# Patient Record
Sex: Male | Born: 1945 | Race: White | Hispanic: No | Smoking: Former smoker
Health system: Southern US, Community
[De-identification: ages and names within clinical notes are randomized; demographics above are authoritative.]

## PROBLEM LIST (undated history)

## (undated) DIAGNOSIS — J449 Chronic obstructive pulmonary disease, unspecified: Secondary | ICD-10-CM

## (undated) DIAGNOSIS — I2510 Atherosclerotic heart disease of native coronary artery without angina pectoris: Secondary | ICD-10-CM

## (undated) DIAGNOSIS — Z952 Presence of prosthetic heart valve: Secondary | ICD-10-CM

## (undated) DIAGNOSIS — I6529 Occlusion and stenosis of unspecified carotid artery: Secondary | ICD-10-CM

## (undated) DIAGNOSIS — I10 Essential (primary) hypertension: Secondary | ICD-10-CM

## (undated) DIAGNOSIS — D751 Secondary polycythemia: Secondary | ICD-10-CM

## (undated) DIAGNOSIS — R001 Bradycardia, unspecified: Secondary | ICD-10-CM

## (undated) DIAGNOSIS — I255 Ischemic cardiomyopathy: Secondary | ICD-10-CM

## (undated) DIAGNOSIS — Z95810 Presence of automatic (implantable) cardiac defibrillator: Secondary | ICD-10-CM

## (undated) DIAGNOSIS — Z5189 Encounter for other specified aftercare: Secondary | ICD-10-CM

## (undated) DIAGNOSIS — N183 Chronic kidney disease, stage 3 unspecified: Secondary | ICD-10-CM

## (undated) DIAGNOSIS — R55 Syncope and collapse: Secondary | ICD-10-CM

## (undated) DIAGNOSIS — I452 Bifascicular block: Secondary | ICD-10-CM

## (undated) DIAGNOSIS — I38 Endocarditis, valve unspecified: Secondary | ICD-10-CM

## (undated) DIAGNOSIS — Z8679 Personal history of other diseases of the circulatory system: Secondary | ICD-10-CM

## (undated) DIAGNOSIS — N401 Enlarged prostate with lower urinary tract symptoms: Secondary | ICD-10-CM

## (undated) DIAGNOSIS — Z940 Kidney transplant status: Secondary | ICD-10-CM

## (undated) DIAGNOSIS — E785 Hyperlipidemia, unspecified: Secondary | ICD-10-CM

## (undated) DIAGNOSIS — M1A9XX0 Chronic gout, unspecified, without tophus (tophi): Secondary | ICD-10-CM

## (undated) DIAGNOSIS — H409 Unspecified glaucoma: Secondary | ICD-10-CM

## (undated) DIAGNOSIS — N2581 Secondary hyperparathyroidism of renal origin: Secondary | ICD-10-CM

## (undated) DIAGNOSIS — M1990 Unspecified osteoarthritis, unspecified site: Secondary | ICD-10-CM

## (undated) DIAGNOSIS — E119 Type 2 diabetes mellitus without complications: Secondary | ICD-10-CM

## (undated) DIAGNOSIS — N138 Other obstructive and reflux uropathy: Secondary | ICD-10-CM

## (undated) HISTORY — PX: TRANSTHORACIC ECHOCARDIOGRAM: SHX275

## (undated) HISTORY — DX: Chronic obstructive pulmonary disease, unspecified: J44.9

## (undated) HISTORY — DX: Personal history of other diseases of the circulatory system: Z86.79

## (undated) HISTORY — DX: Other obstructive and reflux uropathy: N40.1

## (undated) HISTORY — DX: Secondary polycythemia: D75.1

## (undated) HISTORY — DX: Chronic kidney disease, stage 3 unspecified: N18.30

## (undated) HISTORY — DX: Unspecified osteoarthritis, unspecified site: M19.90

## (undated) HISTORY — DX: Essential (primary) hypertension: I10

## (undated) HISTORY — DX: Syncope and collapse: R55

## (undated) HISTORY — DX: Chronic kidney disease, stage 3 (moderate): N18.3

## (undated) HISTORY — DX: Kidney transplant status: Z94.0

## (undated) HISTORY — DX: Ischemic cardiomyopathy: I25.5

## (undated) HISTORY — DX: Type 2 diabetes mellitus without complications: E11.9

## (undated) HISTORY — DX: Benign prostatic hyperplasia with lower urinary tract symptoms: N13.8

## (undated) HISTORY — DX: Presence of automatic (implantable) cardiac defibrillator: Z95.810

## (undated) HISTORY — DX: Presence of prosthetic heart valve: Z95.2

## (undated) HISTORY — DX: Bradycardia, unspecified: R00.1

## (undated) HISTORY — PX: OTHER SURGICAL HISTORY: SHX169

## (undated) HISTORY — DX: Atherosclerotic heart disease of native coronary artery without angina pectoris: I25.10

## (undated) HISTORY — DX: Secondary hyperparathyroidism of renal origin: N25.81

## (undated) HISTORY — DX: Bifascicular block: I45.2

## (undated) HISTORY — DX: Unspecified glaucoma: H40.9

## (undated) HISTORY — DX: Chronic gout, unspecified, without tophus (tophi): M1A.9XX0

## (undated) HISTORY — DX: Hyperlipidemia, unspecified: E78.5

## (undated) HISTORY — DX: Encounter for other specified aftercare: Z51.89

## (undated) HISTORY — DX: Occlusion and stenosis of unspecified carotid artery: I65.29

---
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
## 1986-06-18 DIAGNOSIS — Z940 Kidney transplant status: Secondary | ICD-10-CM

## 1986-06-18 HISTORY — DX: Kidney transplant status: Z94.0

## 1993-06-18 DIAGNOSIS — Z952 Presence of prosthetic heart valve: Secondary | ICD-10-CM

## 1993-06-18 HISTORY — DX: Presence of prosthetic heart valve: Z95.2

## 1993-06-18 HISTORY — PX: CORONARY ARTERY BYPASS GRAFT: SHX141

## 1993-06-18 HISTORY — PX: AORTIC VALVE REPLACEMENT: SHX41

## 1993-06-18 HISTORY — PX: CARDIAC CATHETERIZATION: SHX172

## 1996-09-10 HISTORY — PX: KIDNEY TRANSPLANT: SHX239

## 1997-09-22 ENCOUNTER — Other Ambulatory Visit: Admission: RE | Admit: 1997-09-22 | Discharge: 1997-09-22 | Payer: Self-pay | Admitting: Nephrology

## 1997-09-27 ENCOUNTER — Other Ambulatory Visit: Admission: RE | Admit: 1997-09-27 | Discharge: 1997-09-27 | Payer: Self-pay | Admitting: Nephrology

## 1997-10-12 ENCOUNTER — Other Ambulatory Visit: Admission: RE | Admit: 1997-10-12 | Discharge: 1997-10-12 | Payer: Self-pay | Admitting: Nephrology

## 1997-10-21 ENCOUNTER — Other Ambulatory Visit: Admission: RE | Admit: 1997-10-21 | Discharge: 1997-10-21 | Payer: Self-pay | Admitting: Nephrology

## 1997-10-28 ENCOUNTER — Other Ambulatory Visit: Admission: RE | Admit: 1997-10-28 | Discharge: 1997-10-28 | Payer: Self-pay | Admitting: Nephrology

## 1997-11-22 ENCOUNTER — Other Ambulatory Visit: Admission: RE | Admit: 1997-11-22 | Discharge: 1997-11-22 | Payer: Self-pay | Admitting: *Deleted

## 1997-12-10 ENCOUNTER — Other Ambulatory Visit: Admission: RE | Admit: 1997-12-10 | Discharge: 1997-12-10 | Payer: Self-pay | Admitting: Nephrology

## 1998-01-07 ENCOUNTER — Other Ambulatory Visit: Admission: RE | Admit: 1998-01-07 | Discharge: 1998-01-07 | Payer: Self-pay | Admitting: *Deleted

## 1998-01-14 ENCOUNTER — Other Ambulatory Visit: Admission: RE | Admit: 1998-01-14 | Discharge: 1998-01-14 | Payer: Self-pay | Admitting: Nephrology

## 2000-03-19 ENCOUNTER — Inpatient Hospital Stay (HOSPITAL_COMMUNITY): Admission: RE | Admit: 2000-03-19 | Discharge: 2000-03-26 | Payer: Self-pay

## 2001-02-11 ENCOUNTER — Encounter: Admission: RE | Admit: 2001-02-11 | Discharge: 2001-02-11 | Payer: Self-pay | Admitting: Nephrology

## 2001-02-11 ENCOUNTER — Encounter: Payer: Self-pay | Admitting: Nephrology

## 2002-11-11 ENCOUNTER — Encounter: Payer: Self-pay | Admitting: Nephrology

## 2002-11-11 ENCOUNTER — Encounter: Admission: RE | Admit: 2002-11-11 | Discharge: 2002-11-11 | Payer: Self-pay | Admitting: Nephrology

## 2008-12-04 ENCOUNTER — Inpatient Hospital Stay (HOSPITAL_COMMUNITY): Admission: EM | Admit: 2008-12-04 | Discharge: 2008-12-12 | Payer: Self-pay | Admitting: Emergency Medicine

## 2008-12-04 ENCOUNTER — Ambulatory Visit: Payer: Self-pay | Admitting: Diagnostic Radiology

## 2008-12-04 ENCOUNTER — Encounter: Payer: Self-pay | Admitting: Emergency Medicine

## 2008-12-04 ENCOUNTER — Ambulatory Visit: Payer: Self-pay | Admitting: Internal Medicine

## 2008-12-07 ENCOUNTER — Encounter: Payer: Self-pay | Admitting: Cardiology

## 2010-02-13 ENCOUNTER — Ambulatory Visit: Payer: Self-pay | Admitting: Cardiology

## 2010-09-01 ENCOUNTER — Ambulatory Visit: Payer: Self-pay | Admitting: Cardiology

## 2010-09-18 ENCOUNTER — Encounter: Payer: Self-pay | Admitting: Cardiology

## 2010-09-18 ENCOUNTER — Ambulatory Visit (INDEPENDENT_AMBULATORY_CARE_PROVIDER_SITE_OTHER): Payer: Self-pay | Admitting: Cardiology

## 2010-09-18 VITALS — BP 120/60 | HR 60 | Wt 199.0 lb

## 2010-09-18 DIAGNOSIS — Z952 Presence of prosthetic heart valve: Secondary | ICD-10-CM

## 2010-09-18 DIAGNOSIS — I359 Nonrheumatic aortic valve disorder, unspecified: Secondary | ICD-10-CM

## 2010-09-19 NOTE — Progress Notes (Signed)
Office Note   Name: John Barton Date of birth: [birth year] Date of service: 09/18/10  History of present illness: This pleasant middle-aged gentleman is seen for a six-month followup office visit.  He has a complex past medical history.  He does have known coronary artery disease.  He underwent CABG to his right coronary artery and to the LAD in 1995 he also underwent replacement of his aortic valve at that time was a St. Jude mechanical valve prosthesis.  In June 2010 he was hospitalized with paroxysmal recurrent supraventricular tachycardia with a wide complex.  He has improved on Quinaglute which is tolerated well.  He's had no recurrent tachycardia.  He's having no dizziness or syncope.  He's not having any chest pain or shortness of breath.  Medications: Reviewed in detail  Physical exam: Weight is 199, down 4 pounds.  The blood pressure is stable.  Pulse is 61.  EKG today confirms normal sinus rhythm with first-degree heart block and bifascicular block and slightly prolonged QTc interval at 459 ms. Pupils equal and reactive.   Extraocular Movements are full.  There is no scleral icterus.  The mouth and pharynx are normal.  The neck is supple.  The carotids reveal no bruits.  The jugular venous pressure is normal.  The thyroid is not enlarged.  There is no lymphadenopathy. The chest is clear to percussion and auscultation. There are no rales or rhonchi. Expansion of the chest is symmetrical. The precordium is quiet.  The first heart sound is normal.  The second heart sound is physiologically split.  There is no gallop.  Patient has good opening closing aortic valve clicks.  No aortic insufficiency murmur.  There is no abnormal lift or heave. The abdomen is soft and nontender. Bowel sounds are normal. The liver and spleen are not enlarged. There Are no abdominal masses. There are no bruits. The pedal pulses are good.  There is no phlebitis or edema.  There is no cyanosis or clubbing.  Impression  : Ischemic heart disease status post CABG St. Jude aortic valve prosthesis. Past history of supraventricular tachycardia Bifascicular block Status post kidney transplant for end-stage renal disease, doing well History of dyslipidemia  Plan: Continue same medications recheck in 6 months for followup office visit.   Signed by Darlin Coco, M.D.  cc:[default value]

## 2010-09-25 LAB — URINALYSIS, ROUTINE W REFLEX MICROSCOPIC
Glucose, UA: NEGATIVE mg/dL
Specific Gravity, Urine: 1.015 (ref 1.005–1.030)
pH: 6 (ref 5.0–8.0)

## 2010-09-25 LAB — CBC
HCT: 42.9 % (ref 39.0–52.0)
Hemoglobin: 14.6 g/dL (ref 13.0–17.0)
MCHC: 34.1 g/dL (ref 30.0–36.0)
RDW: 13.6 % (ref 11.5–15.5)

## 2010-09-25 LAB — PROTIME-INR
INR: 2.3 — ABNORMAL HIGH (ref 0.00–1.49)
INR: 2.3 — ABNORMAL HIGH (ref 0.00–1.49)
INR: 3.3 — ABNORMAL HIGH (ref 0.00–1.49)
INR: 3.8 — ABNORMAL HIGH (ref 0.00–1.49)
INR: 3.3 — ABNORMAL HIGH (ref 0.00–1.49)
Prothrombin Time: 36.5 seconds — ABNORMAL HIGH (ref 11.6–15.2)
Prothrombin Time: 39.1 seconds — ABNORMAL HIGH (ref 11.6–15.2)
Prothrombin Time: 35.8 seconds — ABNORMAL HIGH (ref 11.6–15.2)

## 2010-09-25 LAB — DIFFERENTIAL
Basophils Absolute: 0.1 10*3/uL (ref 0.0–0.1)
Basophils Relative: 2 % — ABNORMAL HIGH (ref 0–1)
Eosinophils Relative: 0 % (ref 0–5)
Lymphocytes Relative: 5 % — ABNORMAL LOW (ref 12–46)
Monocytes Absolute: 0.6 10*3/uL (ref 0.1–1.0)
Monocytes Relative: 7 % (ref 3–12)

## 2010-09-25 LAB — BASIC METABOLIC PANEL
CO2: 29 mEq/L (ref 19–32)
CO2: 28 mEq/L (ref 19–32)
Calcium: 9.5 mg/dL (ref 8.4–10.5)
Chloride: 104 mEq/L (ref 96–112)
Chloride: 103 mEq/L (ref 96–112)
Creatinine, Ser: 1.41 mg/dL (ref 0.4–1.5)
GFR calc Af Amer: >60 mL/min (ref >60)
Glucose, Bld: 107 mg/dL — ABNORMAL HIGH (ref 70–99)
Glucose, Bld: 139 mg/dL — ABNORMAL HIGH (ref 70–99)
Potassium: 3.9 mEq/L (ref 3.5–5.1)
Potassium: 4.2 mEq/L (ref 3.5–5.1)
Sodium: 137 mEq/L (ref 135–145)
Sodium: 140 mEq/L (ref 135–145)

## 2010-09-25 LAB — T4: T4, Total: 6.7 ug/dL (ref 5.0–12.5)

## 2010-09-25 LAB — POCT CARDIAC MARKERS
Myoglobin, poc: 101 ng/mL (ref 12–200)
Troponin i, poc: <0.05 ng/mL (ref 0.00–0.09)

## 2010-09-25 LAB — LIPID PANEL
Cholesterol: 150 mg/dL (ref 0–200)
Total CHOL/HDL Ratio: 5.4 RATIO

## 2010-09-25 LAB — COMPREHENSIVE METABOLIC PANEL
ALT: 24 U/L (ref 0–53)
AST: 26 U/L (ref 0–37)
Albumin: 3.8 g/dL (ref 3.5–5.2)
Alkaline Phosphatase: 83 U/L (ref 39–117)
BUN: 31 mg/dL — ABNORMAL HIGH (ref 6–23)
Chloride: 103 mEq/L (ref 96–112)
GFR calc Af Amer: 47 mL/min — ABNORMAL LOW (ref >60)
Potassium: 4.9 mEq/L (ref 3.5–5.1)
Sodium: 138 mEq/L (ref 135–145)
Total Bilirubin: 0.7 mg/dL (ref 0.3–1.2)

## 2010-09-25 LAB — MAGNESIUM: Magnesium: 2 mg/dL (ref 1.5–2.5)

## 2010-09-25 LAB — BRAIN NATRIURETIC PEPTIDE: Pro B Natriuretic peptide (BNP): 72 pg/mL (ref 0.0–100.0)

## 2010-09-25 LAB — TROPONIN I: Troponin I: 0.04 ng/mL (ref 0.00–0.06)

## 2010-09-25 LAB — CK TOTAL AND CKMB (NOT AT ARMC): Relative Index: 1.9 (ref 0.0–2.5)

## 2010-09-25 LAB — URINE MICROSCOPIC-ADD ON

## 2010-09-25 LAB — URIC ACID: Uric Acid, Serum: 6.4 mg/dL (ref 4.0–7.8)

## 2010-10-12 ENCOUNTER — Encounter: Payer: Self-pay | Admitting: Nephrology

## 2010-10-31 NOTE — Consult Note (Signed)
NAME:  John Barton, John Barton NO.:  [id number]   MEDICAL RECORD NO.:  [id number]          PATIENT TYPE:  INP   LOCATION:  2004                         FACILITY:  [facility]   PHYSICIAN:  Deboraha Sprang, MD, FACCDATE OF BIRTH:  09-May-1946   DATE OF CONSULTATION:  12/06/2008  DATE OF DISCHARGE:                                 CONSULTATION   Thank you very much for asking [place] to see in consultation, John Barton because of episodes of wide-complex tachycardia.   John Barton is a 65 year old gentleman with a complex past medical  history including a history of renal failure status post living related  donor transplantation, currently on CellCept; aortic valve replacement  for aortic stenosis with which, the patient says he also had a single-  vessel bypass; history of normal left ventricular function, who over the  last number of weeks started having spells of abrupt onset  lightheadedness that were quite transient lasting less than 5 seconds.  He thought they were initially related to hypoglycemia.  They persisted  infrequently until Friday.  He had mowed the lawn for about an hour  prior to this.  He then had a series of  a half dozen spells occurring  while upright and while seated having eaten and not having eaten.  Because of persistent symptoms on Saturday, he went to the [facility]  Emergency Room at [facility] where clinical correlation of these  symptoms was associated with runs of tachycardia.  The strips of which  are not available.  15-20 beats are described and these episodes  recurred in the hospital here at [facility], as recently as this morning.   He has no significant palpitations associated with it.  The episodes in  hospital here lasted 15-20 beats.   Initially, he was treated with amiodarone and beta-blockers.  Unfortunately, this was associated with heart rates in the 40s and the  amiodarone was then discontinued and Lopressor was continued.   Other  medications include:  1. Lisinopril 10.  2. CellCept 1000 b.i.d.  3. Warfarin.  4. Lasix 40.  5. Allopurinol 100/200.  6. Lipitor 80.   He has no known drug allergies.   SOCIAL HISTORY:  He is divorced.  He lives by himself.  He does not use  recreational drugs or cigarettes.  He does drink alcohol occasionally.   His family history is noncontributory.   His review of systems is noted on the intake sheet and apart from the  HPI and PMH is broadly negative.   PHYSICAL EXAMINATION:  GENERAL:  He is an older Caucasian male appearing  somewhat older than his stated age of 66.  VITAL SIGNS:  His blood pressure is 120/65 with a pulse of 48.  His  respirations were 16 and unlabored.  HEENT:  Demonstrated no icterus or xanthomata.  NECK:  His neck veins were flat with bilaterally transmitted bruits  presumed).  BACK:  Without kyphosis or scoliosis.  LUNGS:  Clear.  HEART:  Sounds were regular from mechanical S2 and a 2/6 murmur heard  along the right upper sternal  border.  ABDOMEN:  Soft with active bowel sounds without midline pulsation or  hepatomegaly.  EXTREMITIES:  Femoral pulses were 2+.  Distal pulses were intact.  There  was no clubbing, cyanosis, or edema.  SKIN:  Warm and dry.  NEUROLOGIC:  Grossly normal.   Telemetry strips are as recorded above.  Interestingly, there is a  change in the appearance of his inferior leads during tachycardia with a  further widening suggesting an aberration perhaps in the left anterior  fascicle to match the right bundle branch block.   Electrocardiogram at rest dated December 04, 2008, demonstrated sinus rhythm  at 72 with intervals of 0.20/0.15/0.46.   IMPRESSION:  1. Atrial tachycardia - nonsustained with some degree of rate-related      aberration probably in the left anterior fascicle.  2. Presyncope associated with atrial tachycardia likely secondary to      vasodepression, mediated autonomically.  3. Modest bradycardia associated  with amiodarone.  4. Right bundle branch block.  5. Borderline QT prolongation.  6. Status post aortic valve replacement and question one-vessel      coronary artery bypass grafting.  7. Normal left ventricular function.  8. Status post living related donor renal transplant with a glomerular      filtration rate of 56.   DISCUSSION:  John Barton has vasomotor instability triggered by atrial  tachycardia.  The therapeutic target needs to be the arrhythmia.  Given  his relative infrequent, I am not sure that catheter ablation makes  sense as a first approach, although medical options are not great.  The  first point of clarification has to be whether he does in fact have  coronary artery disease, as IC agent might be particularly useful here.  Myoview scanning would be helpful in the event that he does not have  declared coronary artery disease.   In the event that he does, then the options are much more limited.  Both  amiodarone and Dronedarone are associated with potential for  bradycardia.  Sotalol and Tikosyn both are of some concern given his  borderline QT interval.  It does not appear that beta-blockers in  themselves will be sufficient.  Alternatively, a calcium blocker might  be.  There will be a question, I would like to have Dr. Jimmy Footman to  make sure there is renal contraindications to such use.   A lot hinges, and certainly the ease of the decision hinges on whether  he does have coronary artery disease or not, so I would for now  recommend:  1. Clarifying the history of his surgery.  2. Undertake Myoview scanning.  3. Consider IC therapy.  4. In the event that one tolerate this would probably favor Tikosyn.  5. We will review with colleagues the value of potentially ablating      this.  Endocardial mapping with the ESI array might facilitate      mapping of the very short bursts.   Thank you for the consultation.      Deboraha Sprang, MD, [facility]  Electronically  Signed     SCK/MEDQ  D:  12/06/2008  T:  12/07/2008  Job:  [id number]   cc:   Darlin Coco, M.D.  James L. Deterding, M.D.

## 2010-10-31 NOTE — H&P (Signed)
NAME:  John Barton, CRASK NO.:  [id number]   MEDICAL RECORD NO.:  [id number]          PATIENT TYPE:  INP   LOCATION:  2922                         FACILITY:  [place]   PHYSICIAN:  Darlin Coco, M.D. DATE OF BIRTH:  1946/02/19   DATE OF ADMISSION:  12/04/2008  DATE OF DISCHARGE:                              HISTORY & PHYSICAL   CHIEF COMPLAINT:  Dizzy spells.   HISTORY:  This is a 65 year old Caucasian male who is admitted with  recurrent long 20-beat runs of wide complex tachycardia associated with  dizziness and near syncope.  He has been experiencing similar spells  over the past several months, but felt that they were just attributed to  low blood sugar attacks, and did not pursue them.  Yesterday, he did  some yard work.  He drank plenty of Gatorade while he was working to  prevent dehydration.  Approximately 2 hours after he had finished  working, he was preparing supper and began having episodes of waves of  dizziness.  He estimates that between supper time and bedtime, he had  about 7 such episodes.  He went to bed.  There was no chest pain with  any of these.  He was not short of breath.  He was not diaphoretic or  nauseated.  This morning, he had another episode and decided to go to  the emergency room at the [facility].  He was worked up there  and telemetry showed that he was having recurrent episodes of wide  complex tachycardia at a rate of 150 per minute.  The QRS morphology was  the same morphology as his native beats however.  He was felt to be  having V-TACH and was sent to [facility] where he was evaluated and admitted.  All of his labs at the outside emergency room were normal including  therapeutic INR, cardiac enzymes, electrolytes, magnesium.  Chest x-ray  was also unremarkable except for mild cardiomegaly.  His  electrocardiogram did not show any acute ST-T wave abnormalities, but he  does have a bifascicular block.   PAST MEDICAL  HISTORY:  He had a St. Jude aortic valve replacement for  aortic stenosis on Oct 25, 1993.  He had kidney transplant for chronic  end-stage renal disease on September 10, 1996.  He is followed by Dr.  Jimmy Footman.  He had parathyroid surgery in 2001.   HOME MEDICATIONS:  1. Allopurinol 100 mg 3 daily.  2. Calcium 500 mg daily.  3. Lasix 40 mg daily.  4. Lipitor 80 mg daily.  5. Lisinopril 10 mg daily.  6. CellCept 500 mg 2 twice a day.  7. Calcitriol 0.5 mg daily.   FAMILY HISTORY:  His mother is living and well.  His father died of  heart failure at 47.   SOCIAL HISTORY:  He does not use any cigarettes.  He has an occasional  cigar and drinks an occasional beer.   REVIEW OF SYSTEMS:  Otherwise negative in detail.  He is not having any  new gastrointestinal or genitourinary symptoms.  No cough or sputum  production or  hemoptysis or pleurisy.   He has no known drug allergies.   PHYSICAL EXAMINATION:  VITAL SIGNS:  Blood pressure is 196/80, pulse is  80 and normal sinus rhythm.  He is now on an IV amiodarone drip and he  has not had any further SVT or wide complex tachycardia.  GENERAL APPEARANCE:  Apprehensive, somewhat frightened middle-aged  gentleman in no acute distress.  He is deeply tanned.  HEENT:  Unremarkable.  Pupils equal and reactive.  Sclerae nonicteric.  Mouth and pharynx negative.  Carotids negative.  Jugular venous pressure  normal.  CHEST:  Clear to percussion and auscultation.  HEART:  Grade 2/6 systolic ejection murmur at the base.  ABDOMEN:  Soft and nontender.  No hepatomegaly.  EXTREMITIES:  No edema.  He has 1+ pedal pulses.  NEUROLOGIC:  Physiologic.  SKIN:  Negative for rash.   As noted already, the outside labs were normal including cardiac  enzymes.   Chest x-ray shows cardiomegaly, but clear lungs.   His EKG shows normal sinus rhythm with a pattern of a right bundle-  branch block and a left anterior hemiblock.   IMPRESSION:  1. Paroxysmal wide  complex tachycardia, possibly supraventricular      tachycardia versus ventricular tachycardia.  These episodes are      associated with feelings of dizziness, weakness, and near syncope.  2. Hypertensive cardiovascular disease.  3. Bifascicular block.  4. Status post renal transplant.  5. Status post St. Jude aortic valve replacement.  6. Chronic Coumadin anticoagulation.   DISPOSITION:  Admit to stepdown unit.  IV amiodarone was started in the  emergency room and will continue it overnight.  We are going to add beta-  blocker to his regimen to help with systolic hypertension as well as to  help with his arrhythmia.  We will get serial enzymes.  We will strongly  consider EP consult when available.           ______________________________  Darlin Coco, M.D.     TB/MEDQ  D:  12/04/2008  T:  12/05/2008  Job:  [id number]   cc:   Dr. Jimmy Footman

## 2010-11-03 NOTE — Discharge Summary (Signed)
NAME:  John Barton, John Barton NO.:  [id number]   MEDICAL RECORD NO.:  [id number]          PATIENT TYPE:  INP   LOCATION:  2004                         FACILITY:  [place]   PHYSICIAN:  Darlin Coco, M.D. DATE OF BIRTH:  [birth year]   DATE OF ADMISSION:  12/04/2008  DATE OF DISCHARGE:  12/12/2008                               DISCHARGE SUMMARY   FINAL DIAGNOSES:  1. Paroxysmal supraventricular tachycardia with aberration.  2. Dizziness and near syncope secondary to arrhythmia.  3. Status post kidney transplant with normal kidney function.  4. Status post St. Jude aortic valve replacement and coronary artery      bypass graft surgery on Oct 25, 1993.  5. Hypertensive cardiovascular disease.  6. Bifascicular block.  7. 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   OPERATIONS PERFORMED:  Echocardiography and stress Myoview.   HISTORY:  This is a 65 year old Caucasian male who was admitted through  the emergency room on December 04, 2008.  He came in after experiencing a  long 20-beat runs of wide complex tachycardia associated with dizziness  and near syncope.  He has been experiencing similar spells over the past  several months, which he had been attributing to possible low blood  sugar attacks.  On the day prior to admission, he did some yard work and  although he was drinking plenty of Gatorade to prevent dehydration;  after working 2 hours, he began having waves of severe dizziness as he  was preparing supper.  He had about seven such episodes between supper  and bedtime.  He did not have any chest pain and he was not short of  breath.  There was no nausea or diaphoresis.  On the morning of  admission, he had another episode, and he went to emergency room on [address] where he was found to be having recurrent episodes of wide  complex tachycardia at a rate of 150 per minute.  He was thought at that  time to be having ventricular  tachycardia and was sent to [facility] Emergency  Room where he was evaluated and admitted.  Subsequent evaluation  revealed that the QRS morphology is the same morphology as his native  beats suggesting that this is not ventricular tachycardia but merely  supraventricular tachycardia with a preexisting interventricular  conduction disturbance.  Chest x-ray in the emergency room showed mild  cardiomegaly.  Of note is the fact that he had St. Jude aortic valve  replacement for aortic stenosis as well as coronary artery bypass graft  surgery on Oct 25, 1993.  He had kidney transplant successfully for end-  stage renal disease on September 10, 1996.  In 2001, he had parathyroid  surgery.   HOME MEDICATIONS AT THE TIME OF ADMISSION:  1. Allopurinol 100 mg three times a day.  2. Calcium 500 mg daily.  3. Furosemide 40 mg daily.  4. Lipitor 80 mg daily.  5. Lisinopril 10 mg daily.  6. CellCept 500 mg two twice a day.  7. Calcitriol 0.5 mg daily.   PHYSICAL EXAMINATION:  VITAL SIGNS:  Initially, his blood pressure is  196/80, pulse is 80 in normal sinus rhythm.  GENERAL:  This is an apprehensive gentleman in no distress.  HEENT:  Head and neck unremarkable.  CHEST:  Clear.  HEART:  Good opening and closing aortic valve clicks with a grade 2/6  systolic ejection murmur at the base.  ABDOMEN:  Soft and nontender.  EXTREMITIES:  No phlebitis.  He has 1+ pedal pulses.  NEUROLOGIC:  Physiologic.   Chest x-ray shows cardiomegaly, but clear lung fields.   EKG shows a pattern of normal sinus rhythm with a right bundle-branch  block with a left anterior hemiblock.   HOSPITAL COURSE:  The patient was admitted to step-down.  He had been  started on IV amio in the emergency room, which was continued overnight.  However, the IV amio had to be stopped because of subsequent marked  sinus bradycardia.  Because of his sinus bradycardia, we also cut back  on his Lopressor dose.  Cardiac enzymes were negative.   B-natriuretic  peptide was normal.  His INR was therapeutic at 3.3.  Thyroid function  was normal and his hemoglobin A1c was normal.  He was transferred to  telemetry on December 05, 2008.  On the morning of December 06, 2008, he had  another run of wide complex tachycardia.  Dr. Jolyn Nap was asked to  see the patient for EP consult.  Dr. Caryl Comes thought that it was important  to clarify whether the patient was having any ischemic heart disease.  Old records revealed that he had had saphenous vein graft to the right  coronary artery and to the LAD at the time of his aortic valve  replacement.  We did a stress Myoview on December 07, 2008.  The patient  walked 5 minutes 45 seconds, did not have any chest pain and had only  moderate dyspnea, and he did not have any arrhythmias during the test.  Subsequent images showed no reversible ischemia and his ejection  fraction was 47%.  A two-dimensional echocardiogram showed good left  ventricular systolic function with an EF of 50-55% and his prosthetic  aortic valve was functioning normally.  We initially added Rythmol to  his regimen.  However, it was felt that the Rythmol would probably not  be the drug of choice for long term because of history of prior coronary  artery disease and his history of bradycardia.  Therefore, the patient  was felt to be a candidate for Tikosyn or Quinaglute.  Because of major  financial concerns, we elected to go with Quinaglute 324 twice a day.  The patient tolerated that dose well and was observed over the next 72  hours, and he had no further arrhythmias and was having no side effects  and was able to be discharged home improved.   DISCHARGE MEDICATIONS:  Low-sodium heart-healthy diet.  He will be on  lisinopril 10 mg one daily; CellCept 500 mg 2 tablets twice a day;  calcium daily; furosemide 40 mg daily; calcitriol 0.5 mg daily;  allopurinol 100 mg daily; Lipitor 80 mg daily; Coumadin 7.5 mg Monday,  Wednesday, Friday,  and Sunday and 10 mg Tuesday, Thursday, and Saturday.  He is no longer on metoprolol.  He is starting on Quinaglute 324 one  twice a day.   Condition on discharge is improved.   The patient will be followed up in 1 week in Dr. Sherryl Barters office.  ______________________________  Darlin Coco, M.D.     TB/MEDQ  D:  01/17/2009  T:  01/18/2009  Job:  [id number]   cc:   Deboraha Sprang, MD, [facility]  Dr. Jimmy Footman

## 2010-11-03 NOTE — Op Note (Signed)
[place]  Patient:    John Barton, John Barton                        MRN: [id number] Proc. Date: 03/21/00 Adm. Date:  [id number] Attending:  Allyn Kenner CC:         Joyice Faster. Deterding, M.D.   Operative Report  CENTRAL [place] NUMBER:  [id number]  PREOPERATIVE DIAGNOSIS:  Tertiary hyperparathyroidism.  POSTOPERATIVE DIAGNOSIS:  Tertiary hyperparathyroidism.  OPERATION:  Total parathyroidectomy and autotransplantation of parathyroid tissue to the left brachioradialis muscle.  SURGEON:  Jaci Carrel, M.D.  ASSISTANT:  Edsel Petrin. Dalbert Batman, M.D.  ANESTHESIA:  General endotracheal.  DESCRIPTION OF PROCEDURE:  Under adequate general endotracheal anesthesia the patients neck was prepared and draped in the usual fashion.  A low collar incision was made and carried down through the platysma muscle. Superior and inferior platysma flaps were developed using Bovie electrocoagulation.  The middle cervical fascia was exposed.  A Mahorner self-retaining retractor was inserted.  The middle cervical fascia was incised longitudinally in the midline and the strap muscles were divided bilaterally in the upper portions using Bovie electrocoagulation.  The surgical plane of the thyroid was entered.  The left side was approached first.  The left lobe of the thyroid was dissected away from the carotid sheath and rotated anteriorly and medially.  Exploration of the left side revealed 2 enlarged parathyroid glands in their usual positions. They were each removed over small hemoclips.  The clipped ______ parathyroid gland was 1.6 cm in length, 1.0 cm in width, and 0.2 cm in thickness.  A portion was sent for frozen section study which confirmed parathyroid tissue and the remainder was saved in iced saline.  The left superior gland was 1.5 cm in length, 0.6 cm in width, and 0.4 cm in thickness.  A portion was sent for frozen section study and the remainder was  saved in iced saline.  There did not appear to be any more parathyroid tissue on the left side and the left mediastinum was unable to be explored without considerable dissection because of the patients previous coronary artery bypass surgery.  We then went over to the right side, and divided a large middle thyroid vein in order to give mobility to the right lobe of the thyroid.  The right inferior parathyroid gland was quite a bit enlarged but in its usual position. It was dissected out over small hemoclips and removed.  The right inferior was 2 cm in length, 1.2 in width, and 0.8 cm in thickness.  A portion was sent for frozen section study which confirmed parathyroid tissue and the remainder was saved in iced saline.  The right superior was enlarged and the major component of it was posterior but it was attached in its usual position just above the inferior thyroid artery.  The right recurrent laryngeal nerve was identified and it was actually crossing the inferior artery anterior to it.  The right superior was dissected out and its blood supply was divided over small hemoclips.  It was surgically removed and it was 1.2 cm in length, 0.7 cm in width, and 0.2 cm in thickness.  A portion was sent for frozen section study and remainder was saved in iced saline.  At this point we had found 4 enlarged parathyroid glands.  There continued to be a little bulge in the right superior anterior mediastinum and a small nodule was dissected out and most likely was  a lymph node.  It was sent for routine pathologic study.  Hemostasis was ascertained.  Both sides of dissection were irrigated with sterile saline solution until clear.  The strap muscles were repaired with interrupted sutures of 4-0 Vicryl.  The platysma muscle was also reapproximated with interrupted sutures of 4-0 Vicryl and the skin was approximated with a generic skin staple 35-W.  Sterile dressing was applied.  The left forearm  was prepared and draped in the usual fashion for autotransplantation.  A 5 cm longitudinal incision was made overlying the left brachioradialis muscle.  Incision was carried down to the brachioradialis muscle and an area overlying the muscle measuring 5 cm x 5 cm was dissected out.  There was a peripheral nerve coming through the central portion of the wound and it was protected during the procedure.  Next, the left superior parathyroid gland was dissected into 12 small pieces approximately 1-2 mm in diameter each.  Each portion of the parathyroid tissue was then inserted into a separate intramuscular pocket in the brachioradialis muscle.  Each individual pocket was closed with 4-0 Prolene.  Hemostasis was ascertained.  The skin was reapproximated with generic skin staple 35-W. Sterile dressing was applied.  A pressure dressing with an Ace bandage was applied to the left forearm.  The patient tolerated the procedure well and left the operating room in satisfactory condition. DD:  03/21/00 TD:  03/21/00 Job: [id number]

## 2010-11-03 NOTE — Discharge Summary (Signed)
. [facility]  Patient:    John Barton, John Barton                        MRN: [id number] Adm. Date:  [id number] Disc. Date: [id number] Attending:  Allyn Kenner CC:         Joyice Faster. Deterding, M.D.   Discharge Summary  CENTRAL [place] NUMBER:  [id number]  FINAL DIAGNOSES: 1. Tertiary hyperparathyroidism. 2. Status post renal transplant. 3. Hypertension. 4. Prosthetic aortic valve. 5. Status active anticoagulation with Coumadin - interrupted on March 15, 2000. 6. Coronary artery disease, status post coronary artery bypass surgery.  HISTORY:  This 65 year old male patient developed renal failure following an myocardial infarction in 1994.  This persisted, and he required hemodialysis from 1994 to 1998, at which time he received a kidney transplant from his sister who was the donor.  The patient had secondary hyperparathyroidism which appeared to improve following the kidney transplant.  However, it continued to be present and the serum parathyroid hormone level had been rising recently. The patient denied symptoms of bone pain, itching, or fatigue.  He was also known to have a prosthetic aortic valve inserted in 1995 at the time of his coronary artery bypass surgery, and he had been on Coumadin since that time.  The patient discontinued the Coumadin three days prior to admission in order to get himself ready for total parathyroidectomy surgery.  PHYSICAL EXAMINATION:  Unremarkable.  The patient had a nonfunctioning Gore-Tex graft of the left forearm that had previously been used for dialysis access.  HOSPITAL COURSE:  On the day of admission the patient was begun immediately on heparin protocol because his prothrombin time had gotten down to 15, and his INR had gotten down to 1.4.  He tolerated the heparin protocol very well.  On March 21, 2000, he underwent a total parathyroidectomy in order to transplant the parathyroid tissue to his left  forearm.  Four glands were found and removed.  He tolerated the operation very well, and had a very uneventful postoperative course.  His heparin protocol was resumed on March 22, 2000, and he had no problems with bleeding or hematoma.  His voice was normal postoperatively, and his swallowing function was good.  He was begun back on Coumadin on the afternoon of March 22, 2000, and it took until March 26, 2000, for his prothrombin time to get up to 19.6 seconds and his INR to be 2.2.  His skin staples were removed from his neck on March 25, 2000, and the incision was healing nicely, and the arm staples continued to be present in the left forearm, and he was healing nicely.  He was discharged on March 26, 2000, for further care as an outpatient.  CONDITION ON DISCHARGE:  Much improved.  DIET:  Regular.  DISCHARGE MEDICATIONS: 1. Coumadin 7.5 mg daily. 2. Rocaltrol 0.5 mcg daily. 3. Calcium carbonate 2 tablets t.i.d. between meals.  FOLLOWUP:  He was to return to [facility] for calcium, phosphorus, and parathyroid hormone level on March 28, 2000, at 9:30 in the morning, and then have an appointment with Dr. Jimmy Footman on April 01, 2000. He was to return to see Dr. Leafy Kindle on April 03, 2000, for removal of the staples of his left forearm.DD:  03/26/00 TD:  03/27/00 Job: [id number]

## 2011-04-20 ENCOUNTER — Ambulatory Visit (INDEPENDENT_AMBULATORY_CARE_PROVIDER_SITE_OTHER): Payer: Medicare Other | Admitting: Cardiology

## 2011-04-20 ENCOUNTER — Encounter: Payer: Self-pay | Admitting: Cardiology

## 2011-04-20 VITALS — BP 120/70 | HR 64 | Ht 71.0 in | Wt 202.0 lb

## 2011-04-20 DIAGNOSIS — I471 Supraventricular tachycardia, unspecified: Secondary | ICD-10-CM | POA: Insufficient documentation

## 2011-04-20 DIAGNOSIS — I452 Bifascicular block: Secondary | ICD-10-CM | POA: Insufficient documentation

## 2011-04-20 DIAGNOSIS — I359 Nonrheumatic aortic valve disorder, unspecified: Secondary | ICD-10-CM

## 2011-04-20 DIAGNOSIS — Z951 Presence of aortocoronary bypass graft: Secondary | ICD-10-CM | POA: Insufficient documentation

## 2011-04-20 DIAGNOSIS — Z9889 Other specified postprocedural states: Secondary | ICD-10-CM

## 2011-04-20 DIAGNOSIS — I498 Other specified cardiac arrhythmias: Secondary | ICD-10-CM

## 2011-04-20 DIAGNOSIS — E785 Hyperlipidemia, unspecified: Secondary | ICD-10-CM

## 2011-04-20 DIAGNOSIS — Z952 Presence of prosthetic heart valve: Secondary | ICD-10-CM | POA: Insufficient documentation

## 2011-04-20 DIAGNOSIS — R002 Palpitations: Secondary | ICD-10-CM

## 2011-04-20 DIAGNOSIS — Z940 Kidney transplant status: Secondary | ICD-10-CM

## 2011-04-20 NOTE — Assessment & Plan Note (Signed)
He continues to do very well with his renal transplant.  His sister was his donor.  It was an excellent match.  The patient is not having any renal problems.  His Coumadin is monitored by nephrology

## 2011-04-20 NOTE — Progress Notes (Signed)
John Barton Date of Birth:  16-Oct-1945 [facility] [id number] [address]   [place], [place]  [address] [phone]           Fax   [phone]  History of Present Illness: This pleasant 65 year old gentleman is seen for a scheduled followup office visit.  He has a complex past medical history.  He does have a history of known coronary artery disease, as well as valvular heart disease.  He underwent CABG to his right coronary artery and to his LAD in 1995.  At that time.  He also underwent replacement of his aortic valve with a St. Jude mechanical valve prosthesis.  In June 2010.  He was hospitalized with paroxysmal recurrent supraventricular tachycardia with wide complex and was treated with Quinaglute, which he has tolerated amazingly well.  He's had no recurrent tachycardias.  He's not having a dizziness or syncope.  Denies any chest pains.  Current Outpatient Prescriptions  Medication Sig Dispense Refill  . allopurinol (ZYLOPRIM) 100 MG tablet Take 100 mg by mouth 3 (three) times daily.        Marland Kitchen atorvastatin (LIPITOR) 80 MG tablet Take 80 mg by mouth daily.        . calcitRIOL (ROCALTROL) 0.5 MCG capsule Take 0.5 mcg by mouth daily.        . calcium carbonate (OS-CAL) 600 MG TABS Take 500 mg by mouth daily.       . furosemide (LASIX) 40 MG tablet Take 40 mg by mouth daily.        . Multiple Vitamins-Minerals (OCUVITE PO) Take by mouth daily.        . mycophenolate (CELLCEPT) 500 MG tablet Take 500 mg by mouth. Taking two twice a day       . Naproxen Sodium (ALEVE PO) Take by mouth as needed.        . quiniDINE gluconate 324 MG CR tablet Take 324 mg by mouth 2 (two) times daily.        . Warfarin Sodium (COUMADIN PO) Take by mouth. Take as direted         No Known Allergies  There is no problem list on file for this patient.   History  Smoking status  . Current Some Day Smoker  . Types: Cigars  Smokeless tobacco  . Not on file    History    Alcohol Use: Not on file    No family history on file.  Review of Systems: Constitutional: no fever chills diaphoresis or fatigue or change in weight.  Head and neck: no hearing loss, no epistaxis, no photophobia or visual disturbance. Respiratory: No cough, shortness of breath or wheezing. Cardiovascular: No chest pain peripheral edema, palpitations. Gastrointestinal: No abdominal distention, no abdominal pain, no change in bowel habits hematochezia or melena. Genitourinary: No dysuria, no frequency, no urgency, no nocturia. Musculoskeletal:No arthralgias, no back pain, no gait disturbance or myalgias. Neurological: No dizziness, no headaches, no numbness, no seizures, no syncope, no weakness, no tremors. Hematologic: No lymphadenopathy, no easy bruising. Psychiatric: No confusion, no hallucinations, no sleep disturbance.    Physical Exam: Filed Vitals:   04/20/11 1530  BP: 120/70  Pulse: 64   Gen. appearance reveals a well-developed, large gentleman, in no acute distress.Pupils equal and reactive.   Extraocular Movements are full.  There is no scleral icterus.  The mouth and pharynx are normal.  The neck is supple.  The carotids reveal no bruits.  The jugular venous  pressure is normal.  The thyroid is not enlarged.  There is no lymphadenopathy.  The chest is clear to percussion and auscultation. There are no rales or rhonchi. Expansion of the chest is symmetrical.  The precordium is quiet.  The first heart sound is normal.  The second heart sound is physiologically split.  There is no murmur gallop rub or click.  There is no abnormal lift or heave.  The opening and closing aortic valve clicks are sharp.The abdomen is soft and nontender. Bowel sounds are normal. The liver and spleen are not enlarged. There Are no abdominal masses. There are no bruits.  The pedal pulses are good.  There is no phlebitis or edema.  There is no cyanosis or clubbing. Strength is normal and symmetrical  in all extremities.  There is no lateralizing weakness.  There are no sensory deficits.  The skin is warm and dry.  There is no rash.     Assessment / Plan: Continue present medication.  Recheck in 6 months for followup office visit and EKG

## 2011-04-20 NOTE — Patient Instructions (Signed)
Your physician wants you to follow-up in: 6 months  You will receive a reminder letter in the mail two months in advance. If you don't receive a letter, please call our office to schedule the follow-up appointment.  Your physician recommends that you continue on your current medications as directed. Please refer to the Current Medication list given to you today.  

## 2011-04-20 NOTE — Assessment & Plan Note (Signed)
The patient remains on clonidine twice a day.  He has not been experiencing any tachycardia or palpitations

## 2011-04-20 NOTE — Assessment & Plan Note (Signed)
The patient has not been having any recurrent angina pectoris.  He is relatively sedentary.  He still smokes an occasional cigar, but does not smoke cigarettes.

## 2011-07-12 ENCOUNTER — Encounter: Payer: Self-pay | Admitting: Cardiology

## 2011-07-26 ENCOUNTER — Other Ambulatory Visit: Payer: Self-pay | Admitting: *Deleted

## 2011-07-26 MED ORDER — QUINIDINE GLUCONATE ER 324 MG PO TBCR: 324.0000 mg | EXTENDED_RELEASE_TABLET | Freq: Two times a day (BID) | ORAL | Status: DC

## 2011-10-05 LAB — HM COLONOSCOPY: HM COLON: NORMAL

## 2011-10-25 ENCOUNTER — Encounter: Payer: Self-pay | Admitting: *Deleted

## 2011-10-25 DIAGNOSIS — M109 Gout, unspecified: Secondary | ICD-10-CM | POA: Insufficient documentation

## 2011-10-25 DIAGNOSIS — J449 Chronic obstructive pulmonary disease, unspecified: Secondary | ICD-10-CM | POA: Insufficient documentation

## 2011-10-25 DIAGNOSIS — M1990 Unspecified osteoarthritis, unspecified site: Secondary | ICD-10-CM | POA: Insufficient documentation

## 2011-10-29 ENCOUNTER — Encounter: Payer: Self-pay | Admitting: Cardiology

## 2011-10-29 ENCOUNTER — Ambulatory Visit (INDEPENDENT_AMBULATORY_CARE_PROVIDER_SITE_OTHER): Payer: Medicare Other | Admitting: Cardiology

## 2011-10-29 VITALS — BP 132/60 | HR 60 | Ht 71.0 in | Wt 195.0 lb

## 2011-10-29 DIAGNOSIS — Z951 Presence of aortocoronary bypass graft: Secondary | ICD-10-CM

## 2011-10-29 DIAGNOSIS — Z952 Presence of prosthetic heart valve: Secondary | ICD-10-CM

## 2011-10-29 DIAGNOSIS — E785 Hyperlipidemia, unspecified: Secondary | ICD-10-CM

## 2011-10-29 DIAGNOSIS — Z954 Presence of other heart-valve replacement: Secondary | ICD-10-CM

## 2011-10-29 DIAGNOSIS — I359 Nonrheumatic aortic valve disorder, unspecified: Secondary | ICD-10-CM

## 2011-10-29 NOTE — Assessment & Plan Note (Signed)
Patient has a history of dyslipidemia.  This is followed by his nephrologist.  He is status post successful kidney transplant

## 2011-10-29 NOTE — Progress Notes (Signed)
John Barton Date of Birth:  [birth year] [facility] [address] [place], [place]  [address] [phone]  Fax   [phone]  HPI: This pleasant 66 year old gentleman is seen for a six-month followup office visit.  As a complex past medical history.  In 1995 he underwent CABG to his right coronary artery and to his LAD and also underwent replacement of his aortic valve with a St. Jude mechanical valve.  In June 2010 he was hospitalized with paroxysmal recurrent supraventricular tachycardia with wide complex.  He responded to [place] which he has remained on.  He has had no recurrent episodes of tachycardia.  He is not having any chest pain or shortness of breath.  Current Outpatient Prescriptions  Medication Sig Dispense Refill  . allopurinol (ZYLOPRIM) 100 MG tablet Take 100 mg by mouth 3 (three) times daily.        Marland Kitchen atorvastatin (LIPITOR) 80 MG tablet Take 80 mg by mouth daily.        . calcitRIOL (ROCALTROL) 0.5 MCG capsule Take 0.25 mcg by mouth daily.       . calcium carbonate (OS-CAL) 600 MG TABS Take 500 mg by mouth daily.       . furosemide (LASIX) 40 MG tablet Take 40 mg by mouth daily.        . Multiple Vitamins-Minerals (OCUVITE PO) Take by mouth daily.        . mycophenolate (CELLCEPT) 500 MG tablet Take 500 mg by mouth. Taking two twice a day       . Naproxen Sodium (ALEVE PO) Take by mouth as needed.        . quiniDINE gluconate 324 MG CR tablet Take 1 tablet (324 mg total) by mouth 2 (two) times daily.  60 tablet  6  . Warfarin Sodium (COUMADIN PO) Take by mouth. Take as direted         No Known Allergies  Patient Active Problem List  Diagnoses  . S/P aortic valve replacement  . Hx of CABG  . SVT (supraventricular tachycardia)  . Kidney replaced by transplant  . Bifascicular block  . Dyslipidemia  . H/O paroxysmal supraventricular tachycardia  . Chronic kidney disease  . Gout  . COPD (chronic obstructive pulmonary disease)  .  Arthritis    History  Smoking status  . Current Some Day Smoker  . Types: Cigars  Smokeless tobacco  . Not on file    History  Alcohol Use: Not on file    Family History  Problem Relation Age of Onset  . Heart failure Father     Review of Systems: The patient denies any heat or cold intolerance.  No weight gain or weight loss.  The patient denies headaches or blurry vision.  There is no cough or sputum production.  The patient denies dizziness.  There is no hematuria or hematochezia.  The patient denies any muscle aches or arthritis.  The patient denies any rash.  The patient denies frequent falling or instability.  There is no history of depression or anxiety.  All other systems were reviewed and are negative.   Physical Exam: Filed Vitals:   10/29/11 1625  BP: 132/60  Pulse: 60   the general appearance reveals a well-developed well-nourished gentleman in no distress.The head and neck exam reveals pupils equal and reactive.  Extraocular movements are full.  There is no scleral icterus.  The mouth and pharynx are normal.  The neck is supple.  The carotids reveal no  bruits.  The jugular venous pressure is normal.  The  thyroid is not enlarged.  There is no lymphadenopathy.  The chest is clear to percussion and auscultation.  There are no rales or rhonchi.  Expansion of the chest is symmetrical.  The precordium is quiet.  There are sharp opening and closing metallic clicks from his prosthetic aortic valve.  There is no murmur of aortic insufficiency heard There is no abnormal lift or heave.  The abdomen is soft and nontender.  The bowel sounds are normal.  The liver and spleen are not enlarged.  There are no abdominal masses.  There are no abdominal bruits.  Extremities reveal good pedal pulses.  There is no phlebitis or edema.  There is no cyanosis or clubbing.  Strength is normal and symmetrical in all extremities.  There is no lateralizing weakness.  There are no sensory deficits.  The  skin is warm and dry.  There is no rash.  EKG shows normal sinus rhythm with borderline first degree block.  He has a chronic bifascicular block.  He has occasional PVCs.    Assessment / Plan: The patient is doing well and should stay on his current therapy and we will plan to recheck him again in 6 months

## 2011-10-29 NOTE — Assessment & Plan Note (Signed)
The patient has been working out at a gym.  He is not having any exertional chest pain or angina.

## 2011-10-29 NOTE — Assessment & Plan Note (Signed)
Has had no symptoms referable to his aortic valve replacement.  He remains on long-term Coumadin.  He said no thromboembolic episodes.

## 2011-10-29 NOTE — Patient Instructions (Signed)
Your physician recommends that you continue on your current medications as directed. Please refer to the Current Medication list given to you today.  Your physician wants you to follow-up in: 6 months. You will receive a reminder letter in the mail two months in advance. If you don't receive a letter, please call our office to schedule the follow-up appointment.  

## 2011-11-20 NOTE — Progress Notes (Signed)
Addended by: Janne Napoleon on: 11/20/2011 08:23 PM   Modules accepted: Orders

## 2012-07-18 ENCOUNTER — Other Ambulatory Visit: Payer: Self-pay | Admitting: *Deleted

## 2012-07-18 MED ORDER — QUINIDINE GLUCONATE ER 324 MG PO TBCR: 324.0000 mg | EXTENDED_RELEASE_TABLET | Freq: Two times a day (BID) | ORAL | Status: DC

## 2012-07-21 ENCOUNTER — Other Ambulatory Visit: Payer: Self-pay

## 2012-07-21 MED ORDER — QUINIDINE GLUCONATE ER 324 MG PO TBCR: 324.0000 mg | EXTENDED_RELEASE_TABLET | Freq: Two times a day (BID) | ORAL | Status: DC

## 2012-07-22 ENCOUNTER — Other Ambulatory Visit: Payer: Self-pay | Admitting: *Deleted

## 2012-07-22 MED ORDER — QUINIDINE GLUCONATE ER 324 MG PO TBCR: 324.0000 mg | EXTENDED_RELEASE_TABLET | Freq: Two times a day (BID) | ORAL | Status: DC

## 2012-07-22 NOTE — Telephone Encounter (Signed)
Fax Received. Refill Completed. John Barton (R.M.A)  PHARMACY CALLED WANTING REFILLS FOR PT.

## 2012-08-06 ENCOUNTER — Ambulatory Visit (INDEPENDENT_AMBULATORY_CARE_PROVIDER_SITE_OTHER): Payer: Medicare Other | Admitting: Vascular Surgery

## 2012-08-06 DIAGNOSIS — R55 Syncope and collapse: Secondary | ICD-10-CM

## 2012-08-06 NOTE — Progress Notes (Signed)
Carotid duplex performed @ [facility] 08/06/2012

## 2012-08-11 ENCOUNTER — Ambulatory Visit (INDEPENDENT_AMBULATORY_CARE_PROVIDER_SITE_OTHER): Payer: Medicare Other | Admitting: Nurse Practitioner

## 2012-08-11 ENCOUNTER — Encounter: Payer: Self-pay | Admitting: Nurse Practitioner

## 2012-08-11 ENCOUNTER — Ambulatory Visit (INDEPENDENT_AMBULATORY_CARE_PROVIDER_SITE_OTHER): Payer: Medicare Other

## 2012-08-11 VITALS — BP 126/62 | HR 48 | Resp 16 | Ht 70.0 in | Wt 189.8 lb

## 2012-08-11 DIAGNOSIS — R55 Syncope and collapse: Secondary | ICD-10-CM

## 2012-08-11 NOTE — Progress Notes (Addendum)
John Barton Date of Birth: 1945/06/22 Medical Record [id number]  History of Present Illness: John Barton is seen back today for a work in visit. He is seen for John Barton. He has a complex past medical history. This includes remote remote CABG to the RCA and to the LAD as well AVR with a St. Jude. He has also had SVT back in 2010. He has been on chronic Quinaglute. Other issues include ESRD, gout, HLD and chronic anticoagulation. He was last seen here in May of 2013.   He is referred here from John Barton due to an episode of syncope. He has had carotid dopplers at [facility] showing 60 to 79% on the right (may have been underestimated) and less than 40% on the left.   He comes in today. He is here alone. Has basically been doing ok since his last visit here. No chest pain. Not short of breath. Continues to work. He describes an episode of syncope back in the middle of January. He had gotten up, washed his face, was in the kitchen fixing his coffee and then got dizzy and fell to the floor. He got back up, only to go back down. This was not witnessed. No incontinence reported or tongue biting. Saw John Barton yesterday and was referred back here. Had dopplers and labs drawn per John Barton. He will have some occasional dizzy spells but no recurrence. Has had some left arm pain since the episode.   Current Outpatient Prescriptions on File Prior to Visit  Medication Sig Dispense Refill  . allopurinol (ZYLOPRIM) 100 MG tablet Take 100 mg by mouth 3 (three) times daily.        Marland Kitchen atorvastatin (LIPITOR) 80 MG tablet Take 40 mg by mouth daily.       . calcitRIOL (ROCALTROL) 0.5 MCG capsule Take 0.25 mcg by mouth daily.       . calcium carbonate (OS-CAL) 600 MG TABS Take 500 mg by mouth daily.       . furosemide (LASIX) 40 MG tablet Take 20 mg by mouth daily.       . Multiple Vitamins-Minerals (OCUVITE PO) Take by mouth daily.        . mycophenolate (CELLCEPT) 500 MG tablet Take 500 mg by mouth. Taking two twice a  day       . Naproxen Sodium (ALEVE PO) Take by mouth as needed.        . quiniDINE gluconate 324 MG CR tablet Take 1 tablet (324 mg total) by mouth 2 (two) times daily.  60 tablet  6  . Warfarin Sodium (COUMADIN PO) Take by mouth. Take as direted        No current facility-administered medications on file prior to visit.    No Known Allergies  Past Medical History  Diagnosis Date  . Coronary artery disease   . H/O paroxysmal supraventricular tachycardia     with wide complex  . Chronic kidney disease 09/10/1996    transplant done  . Hyperlipidemia   . Arrhythmia   . Gout     H/O  . COPD (chronic obstructive pulmonary disease)   . Hypertension   . Arthritis     Past Surgical History  Procedure Laterality Date  . Coronary artery bypass graft  1995    RCA andLAD  . Aortic valve replacement  1995    ST.Jude  . Cardiac catheterization  1995  . Parathyroidectomy      History  Smoking status  . Current Some Day  Smoker  . Types: Cigars  Smokeless tobacco  . Not on file    History  Alcohol Use No    Family History  Problem Relation Age of Onset  . Heart failure Father     Review of Systems: The review of systems is per the HPI.  All other systems were reviewed and are negative.  Physical Exam: BP 126/62  Pulse 48  Resp 16  Ht 5\' 10"  (1.778 m)  Wt 189 lb 12 oz (86.07 kg)  BMI 27.23 kg/m2 Patient is very pleasant and in no acute distress. Skin is warm and dry. Color is normal.  HEENT is unremarkable. Normocephalic/atraumatic. PERRL. Sclera are nonicteric. Neck is supple. No masses. No JVD. Lungs are clear. Cardiac exam shows a regular rate and rhythm. Valve is crisp. Abdomen is soft. Extremities are without edema. Gait and ROM are intact. No gross neurologic deficits noted.  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 Tracing is reviewed with John Barton today.    Lab Results  Component Value Date   WBC 8.3  12/04/2008   HGB 14.6 12/04/2008   HCT 42.9 12/04/2008   MCV 88.2 12/04/2008   PLT 176 12/04/2008     Chemistry      Component Value Date/Time   NA 138 12/11/2008 0600   K 4.9 12/11/2008 0600   CL 103 12/11/2008 0600   CO2 26 12/11/2008 0600   BUN 31* 12/11/2008 0600   CREATININE 1.77* 12/11/2008 0600      Component Value Date/Time   CALCIUM 9.3 12/11/2008 0600   ALKPHOS 83 12/11/2008 0600   AST 26 12/11/2008 0600   ALT 24 12/11/2008 0600   BILITOT 0.7 12/11/2008 0600     Lab Results  Component Value Date   CHOL  Value: 150        ATP III CLASSIFICATION:  <200     mg/dL   Desirable  200-239  mg/dL   Borderline High  >=240    mg/dL   High        12/05/2008   HDL 28* 12/05/2008   LDLCALC  Value: 87        Total Cholesterol/HDL:CHD Risk Coronary Heart Disease Risk Table                     Men   Women  1/2 Average Risk   3.4   3.3  Average Risk       5.0   4.4  2 X Average Risk   9.6   7.1  3 X Average Risk  23.4   11.0        Use the calculated Patient Ratio above and the CHD Risk Table to determine the patient's CHD Risk.        ATP III CLASSIFICATION (LDL):  <100     mg/dL   Optimal  100-129  mg/dL   Near or Above                    Optimal  130-159  mg/dL   Borderline  160-189  mg/dL   High  >190     mg/dL   Very High 12/05/2008   TRIG 175* 12/05/2008   CHOLHDL 5.4 12/05/2008     Assessment / Plan: 1. Syncope - most likely from his rhythm given his known conduction disorder. Have discussed with John Barton. Will update his echo, place event monitor and get in to  see John Barton. Probable pacemaker.   2. SVT - will place an event monitor. He remains on his Quinidine. We have left him on this for now.   3. CAD/AVR - remote surgery from 1995  4. Carotid disease - will need follow up.   Patient is agreeable to this plan and will call if any problems develop in the interim.

## 2012-08-11 NOTE — Progress Notes (Signed)
Placed a event monitor and went over instructions on how to use it and when to return it

## 2012-08-11 NOTE — Patient Instructions (Addendum)
We need to put an event monitor on for the next 30 days  We need to set you up to see one of the EP doctors  We are updating your echo   For now, stay on your current medicines  You should not be driving at this time  Call the [place] office at [phone] if you have any questions, problems or concerns.

## 2012-08-12 ENCOUNTER — Encounter: Payer: Self-pay | Admitting: Cardiology

## 2012-08-12 ENCOUNTER — Telehealth: Payer: Self-pay | Admitting: Cardiology

## 2012-08-12 NOTE — Telephone Encounter (Signed)
Yes, he can do "as he feels" and his normal activities.

## 2012-08-12 NOTE — Telephone Encounter (Signed)
Pt was notified.  

## 2012-08-12 NOTE — Telephone Encounter (Signed)
Pt wants to know if he can exercise while he has monitor on

## 2012-08-14 ENCOUNTER — Ambulatory Visit: Payer: Medicare Other | Admitting: Cardiology

## 2012-08-15 ENCOUNTER — Ambulatory Visit (HOSPITAL_COMMUNITY): Payer: Medicare Other | Attending: Internal Medicine | Admitting: Radiology

## 2012-08-15 ENCOUNTER — Telehealth: Payer: Self-pay | Admitting: Nurse Practitioner

## 2012-08-15 DIAGNOSIS — R55 Syncope and collapse: Secondary | ICD-10-CM

## 2012-08-15 NOTE — Telephone Encounter (Signed)
Spoke with pt. He is requesting a note to return to work. I reviewed last office note from Truitt Merle, NP which indicates pt should not be driving. Pt reports he lives alone and has no one to drive him.  He states he has been driving.  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  I told pt I would need to discuss with Truitt Merle, NP.

## 2012-08-15 NOTE — Telephone Encounter (Signed)
I would get Dr. Sherryl Barters input

## 2012-08-15 NOTE — Telephone Encounter (Signed)
Reviewed with Dr.Brackbill and pt should not drive until after evaluation for syncopal episode is complete. We cannot provide return to work note at this time.  I spoke with pt and gave him this information. He is currently wearing event monitor and is aware of appt with Dr.Klein on August 29, 2012

## 2012-08-15 NOTE — Telephone Encounter (Signed)
I agree that the patient should not drive until his workup is complete and the cause of his sudden syncope is known.

## 2012-08-15 NOTE — Progress Notes (Signed)
Echocardiogram performed.  

## 2012-08-15 NOTE — Telephone Encounter (Signed)
New problem    Pt need a letter to be release by to work ASAP please fax to his work at [phone]. Pt would like to speak so someone if they can't fax note today.

## 2012-08-20 ENCOUNTER — Telehealth: Payer: Self-pay | Admitting: Cardiology

## 2012-08-20 NOTE — Telephone Encounter (Signed)
New Problem:     Patient returned your call regarding his recent ECHO.  Please call back.

## 2012-08-20 NOTE — Telephone Encounter (Signed)
Spoke with pt, aware of echo results. 

## 2012-08-29 ENCOUNTER — Encounter (HOSPITAL_COMMUNITY): Payer: Self-pay | Admitting: Pharmacy Technician

## 2012-08-29 ENCOUNTER — Encounter: Payer: Self-pay | Admitting: Cardiology

## 2012-08-29 ENCOUNTER — Ambulatory Visit (INDEPENDENT_AMBULATORY_CARE_PROVIDER_SITE_OTHER): Payer: Medicare Other | Admitting: Internal Medicine

## 2012-08-29 ENCOUNTER — Encounter: Payer: Self-pay | Admitting: *Deleted

## 2012-08-29 ENCOUNTER — Encounter: Payer: Self-pay | Admitting: Internal Medicine

## 2012-08-29 VITALS — BP 153/74 | HR 56 | Ht 70.5 in | Wt 194.8 lb

## 2012-08-29 DIAGNOSIS — I452 Bifascicular block: Secondary | ICD-10-CM

## 2012-08-29 DIAGNOSIS — R55 Syncope and collapse: Secondary | ICD-10-CM

## 2012-08-29 DIAGNOSIS — I2510 Atherosclerotic heart disease of native coronary artery without angina pectoris: Secondary | ICD-10-CM

## 2012-08-29 LAB — CBC WITH DIFFERENTIAL/PLATELET
Basophils Relative: 0.5 % (ref 0.0–3.0)
Eosinophils Relative: 1.6 % (ref 0.0–5.0)
HCT: 40.7 % (ref 39.0–52.0)
Lymphs Abs: 1 10*3/uL (ref 0.7–4.0)
MCV: 88.8 fl (ref 78.0–100.0)
Monocytes Absolute: 0.5 10*3/uL (ref 0.1–1.0)
Neutro Abs: 5.2 10*3/uL (ref 1.4–7.7)
RBC: 4.58 Mil/uL (ref 4.22–5.81)
WBC: 6.8 10*3/uL (ref 4.5–10.5)

## 2012-08-29 LAB — BASIC METABOLIC PANEL
BUN: 28 mg/dL — ABNORMAL HIGH (ref 6–23)
Chloride: 103 mEq/L (ref 96–112)
Potassium: 4.8 mEq/L (ref 3.5–5.1)

## 2012-08-29 NOTE — Assessment & Plan Note (Signed)
As above.

## 2012-08-29 NOTE — Patient Instructions (Addendum)
Your physician has requested that you have en exercise stress myoview. For further information please visit [email or web address]. Please follow instruction sheet, as given.   Your physician has recommended that you have an EP Study. This test is used to assess serious arrhythmias (irregular heartbeats). During an Electro-physiology Study (EPS), a thin, flexible wire is passed through a vein in your groin (upper thigh) or neck up to the heart. The wire records the heart's electrical signals. Your doctor uses the wire to electrically stimulate your heart and trigger an arrhythmic. This allows the doctor to see whether an antiarrhythmia medicine can help manage the problem or if further procedures are necessary (i.e., ablation/ICD). Radiofrequency ablation, a procedure used to fix some types of arrthythmia, may be done during an EPS. This is done in the hospital and often requires an overnight stay. Please see the instruction sheet given to your today for more information.

## 2012-08-29 NOTE — Progress Notes (Signed)
ELECTROPHYSIOLOGY CONSULT NOTE  Patient ID: John Barton, MRN: [id number], DOB/AGE: Oct 14, 1945 67 y.o. Admit date: (Not on file) Date of Consult: 08/29/2012  Primary Physician: No primary provider on file. Primary Cardiologist:  TB   Chief Complaint:  syncope   HPI John Barton is a 67 y.o. male   with a history of coronary artery disease with remote CABG as well as a St. Jude aVR undertaken for what sounds like aortic regurgitation presenting as congestive heart failure Echocardiogram 2/14 demonstrated near normal left ventricular function at 50-55% with a modest amount of left ventricular dilatation. Aortic valve prosthesis seemed to have reasonable gradients His history of end-stage renal disease hyperlipidemia. He is anticoagulated.  He has a history of syncope. This occurred one morning after he got up and walk about the house. He was standing in his kitchen getting a cup of coffee. The next thing he knew he was on the floor. He was able to get up off the floor without any residual symptoms. He was again standing by his coffee and began he found himself on the floor. He had no awareness of the fall on either occasion and on neither occasion he had residual orthostatic intolerance. He has no prior history of syncope. He has no history of presyncope.  His exercise tolerance is moderate. He has been working out for the last month.  He has some degree of carotid stenosis 60-79% on the right.  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 Coronary artery disease   . H/O paroxysmal supraventricular tachycardia     with wide complex  . Chronic kidney disease 09/10/1996    transplant done  . Hyperlipidemia   . Arrhythmia   . Gout     H/O  . COPD (chronic  obstructive pulmonary disease)   . Hypertension   . Arthritis       Surgical History:  Past Surgical History  Procedure Laterality Date  . Coronary artery bypass graft  1995    RCA andLAD  . Aortic valve replacement  1995    ST.Jude  . Cardiac catheterization  1995  . Parathyroidectomy       Home Meds: Prior to Admission medications   Medication Sig Start Date End Date Taking? Authorizing Provider  allopurinol (ZYLOPRIM) 100 MG tablet Take 100 mg by mouth 3 (three) times daily.     Yes Historical Provider, MD  atorvastatin (LIPITOR) 80 MG tablet Take 40 mg by mouth daily.    Yes Historical Provider, MD  calcitRIOL (ROCALTROL) 0.5 MCG capsule Take 0.25 mcg by mouth daily.    Yes Historical Provider, MD  calcium carbonate (OS-CAL) 600 MG TABS Take 500 mg by mouth daily.    Yes Historical Provider, MD  furosemide (LASIX) 40 MG tablet Take 20 mg by mouth daily.    Yes Historical Provider, MD  Multiple Vitamins-Minerals (OCUVITE PO) Take by mouth daily.     Yes Historical Provider, MD  mycophenolate (CELLCEPT) 500 MG tablet Take 500 mg by mouth. Taking two twice a day    Yes Historical Provider, MD  Naproxen Sodium (ALEVE PO) Take by mouth as needed.     Yes Historical Provider, MD  quiniDINE gluconate 324 MG CR tablet Take 1 tablet (324 mg total) by mouth 2 (two) times daily.  07/22/12  Yes Darlin Coco, MD  varenicline (CHANTIX) 1 MG tablet Take 1 mg by mouth daily.   Yes Historical Provider, MD  Warfarin Sodium (COUMADIN PO) Take by mouth. Take as direted    Yes Historical Provider, MD      Allergies: No Known Allergies  History   Social History  . Marital Status: Divorced    Spouse Name: N/A    Number of Children: N/A  . Years of Education: N/A   Occupational History  . Not on file.   Social History Main Topics  . Smoking status: Current Some Day Smoker    Types: Cigars  . Smokeless tobacco: Not on file  . Alcohol Use: No  . Drug Use: No  . Sexually Active: Not  Currently   Other Topics Concern  . Not on file   Social History Narrative  . No narrative on file     Family History  Problem Relation Age of Onset  . Heart failure Father      ROS:  Please see the history of present illness.    All other systems reviewed and negative.    Physical Exam:   Blood pressure 153/74, pulse 56, height 5' 10.5" (1.791 m), weight 194 lb 12.8 oz (88.361 kg). General: Well developed, well nourished male in no acute distress. Head: Normocephalic, atraumatic, sclera non-icteric, no xanthomas, nares are without discharge. EENT- normal Lymph Nodes:  none Back: without scoliosis/kyphosis , no CVA tendersness Neck: Bilateral carotid bruits. JVD not elevated. Lungs: Clear bilaterally to auscultation without wheezes, rales, or rhonchi. Breathing is unlabored. Heart: RRR with S1 mechanical S2. 3/6 murmur , rubs, or gallops appreciated. Abdomen: Soft, non-tender, non-distended with normoactive bowel sounds. No hepatomegaly. No rebound/guarding. No obvious abdominal masses. Msk:  Strength and tone appear normal for age. Extremities: No clubbing or cyanosis. No edema.  Distal pedal pulses are 2+ and equal bilaterally. Skin: Warm and Dry Neuro: Alert and oriented X 3. CN III-XII intact Grossly normal sensory and motor function . Psych:  Responds to questions appropriately with a normal affect.      Labs: Cardiac Enzymes No results found for this basename: CKTOTAL, CKMB, TROPONINI,  in the last 72 hours CBC Lab Results  Component Value Date   WBC 8.3 12/04/2008   HGB 14.6 12/04/2008   HCT 42.9 12/04/2008   MCV 88.2 12/04/2008   PLT 176 12/04/2008   PROTIME: No results found for this basename: LABPROT, INR,  in the last 72 hours Chemistry No results found for this basename: NA, K, CL, CO2, BUN, CREATININE, CALCIUM, LABALBU, PROT, BILITOT, ALKPHOS, ALT, AST, GLUCOSE,  in the last 168 hours Lipids Lab Results  Component Value Date   CHOL  Value: 150        ATP  III CLASSIFICATION:  <200     mg/dL   Desirable  200-239  mg/dL   Borderline High  >=240    mg/dL   High        12/05/2008   HDL 28* 12/05/2008   LDLCALC  Value: 87        Total Cholesterol/HDL:CHD Risk Coronary Heart Disease Risk Table                     Men   Women  1/2 Average Risk   3.4   3.3  Average Risk       5.0   4.4  2 X Average Risk   9.6   7.1  3  X Average Risk  23.4   11.0        Use the calculated Patient Ratio above and the CHD Risk Table to determine the patient's CHD Risk.        ATP III CLASSIFICATION (LDL):  <100     mg/dL   Optimal  100-129  mg/dL   Near or Above                    Optimal  130-159  mg/dL   Borderline  160-189  mg/dL   High  >190     mg/dL   Very High 12/05/2008   TRIG 175* 12/05/2008    Radiology/Studies:  No results found.  EKG:  Narrow qrs    Assessment and Plan:    John Barton

## 2012-08-29 NOTE — Assessment & Plan Note (Signed)
The patient has an episode of syncope. There were 2 temporally related episodes.  Interestingly, there appeared to be no residual orthostatic intolerance between the 2 suggesting that they were not related to his vasomotor relaxation which I would not have anticipated would have resolved sufficiently to allow him to stand without intercurrent symptoms. This makes of arrhythmia in my mind more likely.  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    I suggested that we undertake a Myoview scan to reassess coronary perfusion and then undertaken EP study looking for substrate for both bradycardia arrhythmia as well as tach arrhythmia in the event of finding neither we would implant a loop recorder. In the event that bradycardia or tachycardia were identified the appropriate device would be implanted.  He is reminded that driving is not recommended at this time

## 2012-09-04 ENCOUNTER — Ambulatory Visit (HOSPITAL_COMMUNITY): Payer: Medicare Other | Attending: Internal Medicine | Admitting: Radiology

## 2012-09-04 VITALS — BP 188/79 | HR 56 | Ht 70.5 in | Wt 186.0 lb

## 2012-09-04 DIAGNOSIS — R42 Dizziness and giddiness: Secondary | ICD-10-CM | POA: Insufficient documentation

## 2012-09-04 DIAGNOSIS — I4510 Unspecified right bundle-branch block: Secondary | ICD-10-CM | POA: Insufficient documentation

## 2012-09-04 DIAGNOSIS — F17200 Nicotine dependence, unspecified, uncomplicated: Secondary | ICD-10-CM | POA: Insufficient documentation

## 2012-09-04 DIAGNOSIS — I2510 Atherosclerotic heart disease of native coronary artery without angina pectoris: Secondary | ICD-10-CM

## 2012-09-04 DIAGNOSIS — Z8249 Family history of ischemic heart disease and other diseases of the circulatory system: Secondary | ICD-10-CM | POA: Insufficient documentation

## 2012-09-04 DIAGNOSIS — I498 Other specified cardiac arrhythmias: Secondary | ICD-10-CM | POA: Insufficient documentation

## 2012-09-04 DIAGNOSIS — R209 Unspecified disturbances of skin sensation: Secondary | ICD-10-CM | POA: Insufficient documentation

## 2012-09-04 DIAGNOSIS — I6529 Occlusion and stenosis of unspecified carotid artery: Secondary | ICD-10-CM | POA: Insufficient documentation

## 2012-09-04 DIAGNOSIS — R55 Syncope and collapse: Secondary | ICD-10-CM

## 2012-09-04 DIAGNOSIS — E785 Hyperlipidemia, unspecified: Secondary | ICD-10-CM | POA: Insufficient documentation

## 2012-09-04 MED ORDER — REGADENOSON 0.4 MG/5ML IV SOLN
0.4000 mg | Freq: Once | INTRAVENOUS | Status: AC
  Administered 2012-09-04: 0.4 mg via INTRAVENOUS

## 2012-09-04 MED ORDER — TECHNETIUM TC 99M SESTAMIBI GENERIC - CARDIOLITE
11.0000 | Freq: Once | INTRAVENOUS | Status: AC | PRN
  Administered 2012-09-04: 11 via INTRAVENOUS

## 2012-09-04 MED ORDER — TECHNETIUM TC 99M SESTAMIBI GENERIC - CARDIOLITE
33.0000 | Freq: Once | INTRAVENOUS | Status: AC | PRN
  Administered 2012-09-04: 33 via INTRAVENOUS

## 2012-09-04 NOTE — Progress Notes (Signed)
[facility] [address] [place], [place] [address] [phone]    Cardiology Nuclear Med Study  John Barton is a 67 y.o. male     MRN : [id number]     DOB: 1945/12/06  Procedure Date: 09/04/2012  Nuclear Med Background Indication for Stress Test:  Evaluation for Ischemia, Graft Patency and Evaluation for Tachy-Brady Syndrome History:  '95 CABG with AVR; '10 [id number] wall infarct, no ischemia, EF=47%; 2/14 Echo:EF=55% Cardiac Risk Factors: Carotid Disease, Family History - CAD, Lipids, RBBB and Smoker  Symptoms:  Dizziness and Syncope   Nuclear Pre-Procedure Caffeine/Decaff Intake:  None NPO After: 10:00pm   Lungs:  Clear. O2 Sat: 96% on room air. IV 0.9% NS with Angio Cath:  22g  IV Site: R Hand  IV Started by:  Crissie Figures, RN  Chest Size (in):  44 Cup Size: n/a  Height: 5' 10.5" (1.791 m)  Weight:  186 lb (84.369 kg)  BMI:  Body mass index is 26.3 kg/(m^2). Tech Comments:  N/A    Nuclear Med Study 1 or 2 day study: 1 day  Stress Test Type:  Treadmill/Lexiscan  Reading MD: Kirk Ruths, MD  Order Authorizing Provider:  Jolyn Nap, MD  Resting Radionuclide: Technetium 13m Sestamibi  Resting Radionuclide Dose: 11.0 mCi   Stress Radionuclide:  Technetium 12m Sestamibi  Stress Radionuclide Dose: 33.0 mCi           Stress Protocol Rest HR: 56 Stress HR: 111  Rest BP: 188/79 Stress BP: 180/81  Exercise Time (min): 7:15 METS: 6.2   Predicted Max HR: 152 bpm % Max HR: 73.03 bpm Rate Pressure Product: 19980   Dose of Adenosine (mg):  n/a Dose of Lexiscan: 0.4 mg  Dose of Atropine (mg): n/a Dose of Dobutamine: n/a mcg/kg/min (at max HR)  Stress Test Technologist: Letta Moynahan, CMA-N  Nuclear Technologist:  Charlton Amor, CNMT     Rest Procedure:  Myocardial perfusion imaging was performed at rest 45 minutes following the intravenous administration of Technetium 56m Sestamibi. Rest ECG: Sinus bradycardia, RBBB, LAFB, first  degree AV block.  Stress Procedure: The patient attempted to walk the treadmill utilizing the Bruce protocol, but was unable to achieve his target heart rate. He was then given IV Lexiscan 0.4 mg over 15-seconds with concurrent low level exercise and then Technetium 76m Sestamibi was injected at 30-seconds while the patient continued walking one more minute.  Quantitative spect images were obtained after a 45-minute delay.  Stress ECG: No significant ST segment change suggestive of ischemia.  QPS Raw Data Images:  Acquisition technically good; LVE. Stress Images:  There is decreased uptake in the inferior wall. Rest Images:  There is decreased uptake in the inferior wall slightly less prominent compared to the stress images. Subtraction (SDS):  These findings are consistent with prior inferior infarct and minimal peri-infarct ischemia. Transient Ischemic Dilatation (Normal <1.22):  1.07 Lung/Heart Ratio (Normal <0.45):  0.35  Quantitative Gated Spect Images QGS EDV:  171 ml QGS ESV:  81 ml  Impression Exercise Capacity:  Lexiscan with low level exercise. BP Response:  Normal blood pressure response. Clinical Symptoms:  There is dyspnea. ECG Impression:  No significant ST segment change suggestive of ischemia. Comparison with Prior Nuclear Study: No images to compare  Overall Impression:  Low risk stress nuclear study with a large, severe, partially reversible inferior defect consistent with prior inferior infarct and minimal peri-infarct ischemia.  LV Ejection Fraction: 53%.  LV Wall Motion:  Inferior hypokinesis.  Kirk Ruths

## 2012-09-10 MED ORDER — SODIUM CHLORIDE 0.9 % IR SOLN
80.0000 mg | Status: AC
  Filled 2012-09-10: qty 2

## 2012-09-10 MED ORDER — CEFAZOLIN SODIUM-DEXTROSE 2-3 GM-% IV SOLR
2.0000 g | INTRAVENOUS | Status: AC
  Filled 2012-09-10 (×2): qty 50

## 2012-09-11 ENCOUNTER — Ambulatory Visit (HOSPITAL_COMMUNITY)
Admission: RE | Admit: 2012-09-11 | Discharge: 2012-09-11 | Disposition: A | Discharge status: Home / Self Care | Payer: Medicare Other | Source: Ambulatory Visit | Attending: Internal Medicine | Admitting: Internal Medicine

## 2012-09-11 ENCOUNTER — Encounter (HOSPITAL_COMMUNITY)
Admission: RE | Disposition: A | Discharge status: Home / Self Care | Payer: Self-pay | Source: Ambulatory Visit | Attending: Internal Medicine

## 2012-09-11 DIAGNOSIS — J4489 Other specified chronic obstructive pulmonary disease: Secondary | ICD-10-CM | POA: Insufficient documentation

## 2012-09-11 DIAGNOSIS — I6529 Occlusion and stenosis of unspecified carotid artery: Secondary | ICD-10-CM | POA: Insufficient documentation

## 2012-09-11 DIAGNOSIS — Z7901 Long term (current) use of anticoagulants: Secondary | ICD-10-CM | POA: Insufficient documentation

## 2012-09-11 DIAGNOSIS — Z940 Kidney transplant status: Secondary | ICD-10-CM | POA: Insufficient documentation

## 2012-09-11 DIAGNOSIS — R55 Syncope and collapse: Secondary | ICD-10-CM

## 2012-09-11 DIAGNOSIS — Z79899 Other long term (current) drug therapy: Secondary | ICD-10-CM | POA: Insufficient documentation

## 2012-09-11 DIAGNOSIS — I452 Bifascicular block: Secondary | ICD-10-CM | POA: Insufficient documentation

## 2012-09-11 DIAGNOSIS — I252 Old myocardial infarction: Secondary | ICD-10-CM | POA: Insufficient documentation

## 2012-09-11 DIAGNOSIS — I472 Ventricular tachycardia: Secondary | ICD-10-CM

## 2012-09-11 DIAGNOSIS — J449 Chronic obstructive pulmonary disease, unspecified: Secondary | ICD-10-CM | POA: Insufficient documentation

## 2012-09-11 DIAGNOSIS — F17200 Nicotine dependence, unspecified, uncomplicated: Secondary | ICD-10-CM | POA: Insufficient documentation

## 2012-09-11 DIAGNOSIS — I2510 Atherosclerotic heart disease of native coronary artery without angina pectoris: Secondary | ICD-10-CM | POA: Insufficient documentation

## 2012-09-11 DIAGNOSIS — E785 Hyperlipidemia, unspecified: Secondary | ICD-10-CM | POA: Insufficient documentation

## 2012-09-11 DIAGNOSIS — N186 End stage renal disease: Secondary | ICD-10-CM | POA: Insufficient documentation

## 2012-09-11 DIAGNOSIS — I120 Hypertensive chronic kidney disease with stage 5 chronic kidney disease or end stage renal disease: Secondary | ICD-10-CM | POA: Insufficient documentation

## 2012-09-11 HISTORY — PX: ELECTROPHYSIOLOGY STUDY: SHX5467

## 2012-09-11 LAB — SURGICAL PCR SCREEN
MRSA, PCR: NEGATIVE
Staphylococcus aureus: POSITIVE — AB

## 2012-09-11 LAB — BASIC METABOLIC PANEL
BUN: 24 mg/dL — ABNORMAL HIGH (ref 6–23)
CO2: 28 mEq/L (ref 19–32)
Calcium: 9.5 mg/dL (ref 8.4–10.5)
Creatinine, Ser: 1.42 mg/dL — ABNORMAL HIGH (ref 0.50–1.35)
Glucose, Bld: 92 mg/dL (ref 70–99)

## 2012-09-11 SURGERY — ELECTROPHYSIOLOGY STUDY
Anesthesia: LOCAL

## 2012-09-11 MED ORDER — SODIUM CHLORIDE 0.9 % IJ SOLN: 3.0000 mL | Freq: Two times a day (BID) | INTRAMUSCULAR | Status: DC

## 2012-09-11 MED ORDER — MUPIROCIN 2 % EX OINT
TOPICAL_OINTMENT | CUTANEOUS | Status: AC
  Administered 2012-09-11: 1 via NASAL
  Filled 2012-09-11: qty 22

## 2012-09-11 MED ORDER — SODIUM CHLORIDE 0.9 % IJ SOLN: 3.0000 mL | INTRAMUSCULAR | Status: DC | PRN

## 2012-09-11 MED ORDER — BUPIVACAINE HCL (PF) 0.25 % IJ SOLN
INTRAMUSCULAR | Status: AC
  Filled 2012-09-11: qty 60

## 2012-09-11 MED ORDER — SODIUM CHLORIDE 0.9 % IV SOLN: 250.0000 mL | INTRAVENOUS | Status: DC | PRN

## 2012-09-11 MED ORDER — FENTANYL CITRATE 0.05 MG/ML IJ SOLN
INTRAMUSCULAR | Status: AC
  Filled 2012-09-11: qty 2

## 2012-09-11 MED ORDER — SODIUM CHLORIDE 0.9 % IV SOLN: INTRAVENOUS | Status: DC

## 2012-09-11 MED ORDER — ACETAMINOPHEN 325 MG PO TABS
650.0000 mg | ORAL_TABLET | ORAL | Status: DC | PRN
  Filled 2012-09-11: qty 2

## 2012-09-11 MED ORDER — HYDROXYUREA 500 MG PO CAPS
ORAL_CAPSULE | ORAL | Status: AC
  Filled 2012-09-11: qty 1

## 2012-09-11 MED ORDER — HYDRALAZINE HCL 20 MG/ML IJ SOLN
10.0000 mg | Freq: Once | INTRAMUSCULAR | Status: AC
  Administered 2012-09-11: 10 mg via INTRAVENOUS
  Filled 2012-09-11: qty 1

## 2012-09-11 MED ORDER — SODIUM CHLORIDE 0.45 % IV SOLN
INTRAVENOUS | Status: DC
  Administered 2012-09-11: 08:00:00 via INTRAVENOUS

## 2012-09-11 MED ORDER — MIDAZOLAM HCL 5 MG/5ML IJ SOLN
INTRAMUSCULAR | Status: AC
  Filled 2012-09-11: qty 5

## 2012-09-11 MED ORDER — HEPARIN (PORCINE) IN NACL 2-0.9 UNIT/ML-% IJ SOLN
INTRAMUSCULAR | Status: AC
  Filled 2012-09-11: qty 500

## 2012-09-11 MED ORDER — LIDOCAINE HCL (PF) 1 % IJ SOLN
INTRAMUSCULAR | Status: AC
  Filled 2012-09-11: qty 60

## 2012-09-11 MED ORDER — SODIUM CHLORIDE 0.9 % IV SOLN
250.0000 mL | INTRAVENOUS | Status: AC
  Administered 2012-09-11: 08:00:00 via INTRAVENOUS

## 2012-09-11 MED ORDER — ONDANSETRON HCL 4 MG/2ML IJ SOLN: 4.0000 mg | Freq: Four times a day (QID) | INTRAMUSCULAR | Status: DC | PRN

## 2012-09-11 MED ORDER — DEXTROSE 5 % IV SOLN
INTRAVENOUS | Status: AC
  Filled 2012-09-11: qty 250

## 2012-09-11 MED ORDER — MUPIROCIN 2 % EX OINT
TOPICAL_OINTMENT | Freq: Two times a day (BID) | CUTANEOUS | Status: DC
  Filled 2012-09-11: qty 22

## 2012-09-11 NOTE — Progress Notes (Signed)
I spoke with Murray Hodgkins, PA/NP about patient's BP increasing after orthostatics.  Standing BP was 206/95.  Christopher ordered for patient to receive hydralazine IV and can be discharged once SBP is <170.  Waiting for orders to be put in. Will continue to monitor patient.

## 2012-09-11 NOTE — H&P (View-Only) (Signed)
ELECTROPHYSIOLOGY CONSULT NOTE  Patient ID: John Barton, MRN: [id number], DOB/AGE: 69-May-1947 69 y.o. Admit date: (Not on file) Date of Consult: 08/29/2012  Primary Physician: No primary provider on file. Primary Cardiologist:  TB   Chief Complaint:  syncope   HPI John Barton is a 67 y.o. male   with a history of coronary artery disease with remote CABG as well as a [facility] Jude aVR undertaken for what sounds like aortic regurgitation presenting as congestive heart failure Echocardiogram 2/14 demonstrated near normal left ventricular function at 50-55% with a modest amount of left ventricular dilatation. Aortic valve prosthesis seemed to have reasonable gradients His history of end-stage renal disease hyperlipidemia. He is anticoagulated.  He has a history of syncope. This occurred one morning after he got up and walk about the house. He was standing in his kitchen getting a cup of coffee. The next thing he knew he was on the floor. He was able to get up off the floor without any residual symptoms. He was again standing by his coffee and began he found himself on the floor. He had no awareness of the fall on either occasion and on neither occasion he had residual orthostatic intolerance. He has no prior history of syncope. He has no history of presyncope.  His exercise tolerance is moderate. He has been working out for the last month.  He has some degree of carotid stenosis 60-79% on the right.  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 Coronary artery disease   . H/O paroxysmal supraventricular tachycardia     with wide complex  . Chronic kidney disease 09/10/1996    transplant done  . Hyperlipidemia   . Arrhythmia   . Gout     H/O  . COPD (chronic  obstructive pulmonary disease)   . Hypertension   . Arthritis       Surgical History:  Past Surgical History  Procedure Laterality Date  . Coronary artery bypass graft  1995    RCA andLAD  . Aortic valve replacement  1995    [facility].Jude  . Cardiac catheterization  1995  . Parathyroidectomy       Home Meds: Prior to Admission medications   Medication Sig Start Date End Date Taking? Authorizing Provider  allopurinol (ZYLOPRIM) 100 MG tablet Take 100 mg by mouth 3 (three) times daily.     Yes Historical Provider, MD  atorvastatin (LIPITOR) 80 MG tablet Take 40 mg by mouth daily.    Yes Historical Provider, MD  calcitRIOL (ROCALTROL) 0.5 MCG capsule Take 0.25 mcg by mouth daily.    Yes Historical Provider, MD  calcium carbonate (OS-CAL) 600 MG TABS Take 500 mg by mouth daily.    Yes Historical Provider, MD  furosemide (LASIX) 40 MG tablet Take 20 mg by mouth daily.    Yes Historical Provider, MD  Multiple Vitamins-Minerals (OCUVITE PO) Take by mouth daily.     Yes Historical Provider, MD  mycophenolate (CELLCEPT) 500 MG tablet Take 500 mg by mouth. Taking two twice a day    Yes Historical Provider, MD  Naproxen Sodium (ALEVE PO) Take by mouth as needed.     Yes Historical Provider, MD  quiniDINE gluconate 324 MG CR tablet Take 1 tablet (324 mg total) by mouth 2 (two) times daily.  07/22/12  Yes Darlin Coco, MD  varenicline (CHANTIX) 1 MG tablet Take 1 mg by mouth daily.   Yes Historical Provider, MD  Warfarin Sodium (COUMADIN PO) Take by mouth. Take as direted    Yes Historical Provider, MD      Allergies: No Known Allergies  History   Social History  . Marital Status: Divorced    Spouse Name: N/A    Number of Children: N/A  . Years of Education: N/A   Occupational History  . Not on file.   Social History Main Topics  . Smoking status: Current Some Day Smoker    Types: Cigars  . Smokeless tobacco: Not on file  . Alcohol Use: No  . Drug Use: No  . Sexually Active: Not  Currently   Other Topics Concern  . Not on file   Social History Narrative  . No narrative on file     Family History  Problem Relation Age of Onset  . Heart failure Father      ROS:  Please see the history of present illness.    All other systems reviewed and negative.    Physical Exam:   Blood pressure 153/74, pulse 56, height 5' 10.5" (1.791 m), weight 194 lb 12.8 oz (88.361 kg). General: Well developed, well nourished male in no acute distress. Head: Normocephalic, atraumatic, sclera non-icteric, no xanthomas, nares are without discharge. EENT- normal Lymph Nodes:  none Back: without scoliosis/kyphosis , no CVA tendersness Neck: Bilateral carotid bruits. JVD not elevated. Lungs: Clear bilaterally to auscultation without wheezes, rales, or rhonchi. Breathing is unlabored. Heart: RRR with S1 mechanical S2. 3/6 murmur , rubs, or gallops appreciated. Abdomen: Soft, non-tender, non-distended with normoactive bowel sounds. No hepatomegaly. No rebound/guarding. No obvious abdominal masses. Msk:  Strength and tone appear normal for age. Extremities: No clubbing or cyanosis. No edema.  Distal pedal pulses are 2+ and equal bilaterally. Skin: Warm and Dry Neuro: Alert and oriented X 3. CN III-XII intact Grossly normal sensory and motor function . Psych:  Responds to questions appropriately with a normal affect.      Labs: Cardiac Enzymes No results found for this basename: CKTOTAL, CKMB, TROPONINI,  in the last 72 hours CBC Lab Results  Component Value Date   WBC 8.3 12/04/2008   HGB 14.6 12/04/2008   HCT 42.9 12/04/2008   MCV 88.2 12/04/2008   PLT 176 12/04/2008   PROTIME: No results found for this basename: LABPROT, INR,  in the last 72 hours Chemistry No results found for this basename: NA, K, CL, CO2, BUN, CREATININE, CALCIUM, LABALBU, PROT, BILITOT, ALKPHOS, ALT, AST, GLUCOSE,  in the last 168 hours Lipids Lab Results  Component Value Date   CHOL  Value: 150        ATP  III CLASSIFICATION:  <200     mg/dL   Desirable  200-239  mg/dL   Borderline High  >=240    mg/dL   High        12/05/2008   HDL 28* 12/05/2008   LDLCALC  Value: 87        Total Cholesterol/HDL:CHD Risk Coronary Heart Disease Risk Table                     Men   Women  1/2 Average Risk   3.4   3.3  Average Risk       5.0   4.4  2 X Average Risk   9.6   7.1  3  X Average Risk  23.4   11.0        Use the calculated Patient Ratio above and the CHD Risk Table to determine the patient's CHD Risk.        ATP III CLASSIFICATION (LDL):  <100     mg/dL   Optimal  100-129  mg/dL   Near or Above                    Optimal  130-159  mg/dL   Borderline  160-189  mg/dL   High  >190     mg/dL   Very High 12/05/2008   TRIG 175* 12/05/2008    Radiology/Studies:  No results found.  EKG:  Narrow qrs    Assessment and Plan:    Virl Axe

## 2012-09-11 NOTE — Interval H&P Note (Signed)
History and Physical Interval Note:  09/11/2012 10:04 AM  John Barton  has presented today for surgery, with the diagnosis of Syncope  The various methods of treatment have been discussed with the patient and family. After consideration of risks, benefits and other options for treatment, the patient has consented to  Procedure(s): ELECTROPHYSIOLOGY STUDY (N/A) as a surgical intervention .  The patient's history has been reviewed, patient examined, no change in status, stable for surgery.  I have reviewed the patient's chart and labs.  Questions were answered to the patient's satisfaction.     Virl Axe  Consent also include implantable device.  I have spoken with insurance,, the intention is to proceed with ICD if inducible for VT ans Loop recorder if not.  He would like to get it all done today, so will implant today.

## 2012-09-11 NOTE — CV Procedure (Signed)
Preop Dx:  Syncope  Postop Dx: same/     LOOP RECORDER IMPLANT   After routine prep and drape of the left upper chest, lidocaine was infiltrated lateral to the sternum and caudal to the medial aspect of the clavicle. A poke incision was made.  The introducer tool was placed, the recorder  2 2-0 silk sutures were placed at the cephalad aspect of the pocket and used to secure a [facility]  Med[employer]onic loop recorder serial number [id number].  A benzoin Steri-Strip this was applied    The patient tolerated the procedure without apparent complication.

## 2012-09-12 ENCOUNTER — Telehealth: Payer: Self-pay | Admitting: Internal Medicine

## 2012-09-12 NOTE — Telephone Encounter (Signed)
New Prob   Would like to speak to nurse regarding this pt who is requesting an ICD insertion appeal. This is an expatiated appeal. She would like to verify this case.

## 2012-09-12 NOTE — Telephone Encounter (Signed)
New problem   Cindy from [employer](in response to an appeal) she spoke w/charmaine earlier and states she did not really get an answer to rather a test is needed for this pt or not(ICD IMPLANT)- needs an answer

## 2012-09-12 NOTE — Telephone Encounter (Signed)
Per Charmaine, all necessary paperwork has been sent to [place] at [facility].

## 2012-09-12 NOTE — Op Note (Signed)
NAME:  John Barton, John Barton NO.:  [id number]  MEDICAL RECORD NO.:  [id number]  LOCATION:  [facility]                         FACILITY:  [facility]  PHYSICIAN:  Deboraha Sprang, MD, FACCDATE OF BIRTH:  Jan 10, 1946  DATE OF PROCEDURE:  09/11/2012 DATE OF DISCHARGE:  09/11/2012                              OPERATIVE REPORT   PREOPERATIVE DIAGNOSIS:  Syncope with bifascicular block, ischemic heart disease with prior inferior wall myocardial infarction, normal/near normal left ventricular function.  POSTOPERATIVE DIAGNOSIS:  Inducible nonsustained ventricular tachycardia, typical atrioventricular nodal reentry, and atypical atrioventricular nodal reentry.  DESCRIPTION OF PROCEDURE:  Following obtaining informed consent, the patient was brought to electrophysiology laboratory and placed on the fluoroscopic table in supine position.  After routine prep and drape, cardiac catheterization was performed with local anesthesia.  Following the procedure, the catheters were removed.  Hemostasis was obtained and the patient was then prepared for loop recorder insertion.  Catheters of 5-French quadripolar catheter was inserted via the right femoral vein to the AV junction and measured His electrogram.  A 5-French quadripolar catheter was inserted via the right femoral vein to the  right atrium, moved to the right ventricular apex, then to the right ventricular outflow tract and then back to the right atrium.  Surface leads 1, aVF and V1 were monitored continuously throughout the procedure.  Following insertion of the catheters, a stimulation protocol included incremental atrial pacing.  Single atrial extrastimuli at paced cycle length of 600 and 500 milliseconds.  Single ventricular extrastimuli at paced cycle length of 400:600 milliseconds from right ventricular apex.  Double and triple ventricular extrastimuli at the right ventricular apex and the right ventricular outflow tract, a  paced cycle length of 600 and 400 milliseconds.  RESULTS: 1. Surface electrocardiogram.  Basic intervals.  Rhythm:  Sinus; RR     interval 1026 milliseconds; PR interval 228 millisecond; P-wave     duration 146 millisecond; QRS duration 151 milliseconds; QT     interval 536 millisecond. AH interval 121 milliseconds; HV interval 58 milliseconds.  Right bundle- branch block was present.  Final:  Sinus; RR interval:  923 milliseconds; PR interval 216 milliseconds; WPW duration 139 milliseconds; QRS duration 132 milliseconds; QT interval 461 milliseconds.  AH interval 110 milliseconds; HV:  56 milliseconds. Right bundle-branch block was present. 1. AV Wenckebach was less than 350 millisecond. 2. VA conduction was dissociated at 600 milliseconds.  Dual AV nodal physiology was identified both with jumps as well as PR longer than RR during atrial pacing.  Nonsustained slow-fast as well as fast-slow wave nodal reentry was seen.  This did not change with the infusion of isoproterenol.  Ventricular responsive programmed stimulation; nonsustained ventricular tachycardia was induced.  Sustained arrhythmias were not induced.  ACCESSORY PATHWAY:  No evidence of accessory pathway was identified.  IMPRESSION: 1. Normal sinus function apart from sinus bradycardia. 2. Fulminant atrial conduction x3, dual antegrade atrioventricular     nodal physiology with slow-fast and fast-slow nonsustained     atrioventricular nodal reentry.  Tachycardia sustained arrhythmias     were not induced with isoproterenol. 3. Mild prolongation of the HV interval that did not lengthen with  atrial pacing.  4.  No accessory pathway. 4. Nonsustained ventricular tachycardic.  In summary, the results of     electrophysiological testing failed to identify a certain substrate     for the patient's syncope.  We continued to have concerns regarding     ventricular tachycardia as well as conduction block and now with      the further clarification of SVT mechanisms.  We will plan to     insert a loop recorder.     Deboraha Sprang, MD, [facility]     SCK/MEDQ  D:  09/11/2012  T:  09/12/2012  Job:  [id number]

## 2012-09-12 NOTE — Telephone Encounter (Signed)
Needing answers to billing questions regarding loop recorder insertion.  Call forwarded to [facility] in pre-cert.

## 2012-09-16 ENCOUNTER — Telehealth: Payer: Self-pay | Admitting: *Deleted

## 2012-09-16 NOTE — Telephone Encounter (Signed)
Event monitor per  Dr. Mare Ferrari normal rhythm, advised patient

## 2012-09-18 ENCOUNTER — Ambulatory Visit (INDEPENDENT_AMBULATORY_CARE_PROVIDER_SITE_OTHER): Payer: Medicare Other | Admitting: *Deleted

## 2012-09-18 ENCOUNTER — Ambulatory Visit: Payer: Medicare Other

## 2012-09-18 ENCOUNTER — Other Ambulatory Visit: Payer: Self-pay

## 2012-09-18 ENCOUNTER — Encounter: Payer: Self-pay | Admitting: Internal Medicine

## 2012-09-18 DIAGNOSIS — R55 Syncope and collapse: Secondary | ICD-10-CM

## 2012-09-18 LAB — PACEMAKER DEVICE OBSERVATION

## 2012-09-18 NOTE — Progress Notes (Signed)
Wound check-ILR 

## 2012-12-22 ENCOUNTER — Encounter: Payer: Self-pay | Admitting: *Deleted

## 2012-12-23 ENCOUNTER — Telehealth: Payer: Self-pay | Admitting: Internal Medicine

## 2012-12-23 ENCOUNTER — Ambulatory Visit (INDEPENDENT_AMBULATORY_CARE_PROVIDER_SITE_OTHER): Payer: Medicare Other | Admitting: Internal Medicine

## 2012-12-23 ENCOUNTER — Encounter: Payer: Self-pay | Admitting: Internal Medicine

## 2012-12-23 VITALS — BP 117/54 | HR 52 | Ht 70.0 in | Wt 192.0 lb

## 2012-12-23 DIAGNOSIS — R55 Syncope and collapse: Secondary | ICD-10-CM

## 2012-12-23 DIAGNOSIS — I2510 Atherosclerotic heart disease of native coronary artery without angina pectoris: Secondary | ICD-10-CM

## 2012-12-23 DIAGNOSIS — I498 Other specified cardiac arrhythmias: Secondary | ICD-10-CM

## 2012-12-23 DIAGNOSIS — I452 Bifascicular block: Secondary | ICD-10-CM

## 2012-12-23 DIAGNOSIS — I471 Supraventricular tachycardia: Secondary | ICD-10-CM

## 2012-12-23 NOTE — Assessment & Plan Note (Signed)
Stable

## 2012-12-23 NOTE — Telephone Encounter (Signed)
New Problem  Pt states this was the medication that could not be located on his list during his visit today  Lisinopril 5 MG

## 2012-12-23 NOTE — Telephone Encounter (Addendum)
Lisinopril added to pt med list. Will make pharm md aware

## 2012-12-23 NOTE — Patient Instructions (Addendum)
You have a carelink transmission scheduled for March 30, 2013  Your physician wants you to follow-up in: March 2015 with Dr. Caryl Comes. You will receive a reminder letter in the mail two months in advance. If you don't receive a letter, please call our office to schedule the follow-up appointment.  We will call you next week to discuss quinaglute. Please call us on Friday if you do not hear from [place] by then.

## 2012-12-23 NOTE — Assessment & Plan Note (Signed)
No recurrent syncope  He is I. These episodes occurring in the context of quinidine are also worrisome. We have discussed discontinuing the quinidine which I would recommend. Alternative strategies for the wide-complex tachycardia which occurred in 2010 would need to be identified if they were to recur

## 2012-12-23 NOTE — Progress Notes (Signed)
Patient has no care team.   HPI  John Barton is a 67 y.o. male Seen in followup for loop recorder implantation.  He has a history of syncope with prior myocardial infarction and status post aortic valve replacement And underwent EP testing demonstrating only nonsustained ventricular tachycardia and nonsustained SVT  He has a history of a wide-complex tachycardia for which she was seen in consultation 2010. For reasons that are not particularly clear, he ended up on Quinaglute.a CBC with differential was obtained March 2014 which was normal  Past Medical History  Diagnosis Date  . Coronary artery disease     Prior myocardial infarction noted on Myoview 2010  . H/O paroxysmal supraventricular tachycardia     with wide complex  . Chronic kidney disease 09/10/1996    transplant done  . Hyperlipidemia   . Sinus bradycardia   . Gout     H/O  . COPD (chronic obstructive pulmonary disease)   . Hypertension   . Arthritis   . Carotid stenosis   . Trifascicular block     First degree AV block, right bundle branch block left anterior fascicular block    Past Surgical History  Procedure Laterality Date  . Coronary artery bypass graft  1995    RCA andLAD  . Aortic valve replacement  1995    [facility]Jude  . Cardiac catheterization  1995  . Parathyroidectomy      Current Outpatient Prescriptions  Medication Sig Dispense Refill  . allopurinol (ZYLOPRIM) 100 MG tablet Take 100-200 mg by mouth 2 (two) times daily. 1 tablet in the morning 2 tablets at night      . atorvastatin (LIPITOR) 80 MG tablet Take 40 mg by mouth daily.       . calcitRIOL (ROCALTROL) 0.25 MCG capsule Take 0.25 mcg by mouth daily.      . calcium carbonate (OS-CAL) 600 MG TABS Take 500 mg by mouth daily.       . furosemide (LASIX) 40 MG tablet Take 20 mg by mouth daily.       . Multiple Vitamins-Minerals (OCUVITE PO) Take by mouth daily.        . mycophenolate (CELLCEPT) 500 MG tablet Take 1,000 mg by mouth 2 (two)  times daily.       . Naproxen Sodium (ALEVE PO) Take 1 tablet by mouth daily as needed (pain).      . quiniDINE gluconate 324 MG CR tablet Take 1 tablet (324 mg total) by mouth 2 (two) times daily.  60 tablet  6  . varenicline (CHANTIX) 1 MG tablet Take 1 mg by mouth daily.      Marland Kitchen warfarin (COUMADIN) 5 MG tablet Take 7.5-10 mg by mouth daily. 10mg -Saturday and Sunday 7.5mg -All other days       No current facility-administered medications for this visit.    No Known Allergies  Review of Systems negative except from HPI and PMH  Physical Exam BP 117/54  Pulse 52  Ht 5\' 10"  (1.778 m)  Wt 192 lb (87.091 kg)  BMI 27.55 kg/m2 Well developed and well nourished in no acute distress HENT normal E scleral and icterus clear Neck Supple JVP flat; carotids brisk and full Clear to ausculation Device pocket well healed; without hematoma or erythema Regular rate and rhythm, no murmurs gallops or rub Soft with active bowel sounds No clubbing cyanosis none Edema Alert and oriented, grossly normal motor and sensory function Skin Warm and Dry  ECG demonstrates junctional rhythm with retrograde one-to-one  VA conduction and a rare PVC QT interval is prolonged  Assessment and  Plan

## 2012-12-23 NOTE — Assessment & Plan Note (Signed)
This is a wide complex tachycardia the mechanism of which I thought was atrial. Because of bradycardia in 2010 history of coronary disease it was elected to use Quinaglute. Now in the context of syncope, it becomes ominoous  We have reviewed his potential for life-threatening pro arrhythmia

## 2012-12-23 NOTE — Assessment & Plan Note (Signed)
Chronic bifascicular block. Today there is evidence of sinus node dysfunction with retrograde conduction

## 2012-12-31 ENCOUNTER — Telehealth: Payer: Self-pay | Admitting: *Deleted

## 2012-12-31 NOTE — Telephone Encounter (Signed)
Follow up  ° ° ° ° °Pt is returning your call  °

## 2012-12-31 NOTE — Telephone Encounter (Signed)
Left message for pt to call, per dr Caryl Comes we need to discuss quinaglute

## 2012-12-31 NOTE — Telephone Encounter (Signed)
Spoke with John Barton, he would like to know about taking the quinaglute once daily. Aware will discuss with dr Caryl Comes tomorrow and call the John Barton back. John Barton agreed with this plan.

## 2013-01-01 NOTE — Telephone Encounter (Signed)
Discussed with dr Caryl Comes, pt aware he can not go to once daily. The pt was told if he wanted to stop the quiniglute that he could. Pt voiced understanding.

## 2013-02-19 ENCOUNTER — Encounter: Payer: Self-pay | Admitting: Internal Medicine

## 2013-02-19 ENCOUNTER — Ambulatory Visit (INDEPENDENT_AMBULATORY_CARE_PROVIDER_SITE_OTHER): Payer: Medicare Other | Admitting: *Deleted

## 2013-02-19 DIAGNOSIS — R55 Syncope and collapse: Secondary | ICD-10-CM

## 2013-03-14 LAB — PACEMAKER DEVICE OBSERVATION

## 2013-03-23 ENCOUNTER — Ambulatory Visit (INDEPENDENT_AMBULATORY_CARE_PROVIDER_SITE_OTHER): Payer: Medicare Other | Admitting: *Deleted

## 2013-03-23 DIAGNOSIS — R55 Syncope and collapse: Secondary | ICD-10-CM

## 2013-03-27 LAB — PACEMAKER DEVICE OBSERVATION

## 2013-04-13 ENCOUNTER — Encounter: Payer: Self-pay | Admitting: Internal Medicine

## 2013-04-22 ENCOUNTER — Ambulatory Visit (INDEPENDENT_AMBULATORY_CARE_PROVIDER_SITE_OTHER): Payer: Medicare Other | Admitting: *Deleted

## 2013-04-22 DIAGNOSIS — R55 Syncope and collapse: Secondary | ICD-10-CM

## 2013-04-22 LAB — MDC_IDC_ENUM_SESS_TYPE_REMOTE

## 2013-05-22 ENCOUNTER — Ambulatory Visit (INDEPENDENT_AMBULATORY_CARE_PROVIDER_SITE_OTHER): Payer: Medicare Other | Admitting: *Deleted

## 2013-05-22 DIAGNOSIS — R55 Syncope and collapse: Secondary | ICD-10-CM

## 2013-06-15 ENCOUNTER — Encounter: Payer: Self-pay | Admitting: Internal Medicine

## 2013-06-23 ENCOUNTER — Ambulatory Visit (INDEPENDENT_AMBULATORY_CARE_PROVIDER_SITE_OTHER): Payer: Medicare Other | Admitting: *Deleted

## 2013-06-23 DIAGNOSIS — R55 Syncope and collapse: Secondary | ICD-10-CM

## 2013-07-24 ENCOUNTER — Ambulatory Visit (INDEPENDENT_AMBULATORY_CARE_PROVIDER_SITE_OTHER): Payer: Medicare Other | Admitting: *Deleted

## 2013-07-24 DIAGNOSIS — R55 Syncope and collapse: Secondary | ICD-10-CM

## 2013-07-24 LAB — MDC_IDC_ENUM_SESS_TYPE_REMOTE

## 2013-08-11 LAB — MDC_IDC_ENUM_SESS_TYPE_REMOTE

## 2013-08-12 LAB — MDC_IDC_ENUM_SESS_TYPE_REMOTE

## 2013-08-24 ENCOUNTER — Ambulatory Visit (INDEPENDENT_AMBULATORY_CARE_PROVIDER_SITE_OTHER): Payer: Medicare Other | Admitting: *Deleted

## 2013-08-24 DIAGNOSIS — R55 Syncope and collapse: Secondary | ICD-10-CM

## 2013-08-24 LAB — MDC_IDC_ENUM_SESS_TYPE_REMOTE

## 2013-09-14 ENCOUNTER — Encounter: Payer: Self-pay | Admitting: *Deleted

## 2013-09-21 ENCOUNTER — Encounter: Payer: Self-pay | Admitting: Internal Medicine

## 2013-09-22 ENCOUNTER — Ambulatory Visit (INDEPENDENT_AMBULATORY_CARE_PROVIDER_SITE_OTHER): Payer: Medicare Other | Admitting: Internal Medicine

## 2013-09-22 ENCOUNTER — Encounter: Payer: Self-pay | Admitting: Internal Medicine

## 2013-09-22 VITALS — BP 151/83 | HR 77 | Ht 71.0 in | Wt 197.0 lb

## 2013-09-22 DIAGNOSIS — R55 Syncope and collapse: Secondary | ICD-10-CM

## 2013-09-22 DIAGNOSIS — Z4509 Encounter for adjustment and management of other cardiac device: Secondary | ICD-10-CM

## 2013-09-22 MED ORDER — WARFARIN SODIUM 5 MG PO TABS: 7.5000 mg | ORAL_TABLET | Freq: Every day | ORAL | Status: DC

## 2013-09-22 NOTE — Patient Instructions (Addendum)
Your Carelink transmitter will continue to monitor your heart rhythms daily. Please record symptoms as you have them. Please call with concerns.  Your physician recommends that you schedule a follow-up appointment in: 12 months with Dr. Caryl Comes  Your physician recommends that you continue on your current medications as directed. Please refer to the Current Medication list given to you today.

## 2013-09-22 NOTE — Progress Notes (Signed)
Patient has no care team.   HPI  John Barton is a 68 y.o. male Seen in followup for loop recorder implantation.  He has a history of syncope with prior myocardial infarction and status post mechanical aortic valve replacement And underwent EP testing demonstrating only nonsustained ventricular tachycardia and nonsustained SVT  He has a history of a wide-complex tachycardia for which he was seen in consultation 2010. For reasons that are not particularly clear, he ended up on Quinaglute.a CBC with differential was obtained March 2014 which was normal    Past Medical History  Diagnosis Date  . Coronary artery disease     Prior myocardial infarction noted on Myoview 2010  . H/O paroxysmal supraventricular tachycardia     with wide complex  . Chronic kidney disease 09/10/1996    transplant done  . Hyperlipidemia   . Sinus bradycardia   . Gout     H/O  . COPD (chronic obstructive pulmonary disease)   . Hypertension   . Arthritis   . Carotid stenosis   . Trifascicular block     First degree AV block, right bundle branch block left anterior fascicular block    Past Surgical History  Procedure Laterality Date  . Coronary artery bypass graft  1995    RCA andLAD  . Aortic valve replacement  1995    [facility]Jude  . Cardiac catheterization  1995  . Parathyroidectomy      Current Outpatient Prescriptions  Medication Sig Dispense Refill  . allopurinol (ZYLOPRIM) 100 MG tablet Take 100-200 mg by mouth 2 (two) times daily. 1 tablet in the morning 2 tablets at night      . atorvastatin (LIPITOR) 80 MG tablet Take 40 mg by mouth daily.       . calcitRIOL (ROCALTROL) 0.25 MCG capsule Take 0.25 mcg by mouth daily.      . calcium carbonate (OS-CAL) 600 MG TABS Take 500 mg by mouth daily.       . furosemide (LASIX) 40 MG tablet Take 20 mg by mouth daily.       Marland Kitchen lisinopril (PRINIVIL,ZESTRIL) 5 MG tablet Take 5 mg by mouth daily.      . Multiple Vitamins-Minerals (OCUVITE PO) Take  by mouth daily.        . mycophenolate (CELLCEPT) 500 MG tablet Take 1,000 mg by mouth 2 (two) times daily.       . Naproxen Sodium (ALEVE PO) Take 1 tablet by mouth daily as needed (pain).      . quiniDINE gluconate 324 MG CR tablet Take 1 tablet (324 mg total) by mouth 2 (two) times daily.  60 tablet  6  . varenicline (CHANTIX) 1 MG tablet Take 1 mg by mouth daily.      Marland Kitchen warfarin (COUMADIN) 5 MG tablet Take 7.5-10 mg by mouth daily. 10mg -Saturday and Sunday 7.5mg -All other days       No current facility-administered medications for this visit.    No Known Allergies  Review of Systems negative except from HPI and PMH  Physical Exam BP 151/83  Pulse 77  Ht 5\' 11"  (1.803 m)  Wt 197 lb (89.359 kg)  BMI 27.49 kg/m2 Well developed and nourished in no acute distress HENT normal Neck supple with JVP-flat Clear Regular rate and rhythm, no murmurs or gallops Abd-soft with active BS No Clubbing cyanosis edema Skin-warm and dry A & Oriented  Grossly normal sensory and motor function     Assessment and  Plan  Syncope  Wide-complex tachycardia  Nocturnal bradycardia  Loop recorder insertion  Ischemic heart disease and depressed left ventricular function  Aortic valve replacement-mechanical  Kidney transplant  The only arrhythmia noted was nocturnal bradycardia.Therapy is recommended. We'll continue him on three-month interrogation

## 2013-09-24 ENCOUNTER — Ambulatory Visit (INDEPENDENT_AMBULATORY_CARE_PROVIDER_SITE_OTHER): Payer: Medicare Other | Admitting: *Deleted

## 2013-09-24 DIAGNOSIS — R55 Syncope and collapse: Secondary | ICD-10-CM

## 2013-09-24 LAB — MDC_IDC_ENUM_SESS_TYPE_INCLINIC
MDC IDC SESS DTM: 20150407190216
MDC IDC SET ZONE DETECTION INTERVAL: 2000 ms
MDC IDC SET ZONE DETECTION INTERVAL: 370 ms
Zone Setting Detection Interval: 3000 ms

## 2013-09-28 ENCOUNTER — Encounter: Payer: Self-pay | Admitting: Nurse Practitioner

## 2013-09-28 ENCOUNTER — Ambulatory Visit (INDEPENDENT_AMBULATORY_CARE_PROVIDER_SITE_OTHER): Payer: Medicare Other | Admitting: Nurse Practitioner

## 2013-09-28 VITALS — BP 115/72 | HR 58 | Temp 97.7°F | Ht 71.0 in | Wt 195.0 lb

## 2013-09-28 DIAGNOSIS — H6120 Impacted cerumen, unspecified ear: Secondary | ICD-10-CM

## 2013-09-28 DIAGNOSIS — J989 Respiratory disorder, unspecified: Secondary | ICD-10-CM

## 2013-09-28 MED ORDER — GUAIFENESIN 400 MG PO TABS: ORAL_TABLET | ORAL | Status: DC

## 2013-09-28 NOTE — Patient Instructions (Addendum)
To soften ear wax, flush ears with warm water for 4-5 days daily when getting into shower. If wax does not come out, come back & we will flush again. Use Milta Deiters med sinus rinse daily to clear sinuses & decrease post-nasal drip. You may use mucinex to help break up congestion. Lungs sound clear. Please schedule physical when convenient.  Cerumen Impaction A cerumen impaction is when the wax in your ear forms a plug. This plug usually causes reduced hearing. Sometimes it also causes an earache or dizziness. Removing a cerumen impaction can be difficult and painful. The wax sticks to the ear canal. The canal is sensitive and bleeds easily. If you try to remove a heavy wax buildup with a cotton tipped swab, you may push it in further. Irrigation with water, suction, and small ear curettes may be used to clear out the wax. If the impaction is fixed to the skin in the ear canal, ear drops may be needed for a few days to loosen the wax. People who build up a lot of wax frequently can use ear wax removal products available in your local drugstore. SEEK MEDICAL CARE IF:  You develop an earache, increased hearing loss, or marked dizziness. Document Released: 07/12/2004 Document Revised: 08/27/2011 Document Reviewed: 09/01/2009 [facility] Patient Information 2014 [place], [place].

## 2013-09-28 NOTE — Progress Notes (Signed)
Pre visit review using our clinic review tool, if applicable. No additional management support is needed unless otherwise documented below in the visit note. 

## 2013-09-29 ENCOUNTER — Telehealth: Payer: Self-pay | Admitting: Nurse Practitioner

## 2013-09-29 NOTE — Telephone Encounter (Signed)
Relevant patient education mailed to patient.  

## 2013-10-04 ENCOUNTER — Encounter: Payer: Self-pay | Admitting: Nurse Practitioner

## 2013-10-04 DIAGNOSIS — H6120 Impacted cerumen, unspecified ear: Secondary | ICD-10-CM | POA: Insufficient documentation

## 2013-10-04 NOTE — Progress Notes (Signed)
   Subjective:    Patient ID: John Barton, male    DOB: 01-20-1946, 68 y.o.   MRN: [id number]  HPI Comments: John Barton wishes to establish care. 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 Additionally, he is followed by cardiology for MV replacement. He has not had primary care. Today he presents with R ear fullness.   Ear Fullness  There is pain in the right ear. This is a recurrent problem. The current episode started in the past 7 days. The problem occurs constantly. The problem has been gradually worsening. There has been no fever. The patient is experiencing no pain. Pertinent negatives include no coughing, ear discharge, headaches, hearing loss, neck pain or sore throat. He has tried nothing for the symptoms.      Review of Systems  Constitutional: Negative for fever, chills, activity change, appetite change and fatigue.  HENT: Negative for congestion, ear discharge, hearing loss, sore throat and tinnitus.   Respiratory: Negative for cough.   Musculoskeletal: Negative for neck pain.  Neurological: Negative for headaches.       Objective:   Physical Exam  Vitals reviewed. Constitutional: He is oriented to person, place, and time. He appears well-developed and well-nourished. No distress.  HENT:  Head: Normocephalic and atraumatic.  Right Ear: External ear normal.  Left Ear: External ear normal.  Mouth/Throat: Oropharynx is clear and moist. No oropharyngeal exudate.  Unable to visualize R TM due to ceruminosis. LTM clear, bones visible.  Eyes: Conjunctivae are normal. Right eye exhibits no discharge. Left eye exhibits no discharge.  Neck: Normal range of motion. Neck supple. No thyromegaly present.  Cardiovascular: Normal rate, regular rhythm and intact distal pulses.   No murmur heard. Click heard loudest at aortic space.   Pulmonary/Chest: Effort normal and breath sounds normal. No respiratory distress. He has no wheezes.    Musculoskeletal: Normal range of motion.  Lymphadenopathy:    He has no cervical adenopathy.  Neurological: He is alert and oriented to person, place, and time.  Skin: Skin is warm and dry.  Psychiatric: He has a normal mood and affect. His behavior is normal. Thought content normal.          Assessment & Plan:  1. Ceruminosis R ear. Water flush unsuccessful. Removed moderate amount with curette. Still unable to see TM. Continue to flush ear with warm water daily or use debrox OTC. Return if no relief.  2. Respiratory illness Likely viral. Lung sounds clear.  - guaifenesin (HUMIBID E) 400 MG TABS tablet; Take 1t po q8h for 5 days then q8h PRN chest congestion  Dispense: 56 tablet; Refill: 0 See pt instructions.

## 2013-10-05 ENCOUNTER — Encounter: Payer: Medicare Other | Admitting: Nurse Practitioner

## 2013-10-09 ENCOUNTER — Encounter: Payer: Self-pay | Admitting: Nurse Practitioner

## 2013-10-09 ENCOUNTER — Ambulatory Visit (INDEPENDENT_AMBULATORY_CARE_PROVIDER_SITE_OTHER): Payer: Medicare Other | Admitting: Nurse Practitioner

## 2013-10-09 VITALS — BP 131/78 | HR 69 | Temp 97.8°F | Resp 18 | Ht 71.0 in | Wt 196.0 lb

## 2013-10-09 DIAGNOSIS — Z9189 Other specified personal risk factors, not elsewhere classified: Secondary | ICD-10-CM

## 2013-10-09 DIAGNOSIS — S43499A Other sprain of unspecified shoulder joint, initial encounter: Secondary | ICD-10-CM

## 2013-10-09 DIAGNOSIS — Z789 Other specified health status: Secondary | ICD-10-CM

## 2013-10-09 DIAGNOSIS — S46819A Strain of other muscles, fascia and tendons at shoulder and upper arm level, unspecified arm, initial encounter: Secondary | ICD-10-CM

## 2013-10-09 DIAGNOSIS — H6120 Impacted cerumen, unspecified ear: Secondary | ICD-10-CM

## 2013-10-09 NOTE — Patient Instructions (Addendum)
I think you have ruptired a muscle, possibly the deltoid. You have an appointment with Dr Tamala Julian at [facility] at 11:00 am. In the meantime use moist heat (rice w/sock) 3 times daily. Wear compression sleeve 24 hours. Keep arm in sling when up & about. Use tylenol for pain if needed. No lifting with that arm, but OK to do gentle stretches.  Keep flushing ear with warm water when getting into shower.

## 2013-10-09 NOTE — Progress Notes (Signed)
Pre visit review using our clinic review tool, if applicable. No additional management support is needed unless otherwise documented below in the visit note. 

## 2013-10-11 DIAGNOSIS — Z9189 Other specified personal risk factors, not elsewhere classified: Secondary | ICD-10-CM | POA: Insufficient documentation

## 2013-10-11 DIAGNOSIS — S46819A Strain of other muscles, fascia and tendons at shoulder and upper arm level, unspecified arm, initial encounter: Secondary | ICD-10-CM | POA: Insufficient documentation

## 2013-10-11 NOTE — Progress Notes (Signed)
   Subjective:    Patient ID: John Barton, male    DOB: Oct 26, 1945, 68 y.o.   MRN: [id number]  HPI Comments: Still c/o R ear fullness. Flushing w/warm water several times at home.   Shoulder Pain  The pain is present in the left arm and left shoulder. This is a new problem. The current episode started in the past 7 days (4days). There has been a history of trauma (Using weed eater when developed pain in upper L arm. Ignored pain, continued w/activity although noticed activity was more difficult due to decreased strength.). The problem occurs constantly. The problem has been gradually worsening. The quality of the pain is described as aching and dull. The pain is mild. Associated symptoms include a limited range of motion. Pertinent negatives include no fever, inability to bear weight, joint locking, joint swelling, numbness, stiffness or tingling. Associated symptoms comments: Decreased ROM in shoulder, swelling of upper L arm . The symptoms are aggravated by activity. He has tried nothing for the symptoms. His past medical history is significant for gout. kidney transplant, taking anti-rejections med., coumadin      Review of Systems  Constitutional: Positive for activity change (not using L arm due to pain). Negative for fever, chills and fatigue.  Respiratory: Negative for cough and shortness of breath.   Cardiovascular: Negative for chest pain.  Musculoskeletal: Positive for gout. Negative for stiffness.       Last gout attack few years ago. L arm & shoulder pain X 4d  Skin: Negative for color change and wound.  Neurological: Negative for tingling and numbness.       Objective:   Physical Exam  Vitals reviewed. Constitutional: He is oriented to person, place, and time. He appears well-developed and well-nourished. No distress.  HENT:  Head: Normocephalic and atraumatic.  Left Ear: External ear normal.  Unable to visualize R TM due to ceruminosis. Flushed w/warm water,  Unsuccessful. Removed moderate amount w/currette. Still unable to visualize TM. No trauma to canal.    Eyes: Conjunctivae are normal. Right eye exhibits no discharge. Left eye exhibits no discharge.  Cardiovascular: Normal rate.   Pulmonary/Chest: Effort normal. No respiratory distress.  Musculoskeletal: He exhibits edema and tenderness.       Arms: Large dark purple bruise over L deltoid & bicep. (Pt unaware of bruise). Tender at distal deltoid & proximal biceps insertion points. Moderate swelling, tissue feels tight. Unable to laterally extend arm at 90 degrees. Loss of control of arm when lowers from overhead position. Good strength against resistence with flexion at elbow.  Neurological: He is alert and oriented to person, place, and time.  Skin: Skin is warm and dry.  Psychiatric: He has a normal mood and affect. His behavior is normal. Thought content normal.          Assessment & Plan:  1. Tear of deltoid muscle DD: biceps rupture/tear, rotator pathology, compartment syndrome Risk for bleed as anti-coagulated. Discussed w/Dr Charlann Boxer, feels compartment syndrome unlikely. Will see pt on Monday. compression sleeve, sling, gentle stretches, heat, follow up w/Dr Tamala Julian on Monday, Tylenol.  2. Ceruminosis Continue to flush w/warm water several times/week.

## 2013-10-12 ENCOUNTER — Ambulatory Visit (INDEPENDENT_AMBULATORY_CARE_PROVIDER_SITE_OTHER): Payer: Medicare Other | Admitting: Family Medicine

## 2013-10-12 ENCOUNTER — Encounter: Payer: Self-pay | Admitting: Family Medicine

## 2013-10-12 ENCOUNTER — Encounter: Payer: Medicare Other | Admitting: Nurse Practitioner

## 2013-10-12 ENCOUNTER — Other Ambulatory Visit (INDEPENDENT_AMBULATORY_CARE_PROVIDER_SITE_OTHER): Payer: Medicare Other

## 2013-10-12 VITALS — BP 126/72 | HR 73 | Wt 195.0 lb

## 2013-10-12 DIAGNOSIS — S46819A Strain of other muscles, fascia and tendons at shoulder and upper arm level, unspecified arm, initial encounter: Secondary | ICD-10-CM

## 2013-10-12 DIAGNOSIS — M719 Bursopathy, unspecified: Secondary | ICD-10-CM

## 2013-10-12 DIAGNOSIS — S43499A Other sprain of unspecified shoulder joint, initial encounter: Secondary | ICD-10-CM

## 2013-10-12 DIAGNOSIS — M75102 Unspecified rotator cuff tear or rupture of left shoulder, not specified as traumatic: Secondary | ICD-10-CM

## 2013-10-12 DIAGNOSIS — M12812 Other specific arthropathies, not elsewhere classified, left shoulder: Secondary | ICD-10-CM

## 2013-10-12 DIAGNOSIS — M67919 Unspecified disorder of synovium and tendon, unspecified shoulder: Secondary | ICD-10-CM

## 2013-10-12 DIAGNOSIS — M19019 Primary osteoarthritis, unspecified shoulder: Secondary | ICD-10-CM

## 2013-10-12 DIAGNOSIS — M7580 Other shoulder lesions, unspecified shoulder: Secondary | ICD-10-CM

## 2013-10-12 NOTE — Assessment & Plan Note (Signed)
Patient does have severe weakness as well as atrophy of the muscle surrounding the left shoulder. Patient does have what appears to be retraction of a chronic rotator cuff tear with underlying osteoarthritic changes of the shoulder joint. I believe the patient has had this problem for quite some time and was compensating with his pectoralis tendon. Patient is on chronic coagulation which is going to make healing a little slower and bruising likely last somewhere between 4 and 12 weeks. Discussed with patient at this time that likely rotator cuff repair would not be a possibility he would be looking for shoulder replacement surgery. Patient has had this weakness for quite some time and would like to try conservative approach and see if how much improvement he gets. Patient will try home exercise program for range of motion exercises and come back in 2 weeks. If he continues to have pain we can try an intra-articular injection. Patient may need surgical intervention at some point. Patient understands.

## 2013-10-12 NOTE — Patient Instructions (Signed)
Good to meet you You tore your pectoralis tendon and you have a chronic rotato cuff tear with severe arthritis of the shoulder Turmeric 500mg  twice daily can help with pain.  Try not to do any heavy lifting or pushing for next 2 weeks.  Ice no heat.  Come back in 2 weeks to check your strength again.

## 2013-10-12 NOTE — Assessment & Plan Note (Signed)
Patient does have a pectoralis tendon rupture it seems to be partial. Patient was compensating for his rotator cuff tear with this muscle and now is having more difficulty. We discussed different treatment options we decided a more conservative approach and see the patient heels. Patient given range of motion exercises to see if this would be helpful. The discussed icing and over-the-counter medicines are to be helpful. Patient come back in 2 weeks for further evaluation. We may want to do an ultrasound to make sure that healing is occurring at this time.

## 2013-10-12 NOTE — Progress Notes (Signed)
[facility] [address] [place], [place] [address] Phone: [phone] Subjective:    I'm seeing this patient by the request  of:  WEAVER, LAYNE C, NP   CC: Left shoulder pain and swelling  [id number] John Barton is a 68 y.o. male coming in with complaint of left shoulder pain and swelling. Patient states that this happened one week ago. Patient was working outside she notices straining in his left shoulder region. Patient states that he went inside after finishing up all his work and he noticed some significant swelling that was occurring on the anterior aspect of his shoulder. Patient states since that time and since he seen his primary care provider it has started to go down slowly. Patient's past medical history is significant for gout as well as coronary artery disease and on chronic anticoagulation. Patient states over the course of time the pain is almost completely resolved at night he still has some mild swelling as well as some bruising. Patient has noticed a mild increase in weakness but he has had weakness in the shoulder for 2 years. Patient has been told that he had a tendon that was torn previously but denies that it is the rotator cuff tear. Has a history of right-sided rotator cuff repair previously.     Past medical history, social, surgical and family history all reviewed in electronic medical record.   Review of Systems: No headache, visual changes, nausea, vomiting, diarrhea, constipation, dizziness, abdominal pain, skin rash, fevers, chills, night sweats, weight loss, swollen lymph nodes, body aches, joint swelling, muscle aches, chest pain, shortness of breath, mood changes.   Objective Blood pressure 126/72, pulse 73, weight 195 lb (88.451 kg), SpO2 96.00%.  General: No apparent distress alert and oriented x3 mood and affect normal, dressed appropriately.  HEENT: Pupils equal, extraocular movements intact  Respiratory: Patient's  speak in full sentences and does not appear short of breath  Cardiovascular: No lower extremity edema, non tender, no erythema  Skin: Warm dry intact with no signs of infection or rash on extremities or on axial skeleton.  Abdomen: Soft nontender  Neuro: Cranial nerves II through XII are intact, neurovascularly intact in all extremities with 2+ DTRs and 2+ pulses.  Lymph: No lymphadenopathy of posterior or anterior cervical chain or axillae bilaterally.  Gait normal with good balance and coordination.  MSK:  Non tender with full range of motion and good stability and symmetric strength and tone of  elbows, wrist, hip, knee and ankles bilaterally.  Shoulder: Left Inspection reveals patient does have some bruising on the anterior superior aspect of the humerus mostly near the soldier region that does expand into the chest. This seems to be resolving fairly well. Trace effusion of the soft tissue in this area compared to the contralateral side patient also has significant atrophy of the shoulder girdle compared to the contralateral side. Palpation is normal with no tenderness over AC joint or bicipital groove. Actively patient only has forward flexion to 80 with a positive drop arm. Patient does have full internal and external range of motion with mild crepitus. Rotator cuff 35 strength compared to 4-5 on the contralateral side the No signs of impingement with negative Neer and Hawkin's tests, empty can sign. Pain with resisted adduction of the arm Speeds and Yergason's tests normal.  Normal scapular function observed. Positive drop arm. Contralateral shoulder unremarkable except for some mild weakness.  MSK US performed of: Left shoulder This study was ordered,  performed, and interpreted by Charlann Boxer D.O.  Shoulder:   Supraspinatus:  Patient does have significant atrophy of the supraspinatus tendon with retraction of approximately 2-1/2 cm. This appears to be more of a chronic tear with mild  hypoechoic changes in the area. Significant fat disposition noted. Infraspinatus:  Appears normal on long and transverse views. Significant atrophy Subscapularis:  Appears normal on long and transverse views. Significant atrophy Teres Minor:  Appears normal on long and transverse views. AC joint:  Distended with moderate arthritis. Glenohumeral Joint: Moderate to severe osteoarthritic changes Glenoid Labrum:  Intact without visualized tears. Biceps Tendon:  Patient's tendon does appear to be intact but does have significant hypoechoic changes in this area. The patient though does have what appears to be a very large seroma that seems to go where the pectoralis tendon would be inserted. Mild increasing Doppler flow but no avulsion noted.  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        Impression and Recommendations:     This case required medical decision making of moderate complexity.

## 2013-10-23 ENCOUNTER — Ambulatory Visit (INDEPENDENT_AMBULATORY_CARE_PROVIDER_SITE_OTHER): Payer: Medicare Other | Admitting: *Deleted

## 2013-10-23 DIAGNOSIS — R55 Syncope and collapse: Secondary | ICD-10-CM

## 2013-10-23 LAB — MDC_IDC_ENUM_SESS_TYPE_REMOTE
Date Time Interrogation Session: 20150516125434
MDC IDC SET ZONE DETECTION INTERVAL: 370 ms
Zone Setting Detection Interval: 2000 ms
Zone Setting Detection Interval: 3000 ms

## 2013-10-26 ENCOUNTER — Other Ambulatory Visit (INDEPENDENT_AMBULATORY_CARE_PROVIDER_SITE_OTHER): Payer: Medicare Other

## 2013-10-26 ENCOUNTER — Encounter: Payer: Self-pay | Admitting: Family Medicine

## 2013-10-26 ENCOUNTER — Ambulatory Visit (INDEPENDENT_AMBULATORY_CARE_PROVIDER_SITE_OTHER): Payer: Medicare Other | Admitting: Family Medicine

## 2013-10-26 VITALS — BP 116/64 | HR 69

## 2013-10-26 DIAGNOSIS — M719 Bursopathy, unspecified: Secondary | ICD-10-CM

## 2013-10-26 DIAGNOSIS — S43499A Other sprain of unspecified shoulder joint, initial encounter: Secondary | ICD-10-CM

## 2013-10-26 DIAGNOSIS — M67919 Unspecified disorder of synovium and tendon, unspecified shoulder: Secondary | ICD-10-CM

## 2013-10-26 DIAGNOSIS — S46819A Strain of other muscles, fascia and tendons at shoulder and upper arm level, unspecified arm, initial encounter: Secondary | ICD-10-CM

## 2013-10-26 DIAGNOSIS — M7580 Other shoulder lesions, unspecified shoulder: Secondary | ICD-10-CM

## 2013-10-26 NOTE — Assessment & Plan Note (Signed)
Patient is improving from his partial rupture. Once again patient though does have a full-thickness tear of the rotator cuff and has been at least 2 years with the amount of atrophy seen. Patient will continue the exercises 3 times a week and patient's nose that the bruising will take another 3-6 weeks to completely resolve. Patient does well he can followup as needed otherwise to come back in 4 weeks. We cannot do an intra-articular injection into the left shoulder if he has any pain but he will likely be always a conservative approach with an avid significant comorbidities. Patient did need surgery for the shoulder he would be a shoulder replacement surgery.  Spent greater than 25 minutes with patient face-to-face and had greater than 50% of counseling including as described above in assessment and plan.

## 2013-10-26 NOTE — Patient Instructions (Signed)
Good to see you You are healing slowly.  You can start more exercises if you can tolerate.  Bruising will take 4-6 weeks If any pain in the shoulder we can do injection.  Try exercises for groin 3 times a week See me in 4 weeks if not perfect

## 2013-10-26 NOTE — Progress Notes (Signed)
[facility] [address] [place], [place] [address] Phone: [phone] Subjective:    CC: Left shoulder pain and swelling follow up  [id number] John Barton is a 68 y.o. male coming in with complaint of left shoulder pain and swelling. Patient was seen previously and had a chronic full thickness rotator cuff tear and a new onset tear of the pectoralis major tendon. Patient states that the pain seemed to subside very quickly and states that the bruising is getting better. Patient is also noticed a mild increase in strength again. Patient denies any symptoms such as radiation down the arm or any numbness. Overall patient is feeling better.     Past medical history, social, surgical and family history all reviewed in electronic medical record.   Review of Systems: No headache, visual changes, nausea, vomiting, diarrhea, constipation, dizziness, abdominal pain, skin rash, fevers, chills, night sweats, weight loss, swollen lymph nodes, body aches, joint swelling, muscle aches, chest pain, shortness of breath, mood changes.   Objective Blood pressure 116/64, pulse 69, SpO2 95.00%.  General: No apparent distress alert and oriented x3 mood and affect normal, dressed appropriately.  HEENT: Pupils equal, extraocular movements intact  Respiratory: Patient's speak in full sentences and does not appear short of breath  Cardiovascular: No lower extremity edema, non tender, no erythema  Skin: Warm dry intact with no signs of infection or rash on extremities or on axial skeleton.  Abdomen: Soft nontender  Neuro: Cranial nerves II through XII are intact, neurovascularly intact in all extremities with 2+ DTRs and 2+ pulses.  Lymph: No lymphadenopathy of posterior or anterior cervical chain or axillae bilaterally.  Gait normal with good balance and coordination.  MSK:  Non tender with full range of motion and good stability and symmetric strength and tone of  elbows,  wrist, hip, knee and ankles bilaterally.  Shoulder: Left Inspection reveals patient is presenting is considerable better than previous exam. Palpation is normal with no tenderness over AC joint or bicipital groove. Actively patient only has forward flexion to 85 with a positive drop arm. Patient does have full internal and external range of motion with mild crepitus. Rotator cuff 3/5 strength compared to 4-5 on the contralateral side the No signs of impingement with negative Neer and Hawkin's tests, empty can sign. Pain with resisted adduction of the arm Speeds and Yergason's tests normal.  Normal scapular function observed. Positive drop arm. Contralateral shoulder unremarkable except for some mild weakness.  MSK US performed of: Left shoulder This study was ordered, performed, and interpreted by Charlann Boxer D.O.  Shoulder:   Supraspinatus:  Patient does have significant atrophy of the supraspinatus tendon with retraction of approximately 2-1/2 cm. This appears to be more of a chronic tear with mild hypoechoic changes in the area. Significant fat disposition noted. No change from previous exam. Infraspinatus:  Appears normal on long and transverse views. Significant atrophy Subscapularis:  Appears normal on long and transverse views. Significant atrophy Teres Minor:  Appears normal on long and transverse views. AC joint:  Distended with moderate arthritis. Glenohumeral Joint: Moderate to severe osteoarthritic changes Glenoid Labrum:  Intact without visualized tears. Biceps Tendon:  Patient's tendon does appear to be intact but does have significant hypoechoic changes in this area.  Patient's previous seroma noted on previous exam is completely resolved. Patient actually does have some fibers of the pectoralis tendon intact. Patient does have scar tissue formation noted but still has a tear in approximately 30%  of the tendon itself. Impression: Chronic rotator cuff arthropathy with healing  pectoralis tendon tear.    Impression and Recommendations:     This case required medical decision making of moderate complexity.

## 2013-11-03 ENCOUNTER — Encounter: Payer: Self-pay | Admitting: Internal Medicine

## 2013-11-09 LAB — MDC_IDC_ENUM_SESS_TYPE_REMOTE
MDC IDC SESS DTM: 20150415022325
MDC IDC SET ZONE DETECTION INTERVAL: 370 ms
Zone Setting Detection Interval: 2000 ms
Zone Setting Detection Interval: 3000 ms

## 2013-11-17 LAB — COMPLETE METABOLIC PANEL WITH GFR
ALK PHOS: 101 U/L
ALT: 23 U/L (ref 10–40)
AST: 25 U/L
Albumin/Globulin Ratio: 1.7
Albumin: 4.5
BUN/Creatinine Ratio: 22
BUN: 29 mg/dL — AB (ref 4–21)
Bilirubin, Total: 0.4
CALCIUM: 10 mg/dL
CARBON DISULFIDE: 28
CHLORIDE: 100 mmol/L
CREATININE: 1.34
GFR, Est African American: 62
GFR, Est Non African American: 54
GLOBULIN, TOTAL: 2.7
Glucose: 104
PHOSPHORUS: 3 mg/dL (ref 2.5–4.9)
Potassium: 5.3 mmol/L
SODIUM: 137 mmol/L (ref 137–147)
Total Protein: 7.2 g/dL

## 2013-11-17 LAB — URINALYSIS W MICROSCOPIC (NOT AT ARMC)
Bilirubin, UA: NEGATIVE
Glucose: NEGATIVE
KETONES: NEGATIVE
NITRITE UA: NEGATIVE
OCCULT BLOOD: NEGATIVE
PH: 5.5
Protein: NEGATIVE
UUROB: 0.2
WBC UA: NEGATIVE

## 2013-11-17 LAB — CBC
BASOS ABS: 0 /uL
BASOS ABS: 1 /uL
Eosinophils Absolute: 0 /uL
Eosinophils Relative: 2 % (ref 0–6)
HCT: 44 %
HEMOGLOBIN (KUC): 15.1 g/dL (ref 13–17)
LYMPHOCYTES RELATIVE % (KUC): 16 % (ref 15–45)
Lymphs Abs: 1.1
MCH: 29.6
MCHC: 34.4
MCV: 86 fL (ref 80–98)
MONOCYTES ABSOLUTE (KUC): 0.6 10*3/uL (ref 0.1–1)
Monocytes relative %: 8 % (ref 2–10)
NEUTROPHILS ABSOLUTE (KUC): 4.9 10*3/uL (ref 1.7–7.7)
Neutrophils relative % (GR): 73 % (ref 44–76)
Platelets: 174
RBC: 5.1
RDW: 14.2
WBC: 6.7

## 2013-11-17 LAB — PROTHROMBIN TIME
INR: 2.6
Prothrombin Time: 28.6

## 2013-11-17 LAB — BODY FLUID CRYSTAL: Uric Acid: 6.6

## 2013-11-20 ENCOUNTER — Encounter: Payer: Self-pay | Admitting: Nurse Practitioner

## 2013-11-20 DIAGNOSIS — Z7901 Long term (current) use of anticoagulants: Secondary | ICD-10-CM | POA: Insufficient documentation

## 2013-11-20 DIAGNOSIS — E892 Postprocedural hypoparathyroidism: Secondary | ICD-10-CM | POA: Insufficient documentation

## 2013-11-20 DIAGNOSIS — Z9009 Acquired absence of other part of head and neck: Secondary | ICD-10-CM | POA: Insufficient documentation

## 2013-11-23 ENCOUNTER — Ambulatory Visit: Payer: Medicare Other | Admitting: Family Medicine

## 2013-11-25 ENCOUNTER — Ambulatory Visit (INDEPENDENT_AMBULATORY_CARE_PROVIDER_SITE_OTHER): Payer: Medicare Other | Admitting: *Deleted

## 2013-11-25 ENCOUNTER — Encounter: Payer: Self-pay | Admitting: Internal Medicine

## 2013-11-25 DIAGNOSIS — R55 Syncope and collapse: Secondary | ICD-10-CM

## 2013-11-25 LAB — MDC_IDC_ENUM_SESS_TYPE_REMOTE
Date Time Interrogation Session: 20150601113240
MDC IDC SET ZONE DETECTION INTERVAL: 2000 ms
MDC IDC SET ZONE DETECTION INTERVAL: 3000 ms
Zone Setting Detection Interval: 370 ms

## 2013-11-26 ENCOUNTER — Encounter: Payer: Self-pay | Admitting: *Deleted

## 2013-12-21 ENCOUNTER — Encounter: Payer: Self-pay | Admitting: Family Medicine

## 2013-12-21 ENCOUNTER — Ambulatory Visit (INDEPENDENT_AMBULATORY_CARE_PROVIDER_SITE_OTHER): Payer: Medicare Other | Admitting: Family Medicine

## 2013-12-21 VITALS — BP 156/79 | HR 87 | Temp 98.0°F | Ht 71.0 in | Wt 194.0 lb

## 2013-12-21 DIAGNOSIS — T6391XA Toxic effect of contact with unspecified venomous animal, accidental (unintentional), initial encounter: Secondary | ICD-10-CM

## 2013-12-21 DIAGNOSIS — T63461A Toxic effect of venom of wasps, accidental (unintentional), initial encounter: Secondary | ICD-10-CM | POA: Insufficient documentation

## 2013-12-21 MED ORDER — HYDROCODONE-ACETAMINOPHEN 5-325 MG PO TABS: 1.0000 | ORAL_TABLET | Freq: Four times a day (QID) | ORAL | Status: DC | PRN

## 2013-12-21 NOTE — Progress Notes (Signed)
Pre visit review using our clinic review tool, if applicable. No additional management support is needed unless otherwise documented below in the visit note. 

## 2013-12-21 NOTE — Patient Instructions (Addendum)
DIANE: Give pt Dr. Thedore Mins Smith's office number so he can call and make appt to further evaluate his trigger fingers.   Take over the counter, generic allegra (fexodenadine) 180mg , 1 tab once a day until these stings don't bother you anymore. Take a pain pill (hydrocodone) if needed for severe pain.  Watch for sedation/impairment from the pain pills.  Apply aloe vera lotion/cream to the stings as needed.

## 2013-12-21 NOTE — Progress Notes (Signed)
OFFICE NOTE  12/21/2013  CC: Yellow jacket stings HPI: Patient is a 68 y.o. Caucasian male who is here for multiple yellow jacket stings about 1 and 1/2 hours ago. Sustained about 8-10 stings --one on face, a few on each arm, a few on each lower leg. No SOB, no wheeze, no tongue or lips swelling, no throat itching or swelling.   Peroxide no help, topical alcohol application no help.  The areas hurt/burn but he is not itching. No hx of angioedema or anaphylaxis with this type of sting in the past.  Pertinent PMH:  Past Medical History  Diagnosis Date  . Coronary artery disease     Prior myocardial infarction noted on Myoview 2010  . H/O paroxysmal supraventricular tachycardia     with wide complex  . Chronic kidney disease 09/10/1996    transplant done  . Hyperlipidemia   . Sinus bradycardia   . Gout     H/O  . COPD (chronic obstructive pulmonary disease)   . Hypertension   . Arthritis   . Carotid stenosis   . Trifascicular block     First degree AV block, right bundle branch block left anterior fascicular block  . Glaucoma   . Allergy   . Blood transfusion without reported diagnosis   . Thyroid disease   . Heart disease    PSH reviewed.  MEDS:  Outpatient Prescriptions Prior to Visit  Medication Sig Dispense Refill  . allopurinol (ZYLOPRIM) 100 MG tablet Take 100-200 mg by mouth 2 (two) times daily. 1 tablet in the morning 2 tablets at night      . calcitRIOL (ROCALTROL) 0.25 MCG capsule Take 0.25 mcg by mouth daily.      . calcium carbonate (OS-CAL) 600 MG TABS Take 500 mg by mouth daily.       . furosemide (LASIX) 40 MG tablet Take 20 mg by mouth daily.       Marland Kitchen lisinopril (PRINIVIL,ZESTRIL) 5 MG tablet Take 5 mg by mouth daily.      . Multiple Vitamins-Minerals (OCUVITE PO) Take by mouth daily.        . mycophenolate (CELLCEPT) 500 MG tablet Take 1,000 mg by mouth 2 (two) times daily.       . Naproxen Sodium (ALEVE PO) Take 1 tablet by mouth daily as needed (pain).       Marland Kitchen warfarin (COUMADIN) 5 MG tablet Take 1.5-2 tablets (7.5-10 mg total) by mouth daily. 10mg -Saturday and Sunday 7.5mg -All other days  46 tablet  11  . atorvastatin (LIPITOR) 80 MG tablet Take 40 mg by mouth daily.       Marland Kitchen guaifenesin (HUMIBID E) 400 MG TABS tablet Take 1t po q8h for 5 days then q8h PRN chest congestion  56 tablet  0  . varenicline (CHANTIX) 1 MG tablet Take 1 mg by mouth daily.       No facility-administered medications prior to visit.    PE: Blood pressure 156/79, pulse 87, temperature 98 F (36.7 C), temperature source Oral, height 5\' 11"  (1.803 m), weight 194 lb (87.998 kg), SpO2 98.00%. Gen: Alert, well appearing.  Patient is oriented to person, place, time, and situation. ENT: Ears: EACs clear, normal epithelium.  TMs with good light reflex and landmarks bilaterally.  Eyes: no injection, icteris, swelling, or exudate.  EOMI, PERRLA. Nose: no drainage or turbinate edema/swelling.  No injection or focal lesion.  Mouth: lips without lesion/swelling.  Oral mucosa pink and moist.  Dentition intact and without obvious caries or  gingival swelling.  Oropharynx without erythema, exudate, or swelling.  CV: RRR, mechanical S2, no murmur, rub or gallop Chest is clear, no wheezing or rales. Normal symmetric air entry throughout both lung fields. No chest wall deformities or tenderness. SKIN: approx 10-12 small erythematous splotches on lower legs, forearms/wrists, and one on left lower facial region.  Minimal swelling, mild tenderness to palpation of these areas.  No streaking.  NO nodules or vesicles or hives.  IMPRESSION AND PLAN:  Multiple yellow jacket stings:  Allegra 180mg  qd prn. Aloe Vera to each sting prn. Vicodin 5/325, 1-2 q6h prn severe pain. Signs/symptoms to call or return for were reviewed and pt expressed understanding.  An After Visit Summary was printed and given to the patient.  FOLLOW UP: prn

## 2014-01-04 ENCOUNTER — Encounter: Payer: Self-pay | Admitting: Internal Medicine

## 2014-01-08 ENCOUNTER — Telehealth: Payer: Self-pay | Admitting: Cardiology

## 2014-01-08 ENCOUNTER — Encounter: Payer: Self-pay | Admitting: Cardiology

## 2014-01-08 NOTE — Telephone Encounter (Signed)
LMOVM for pt to send manual transmission for loop recorder. Home monitor has became deactivated and in order to reactivate monitor pt needs to send manual transmission.

## 2014-01-11 ENCOUNTER — Other Ambulatory Visit (INDEPENDENT_AMBULATORY_CARE_PROVIDER_SITE_OTHER): Payer: Medicare Other

## 2014-01-11 ENCOUNTER — Encounter: Payer: Self-pay | Admitting: Family Medicine

## 2014-01-11 ENCOUNTER — Ambulatory Visit (INDEPENDENT_AMBULATORY_CARE_PROVIDER_SITE_OTHER): Payer: Medicare Other | Admitting: Family Medicine

## 2014-01-11 VITALS — BP 130/78 | HR 85 | Ht 70.5 in | Wt 195.0 lb

## 2014-01-11 DIAGNOSIS — M79644 Pain in right finger(s): Secondary | ICD-10-CM

## 2014-01-11 DIAGNOSIS — M6530 Trigger finger, unspecified finger: Secondary | ICD-10-CM

## 2014-01-11 DIAGNOSIS — M79609 Pain in unspecified limb: Secondary | ICD-10-CM

## 2014-01-11 NOTE — Progress Notes (Signed)
[facility] [address] [place], [place] [address] Phone: [phone] Subjective:    CC: trigger finger.   [id number] John Barton is a 68 y.o. male coming in with complaint of trigger finger. Patient was seen previously for a tear of the deltoid muscle as well as the pectoralis major tendinitis. Patient also had chronic rotator cuff arthropathy. Patient states that this seems to be doing better. Patient states he is having times where his middle fingers on both hands did start. And does cause some mild discomfort and pain as well. If that occurs 2-3 times daily and is increasing frequency. Patient states that if he tries to grab something he can locked and he has to manually extend his finger. Denies any numbness denies any weakness. Rates the severity is 7/10 because it is affecting his daily activities. Right is greater than left.     Past medical history, social, surgical and family history all reviewed in electronic medical record.   Review of Systems: No headache, visual changes, nausea, vomiting, diarrhea, constipation, dizziness, abdominal pain, skin rash, fevers, chills, night sweats, weight loss, swollen lymph nodes, body aches, joint swelling, muscle aches, chest pain, shortness of breath, mood changes.   Objective Blood pressure 130/78, pulse 85, height 5' 10.5" (1.791 m), weight 195 lb (88.451 kg), SpO2 95.00%.  General: No apparent distress alert and oriented x3 mood and affect normal, dressed appropriately.  HEENT: Pupils equal, extraocular movements intact  Respiratory: Patient's speak in full sentences and does not appear short of breath  Cardiovascular: No lower extremity edema, non tender, no erythema  Skin: Warm dry intact with no signs of infection or rash on extremities or on axial skeleton.  Abdomen: Soft nontender  Neuro: Cranial nerves II through XII are intact, neurovascularly intact in all extremities with 2+ DTRs and 2+  pulses.  Lymph: No lymphadenopathy of posterior or anterior cervical chain or axillae bilaterally.  Gait normal with good balance and coordination.  MSK:  Non tender with full range of motion and good stability and symmetric strength and tone of  elbows, wrist, hip, knee and ankles bilaterally.  Shoulder: Left  Inspection reveals patient is presenting is considerable better than previous exam.  Palpation is normal with no tenderness over AC joint or bicipital groove.  Actively patient only has forward flexion to 85 with a positive drop arm. Patient does have full internal and external range of motion with mild crepitus.  Rotator cuff 3/5 strength compared to 4-5 on the contralateral side the  No signs of impingement with negative Neer and Hawkin's tests, empty can sign. Pain with resisted adduction of the arm  Speeds and Yergason's tests normal.  Normal scapular function observed.  Positive drop arm.  Contralateral shoulder unremarkable except for some mild weakness. Hand exam shows patient has had triggering with nodules palpated at the A2 pulley bilateral middle fingers. No angulation of the fingers. Mild osteophytic changes of the DIP joint and PIP joints of multiple fingers. Good grip strength and neurovascular intact distally.  Limited musculoskeletal ultrasound was performed and interpreted by Hulan Saas, M Limited ultrasound of the hand shows patient does have nodules present within the tendon sheath at the A2 pulley of the middle fingers bilaterally left is larger than the right.  After verbal consent after discussing potential side effects of bleeding, infection, or tendon rupture patient chose to have a injection. Patient was prepped with alcohol swabs and with a 25-gauge 1 inch needle was  injected with 0.5 cc of 0.5% Marcaine and 0.5 cc of Kenalog 40 mg/dL into the tendon sheath under ultrasound guidance. Patient tolerated the procedure well. No pain. No blood loss.  Successful  intratendinous injection under ultrasound.   Impression and Recommendations:     This case required medical decision making of moderate complexity.

## 2014-01-11 NOTE — Patient Instructions (Signed)
Good to see you Injected your trigger finger.  Ice 20 minutes 2 times daily.  Wear the splint for next 48 hours.  Come back in 2-3 weeks. We will see how you are doing.

## 2014-01-11 NOTE — Assessment & Plan Note (Signed)
The patient was given injection and tolerated the procedure well. Patient was given a brace was fitted by me today to wear for the next 48 hours. Patient was given home exercises for range of motion exercising. Patient will return in 2-3 weeks for further evaluation. This patient is doing well we'll consider an injection into his left middle finger.

## 2014-01-14 ENCOUNTER — Telehealth: Payer: Self-pay | Admitting: Internal Medicine

## 2014-01-14 NOTE — Telephone Encounter (Signed)
New message ° ° ° ° °Did we get his remote check? °

## 2014-01-14 NOTE — Telephone Encounter (Signed)
Receiving transmissions--spoke w/pt.

## 2014-01-25 ENCOUNTER — Ambulatory Visit (INDEPENDENT_AMBULATORY_CARE_PROVIDER_SITE_OTHER): Payer: Medicare Other | Admitting: *Deleted

## 2014-01-25 DIAGNOSIS — R55 Syncope and collapse: Secondary | ICD-10-CM

## 2014-01-26 LAB — MDC_IDC_ENUM_SESS_TYPE_REMOTE
MDC IDC SESS DTM: 20150809023236
MDC IDC SET ZONE DETECTION INTERVAL: 3000 ms
Zone Setting Detection Interval: 2000 ms
Zone Setting Detection Interval: 370 ms

## 2014-01-28 NOTE — Progress Notes (Signed)
Loop recorder 

## 2014-02-24 ENCOUNTER — Ambulatory Visit (INDEPENDENT_AMBULATORY_CARE_PROVIDER_SITE_OTHER): Payer: Medicare Other | Admitting: *Deleted

## 2014-02-24 DIAGNOSIS — R55 Syncope and collapse: Secondary | ICD-10-CM

## 2014-02-26 NOTE — Progress Notes (Signed)
Loop recorder 

## 2014-03-08 LAB — MDC_IDC_ENUM_SESS_TYPE_REMOTE
Date Time Interrogation Session: 20150908120059
MDC IDC SET ZONE DETECTION INTERVAL: 2000 ms
MDC IDC SET ZONE DETECTION INTERVAL: 3000 ms
Zone Setting Detection Interval: 370 ms

## 2014-03-26 ENCOUNTER — Ambulatory Visit (INDEPENDENT_AMBULATORY_CARE_PROVIDER_SITE_OTHER): Payer: Medicare Other | Admitting: *Deleted

## 2014-03-26 DIAGNOSIS — R55 Syncope and collapse: Secondary | ICD-10-CM

## 2014-03-31 NOTE — Progress Notes (Signed)
Loop recorder 

## 2014-04-02 ENCOUNTER — Encounter: Payer: Self-pay | Admitting: Internal Medicine

## 2014-04-02 LAB — MDC_IDC_ENUM_SESS_TYPE_REMOTE
Date Time Interrogation Session: 20151012110403
MDC IDC SET ZONE DETECTION INTERVAL: 370 ms
Zone Setting Detection Interval: 2000 ms
Zone Setting Detection Interval: 3000 ms

## 2014-04-26 ENCOUNTER — Ambulatory Visit (INDEPENDENT_AMBULATORY_CARE_PROVIDER_SITE_OTHER): Payer: Medicare Other | Admitting: *Deleted

## 2014-04-26 DIAGNOSIS — R55 Syncope and collapse: Secondary | ICD-10-CM

## 2014-04-26 LAB — MDC_IDC_ENUM_SESS_TYPE_REMOTE

## 2014-04-27 ENCOUNTER — Encounter: Payer: Self-pay | Admitting: Internal Medicine

## 2014-04-27 NOTE — Progress Notes (Signed)
Loop recorder 

## 2014-05-21 ENCOUNTER — Encounter: Payer: Self-pay | Admitting: Internal Medicine

## 2014-05-26 ENCOUNTER — Ambulatory Visit (INDEPENDENT_AMBULATORY_CARE_PROVIDER_SITE_OTHER): Payer: Medicare Other | Admitting: *Deleted

## 2014-05-26 DIAGNOSIS — R55 Syncope and collapse: Secondary | ICD-10-CM

## 2014-05-26 LAB — MDC_IDC_ENUM_SESS_TYPE_REMOTE
MDC IDC SESS DTM: 20151222140637
MDC IDC SET ZONE DETECTION INTERVAL: 2000 ms
Zone Setting Detection Interval: 3000 ms
Zone Setting Detection Interval: 370 ms

## 2014-05-27 ENCOUNTER — Encounter (HOSPITAL_COMMUNITY): Payer: Self-pay | Admitting: Internal Medicine

## 2014-05-28 NOTE — Progress Notes (Signed)
Loop recorder 

## 2014-06-09 ENCOUNTER — Telehealth (HOSPITAL_COMMUNITY): Payer: Self-pay | Admitting: *Deleted

## 2014-06-09 NOTE — Telephone Encounter (Signed)
Spoke with pt today to verify & confirm date he last had colonoscopy. Pt stated, " haven't had one and will never have one". Suggested to discuss other options for colorectal Ca screening with PCP on next visit.

## 2014-06-15 ENCOUNTER — Encounter: Payer: Self-pay | Admitting: Internal Medicine

## 2014-06-21 ENCOUNTER — Ambulatory Visit: Payer: Self-pay | Admitting: *Deleted

## 2014-06-24 DIAGNOSIS — I359 Nonrheumatic aortic valve disorder, unspecified: Secondary | ICD-10-CM | POA: Diagnosis not present

## 2014-06-29 ENCOUNTER — Encounter: Payer: Self-pay | Admitting: Family Medicine

## 2014-06-29 ENCOUNTER — Ambulatory Visit (HOSPITAL_BASED_OUTPATIENT_CLINIC_OR_DEPARTMENT_OTHER)
Admission: RE | Admit: 2014-06-29 | Discharge: 2014-06-29 | Disposition: A | Discharge status: Home / Self Care | Payer: Medicare Other | Source: Ambulatory Visit | Attending: Family Medicine | Admitting: Family Medicine

## 2014-06-29 ENCOUNTER — Ambulatory Visit (INDEPENDENT_AMBULATORY_CARE_PROVIDER_SITE_OTHER): Payer: Medicare Other | Admitting: Family Medicine

## 2014-06-29 VITALS — BP 112/73 | HR 72 | Temp 99.0°F | Resp 18 | Ht 71.0 in | Wt 197.0 lb

## 2014-06-29 DIAGNOSIS — R058 Other specified cough: Secondary | ICD-10-CM

## 2014-06-29 DIAGNOSIS — J387 Other diseases of larynx: Secondary | ICD-10-CM

## 2014-06-29 DIAGNOSIS — J3089 Other allergic rhinitis: Secondary | ICD-10-CM | POA: Diagnosis not present

## 2014-06-29 DIAGNOSIS — R0989 Other specified symptoms and signs involving the circulatory and respiratory systems: Secondary | ICD-10-CM | POA: Diagnosis not present

## 2014-06-29 DIAGNOSIS — K219 Gastro-esophageal reflux disease without esophagitis: Secondary | ICD-10-CM | POA: Diagnosis not present

## 2014-06-29 DIAGNOSIS — R05 Cough: Secondary | ICD-10-CM | POA: Insufficient documentation

## 2014-06-29 DIAGNOSIS — J0100 Acute maxillary sinusitis, unspecified: Secondary | ICD-10-CM | POA: Diagnosis not present

## 2014-06-29 MED ORDER — PANTOPRAZOLE SODIUM 40 MG PO TBEC: 40.0000 mg | DELAYED_RELEASE_TABLET | Freq: Every day | ORAL | Status: DC

## 2014-06-29 MED ORDER — FLUTICASONE PROPIONATE 50 MCG/ACT NA SUSP: 2.0000 | Freq: Every day | NASAL | Status: DC

## 2014-06-29 MED ORDER — FEXOFENADINE HCL 60 MG PO TABS: 60.0000 mg | ORAL_TABLET | Freq: Two times a day (BID) | ORAL | Status: DC

## 2014-06-29 MED ORDER — METHYLPREDNISOLONE ACETATE 80 MG/ML IJ SUSP
80.0000 mg | Freq: Once | INTRAMUSCULAR | Status: AC
  Administered 2014-06-29: 80 mg via INTRAMUSCULAR

## 2014-06-29 NOTE — Progress Notes (Signed)
OFFICE NOTE  06/29/2014  CC:  Chief Complaint  Patient presents with  . URI    x December 1   HPI: Patient is a 69 y.o. Caucasian male who is here for nasal mucous, sneezing, PND, making throat raw, some night time gagging/coughing up the mucous. Going on for at least 5 weeks now.  Took a course of amoxil from Dr. Jimmy Footman at the beginning of the illness.  No facial pain or upper teeth pain.  Mild forehead headache this morning but this is not a regular thing.  Mucinex DM, OTC allergy med a bit helpful. No fevers.  Clears throat "all day long" due to feeling something in throat.  Takes no med for GERD.  Denies "typical" sx's of GER. No exposure to wood burning stove or kerosene heater.    Pertinent PMH:  PMH and PSH reviewed.  MEDS:  Outpatient Prescriptions Prior to Visit  Medication Sig Dispense Refill  . allopurinol (ZYLOPRIM) 100 MG tablet Take 100-200 mg by mouth 2 (two) times daily. 1 tablet in the morning 2 tablets at night    . atorvastatin (LIPITOR) 40 MG tablet Take 40 mg by mouth daily.    . calcitRIOL (ROCALTROL) 0.25 MCG capsule Take 0.25 mcg by mouth daily.    . calcium carbonate (OS-CAL) 600 MG TABS Take 500 mg by mouth daily.     . furosemide (LASIX) 40 MG tablet Take 20 mg by mouth daily.     Marland Kitchen lisinopril (PRINIVIL,ZESTRIL) 5 MG tablet Take 5 mg by mouth daily.    . Multiple Vitamins-Minerals (OCUVITE PO) Take by mouth daily.      . mycophenolate (CELLCEPT) 500 MG tablet Take 1,000 mg by mouth 2 (two) times daily.     Marland Kitchen warfarin (COUMADIN) 5 MG tablet Take 1.5-2 tablets (7.5-10 mg total) by mouth daily. 10mg -Saturday and Sunday 7.5mg -All other days 46 tablet 11  . HYDROcodone-acetaminophen (NORCO/VICODIN) 5-325 MG per tablet Take 1 tablet by mouth every 6 (six) hours as needed for moderate pain. (Patient not taking: Reported on 06/29/2014) 20 tablet 0  . Naproxen Sodium (ALEVE PO) Take 1 tablet by mouth daily as needed (pain).     No facility-administered  medications prior to visit.    PE: Blood pressure 112/73, pulse 72, temperature 99 F (37.2 C), temperature source Temporal, resp. rate 18, height 5\' 11"  (1.803 m), weight 197 lb (89.359 kg), SpO2 95 %. Gen: Alert, well appearing.  Patient is oriented to person, place, time, and situation. ENT: Ears: EACs: right with 100% cerumen impaction, left pretty clear, normal epithelium.  Left TM with good light reflex and landmarks.  Right TM not visualized due to cerumen impaction.  Eyes: no injection, icteris, swelling, or exudate.  EOMI, PERRLA. Nose: no drainage or turbinate edema/swelling.  No injection or focal lesion.  Mouth: lips without lesion/swelling.  Oral mucosa pink and moist.  Dentition intact and without obvious caries or gingival swelling.  Oropharynx without erythema, exudate, or swelling.  Throat and soft palate with mild diffuse erythema but no swelling or exudate. Neck: mild ant cerv LAD that is mildly tender to palpation. CV: RRR, no m/r/g LUNGS: CTA bilat, nonlabored resps .  IMPRESSION AND PLAN:  Upper airway cough syndrome, most prominent is his allergic rhinitis w/PND.  Infectious component doubtful but plan to get plain films of sinuses to look further into this. 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 PLAN: Depo medrol 80mg  IM in office. Flonase qd, allegra 60mg  bid, pantoprazole 40 mg qd.  An  After Visit Summary was printed and given to the patient.  FOLLOW UP: 1 mo

## 2014-06-29 NOTE — Progress Notes (Signed)
Pre visit review using our clinic review tool, if applicable. No additional management support is needed unless otherwise documented below in the visit note. 

## 2014-07-10 LAB — MDC_IDC_ENUM_SESS_TYPE_REMOTE

## 2014-07-14 DIAGNOSIS — I359 Nonrheumatic aortic valve disorder, unspecified: Secondary | ICD-10-CM | POA: Diagnosis not present

## 2014-07-15 ENCOUNTER — Encounter: Payer: Self-pay | Admitting: Internal Medicine

## 2014-07-21 DIAGNOSIS — I359 Nonrheumatic aortic valve disorder, unspecified: Secondary | ICD-10-CM | POA: Diagnosis not present

## 2014-07-26 ENCOUNTER — Ambulatory Visit (INDEPENDENT_AMBULATORY_CARE_PROVIDER_SITE_OTHER): Payer: Medicare Other | Admitting: *Deleted

## 2014-07-26 DIAGNOSIS — R55 Syncope and collapse: Secondary | ICD-10-CM

## 2014-07-26 NOTE — Progress Notes (Signed)
Loop recorder 

## 2014-08-03 ENCOUNTER — Ambulatory Visit: Payer: Medicare Other | Admitting: Family Medicine

## 2014-08-03 LAB — MDC_IDC_ENUM_SESS_TYPE_REMOTE
Date Time Interrogation Session: 20160131215318
MDC IDC SET ZONE DETECTION INTERVAL: 2000 ms
MDC IDC SET ZONE DETECTION INTERVAL: 370 ms
Zone Setting Detection Interval: 3000 ms

## 2014-08-05 ENCOUNTER — Ambulatory Visit: Payer: Medicare Other | Admitting: Family Medicine

## 2014-08-10 DIAGNOSIS — H3551 Vitreoretinal dystrophy: Secondary | ICD-10-CM | POA: Diagnosis not present

## 2014-08-11 ENCOUNTER — Encounter: Payer: Self-pay | Admitting: Family Medicine

## 2014-08-11 ENCOUNTER — Ambulatory Visit (INDEPENDENT_AMBULATORY_CARE_PROVIDER_SITE_OTHER): Payer: Medicare Other | Admitting: Family Medicine

## 2014-08-11 VITALS — BP 108/69 | HR 85 | Temp 98.6°F | Resp 18 | Ht 71.0 in | Wt 199.0 lb

## 2014-08-11 DIAGNOSIS — Z23 Encounter for immunization: Secondary | ICD-10-CM

## 2014-08-11 DIAGNOSIS — Z0000 Encounter for general adult medical examination without abnormal findings: Secondary | ICD-10-CM | POA: Diagnosis not present

## 2014-08-11 DIAGNOSIS — R05 Cough: Secondary | ICD-10-CM | POA: Insufficient documentation

## 2014-08-11 DIAGNOSIS — R058 Other specified cough: Secondary | ICD-10-CM

## 2014-08-11 NOTE — Patient Instructions (Signed)
OK to remain off of flonase, allegra, and pantoprazole but if your symptoms of nasal mucous with postnasal drip, raw throat, and some gagging on your throat secretions returns for more than a couple of days then restart all 3 meds.

## 2014-08-11 NOTE — Progress Notes (Signed)
OFFICE NOTE  08/11/2014  CC:  Chief Complaint  Patient presents with  . Follow-up   HPI: Patient is a 69 y.o. Caucasian male who is here for upper airway cough syndrome.  Says he feels 100% better and has stopped the flonase, pantoprazole, and allegra that I rx'd last visit. He denies any acute complaints today.  Pertinent PMH:  Past medical, surgical, social, and family history reviewed and no changes are noted since last office visit.  MEDS:  Outpatient Prescriptions Prior to Visit  Medication Sig Dispense Refill  . allopurinol (ZYLOPRIM) 100 MG tablet Take 100-200 mg by mouth 2 (two) times daily. 1 tablet in the morning 2 tablets at night    . atorvastatin (LIPITOR) 40 MG tablet Take 40 mg by mouth daily.    . calcitRIOL (ROCALTROL) 0.25 MCG capsule Take 0.25 mcg by mouth daily.    . calcium carbonate (OS-CAL) 600 MG TABS Take 500 mg by mouth daily.     . fexofenadine (ALLEGRA) 60 MG tablet Take 1 tablet (60 mg total) by mouth 2 (two) times daily. 60 tablet 6  . fluticasone (FLONASE) 50 MCG/ACT nasal spray Place 2 sprays into both nostrils daily. 16 g 6  . furosemide (LASIX) 40 MG tablet Take 20 mg by mouth daily.     Marland Kitchen HYDROcodone-acetaminophen (NORCO/VICODIN) 5-325 MG per tablet Take 1 tablet by mouth every 6 (six) hours as needed for moderate pain. 20 tablet 0  . lisinopril (PRINIVIL,ZESTRIL) 5 MG tablet Take 5 mg by mouth daily.    . Multiple Vitamins-Minerals (OCUVITE PO) Take by mouth daily.      . mycophenolate (CELLCEPT) 500 MG tablet Take 1,000 mg by mouth 2 (two) times daily.     . Naproxen Sodium (ALEVE PO) Take 1 tablet by mouth daily as needed (pain).    . pantoprazole (PROTONIX) 40 MG tablet Take 1 tablet (40 mg total) by mouth daily. 30 tablet 60  . warfarin (COUMADIN) 5 MG tablet Take 1.5-2 tablets (7.5-10 mg total) by mouth daily. 10mg -Saturday and Sunday 7.5mg -All other days 46 tablet 11   No facility-administered medications prior to visit.    PE: Blood  pressure 108/69, pulse 85, temperature 98.6 F (37 C), temperature source Temporal, resp. rate 18, height 5\' 11"  (1.803 m), weight 199 lb (90.266 kg), SpO2 93 %. Gen: Alert, well appearing.  Patient is oriented to person, place, time, and situation. ENT: Ears: EACs clear, normal epithelium.  TMs with good light reflex and landmarks bilaterally.  Eyes: no injection, icteris, swelling, or exudate.  EOMI, PERRLA. Nose: no drainage or turbinate edema/swelling.  No injection or focal lesion.  Mouth: lips without lesion/swelling.  Oral mucosa pink and moist.  Dentition intact and without obvious caries or gingival swelling.  Oropharynx without erythema, exudate, or swelling.  Neck - No masses or thyromegaly or limitation in range of motion CV: RRR, mechanical S2, no signif murmur, no rub or gallop. LUNGS: cTA bilat, nonlabored resps  IMPRESSION AND PLAN:  1) Upper airway cough syndrome: resolved/quiescent currently. Instructions: OK to remain off of flonase, allegra, and pantoprazole but if your symptoms of nasal mucous with postnasal drip, raw throat, and some gagging on your throat secretions returns for more than a couple of days then restart all 3 meds.  2) Prevent health care: Prevnar 13 IM today. He declined zostavax.  An After Visit Summary was printed and given to the patient.  FOLLOW UP: 6 mo

## 2014-08-11 NOTE — Progress Notes (Signed)
Pre visit review using our clinic review tool, if applicable. No additional management support is needed unless otherwise documented below in the visit note. 

## 2014-08-11 NOTE — Addendum Note (Signed)
Addended by: Ralph Dowdy on: 08/11/2014 02:46 PM   Modules accepted: Orders

## 2014-08-12 DIAGNOSIS — I359 Nonrheumatic aortic valve disorder, unspecified: Secondary | ICD-10-CM | POA: Diagnosis not present

## 2014-08-23 DIAGNOSIS — H3531 Nonexudative age-related macular degeneration: Secondary | ICD-10-CM | POA: Diagnosis not present

## 2014-08-24 ENCOUNTER — Ambulatory Visit (INDEPENDENT_AMBULATORY_CARE_PROVIDER_SITE_OTHER): Payer: Medicare Other | Admitting: *Deleted

## 2014-08-24 ENCOUNTER — Ambulatory Visit: Payer: Medicare Other

## 2014-08-24 DIAGNOSIS — M1A30X0 Chronic gout due to renal impairment, unspecified site, without tophus (tophi): Secondary | ICD-10-CM | POA: Diagnosis not present

## 2014-08-24 DIAGNOSIS — E785 Hyperlipidemia, unspecified: Secondary | ICD-10-CM | POA: Diagnosis not present

## 2014-08-24 DIAGNOSIS — N183 Chronic kidney disease, stage 3 (moderate): Secondary | ICD-10-CM | POA: Diagnosis not present

## 2014-08-24 DIAGNOSIS — N2581 Secondary hyperparathyroidism of renal origin: Secondary | ICD-10-CM | POA: Diagnosis not present

## 2014-08-24 DIAGNOSIS — Z940 Kidney transplant status: Secondary | ICD-10-CM | POA: Diagnosis not present

## 2014-08-24 DIAGNOSIS — Z7901 Long term (current) use of anticoagulants: Secondary | ICD-10-CM | POA: Diagnosis not present

## 2014-08-24 DIAGNOSIS — R55 Syncope and collapse: Secondary | ICD-10-CM | POA: Diagnosis not present

## 2014-08-24 DIAGNOSIS — I129 Hypertensive chronic kidney disease with stage 1 through stage 4 chronic kidney disease, or unspecified chronic kidney disease: Secondary | ICD-10-CM | POA: Diagnosis not present

## 2014-08-25 NOTE — Progress Notes (Signed)
Loop recorder 

## 2014-08-31 ENCOUNTER — Encounter: Payer: Self-pay | Admitting: Internal Medicine

## 2014-09-01 DIAGNOSIS — I359 Nonrheumatic aortic valve disorder, unspecified: Secondary | ICD-10-CM | POA: Diagnosis not present

## 2014-09-07 ENCOUNTER — Encounter: Payer: Self-pay | Admitting: Internal Medicine

## 2014-09-08 LAB — MDC_IDC_ENUM_SESS_TYPE_REMOTE

## 2014-09-15 DIAGNOSIS — Z7689 Persons encountering health services in other specified circumstances: Secondary | ICD-10-CM | POA: Diagnosis not present

## 2014-09-20 ENCOUNTER — Encounter: Payer: Self-pay | Admitting: Internal Medicine

## 2014-09-23 ENCOUNTER — Ambulatory Visit (INDEPENDENT_AMBULATORY_CARE_PROVIDER_SITE_OTHER): Payer: Medicare Other | Admitting: *Deleted

## 2014-09-23 DIAGNOSIS — R55 Syncope and collapse: Secondary | ICD-10-CM | POA: Diagnosis not present

## 2014-09-27 NOTE — Progress Notes (Signed)
Loop recorder 

## 2014-09-30 DIAGNOSIS — I359 Nonrheumatic aortic valve disorder, unspecified: Secondary | ICD-10-CM | POA: Diagnosis not present

## 2014-10-18 DIAGNOSIS — I359 Nonrheumatic aortic valve disorder, unspecified: Secondary | ICD-10-CM | POA: Diagnosis not present

## 2014-10-22 ENCOUNTER — Ambulatory Visit (INDEPENDENT_AMBULATORY_CARE_PROVIDER_SITE_OTHER): Payer: Medicare Other | Admitting: *Deleted

## 2014-10-22 DIAGNOSIS — R55 Syncope and collapse: Secondary | ICD-10-CM

## 2014-10-22 LAB — CUP PACEART REMOTE DEVICE CHECK
Date Time Interrogation Session: 20160410232519
MDC IDC SET ZONE DETECTION INTERVAL: 3000 ms
Zone Setting Detection Interval: 2000 ms
Zone Setting Detection Interval: 370 ms

## 2014-10-29 NOTE — Progress Notes (Signed)
Loop recorder 

## 2014-11-01 DIAGNOSIS — Z940 Kidney transplant status: Secondary | ICD-10-CM | POA: Diagnosis not present

## 2014-11-01 DIAGNOSIS — I359 Nonrheumatic aortic valve disorder, unspecified: Secondary | ICD-10-CM | POA: Diagnosis not present

## 2014-11-09 ENCOUNTER — Encounter: Payer: Self-pay | Admitting: Family Medicine

## 2014-11-09 ENCOUNTER — Ambulatory Visit (INDEPENDENT_AMBULATORY_CARE_PROVIDER_SITE_OTHER): Payer: Medicare Other | Admitting: Family Medicine

## 2014-11-09 ENCOUNTER — Telehealth: Payer: Self-pay | Admitting: Family Medicine

## 2014-11-09 VITALS — BP 130/75 | HR 57 | Temp 97.7°F | Resp 16 | Wt 191.0 lb

## 2014-11-09 DIAGNOSIS — Z952 Presence of prosthetic heart valve: Secondary | ICD-10-CM

## 2014-11-09 DIAGNOSIS — R05 Cough: Secondary | ICD-10-CM | POA: Diagnosis not present

## 2014-11-09 DIAGNOSIS — R058 Other specified cough: Secondary | ICD-10-CM

## 2014-11-09 MED ORDER — LEVOCETIRIZINE DIHYDROCHLORIDE 5 MG PO TABS: 5.0000 mg | ORAL_TABLET | Freq: Every evening | ORAL | Status: DC

## 2014-11-09 MED ORDER — FLUTICASONE PROPIONATE 50 MCG/ACT NA SUSP: 2.0000 | Freq: Every day | NASAL | Status: DC

## 2014-11-09 MED ORDER — DEXLANSOPRAZOLE 60 MG PO CPDR: 60.0000 mg | DELAYED_RELEASE_CAPSULE | Freq: Every day | ORAL | Status: DC

## 2014-11-09 NOTE — Telephone Encounter (Signed)
I placed a rx savings card for dexilant that pt can come pick up and use---gets him the med for 30$ per month.

## 2014-11-09 NOTE — Telephone Encounter (Signed)
Per pharmacist pt has Medicare part D coverage and can not use savings card.

## 2014-11-09 NOTE — Telephone Encounter (Signed)
Pt advised and voiced understanding.   

## 2014-11-09 NOTE — Progress Notes (Signed)
Pre visit review using our clinic review tool, if applicable. No additional management support is needed unless otherwise documented below in the visit note. 

## 2014-11-09 NOTE — Telephone Encounter (Signed)
Patient did not fill Rx for Dexlansoprazole because it was $130.

## 2014-11-09 NOTE — Telephone Encounter (Signed)
Tried for PA but would not go through since medication is on the pts plan list of covered drugs. Spoke to pharmacist and she stated that his plan did cover some but not all the cost for his [place].

## 2014-11-09 NOTE — Progress Notes (Signed)
OFFICE NOTE  11/09/2014  CC:  Chief Complaint  Patient presents with  . Cough    x 5 months, productive   HPI: Patient is a 69 y.o. Caucasian male who is here for cough. Lots of coughing again, spits up whitish/frothy mucous, sometimes has a bit of a coughing fit. No SOB, wheezing, Chest pains, or fevers.  Recent trial of a friend's albuterol helped minimally. No hemoptysis. No ST, no classic GERD sx's.    Pertinent PMH:  COPD-essentially asymptomatic Upper airway cough syndrome that responded well to daily pantoprazole, flonase, and allegra in the past. He is s/p kidney transplant, takes the usual anti-rejection meds  MEDS:  Outpatient Prescriptions Prior to Visit  Medication Sig Dispense Refill  . allopurinol (ZYLOPRIM) 100 MG tablet Take 100-200 mg by mouth 2 (two) times daily. 1 tablet in the morning 2 tablets at night    . atorvastatin (LIPITOR) 40 MG tablet Take 40 mg by mouth daily.    . calcitRIOL (ROCALTROL) 0.25 MCG capsule Take 0.25 mcg by mouth daily.    . calcium carbonate (OS-CAL) 600 MG TABS Take 500 mg by mouth daily.     . furosemide (LASIX) 40 MG tablet Take 20 mg by mouth daily.     Marland Kitchen lisinopril (PRINIVIL,ZESTRIL) 5 MG tablet Take 5 mg by mouth daily.    . Multiple Vitamins-Minerals (OCUVITE PO) Take by mouth daily.      . mycophenolate (CELLCEPT) 500 MG tablet Take 1,000 mg by mouth 2 (two) times daily.     . Naproxen Sodium (ALEVE PO) Take 1 tablet by mouth daily as needed (pain).    Marland Kitchen warfarin (COUMADIN) 5 MG tablet Take 1.5-2 tablets (7.5-10 mg total) by mouth daily. 10mg -Saturday and Sunday 7.5mg -All other days 46 tablet 11  . fexofenadine (ALLEGRA) 60 MG tablet Take 1 tablet (60 mg total) by mouth 2 (two) times daily. (Patient not taking: Reported on 11/09/2014) 60 tablet 6  . fluticasone (FLONASE) 50 MCG/ACT nasal spray Place 2 sprays into both nostrils daily. (Patient not taking: Reported on 11/09/2014) 16 g 6  . HYDROcodone-acetaminophen  (NORCO/VICODIN) 5-325 MG per tablet Take 1 tablet by mouth every 6 (six) hours as needed for moderate pain. (Patient not taking: Reported on 11/09/2014) 20 tablet 0  . pantoprazole (PROTONIX) 40 MG tablet Take 1 tablet (40 mg total) by mouth daily. (Patient not taking: Reported on 11/09/2014) 30 tablet 60   No facility-administered medications prior to visit.    PE: Blood pressure 130/75, pulse 57, temperature 97.7 F (36.5 C), temperature source Oral, resp. rate 16, weight 191 lb (86.637 kg), SpO2 95 %. Gen: Alert, well appearing.  Patient is oriented to person, place, time, and situation. ENT: Ears: EACs clear, normal epithelium.  TMs with good light reflex and landmarks bilaterally.  Eyes: no injection, icteris, swelling, or exudate.  EOMI, PERRLA. Nose: no drainage or turbinate edema/swelling.  No injection or focal lesion.  Mouth: lips without lesion/swelling.  Oral mucosa pink and moist.  Dentition intact and without obvious caries or gingival swelling.  Oropharynx without erythema, exudate, or swelling.  Neck - No masses or thyromegaly or limitation in range of motion CV: RRR, mechanical S1, no murmur/rub/gallop. Chest is clear, no wheezing or rales. Normal symmetric air entry throughout both lung fields. No chest wall deformities or tenderness. EXT: no clubbing, cyanosis, or edema.   IMPRESSION AND PLAN:  Upper airway cough syndrome: recurrence of sx's since he discontinued the meds that helped him with this. Will  restart daily flonase, start xyzal 5mg  qhs (insurance-preferred over the allegra he had been on in the past), and will start dexilant 60mg  qd (instead of protonix, which we think may have interacted with his coumadin to affect his INR last time he was on it).  Review of drug interaction profile of dexilant shows no mention of potential interaction with coumadin/vit K antagonists.  An After Visit Summary was printed and given to the patient.  FOLLOW UP: early July, before he  heads off to [place] to visit relatives for a few weeks

## 2014-11-09 NOTE — Telephone Encounter (Signed)
OK, pls call him and tell him I want him to take OTC generic zantac 150mg  tab: 1 tab every morning and one tab every evening INSTEAD of the dexilant we tried to get him.  Have him come in for a lab visit 1 week after he starts the zantac and we'll see if it is having any adverse interaction with his coumadin (we'll check a PT/INR).-thx

## 2014-11-10 ENCOUNTER — Encounter: Payer: Self-pay | Admitting: Internal Medicine

## 2014-11-10 ENCOUNTER — Ambulatory Visit (INDEPENDENT_AMBULATORY_CARE_PROVIDER_SITE_OTHER): Payer: Medicare Other | Admitting: Internal Medicine

## 2014-11-10 VITALS — BP 112/60 | HR 80 | Ht 70.0 in | Wt 191.4 lb

## 2014-11-10 DIAGNOSIS — Z4509 Encounter for adjustment and management of other cardiac device: Secondary | ICD-10-CM

## 2014-11-10 DIAGNOSIS — R55 Syncope and collapse: Secondary | ICD-10-CM | POA: Diagnosis not present

## 2014-11-10 LAB — CUP PACEART INCLINIC DEVICE CHECK
Date Time Interrogation Session: 20160525160348
MDC IDC SESS DTM: 20160525160535

## 2014-11-10 NOTE — Patient Instructions (Signed)
Medication Instructions:  Your physician recommends that you continue on your current medications as directed. Please refer to the Current Medication list given to you today.  Labwork: None ordered  Testing/Procedures: None ordered  Follow-Up: Your physician wants you to follow-up in: 1 year with Dr. Caryl Comes.  You will receive a reminder letter in the mail two months in advance. If you don't receive a letter, please call our office to schedule the follow-up appointment.   Thank you for choosing [facility]!!

## 2014-11-10 NOTE — Progress Notes (Signed)
Patient Care Team: Tammi Sou, MD as PCP - General (Family Medicine)   HPI  John Barton is a 69 y.o. male Seen in followup for loop recorder implantation.  He has a history of syncope with prior myocardial infarction and status post mechanical aortic valve replacement And underwent EP testing demonstrating only nonsustained ventricular tachycardia and nonsustained SVT  He has a history of a wide-complex tachycardia for which he was seen in consultation 2010. For reasons that are not particularly clear, he ended up on Quinaglute.a CBC with differential was obtained March 2014 which was normal   The patient denies chest pain, shortness of breath, nocturnal dyspnea, orthopnea or peripheral edema.  There have been no palpitations, lightheadedness or syncope.   Echo EF 2/14  55%  Past Medical History  Diagnosis Date  . Coronary artery disease     Prior myocardial infarction noted on Myoview 2010  . H/O paroxysmal supraventricular tachycardia     with wide complex  . Chronic kidney disease 09/10/1996    transplant done  . Hyperlipidemia   . Sinus bradycardia   . Gout     H/O  . COPD (chronic obstructive pulmonary disease)   . Hypertension   . Arthritis   . Carotid stenosis   . Trifascicular block     First degree AV block, right bundle branch block left anterior fascicular block  . Glaucoma   . Allergy   . Blood transfusion without reported diagnosis   . Thyroid disease   . Heart disease     Past Surgical History  Procedure Laterality Date  . Coronary artery bypass graft  1995    RCA andLAD  . Aortic valve replacement  1995    [facility]ude mechanical valve  . Cardiac catheterization  1995  . Parathyroidectomy    . 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 Electrophysiology study N/A 09/11/2012    Procedure: ELECTROPHYSIOLOGY STUDY;  Surgeon: Deboraha Sprang, MD;  Location: [facility] CATH LAB;  Service: Cardiovascular;  Laterality: N/A;    Current  Outpatient Prescriptions  Medication Sig Dispense Refill  . allopurinol (ZYLOPRIM) 100 MG tablet 1 tablet by mouth in the morning 2 tablets by mouth at night    . atorvastatin (LIPITOR) 40 MG tablet Take 40 mg by mouth daily.    . calcitRIOL (ROCALTROL) 0.5 MCG capsule Take 0.5 mcg by mouth daily.  3  . calcium carbonate (TUMS - DOSED IN MG ELEMENTAL CALCIUM) 500 MG chewable tablet Chew 1 tablet by mouth daily.    . fluticasone (FLONASE) 50 MCG/ACT nasal spray Place 2 sprays into both nostrils daily. 16 g 6  . furosemide (LASIX) 40 MG tablet Take 20 mg by mouth daily.     Marland Kitchen lisinopril (PRINIVIL,ZESTRIL) 5 MG tablet Take 5 mg by mouth daily.    . Multiple Vitamins-Minerals (OCUVITE PO) Take 1 tablet by mouth daily.     . mycophenolate (CELLCEPT) 500 MG tablet Take 1,000 mg by mouth 2 (two) times daily.     . Naproxen Sodium (ALEVE PO) Take 1 tablet by mouth daily as needed (pain).    Marland Kitchen warfarin (COUMADIN) 5 MG tablet Take 1.5-2 tablets (7.5-10 mg total) by mouth daily. 10mg -Saturday and Sunday 7.5mg -All other days 46 tablet 11   No current facility-administered medications for this visit.    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 We'll continue him on three-month  interrogation  No recurrent syncope Will see in year

## 2014-11-11 LAB — CUP PACEART REMOTE DEVICE CHECK: Date Time Interrogation Session: 20160511040500

## 2014-11-16 ENCOUNTER — Other Ambulatory Visit: Payer: Medicare Other

## 2014-11-16 DIAGNOSIS — I359 Nonrheumatic aortic valve disorder, unspecified: Secondary | ICD-10-CM | POA: Diagnosis not present

## 2014-11-16 LAB — PROTIME-INR

## 2014-11-18 ENCOUNTER — Telehealth: Payer: Self-pay | Admitting: Family Medicine

## 2014-11-18 NOTE — Telephone Encounter (Signed)
Pt advised and voiced understanding.   

## 2014-11-18 NOTE — Telephone Encounter (Signed)
Pls notify pt that his INR was 3.1 (which is perfect) on 11/16/14, just in case he was not notified by Dr. Deterding's office. Continue current coumadin dosing regimen.-thx

## 2014-11-22 ENCOUNTER — Ambulatory Visit (INDEPENDENT_AMBULATORY_CARE_PROVIDER_SITE_OTHER): Payer: Medicare Other | Admitting: *Deleted

## 2014-11-22 DIAGNOSIS — R55 Syncope and collapse: Secondary | ICD-10-CM

## 2014-11-23 DIAGNOSIS — H26491 Other secondary cataract, right eye: Secondary | ICD-10-CM | POA: Diagnosis not present

## 2014-11-23 DIAGNOSIS — H0289 Other specified disorders of eyelid: Secondary | ICD-10-CM | POA: Diagnosis not present

## 2014-11-23 DIAGNOSIS — H3531 Nonexudative age-related macular degeneration: Secondary | ICD-10-CM | POA: Diagnosis not present

## 2014-11-24 ENCOUNTER — Encounter: Payer: Self-pay | Admitting: Internal Medicine

## 2014-11-24 NOTE — Progress Notes (Signed)
Loop recorder 

## 2014-11-29 DIAGNOSIS — Z7901 Long term (current) use of anticoagulants: Secondary | ICD-10-CM | POA: Diagnosis not present

## 2014-11-29 DIAGNOSIS — I129 Hypertensive chronic kidney disease with stage 1 through stage 4 chronic kidney disease, or unspecified chronic kidney disease: Secondary | ICD-10-CM | POA: Diagnosis not present

## 2014-11-29 DIAGNOSIS — M1A30X0 Chronic gout due to renal impairment, unspecified site, without tophus (tophi): Secondary | ICD-10-CM | POA: Diagnosis not present

## 2014-11-29 DIAGNOSIS — N183 Chronic kidney disease, stage 3 (moderate): Secondary | ICD-10-CM | POA: Diagnosis not present

## 2014-11-29 DIAGNOSIS — N2581 Secondary hyperparathyroidism of renal origin: Secondary | ICD-10-CM | POA: Diagnosis not present

## 2014-11-29 DIAGNOSIS — Z940 Kidney transplant status: Secondary | ICD-10-CM | POA: Diagnosis not present

## 2014-12-02 LAB — CUP PACEART REMOTE DEVICE CHECK: MDC IDC SESS DTM: 20160616120843

## 2014-12-06 ENCOUNTER — Telehealth: Payer: Self-pay | Admitting: Internal Medicine

## 2014-12-06 NOTE — Telephone Encounter (Signed)
Discussed with device clinic -- informed patient ok to take home monitor with him on his trip. Advised ok for monitor to go through xray. Patient verbalized understanding and agreeable to plan.

## 2014-12-06 NOTE — Telephone Encounter (Signed)
Ne wMessage  Pt leaving 7/12 until 7/25 for [place], [place]. Pt wanted to speak w/ RN to see if he should bring home monitor (on his nightstand). Please call back and discuss.

## 2014-12-21 ENCOUNTER — Ambulatory Visit: Payer: Medicare Other | Admitting: Family Medicine

## 2014-12-22 ENCOUNTER — Ambulatory Visit (INDEPENDENT_AMBULATORY_CARE_PROVIDER_SITE_OTHER): Payer: Medicare Other | Admitting: *Deleted

## 2014-12-22 DIAGNOSIS — R55 Syncope and collapse: Secondary | ICD-10-CM

## 2014-12-22 LAB — CUP PACEART REMOTE DEVICE CHECK: Date Time Interrogation Session: 20160706172730

## 2014-12-22 NOTE — Progress Notes (Signed)
Loop recorder 

## 2015-01-04 ENCOUNTER — Encounter: Payer: Self-pay | Admitting: Internal Medicine

## 2015-01-10 ENCOUNTER — Encounter: Payer: Self-pay | Admitting: Internal Medicine

## 2015-01-11 DIAGNOSIS — Z940 Kidney transplant status: Secondary | ICD-10-CM | POA: Diagnosis not present

## 2015-01-11 DIAGNOSIS — Z952 Presence of prosthetic heart valve: Secondary | ICD-10-CM | POA: Diagnosis not present

## 2015-01-21 ENCOUNTER — Ambulatory Visit (INDEPENDENT_AMBULATORY_CARE_PROVIDER_SITE_OTHER): Payer: Medicare Other | Admitting: *Deleted

## 2015-01-21 ENCOUNTER — Encounter: Payer: Self-pay | Admitting: Internal Medicine

## 2015-01-21 DIAGNOSIS — R55 Syncope and collapse: Secondary | ICD-10-CM | POA: Diagnosis not present

## 2015-01-21 NOTE — Progress Notes (Signed)
Loop recorder 

## 2015-01-25 LAB — CUP PACEART REMOTE DEVICE CHECK
Date Time Interrogation Session: 20160725210150
MDC IDC SET ZONE DETECTION INTERVAL: 2000 ms
Zone Setting Detection Interval: 3000 ms
Zone Setting Detection Interval: 370 ms

## 2015-02-10 ENCOUNTER — Ambulatory Visit (INDEPENDENT_AMBULATORY_CARE_PROVIDER_SITE_OTHER): Payer: Medicare Other | Admitting: Family Medicine

## 2015-02-10 ENCOUNTER — Encounter: Payer: Self-pay | Admitting: Family Medicine

## 2015-02-10 VITALS — BP 135/77 | HR 62 | Temp 98.5°F | Resp 18 | Ht 71.0 in | Wt 194.0 lb

## 2015-02-10 DIAGNOSIS — E785 Hyperlipidemia, unspecified: Secondary | ICD-10-CM

## 2015-02-10 DIAGNOSIS — Z125 Encounter for screening for malignant neoplasm of prostate: Secondary | ICD-10-CM

## 2015-02-10 DIAGNOSIS — I10 Essential (primary) hypertension: Secondary | ICD-10-CM

## 2015-02-10 DIAGNOSIS — R05 Cough: Secondary | ICD-10-CM | POA: Diagnosis not present

## 2015-02-10 DIAGNOSIS — Z1211 Encounter for screening for malignant neoplasm of colon: Secondary | ICD-10-CM

## 2015-02-10 DIAGNOSIS — R058 Other specified cough: Secondary | ICD-10-CM

## 2015-02-10 NOTE — Progress Notes (Signed)
OFFICE VISIT  02/10/2015   CC:  Chief Complaint  Patient presents with  . Follow-up   HPI:    Patient is a 69 y.o. Caucasian male who presents for 6 mo f/u upper airway cough syndrome, hyperlipidemia, HTN. Feels great since quitting cigars 01/20/15! Has a bit of cough still (upper airway cough syndrome) but he says it is better since quitting cigars. No wheezing or SOB or chest pain.  He has been getting regular lab f/u through his renal MD, Dr. Jimmy Footman. Cardiology (Dr. Caryl Comes) followed him up 11/10/14: no changes.  Reviewed last nephrology note and labs from 11/2014.  Pt declines prostate ca screening at this time. He also declines a colonoscopy for colon cancer screening.  Says he would do colonoscopy, though, if signs of bleeding were found in GI tract, so he was agreeable to doing hemoccult ICT testing today.   Past Medical History  Diagnosis Date  . Coronary artery disease     CABG 1995.  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 H/O paroxysmal supraventricular tachycardia     with wide complex  . Chronic renal insufficiency, stage III (moderate)     transplant done 09/10/96 --followed by Dr. Jimmy Footman.  GFR about 60 ml/min as of 11/2014.  Marland Kitchen Hyperlipidemia   . Sinus bradycardia   . Gout     H/O  . COPD (chronic obstructive pulmonary disease)   . Hypertension   . Arthritis   . Carotid stenosis   . Trifascicular block     First degree AV block, right bundle branch block left anterior fascicular block  . Glaucoma   . Allergy   . Blood transfusion without reported diagnosis   . Secondary hyperparathyroidism     parathyroidectomy done  . Heart disease     Ischemic heart disease and mild depressed LV and RV function  . 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 Syncope     He is s/p loop recorder insertion; as of Dr. Olin Pia f/u 10/2014 he has had no further syncope and no significant arrhythmias (just sinus brady)--was told to f/u 1 yr.    Past Surgical  History  Procedure Laterality Date  . Coronary artery bypass graft  1995    RCA andLAD  . Aortic valve replacement  1995    ST.Jude mechanical valve  . Cardiac catheterization  1995  . Parathyroidectomy    . 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 Electrophysiology study N/A 09/11/2012    Procedure: ELECTROPHYSIOLOGY STUDY;  Surgeon: Deboraha Sprang, MD;  Location: [facility] CATH LAB;  Service: Cardiovascular;  Laterality: N/A;  . Transthoracic echocardiogram  08/15/12    Mild LVH and LV dilation, EF 50-55%, normal aortic valve gradients    Outpatient Prescriptions Prior to Visit  Medication Sig Dispense Refill  . allopurinol (ZYLOPRIM) 100 MG tablet 1 tablet by mouth in the morning 2 tablets by mouth at night    . atorvastatin (LIPITOR) 40 MG tablet Take 40 mg by mouth daily.    . calcitRIOL (ROCALTROL) 0.5 MCG capsule Take 0.5 mcg by mouth daily.  3  . fluticasone (FLONASE) 50 MCG/ACT nasal spray Place 2 sprays into both nostrils daily. 16 g 6  . furosemide (LASIX) 40 MG tablet Take 20 mg by mouth daily.     Marland Kitchen lisinopril (PRINIVIL,ZESTRIL) 5 MG tablet Take 5 mg by mouth daily.    . Multiple Vitamins-Minerals (OCUVITE PO) Take 1 tablet by  mouth daily.     . mycophenolate (CELLCEPT) 500 MG tablet Take 1,000 mg by mouth 2 (two) times daily.     . Naproxen Sodium (ALEVE PO) Take 1 tablet by mouth daily as needed (pain).    Marland Kitchen warfarin (COUMADIN) 5 MG tablet Take 1.5-2 tablets (7.5-10 mg total) by mouth daily. 10mg -Saturday and Sunday 7.5mg -All other days 46 tablet 11  . calcium carbonate (TUMS - DOSED IN MG ELEMENTAL CALCIUM) 500 MG chewable tablet Chew 1 tablet by mouth daily.     No facility-administered medications prior to visit.    No Known Allergies  ROS As per HPI  PE: Blood pressure 135/77, pulse 62, temperature 98.5 F (36.9 C), temperature source Temporal, resp. rate 18, height 5\' 11"  (1.803 m), weight 194 lb (87.998 kg), SpO2 94 %. Gen: Alert, well appearing.   Patient is oriented to person, place, time, and situation. [id number]: no injection, icteris, swelling, or exudate.  EOMI, PERRLA. Mouth: lips without lesion/swelling.  Oral mucosa pink and moist. Oropharynx without erythema, exudate, or swelling.  CV: RRR, S1 normal, mechanical S2, no murmur.  No rub or gallop. Chest is clear, no wheezing or rales. Normal symmetric air entry throughout both lung fields. No chest wall deformities or tenderness. EXT: no clubbing, cyanosis, or edema.    LABS:  Lab Results  Component Value Date   CHOL  12/05/2008    150        ATP III CLASSIFICATION:  <200     mg/dL   Desirable  200-239  mg/dL   Borderline High  >=240    mg/dL   High          HDL 28* 12/05/2008   LDLCALC  12/05/2008    87        Total Cholesterol/HDL:CHD Risk Coronary Heart Disease Risk Table                     Men   Women  1/2 Average Risk   3.4   3.3  Average Risk       5.0   4.4  2 X Average Risk   9.6   7.1  3 X Average Risk  23.4   11.0        Use the calculated Patient Ratio above and the CHD Risk Table to determine the patient's CHD Risk.        ATP III CLASSIFICATION (LDL):  <100     mg/dL   Optimal  100-129  mg/dL   Near or Above                    Optimal  130-159  mg/dL   Borderline  160-189  mg/dL   High  >190     mg/dL   Very High   TRIG 175* 12/05/2008   CHOLHDL 5.4 12/05/2008     Chemistry      Component Value Date/Time   NA 137 11/17/2013   NA 138 09/11/2012 0820   K 5.3 11/17/2013   K 4.4 09/11/2012 0820   CL 100 11/17/2013   CL 102 09/11/2012 0820   CO2 28 09/11/2012 0820   BUN 29* 11/17/2013   BUN 24* 09/11/2012 0820   CREATININE 1.34 11/17/2013   CREATININE 1.42* 09/11/2012 0820      Component Value Date/Time   CALCIUM 10.0 11/17/2013   CALCIUM 9.5 09/11/2012 0820   ALKPHOS 101 11/17/2013   ALKPHOS 83 12/11/2008 0600   AST 25 11/17/2013  AST 26 12/11/2008 0600   ALT 23 11/17/2013   BILITOT 0.7 12/11/2008 0600     Lab Results   Component Value Date   WBC 6.7 11/17/2013   HGB 13.6 08/29/2012   HCT 44 11/17/2013   MCV 88.8 08/29/2012   PLT 161.0 08/29/2012    Lab Results  Component Value Date   HGBA1C  12/04/2008    5.8 (NOTE) The ADA recommends the following therapeutic goal for glycemic control related to Hgb A1c measurement: Goal of therapy: <6.5 Hgb A1c  Reference: American Diabetes Association: Clinical Practice Recommendations 2010, Diabetes Care, 2010, 33: (Suppl  1).   IMPRESSION AND PLAN:  1) HTN; The current medical regimen is effective;  continue present plan and medications.  2) Hyperlipidemia: pt is never fasting when I see him.  Perhaps Dr. Jimmy Footman is going to check FLP with next labs this coming week. Tolerating statin, AST/ALT normal 11/2014.  3) Upper airway cough syndrome: much improved.  Continue daily xyzal and pantoprazole.   Great results from quitting cigar smoking!  4) Colon cancer screening: hemoccult ICT cards given today. If positive, needs colonoscopy/GI referral.  5) Prostate ca screening: pt declines this at this time.  An After Visit Summary was printed and given to the patient.  FOLLOW UP: Return in about 6 months (around 08/13/2015) for routine chronic illness f/u.

## 2015-02-10 NOTE — Progress Notes (Signed)
Pre visit review using our clinic review tool, if applicable. No additional management support is needed unless otherwise documented below in the visit note. 

## 2015-02-14 DIAGNOSIS — M1A30X0 Chronic gout due to renal impairment, unspecified site, without tophus (tophi): Secondary | ICD-10-CM | POA: Diagnosis not present

## 2015-02-14 DIAGNOSIS — Z952 Presence of prosthetic heart valve: Secondary | ICD-10-CM | POA: Diagnosis not present

## 2015-02-14 DIAGNOSIS — D751 Secondary polycythemia: Secondary | ICD-10-CM | POA: Diagnosis not present

## 2015-02-15 ENCOUNTER — Other Ambulatory Visit (INDEPENDENT_AMBULATORY_CARE_PROVIDER_SITE_OTHER): Payer: Medicare Other

## 2015-02-15 ENCOUNTER — Other Ambulatory Visit: Payer: Self-pay | Admitting: *Deleted

## 2015-02-15 DIAGNOSIS — Z1211 Encounter for screening for malignant neoplasm of colon: Secondary | ICD-10-CM

## 2015-02-15 LAB — FECAL OCCULT BLOOD, IMMUNOCHEMICAL: Fecal Occult Bld: NEGATIVE

## 2015-02-16 ENCOUNTER — Telehealth: Payer: Self-pay | Admitting: *Deleted

## 2015-02-16 NOTE — Telephone Encounter (Signed)
Patient returned call reviewed lab results (fecal occult blood) negative patient verbalized understanding.

## 2015-02-18 ENCOUNTER — Ambulatory Visit (INDEPENDENT_AMBULATORY_CARE_PROVIDER_SITE_OTHER): Payer: Medicare Other | Admitting: *Deleted

## 2015-02-18 DIAGNOSIS — R55 Syncope and collapse: Secondary | ICD-10-CM | POA: Diagnosis not present

## 2015-02-22 DIAGNOSIS — N2581 Secondary hyperparathyroidism of renal origin: Secondary | ICD-10-CM | POA: Diagnosis not present

## 2015-02-22 DIAGNOSIS — N183 Chronic kidney disease, stage 3 (moderate): Secondary | ICD-10-CM | POA: Diagnosis not present

## 2015-02-22 DIAGNOSIS — Z940 Kidney transplant status: Secondary | ICD-10-CM | POA: Diagnosis not present

## 2015-02-22 DIAGNOSIS — E785 Hyperlipidemia, unspecified: Secondary | ICD-10-CM | POA: Diagnosis not present

## 2015-02-22 DIAGNOSIS — I129 Hypertensive chronic kidney disease with stage 1 through stage 4 chronic kidney disease, or unspecified chronic kidney disease: Secondary | ICD-10-CM | POA: Diagnosis not present

## 2015-02-22 DIAGNOSIS — Z7901 Long term (current) use of anticoagulants: Secondary | ICD-10-CM | POA: Diagnosis not present

## 2015-02-23 NOTE — Progress Notes (Signed)
Loop recorder 

## 2015-03-01 DIAGNOSIS — Z7901 Long term (current) use of anticoagulants: Secondary | ICD-10-CM | POA: Diagnosis not present

## 2015-03-03 ENCOUNTER — Encounter: Payer: Self-pay | Admitting: Family Medicine

## 2015-03-04 LAB — CUP PACEART REMOTE DEVICE CHECK: Date Time Interrogation Session: 20160916093700

## 2015-03-04 NOTE — Progress Notes (Addendum)
Carelink summary report received. Battery status OK. Normal device function. No new symptom episodes, tachy episodes, brady, or pause episodes. No new AF episodes. Monthly summary reports and ROV with SK in 10/2015.

## 2015-03-07 DIAGNOSIS — Z7901 Long term (current) use of anticoagulants: Secondary | ICD-10-CM | POA: Diagnosis not present

## 2015-03-09 ENCOUNTER — Encounter: Payer: Self-pay | Admitting: Internal Medicine

## 2015-03-15 DIAGNOSIS — Z940 Kidney transplant status: Secondary | ICD-10-CM | POA: Diagnosis not present

## 2015-03-15 DIAGNOSIS — Z952 Presence of prosthetic heart valve: Secondary | ICD-10-CM | POA: Diagnosis not present

## 2015-03-15 DIAGNOSIS — E785 Hyperlipidemia, unspecified: Secondary | ICD-10-CM | POA: Diagnosis not present

## 2015-03-22 ENCOUNTER — Ambulatory Visit (INDEPENDENT_AMBULATORY_CARE_PROVIDER_SITE_OTHER): Payer: Medicare Other | Admitting: *Deleted

## 2015-03-22 DIAGNOSIS — R55 Syncope and collapse: Secondary | ICD-10-CM

## 2015-03-22 NOTE — Progress Notes (Signed)
Loop recorder 

## 2015-03-24 DIAGNOSIS — Z7901 Long term (current) use of anticoagulants: Secondary | ICD-10-CM | POA: Diagnosis not present

## 2015-03-26 LAB — CUP PACEART REMOTE DEVICE CHECK: MDC IDC SESS DTM: 20161004080543

## 2015-03-26 NOTE — Progress Notes (Signed)
Carelink summary report received. Battery status OK. Normal device function. No new symptom episodes, tachy episodes, brady, or pause episodes. No new AF episodes. Monthly summary reports and ROV w/ SK 5/17.

## 2015-03-29 ENCOUNTER — Encounter: Payer: Self-pay | Admitting: Internal Medicine

## 2015-04-04 ENCOUNTER — Ambulatory Visit (INDEPENDENT_AMBULATORY_CARE_PROVIDER_SITE_OTHER): Payer: Medicare Other

## 2015-04-04 DIAGNOSIS — Z23 Encounter for immunization: Secondary | ICD-10-CM | POA: Diagnosis not present

## 2015-04-11 DIAGNOSIS — Z7901 Long term (current) use of anticoagulants: Secondary | ICD-10-CM | POA: Diagnosis not present

## 2015-04-21 ENCOUNTER — Ambulatory Visit (INDEPENDENT_AMBULATORY_CARE_PROVIDER_SITE_OTHER): Payer: Medicare Other | Admitting: *Deleted

## 2015-04-21 DIAGNOSIS — R55 Syncope and collapse: Secondary | ICD-10-CM

## 2015-04-21 NOTE — Progress Notes (Signed)
Loop recorder 

## 2015-04-25 DIAGNOSIS — Z7901 Long term (current) use of anticoagulants: Secondary | ICD-10-CM | POA: Diagnosis not present

## 2015-05-02 ENCOUNTER — Encounter: Payer: Self-pay | Admitting: Internal Medicine

## 2015-05-02 DIAGNOSIS — Z7901 Long term (current) use of anticoagulants: Secondary | ICD-10-CM | POA: Diagnosis not present

## 2015-05-16 DIAGNOSIS — Z952 Presence of prosthetic heart valve: Secondary | ICD-10-CM | POA: Diagnosis not present

## 2015-05-22 LAB — CUP PACEART REMOTE DEVICE CHECK: MDC IDC SESS DTM: 20161103083547

## 2015-05-22 NOTE — Progress Notes (Signed)
Carelink summary report received. Battery status OK. Normal device function. No new symptom episodes, tachy episodes, brady, or pause episodes. No new AF episodes. Monthly summary reports and ROV with SK in 10/2015.

## 2015-05-23 ENCOUNTER — Ambulatory Visit (INDEPENDENT_AMBULATORY_CARE_PROVIDER_SITE_OTHER): Payer: Medicare Other | Admitting: *Deleted

## 2015-05-23 DIAGNOSIS — R55 Syncope and collapse: Secondary | ICD-10-CM

## 2015-05-23 DIAGNOSIS — H353130 Nonexudative age-related macular degeneration, bilateral, stage unspecified: Secondary | ICD-10-CM | POA: Diagnosis not present

## 2015-05-23 NOTE — Progress Notes (Signed)
Carelink Summary Report / Loop Recorder 

## 2015-05-27 ENCOUNTER — Encounter: Payer: Self-pay | Admitting: Cardiology

## 2015-05-30 DIAGNOSIS — N183 Chronic kidney disease, stage 3 (moderate): Secondary | ICD-10-CM | POA: Diagnosis not present

## 2015-05-30 DIAGNOSIS — N2581 Secondary hyperparathyroidism of renal origin: Secondary | ICD-10-CM | POA: Diagnosis not present

## 2015-05-30 DIAGNOSIS — Z7901 Long term (current) use of anticoagulants: Secondary | ICD-10-CM | POA: Diagnosis not present

## 2015-05-30 DIAGNOSIS — M1A30X0 Chronic gout due to renal impairment, unspecified site, without tophus (tophi): Secondary | ICD-10-CM | POA: Diagnosis not present

## 2015-05-30 DIAGNOSIS — Z940 Kidney transplant status: Secondary | ICD-10-CM | POA: Diagnosis not present

## 2015-05-30 DIAGNOSIS — I129 Hypertensive chronic kidney disease with stage 1 through stage 4 chronic kidney disease, or unspecified chronic kidney disease: Secondary | ICD-10-CM | POA: Diagnosis not present

## 2015-06-02 ENCOUNTER — Encounter: Payer: Self-pay | Admitting: Cardiology

## 2015-06-02 LAB — CUP PACEART REMOTE DEVICE CHECK: MDC IDC SESS DTM: 20161203083614

## 2015-06-02 NOTE — Progress Notes (Signed)
Carelink summary report received. Battery status OK. Normal device function. No new symptom episodes, tachy episodes, brady, or pause episodes. No new AF episodes. Monthly summary reports and ROV with SK 10/2015

## 2015-06-09 ENCOUNTER — Encounter: Payer: Self-pay | Admitting: Cardiology

## 2015-06-13 ENCOUNTER — Encounter: Payer: Self-pay | Admitting: Internal Medicine

## 2015-06-16 DIAGNOSIS — Z952 Presence of prosthetic heart valve: Secondary | ICD-10-CM | POA: Diagnosis not present

## 2015-06-21 ENCOUNTER — Ambulatory Visit (INDEPENDENT_AMBULATORY_CARE_PROVIDER_SITE_OTHER): Payer: Medicare Other | Admitting: *Deleted

## 2015-06-21 DIAGNOSIS — R55 Syncope and collapse: Secondary | ICD-10-CM | POA: Diagnosis not present

## 2015-06-21 NOTE — Progress Notes (Signed)
Carelink Summary Report / Loop Recorder 

## 2015-06-24 ENCOUNTER — Other Ambulatory Visit: Payer: Self-pay | Admitting: *Deleted

## 2015-06-24 MED ORDER — LEVOCETIRIZINE DIHYDROCHLORIDE 5 MG PO TABS: 5.0000 mg | ORAL_TABLET | Freq: Every evening | ORAL | Status: DC

## 2015-06-24 NOTE — Telephone Encounter (Signed)
RF request for levocetirizine LOV: 02/10/15 Next ov: None Last written: 11/09/14 #30 w/ 6RF

## 2015-07-01 DIAGNOSIS — Z952 Presence of prosthetic heart valve: Secondary | ICD-10-CM | POA: Diagnosis not present

## 2015-07-20 ENCOUNTER — Ambulatory Visit (INDEPENDENT_AMBULATORY_CARE_PROVIDER_SITE_OTHER): Payer: Medicare Other | Admitting: *Deleted

## 2015-07-20 DIAGNOSIS — R55 Syncope and collapse: Secondary | ICD-10-CM

## 2015-07-20 NOTE — Progress Notes (Signed)
Carelink Summary Report / Loop Recorder 

## 2015-07-21 DIAGNOSIS — Z952 Presence of prosthetic heart valve: Secondary | ICD-10-CM | POA: Diagnosis not present

## 2015-08-03 DIAGNOSIS — Z952 Presence of prosthetic heart valve: Secondary | ICD-10-CM | POA: Diagnosis not present

## 2015-08-04 LAB — CUP PACEART REMOTE DEVICE CHECK: Date Time Interrogation Session: 20170102090538

## 2015-08-04 NOTE — Progress Notes (Signed)
Carelink summary report received. Battery status OK. Normal device function. No new tachy episodes, brady, or pause episodes. No new AF episodes. 1 symptom episode, SR with PAC's.  Monthly summary reports and ROV/PRN

## 2015-08-11 DIAGNOSIS — Z952 Presence of prosthetic heart valve: Secondary | ICD-10-CM | POA: Diagnosis not present

## 2015-08-19 ENCOUNTER — Ambulatory Visit (INDEPENDENT_AMBULATORY_CARE_PROVIDER_SITE_OTHER): Payer: Medicare Other | Admitting: *Deleted

## 2015-08-19 DIAGNOSIS — R55 Syncope and collapse: Secondary | ICD-10-CM

## 2015-08-19 NOTE — Progress Notes (Signed)
Carelink Summary Report / Loop Recorder 

## 2015-08-22 DIAGNOSIS — E785 Hyperlipidemia, unspecified: Secondary | ICD-10-CM | POA: Diagnosis not present

## 2015-08-22 DIAGNOSIS — N2581 Secondary hyperparathyroidism of renal origin: Secondary | ICD-10-CM | POA: Diagnosis not present

## 2015-08-22 DIAGNOSIS — N183 Chronic kidney disease, stage 3 (moderate): Secondary | ICD-10-CM | POA: Diagnosis not present

## 2015-08-22 DIAGNOSIS — Z7901 Long term (current) use of anticoagulants: Secondary | ICD-10-CM | POA: Diagnosis not present

## 2015-08-22 DIAGNOSIS — M1A30X0 Chronic gout due to renal impairment, unspecified site, without tophus (tophi): Secondary | ICD-10-CM | POA: Diagnosis not present

## 2015-08-22 DIAGNOSIS — Z940 Kidney transplant status: Secondary | ICD-10-CM | POA: Diagnosis not present

## 2015-08-22 DIAGNOSIS — I129 Hypertensive chronic kidney disease with stage 1 through stage 4 chronic kidney disease, or unspecified chronic kidney disease: Secondary | ICD-10-CM | POA: Diagnosis not present

## 2015-08-23 LAB — CUP PACEART REMOTE DEVICE CHECK: MDC IDC SESS DTM: 20170201093550

## 2015-08-23 NOTE — Progress Notes (Signed)
Carelink summary report received. Battery status OK. Normal device function. No new symptom episodes, tachy episodes, brady, or pause episodes. No new AF episodes. Monthly summary reports and ROV/PRN 

## 2015-08-25 ENCOUNTER — Encounter: Payer: Self-pay | Admitting: Family Medicine

## 2015-08-26 LAB — CUP PACEART REMOTE DEVICE CHECK: Date Time Interrogation Session: 20170303100549

## 2015-08-26 NOTE — Progress Notes (Signed)
Carelink summary report received. Battery status OK. Normal device function. No new symptom episodes, tachy episodes, brady, or pause episodes. No new AF episodes. Monthly summary reports and ROV/PRN 

## 2015-08-27 ENCOUNTER — Encounter: Payer: Self-pay | Admitting: Family Medicine

## 2015-08-27 DIAGNOSIS — Z125 Encounter for screening for malignant neoplasm of prostate: Secondary | ICD-10-CM | POA: Insufficient documentation

## 2015-08-27 DIAGNOSIS — Z1211 Encounter for screening for malignant neoplasm of colon: Secondary | ICD-10-CM | POA: Insufficient documentation

## 2015-09-04 ENCOUNTER — Encounter: Payer: Self-pay | Admitting: Family Medicine

## 2015-09-13 DIAGNOSIS — Z7901 Long term (current) use of anticoagulants: Secondary | ICD-10-CM | POA: Diagnosis not present

## 2015-09-15 ENCOUNTER — Other Ambulatory Visit: Payer: Self-pay | Admitting: *Deleted

## 2015-09-15 MED ORDER — PANTOPRAZOLE SODIUM 40 MG PO TBEC: 40.0000 mg | DELAYED_RELEASE_TABLET | Freq: Every day | ORAL | Status: DC

## 2015-09-15 NOTE — Telephone Encounter (Signed)
RF request for pantoprazole LOV:02/10/15 Next ov: None Last written: 06/29/14 #30 w/ 6RF

## 2015-09-19 ENCOUNTER — Ambulatory Visit (INDEPENDENT_AMBULATORY_CARE_PROVIDER_SITE_OTHER): Payer: Medicare Other | Admitting: *Deleted

## 2015-09-19 DIAGNOSIS — R55 Syncope and collapse: Secondary | ICD-10-CM

## 2015-09-19 NOTE — Progress Notes (Signed)
Carelink Summary Report / Loop Recorder 

## 2015-09-28 DIAGNOSIS — Z952 Presence of prosthetic heart valve: Secondary | ICD-10-CM | POA: Diagnosis not present

## 2015-10-05 LAB — HM COLONOSCOPY: HM Colonoscopy: NORMAL

## 2015-10-18 ENCOUNTER — Ambulatory Visit (INDEPENDENT_AMBULATORY_CARE_PROVIDER_SITE_OTHER): Payer: Medicare Other | Admitting: *Deleted

## 2015-10-18 DIAGNOSIS — R55 Syncope and collapse: Secondary | ICD-10-CM

## 2015-10-18 NOTE — Progress Notes (Signed)
Carelink Summary Report / Loop Recorder 

## 2015-10-24 DIAGNOSIS — I480 Paroxysmal atrial fibrillation: Secondary | ICD-10-CM | POA: Diagnosis not present

## 2015-10-26 DIAGNOSIS — M24542 Contracture, left hand: Secondary | ICD-10-CM | POA: Diagnosis not present

## 2015-10-26 DIAGNOSIS — M19042 Primary osteoarthritis, left hand: Secondary | ICD-10-CM | POA: Diagnosis not present

## 2015-10-26 DIAGNOSIS — M65332 Trigger finger, left middle finger: Secondary | ICD-10-CM | POA: Diagnosis not present

## 2015-11-07 DIAGNOSIS — Z952 Presence of prosthetic heart valve: Secondary | ICD-10-CM | POA: Diagnosis not present

## 2015-11-12 LAB — CUP PACEART REMOTE DEVICE CHECK: MDC IDC SESS DTM: 20170402100535

## 2015-11-12 NOTE — Progress Notes (Signed)
Carelink summary report received. Battery status OK. Normal device function. No new symptom episodes, tachy episodes, brady, or pause episodes. No new AF episodes. Monthly summary reports and ROV/PRN 

## 2015-11-15 ENCOUNTER — Encounter: Payer: Self-pay | Admitting: Family Medicine

## 2015-11-17 ENCOUNTER — Ambulatory Visit (INDEPENDENT_AMBULATORY_CARE_PROVIDER_SITE_OTHER): Payer: Medicare Other | Admitting: *Deleted

## 2015-11-17 DIAGNOSIS — R55 Syncope and collapse: Secondary | ICD-10-CM

## 2015-11-18 NOTE — Progress Notes (Signed)
Carelink Summary Report / Loop Recorder 

## 2015-11-21 DIAGNOSIS — Z7901 Long term (current) use of anticoagulants: Secondary | ICD-10-CM | POA: Diagnosis not present

## 2015-11-21 DIAGNOSIS — D751 Secondary polycythemia: Secondary | ICD-10-CM | POA: Diagnosis not present

## 2015-11-21 DIAGNOSIS — H353132 Nonexudative age-related macular degeneration, bilateral, intermediate dry stage: Secondary | ICD-10-CM | POA: Diagnosis not present

## 2015-11-21 DIAGNOSIS — N2581 Secondary hyperparathyroidism of renal origin: Secondary | ICD-10-CM | POA: Diagnosis not present

## 2015-11-21 DIAGNOSIS — H524 Presbyopia: Secondary | ICD-10-CM | POA: Diagnosis not present

## 2015-11-21 DIAGNOSIS — Z940 Kidney transplant status: Secondary | ICD-10-CM | POA: Diagnosis not present

## 2015-11-21 DIAGNOSIS — I129 Hypertensive chronic kidney disease with stage 1 through stage 4 chronic kidney disease, or unspecified chronic kidney disease: Secondary | ICD-10-CM | POA: Diagnosis not present

## 2015-11-21 DIAGNOSIS — G43809 Other migraine, not intractable, without status migrainosus: Secondary | ICD-10-CM | POA: Diagnosis not present

## 2015-11-21 DIAGNOSIS — N183 Chronic kidney disease, stage 3 (moderate): Secondary | ICD-10-CM | POA: Diagnosis not present

## 2015-11-21 DIAGNOSIS — H26491 Other secondary cataract, right eye: Secondary | ICD-10-CM | POA: Diagnosis not present

## 2015-11-27 LAB — CUP PACEART REMOTE DEVICE CHECK: Date Time Interrogation Session: 20170502103533

## 2015-11-27 NOTE — Progress Notes (Signed)
Carelink summary report received. Battery status OK. Normal device function. No new symptom episodes, tachy episodes, brady, or pause episodes. No new AF episodes. Monthly summary reports and ROV/PRN 

## 2015-11-28 ENCOUNTER — Encounter: Payer: Self-pay | Admitting: Family Medicine

## 2015-11-28 DIAGNOSIS — M24542 Contracture, left hand: Secondary | ICD-10-CM | POA: Diagnosis not present

## 2015-11-28 DIAGNOSIS — M19042 Primary osteoarthritis, left hand: Secondary | ICD-10-CM | POA: Diagnosis not present

## 2015-11-28 DIAGNOSIS — M65332 Trigger finger, left middle finger: Secondary | ICD-10-CM | POA: Diagnosis not present

## 2015-12-07 DIAGNOSIS — Z952 Presence of prosthetic heart valve: Secondary | ICD-10-CM | POA: Diagnosis not present

## 2015-12-19 ENCOUNTER — Ambulatory Visit (INDEPENDENT_AMBULATORY_CARE_PROVIDER_SITE_OTHER): Payer: Medicare Other | Admitting: *Deleted

## 2015-12-19 DIAGNOSIS — R55 Syncope and collapse: Secondary | ICD-10-CM

## 2015-12-19 NOTE — Progress Notes (Signed)
Carelink Summary Report / Loop Recorder 

## 2015-12-26 ENCOUNTER — Telehealth: Payer: Self-pay | Admitting: *Deleted

## 2015-12-26 NOTE — Telephone Encounter (Signed)
Spoke with John Barton regarding his loop recorder. I made him aware that the battery is at the end of its life and Dr. Caryl Comes will have a discussion with him about options at his appt 01/16/16. He verbalizes understanding.

## 2015-12-27 DIAGNOSIS — Z7901 Long term (current) use of anticoagulants: Secondary | ICD-10-CM | POA: Diagnosis not present

## 2015-12-28 LAB — CUP PACEART REMOTE DEVICE CHECK: MDC IDC SESS DTM: 20170601110535

## 2015-12-30 ENCOUNTER — Telehealth: Payer: Self-pay | Admitting: Cardiology

## 2015-12-30 NOTE — Telephone Encounter (Signed)
LMOVM requesting that pt send manual transmission b/c home monitor has not updated in at least 14 days.    

## 2016-01-06 LAB — CUP PACEART REMOTE DEVICE CHECK: MDC IDC SESS DTM: 20170701120547

## 2016-01-10 DIAGNOSIS — Z952 Presence of prosthetic heart valve: Secondary | ICD-10-CM | POA: Diagnosis not present

## 2016-01-13 ENCOUNTER — Telehealth: Payer: Self-pay | Admitting: Cardiology

## 2016-01-13 NOTE — Telephone Encounter (Signed)
LMOVM requesting that pt send manual transmission b/c home monitor has not updated in at least 14 days.    

## 2016-01-16 ENCOUNTER — Ambulatory Visit (INDEPENDENT_AMBULATORY_CARE_PROVIDER_SITE_OTHER): Payer: Medicare Other | Admitting: Internal Medicine

## 2016-01-16 ENCOUNTER — Encounter: Payer: Self-pay | Admitting: Internal Medicine

## 2016-01-16 ENCOUNTER — Ambulatory Visit (INDEPENDENT_AMBULATORY_CARE_PROVIDER_SITE_OTHER): Payer: Medicare Other | Admitting: *Deleted

## 2016-01-16 ENCOUNTER — Encounter (INDEPENDENT_AMBULATORY_CARE_PROVIDER_SITE_OTHER): Payer: Self-pay

## 2016-01-16 VITALS — BP 130/64 | HR 58 | Ht 70.0 in | Wt 205.0 lb

## 2016-01-16 DIAGNOSIS — M65332 Trigger finger, left middle finger: Secondary | ICD-10-CM | POA: Diagnosis not present

## 2016-01-16 DIAGNOSIS — R000 Tachycardia, unspecified: Secondary | ICD-10-CM

## 2016-01-16 DIAGNOSIS — R55 Syncope and collapse: Secondary | ICD-10-CM

## 2016-01-16 DIAGNOSIS — I255 Ischemic cardiomyopathy: Secondary | ICD-10-CM

## 2016-01-16 DIAGNOSIS — Z952 Presence of prosthetic heart valve: Secondary | ICD-10-CM

## 2016-01-16 DIAGNOSIS — I259 Chronic ischemic heart disease, unspecified: Secondary | ICD-10-CM

## 2016-01-16 DIAGNOSIS — R001 Bradycardia, unspecified: Secondary | ICD-10-CM | POA: Diagnosis not present

## 2016-01-16 DIAGNOSIS — I472 Ventricular tachycardia: Secondary | ICD-10-CM | POA: Diagnosis not present

## 2016-01-16 DIAGNOSIS — Z954 Presence of other heart-valve replacement: Secondary | ICD-10-CM

## 2016-01-16 NOTE — Patient Instructions (Signed)
Medication Instructions: - Your physician recommends that you continue on your current medications as directed. Please refer to the Current Medication list given to you today.  Labwork: - none  Procedures/Testing: - none  Follow-Up: - Your physician wants you to follow-up in: 6 months with Truitt Merle, NP & 1 year with Dr. Caryl Comes. You will receive a reminder letter in the mail two months in advance. If you don't receive a letter, please call our office to schedule the follow-up appointment.  Any Additional Special Instructions Will Be Listed Below (If Applicable).     If you need a refill on your cardiac medications before your next appointment, please call your pharmacy.

## 2016-01-16 NOTE — Progress Notes (Signed)
Carelink Summary Report / Loop Recorder 

## 2016-01-16 NOTE — Progress Notes (Signed)
Patient Care Team: Tammi Sou, MD as PCP - General (Family Medicine) Deboraha Sprang, MD as Consulting Physician (Cardiology) Mauricia Area, MD as Consulting Physician (Nephrology)   HPI  John Barton is a 70 y.o. male Seen in followup for loop recorder implantation. Device now at [facility]   He has a history of syncope with prior myocardial infarction and status post mechanical aortic valve replacement And underwent EP testing demonstrating only nonsustained ventricular tachycardia and nonsustained SVT   He has a history of a wide-complex tachycardia for which he was seen in consultation 2010. For reasons that are not particularly clear, he ended up on Quinaglute.a CBC with differential was obtained March 2014 which was normal   The patient denies chest pain, shortness of breath, nocturnal dyspnea, orthopnea or peripheral edema.  There have been no palpitations, lightheadedness or syncope.   Echo EF 2/14  55%  Past Medical History:  Diagnosis Date  . Arthritis   . Blood transfusion without reported diagnosis   . Carotid stenosis   . 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 Jimmy Footman.  GFR about 65 ml/min as of 11/2015.  Marland Kitchen COPD (chronic obstructive pulmonary disease) ([place])   . Coronary artery disease    CABG 1995.  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 Glaucoma   . Gout    H/O  . H/O paroxysmal supraventricular tachycardia    with wide complex. + Hx of a-fib as well.  Marland Kitchen Heart disease    Ischemic heart disease and mild depressed LV and RV function  . Hyperlipidemia   . Hypertension   . ICD (implantable cardioverter-defibrillator) in place    Normal device check 08/26/15  . 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 Secondary hyperparathyroidism ([place])    parathyroidectomy done; subsequently has hypoparathyroidism  . Sinus bradycardia   . Syncope    He is s/p loop  recorder insertion; as of Dr. Olin Pia f/u 10/2014 he has had no further syncope and no significant arrhythmias (just sinus brady)--was told to f/u 1 yr.   NORMAL DEVICE REMOTE TRANSMISSION--NO ATRIAL FIBRILLATION--as of 09/02/15.  . Trifascicular block    First degree AV block, right bundle branch block left anterior fascicular block    Past Surgical History:  Procedure Laterality Date  . AORTIC VALVE REPLACEMENT  1995   ST.Jude mechanical valve  . CARDIAC CATHETERIZATION  1995  . CORONARY ARTERY BYPASS GRAFT  1995   RCA andLAD  . ELECTROPHYSIOLOGY STUDY N/A 09/11/2012   Procedure: ELECTROPHYSIOLOGY STUDY;  Surgeon: Deboraha Sprang, MD;  Location: [facility] CATH LAB;  Service: Cardiovascular;  Laterality: N/A;  . 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 parathyroidectomy    . TRANSTHORACIC ECHOCARDIOGRAM  08/15/12   Mild LVH and LV dilation, EF 50-55%, normal aortic valve gradients    Current Outpatient Prescriptions  Medication Sig Dispense Refill  . allopurinol (ZYLOPRIM) 100 MG tablet 1 tablet by mouth in the morning 2 tablets by mouth at night    . atorvastatin (LIPITOR) 40 MG tablet Take 40 mg by mouth daily.    . calcitRIOL (ROCALTROL) 0.5 MCG capsule Take 0.5 mcg by mouth daily.  3  . furosemide (LASIX) 40 MG tablet Take 20 mg by mouth daily.     Marland Kitchen lisinopril (PRINIVIL,ZESTRIL) 5 MG tablet Take  5 mg by mouth daily.    . Multiple Vitamins-Minerals (OCUVITE PO) Take 1 tablet by mouth daily.     . mycophenolate (CELLCEPT) 500 MG tablet Take 1,000 mg by mouth 2 (two) times daily.     . Naproxen Sodium (ALEVE PO) Take 1 tablet by mouth daily as needed (pain).    Marland Kitchen warfarin (COUMADIN) 5 MG tablet Take 1.5-2 tablets (7.5-10 mg total) by mouth daily. 24m-Saturday and Sunday 7.580mAll other days 46 tablet 11   No current facility-administered medications for this visit.     No Known  Allergies  Review of Systems negative except from HPI and PMH  Physical Exam BP 130/64   Pulse (!) 58   Ht _0  (1.778 m)   Wt 205 lb (93 kg)   SpO2 95%   BMI 29.41 kg/m  Well developed and nourished in no acute distress HENT normal Neck supple with JVP-6 Clear Regular rate and rhythm, no murmurs or gallops Abd-soft with active BS No Clubbing cyanosis edema Skin-warm and dry A & Oriented  Grossly normal sensory and motor function  Sinus with occasional sinus node pausing. 21/16/48 Right bundle branch block left anterior fascicular block   Assessment and  Plan  Syncope  Wide-complex tachycardia  Loop recorder insertion  Ischemic heart disease and depressed left ventricular function  Aortic valve replacement-mechanical  Kidney transplant  Sinus node dysfunction  Bifascicular block   No interval syncope  Loop recorder and EOS. He would like to leave it in.  Continue current medications.  There is some value for aspirin adjunctive to his warfarin.  We will ask Dr. JDDonley Redderust keep [place]oreabreast of his labs.  Have him see LG in 6 months and SK 12

## 2016-01-17 ENCOUNTER — Encounter: Payer: Self-pay | Admitting: Family Medicine

## 2016-01-18 ENCOUNTER — Encounter: Payer: Self-pay | Admitting: Internal Medicine

## 2016-01-18 ENCOUNTER — Encounter: Payer: Self-pay | Admitting: Cardiology

## 2016-01-19 LAB — CUP PACEART INCLINIC DEVICE CHECK: MDC IDC SESS DTM: 20170803075216

## 2016-01-24 DIAGNOSIS — Z952 Presence of prosthetic heart valve: Secondary | ICD-10-CM | POA: Diagnosis not present

## 2016-01-29 ENCOUNTER — Encounter: Payer: Self-pay | Admitting: Family Medicine

## 2016-02-01 ENCOUNTER — Telehealth: Payer: Self-pay | Admitting: *Deleted

## 2016-02-01 NOTE — Telephone Encounter (Signed)
[facility] requesting call back.  Gave device clinic phone number for return call.  Patient returned call.  Patient agreeable to returning [place] home monitor as his LINQ has reached EOS.  Patient unenrolled from [facility] and return kit ordered to his home address.  Patient verbalizes understanding and is appreciative.

## 2016-02-10 DIAGNOSIS — Z7901 Long term (current) use of anticoagulants: Secondary | ICD-10-CM | POA: Diagnosis not present

## 2016-02-15 ENCOUNTER — Encounter: Payer: Medicare Other | Admitting: *Deleted

## 2016-02-17 ENCOUNTER — Encounter: Payer: Self-pay | Admitting: Cardiology

## 2016-02-21 DIAGNOSIS — N2581 Secondary hyperparathyroidism of renal origin: Secondary | ICD-10-CM | POA: Diagnosis not present

## 2016-02-21 DIAGNOSIS — D751 Secondary polycythemia: Secondary | ICD-10-CM | POA: Diagnosis not present

## 2016-02-21 DIAGNOSIS — I129 Hypertensive chronic kidney disease with stage 1 through stage 4 chronic kidney disease, or unspecified chronic kidney disease: Secondary | ICD-10-CM | POA: Diagnosis not present

## 2016-02-21 DIAGNOSIS — N183 Chronic kidney disease, stage 3 (moderate): Secondary | ICD-10-CM | POA: Diagnosis not present

## 2016-02-21 DIAGNOSIS — Z940 Kidney transplant status: Secondary | ICD-10-CM | POA: Diagnosis not present

## 2016-02-21 DIAGNOSIS — Z7901 Long term (current) use of anticoagulants: Secondary | ICD-10-CM | POA: Diagnosis not present

## 2016-02-23 ENCOUNTER — Encounter: Payer: Self-pay | Admitting: Family Medicine

## 2016-02-27 ENCOUNTER — Encounter: Payer: Self-pay | Admitting: Internal Medicine

## 2016-03-01 ENCOUNTER — Telehealth: Payer: Self-pay

## 2016-03-01 NOTE — Telephone Encounter (Signed)
LM requesting call back to schedule AWV. 

## 2016-03-02 DIAGNOSIS — Z7901 Long term (current) use of anticoagulants: Secondary | ICD-10-CM | POA: Diagnosis not present

## 2016-03-19 DIAGNOSIS — Z7901 Long term (current) use of anticoagulants: Secondary | ICD-10-CM | POA: Diagnosis not present

## 2016-04-06 NOTE — Progress Notes (Signed)
Subjective:   John Barton is a 70 y.o. male who presents for an Initial Medicare Annual Wellness Visit.  The Patient was informed that the wellness visit is to identify future health risk and educate and initiate measures that can reduce risk for increased disease through the lifespan.   Describes health as fair, good or great? "good"  Review of Systems  No ROS.  Medicare Wellness Visit.  Cardiac Risk Factors include: advanced age (>66mn, >>85women);male gender;family history of premature cardiovascular disease;hypertension   Sleep patterns: Takes OTC sleep aid, sleeps 6 hours. Up 1-2 x to void.  Home Safety/Smoke Alarms: Smoke detectors in place.  Living environment; residence and Firearm Safety: Lives alone with cat in 1 story home. Family lives close. Firearms not locked, encouraged to put in safe place.  Seat Belt Safety/Bike Helmet: Wears seatbelt.    Counseling:   Eye Exam-Last exam this year, followed every 6 months by Dr. DBing PlumeDental-Full dentures without issues.  Male:   CCS-10/05/15; pt reported normal.      PSA-Patient is unsure if lab was obtained by nephrologist/cardiologist.    Objective:    Today's Vitals   04/09/16 1257  BP: 128/74  Pulse: 61  SpO2: 95%  Weight: 204 lb 12.8 oz (92.9 kg)  Height: 5' 10"  (1.778 m)   Body mass index is 29.39 kg/m.  Current Medications (verified) Outpatient Encounter Prescriptions as of 04/09/2016  Medication Sig  . allopurinol (ZYLOPRIM) 100 MG tablet 1 tablet by mouth in the morning 2 tablets by mouth at night  . atorvastatin (LIPITOR) 40 MG tablet Take 40 mg by mouth daily.  . calcitRIOL (ROCALTROL) 0.5 MCG capsule Take 0.5 mcg by mouth daily.  .Marland Kitchendextromethorphan-guaiFENesin (MUCINEX DM) 30-600 MG 12hr tablet Take 1 tablet by mouth 2 (two) times daily.  . furosemide (LASIX) 40 MG tablet Take 20 mg by mouth daily.   .Marland Kitchenlisinopril (PRINIVIL,ZESTRIL) 5 MG tablet Take 5 mg by mouth daily.  . Naproxen Sodium (ALEVE  PO) Take 1 tablet by mouth daily as needed (pain).  .Marland Kitchenwarfarin (COUMADIN) 5 MG tablet Take 1.5-2 tablets (7.5-10 mg total) by mouth daily. 176mSaturday and Sunday 7.61m6mll other days  . Multiple Vitamins-Minerals (OCUVITE PO) Take 1 tablet by mouth daily.   . mycophenolate (CELLCEPT) 500 MG tablet Take 1,000 mg by mouth 2 (two) times daily.    No facility-administered encounter medications on file as of 04/09/2016.     Allergies (verified) Review of patient's allergies indicates no known allergies.   History: Past Medical History:  Diagnosis Date  . Arthritis   . Blood transfusion without reported diagnosis   . Carotid stenosis   . 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 DetJimmy FootmanGFR about 60 ml/min as of 02/2016.  . CMarland KitchenPD (chronic obstructive pulmonary disease) ([place] . Coronary artery disease    CABG 1995.  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 Glaucoma   . Gout    H/O  . H/O paroxysmal supraventricular tachycardia    with wide complex. + Hx of a-fib as well.  . HMarland Kitchenart disease    Ischemic heart disease and mild depressed LV and RV function  . Hyperlipidemia   . Hypertension   . ICD (implantable cardioverter-defibrillator) in place    Normal device check 01/28/16.  . 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 Sinus bradycardia   . Syncope    He is s/p loop recorder insertion; as of Dr. Olin Pia f/u 01/16/16 he has had no further syncope and no significant arrhythmias.  He elected to keep the loop recorder in and was told to f/u 1 yr with Dr. Caryl Comes.   NORMAL DEVICE REMOTE TRANSMISSION--NO ATRIAL FIBRILLATION--as of 09/02/15.  . Trifascicular block    First degree AV block, right bundle branch block left anterior fascicular block   Past Surgical History:  Procedure Laterality Date    . AORTIC VALVE REPLACEMENT  1995   [facility] mechanical valve  . CARDIAC CATHETERIZATION  1995  . CORONARY ARTERY BYPASS GRAFT  1995   RCA andLAD  . ELECTROPHYSIOLOGY STUDY N/A 09/11/2012   Procedure: ELECTROPHYSIOLOGY STUDY;  Surgeon: Deboraha Sprang, MD;  Location: [facility] CATH LAB;  Service: Cardiovascular;  Laterality: N/A;  . 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 parathyroidectomy    . TRANSTHORACIC ECHOCARDIOGRAM  08/15/12   Mild LVH and LV dilation, EF 50-55%, normal aortic valve gradients   Family History  Problem Relation Age of Onset  . Heart failure Father   . Heart disease Father   . Hyperlipidemia Father   . Hypertension Father   . Diabetes Father   . Arthritis Mother   . Heart disease Mother   . Hyperlipidemia Mother   . Hypertension Mother   . Heart failure Mother   . Heart disease Brother   . Diabetes Daughter   . Alcohol abuse Brother   . Liver disease Brother     Liver transplant, ETOH   Social History   Occupational History  . retired    Social History Main Topics  . Smoking status: Former Smoker    Types: Cigars    Quit date: 05/29/2014  . Smokeless tobacco: Never Used     Comment: quit cigarettes '06  . Alcohol use 8.4 oz/week    14 Cans of beer per week     Comment: 2 beers/daily  . Drug use: No  . Sexual activity: Not Currently   Tobacco Counseling Counseling given: Not Answered   Activities of Daily Living In your present state of health, do you have any difficulty performing the following activities: 04/09/2016  Hearing? N  Vision? N  Difficulty concentrating or making decisions? N  Walking or climbing stairs? N  Dressing or bathing? N  Doing errands, shopping? N  Preparing Food and eating ? N  Using the Toilet? N  In the past six months, have you accidently leaked urine? N  Do you have problems with loss of bowel control? N  Managing your  Medications? N  Managing your Finances? N  Housekeeping or managing your Housekeeping? N  Some recent data might be hidden    Immunizations and Health Maintenance Immunization History  Administered Date(s) Administered  . Influenza, High Dose Seasonal PF 04/04/2015  . Pneumococcal Conjugate-13 08/11/2014   Health Maintenance Due  Topic Date Due  . Hepatitis C Screening  1946/01/18  . PNA vac Low Risk Adult (2 of 2 - PPSV23) 08/12/2015  . INFLUENZA VACCINE  01/17/2016   Patient would like Flu Vaccine, currently has upper respiratory cough with sinus symptoms. Will get at next visit, when feeling better.   Patient Care Team: Tammi Sou, MD as PCP - General (Family Medicine) Deboraha Sprang, MD as Consulting Physician (Cardiology)  Mauricia Area, MD as Consulting Physician (Nephrology)  Indicate any recent Medical Services you may have received from other than [facility] providers in the past year (date may be approximate).    Assessment:   This is a routine wellness examination for Earle.  Physical assessment deferred to PCP.   Hearing/Vision screen Hearing Screening Comments: Last exam 5 years ago, no problems with conversational tones.  Vision Screening Comments: Last exam this year. F/U in December. Wears glasses.   Dietary issues and exercise activities discussed: Current Exercise Habits: The patient does not participate in regular exercise at present (Yard work, walks yard, deck and to [employer]. House work), Exercise limited by: cardiac condition(s);respiratory conditions(s);orthopedic condition(s)   Diet (meal preparation, eat out, water intake, caffeinated beverages, dairy products, fruits and vegetables): Eats 3 meals/day. Goes out to eat. Drinks 3 cups coffee daily, water, 50/50 tea and lemon aid.  Breakfast: Sausage on english muffin Lunch: Sandwiches, cheeseburger, spaghetti Dinner: Meat, vegetables.      Encouraged patient to increase water intake. Discussed  healthier food options (canned vs fresh; processed foods; frozen dinners). Patient states it's difficult for him to acquire fresh vegetables, also difficult to cook for one person so he chooses to eat out.    Goals    . improve health          Patient would like to improve over all health by increasing exercise and eating healthier. Will start walking more and using resistance bands.       Depression Screen PHQ 2/9 Scores 04/09/2016  PHQ - 2 Score 0    Fall Risk Fall Risk  04/09/2016  Falls in the past year? No    Cognitive Function: MMSE - Mini Mental State Exam 04/09/2016  Orientation to time 5  Orientation to Place 5  Registration 3  Attention/ Calculation 0  Recall 3  Language- name 2 objects 2  Language- repeat 1  Language- follow 3 step command 3  Language- read & follow direction 1  Write a sentence 1  Copy design 1  Total score 25        Screening Tests Health Maintenance  Topic Date Due  . Hepatitis C Screening  Apr 26, 1946  . PNA vac Low Risk Adult (2 of 2 - PPSV23) 08/12/2015  . INFLUENZA VACCINE  01/17/2016  . ZOSTAVAX  08/17/2053 (Originally 03/24/2004)  . TETANUS/TDAP  10/04/2020  . COLONOSCOPY  10/04/2025        Plan:    Begin to eat heart healthy diet (full of fruits, vegetables, whole grains, lean protein, water--limit salt, fat, and sugar intake) and increase physical activity as tolerated.  Begin doing brain stimulating activities (puzzles, reading, adult coloring books, staying active) to keep memory sharp.   Bring a copy of your advanced directives to your next office visit.  Follow up with Dr. Anitra Lauth on Tuesday, October 31st at 11am.     During the course of the visit Kenrick was educated and counseled about the following appropriate screening and preventive services:   Vaccines to include Pneumoccal, Influenza, Hepatitis B, Td, Zostavax, HCV  Colorectal cancer screening  Cardiovascular disease screening  Diabetes  screening  Glaucoma screening  Nutrition counseling  Prostate cancer screening   Patient Instructions (the written plan) were given to the patient.   Gerilyn Nestle, RN   04/09/2016

## 2016-04-06 NOTE — Progress Notes (Signed)
Pre visit review using our clinic review tool, if applicable. No additional management support is needed unless otherwise documented below in the visit note. 

## 2016-04-09 ENCOUNTER — Ambulatory Visit (INDEPENDENT_AMBULATORY_CARE_PROVIDER_SITE_OTHER): Payer: Medicare Other

## 2016-04-09 VITALS — BP 128/74 | HR 61 | Ht 70.0 in | Wt 204.8 lb

## 2016-04-09 DIAGNOSIS — Z7901 Long term (current) use of anticoagulants: Secondary | ICD-10-CM | POA: Diagnosis not present

## 2016-04-09 DIAGNOSIS — Z0000 Encounter for general adult medical examination without abnormal findings: Secondary | ICD-10-CM

## 2016-04-09 NOTE — Patient Instructions (Addendum)
Begin to eat heart healthy diet (full of fruits, vegetables, whole grains, lean protein, water--limit salt, fat, and sugar intake) and increase physical activity as tolerated.  Continue doing brain stimulating activities (puzzles, reading, adult coloring books, staying active) to keep memory sharp.   Bring a copy of your advanced directives to your next office visit.  Follow up with Dr. Anitra Lauth on Tuesday, October 31st at 11am.    Fat and Cholesterol Restricted Diet High levels of fat and cholesterol in your blood may lead to various health problems, such as diseases of the heart, blood vessels, gallbladder, liver, and pancreas. Fats are concentrated sources of energy that come in various forms. Certain types of fat, including saturated fat, may be harmful in excess. Cholesterol is a substance needed by your body in small amounts. Your body makes all the cholesterol it needs. Excess cholesterol comes from the food you eat. When you have high levels of cholesterol and saturated fat in your blood, health problems can develop because the excess fat and cholesterol will gather along the walls of your blood vessels, causing them to narrow. Choosing the right foods will help you control your intake of fat and cholesterol. This will help keep the levels of these substances in your blood within normal limits and reduce your risk of disease. WHAT IS MY PLAN? Your health care provider recommends that you:  Get no more than __________ % of the total calories in your daily diet from fat.  Limit your intake of saturated fat to less than ______% of your total calories each day.  Limit the amount of cholesterol in your diet to less than _________mg per day. WHAT TYPES OF FAT SHOULD I CHOOSE?  Choose healthy fats more often. Choose monounsaturated and polyunsaturated fats, such as olive and canola oil, flaxseeds, walnuts, almonds, and seeds.  Eat more omega-3 fats. Good choices include salmon, mackerel,  sardines, tuna, flaxseed oil, and ground flaxseeds. Aim to eat fish at least two times a week.  Limit saturated fats. Saturated fats are primarily found in animal products, such as meats, butter, and cream. Plant sources of saturated fats include palm oil, palm kernel oil, and coconut oil.  Avoid foods with partially hydrogenated oils in them. These contain trans fats. Examples of foods that contain trans fats are stick margarine, some tub margarines, cookies, crackers, and other baked goods. WHAT GENERAL GUIDELINES DO I NEED TO FOLLOW? These guidelines for healthy eating will help you control your intake of fat and cholesterol:  Check food labels carefully to identify foods with trans fats or high amounts of saturated fat.  Fill one half of your plate with vegetables and green salads.  Fill one fourth of your plate with whole grains. Look for the word "whole" as the first word in the ingredient list.  Fill one fourth of your plate with lean protein foods.  Limit fruit to two servings a day. Choose fruit instead of juice.  Eat more foods that contain soluble fiber. Examples of foods that contain this type of fiber are apples, broccoli, carrots, beans, peas, and barley. Aim to get 20-30 g of fiber per day.  Eat more home-cooked food and less restaurant, buffet, and fast food.  Limit or avoid alcohol.  Limit foods high in starch and sugar.  Limit fried foods.  Cook foods using methods other than frying. Baking, boiling, grilling, and broiling are all great options.  Lose weight if you are overweight. Losing just 5-10% of your initial body weight  can help your overall health and prevent diseases such as diabetes and heart disease. WHAT FOODS CAN I EAT? Grains Whole grains, such as whole wheat or whole grain breads, crackers, cereals, and pasta. Unsweetened oatmeal, bulgur, barley, quinoa, or brown rice. Corn or whole wheat flour tortillas. Vegetables Fresh or frozen vegetables (raw,  steamed, roasted, or grilled). Green salads. Fruits All fresh, canned (in natural juice), or frozen fruits. Meat and Other Protein Products Ground beef (85% or leaner), grass-fed beef, or beef trimmed of fat. Skinless chicken or [place]. Ground chicken or [place]. Pork trimmed of fat. All fish and seafood. Eggs. Dried beans, peas, or lentils. Unsalted nuts or seeds. Unsalted canned or dry beans. Dairy Low-fat dairy products, such as skim or 1% milk, 2% or reduced-fat cheeses, low-fat ricotta or cottage cheese, or plain low-fat yogurt. Fats and Oils Tub margarines without trans fats. Light or reduced-fat mayonnaise and salad dressings. Avocado. Olive, canola, sesame, or safflower oils. Natural peanut or almond butter (choose ones without added sugar and oil). The items listed above may not be a complete list of recommended foods or beverages. Contact your dietitian for more options. WHAT FOODS ARE NOT RECOMMENDED? Grains White bread. White pasta. White rice. Cornbread. Bagels, pastries, and croissants. Crackers that contain trans fat. Vegetables White potatoes. Corn. Creamed or fried vegetables. Vegetables in a cheese sauce. Fruits Dried fruits. Canned fruit in light or heavy syrup. Fruit juice. Meat and Other Protein Products Fatty cuts of meat. Ribs, chicken wings, bacon, sausage, bologna, salami, chitterlings, fatback, hot dogs, bratwurst, and packaged luncheon meats. Liver and organ meats. Dairy Whole or 2% milk, cream, half-and-half, and cream cheese. Whole milk cheeses. Whole-fat or sweetened yogurt. Full-fat cheeses. Nondairy creamers and whipped toppings. Processed cheese, cheese spreads, or cheese curds. Sweets and Desserts Corn syrup, sugars, honey, and molasses. Candy. Jam and jelly. Syrup. Sweetened cereals. Cookies, pies, cakes, donuts, muffins, and ice cream. Fats and Oils Butter, stick margarine, lard, shortening, ghee, or bacon fat. Coconut, palm kernel, or palm  oils. Beverages Alcohol. Sweetened drinks (such as sodas, lemonade, and fruit drinks or punches). The items listed above may not be a complete list of foods and beverages to avoid. Contact your dietitian for more information.   This information is not intended to replace advice given to you by your health care provider. Make sure you discuss any questions you have with your health care provider.   Document Released: 06/04/2005 Document Revised: 06/25/2014 Document Reviewed: 09/02/2013 Elsevier Interactive Patient Education 2016 [place] in [place]  Falls can cause injuries. They can happen to people of all ages. There are many things you can do to make your home safe and to help prevent falls.  WHAT CAN I DO ON THE OUTSIDE OF MY HOME?  Regularly fix the edges of walkways and driveways and fix any cracks.  Remove anything that might make you trip as you walk through a door, such as a raised step or threshold.  Trim any bushes or trees on the path to your home.  Use bright outdoor lighting.  Clear any walking paths of anything that might make someone trip, such as rocks or tools.  Regularly check to see if handrails are loose or broken. Make sure that both sides of any steps have handrails.  Any raised decks and porches should have guardrails on the edges.  Have any leaves, snow, or ice cleared regularly.  Use sand or salt on walking paths during winter.  Clean up any spills in your garage right away. This includes oil or grease spills. WHAT CAN I DO IN THE BATHROOM?   Use night lights.  Install grab bars by the toilet and in the tub and shower. Do not use towel bars as grab bars.  Use non-skid mats or decals in the tub or shower.  If you need to sit down in the shower, use a plastic, non-slip stool.  Keep the floor dry. Clean up any water that spills on the floor as soon as it happens.  Remove soap buildup in the tub or shower regularly.  Attach  bath mats securely with double-sided non-slip rug tape.  Do not have throw rugs and other things on the floor that can make you trip. WHAT CAN I DO IN THE BEDROOM?  Use night lights.  Make sure that you have a light by your bed that is easy to reach.  Do not use any sheets or blankets that are too big for your bed. They should not hang down onto the floor.  Have a firm chair that has side arms. You can use this for support while you get dressed.  Do not have throw rugs and other things on the floor that can make you trip. WHAT CAN I DO IN THE KITCHEN?  Clean up any spills right away.  Avoid walking on wet floors.  Keep items that you use a lot in easy-to-reach places.  If you need to reach something above you, use a strong step stool that has a grab bar.  Keep electrical cords out of the way.  Do not use floor polish or wax that makes floors slippery. If you must use wax, use non-skid floor wax.  Do not have throw rugs and other things on the floor that can make you trip. WHAT CAN I DO WITH MY STAIRS?  Do not leave any items on the stairs.  Make sure that there are handrails on both sides of the stairs and use them. Fix handrails that are broken or loose. Make sure that handrails are as long as the stairways.  Check any carpeting to make sure that it is firmly attached to the stairs. Fix any carpet that is loose or worn.  Avoid having throw rugs at the top or bottom of the stairs. If you do have throw rugs, attach them to the floor with carpet tape.  Make sure that you have a light switch at the top of the stairs and the bottom of the stairs. If you do not have them, ask someone to add them for you. WHAT ELSE CAN I DO TO HELP PREVENT FALLS?  Wear shoes that:  Do not have high heels.  Have rubber bottoms.  Are comfortable and fit you well.  Are closed at the toe. Do not wear sandals.  If you use a stepladder:  Make sure that it is fully opened. Do not climb a  closed stepladder.  Make sure that both sides of the stepladder are locked into place.  Ask someone to hold it for you, if possible.  Clearly mark and make sure that you can see:  Any grab bars or handrails.  First and last steps.  Where the edge of each step is.  Use tools that help you move around (mobility aids) if they are needed. These include:  Canes.  Walkers.  Scooters.  Crutches.  Turn on the lights when you go into a dark area. Replace any light bulbs as  soon as they burn out.  Set up your furniture so you have a clear path. Avoid moving your furniture around.  If any of your floors are uneven, fix them.  If there are any pets around you, be aware of where they are.  Review your medicines with your doctor. Some medicines can make you feel dizzy. This can increase your chance of falling. Ask your doctor what other things that you can do to help prevent falls.   This information is not intended to replace advice given to you by your health care provider. Make sure you discuss any questions you have with your health care provider.   Document Released: 03/31/2009 Document Revised: 10/19/2014 Document Reviewed: 07/09/2014 Elsevier Interactive Patient Education 2016 [place] Maintenance, Male A healthy lifestyle and preventative care can promote health and wellness.  Maintain regular health, dental, and eye exams.  Eat a healthy diet. Foods like vegetables, fruits, whole grains, low-fat dairy products, and lean protein foods contain the nutrients you need and are low in calories. Decrease your intake of foods high in solid fats, added sugars, and salt. Get information about a proper diet from your health care provider, if necessary.  Regular physical exercise is one of the most important things you can do for your health. Most adults should get at least 150 minutes of moderate-intensity exercise (any activity that increases your heart rate and causes  you to sweat) each week. In addition, most adults need muscle-strengthening exercises on 2 or more days a week.   Maintain a healthy weight. The body mass index (BMI) is a screening tool to identify possible weight problems. It provides an estimate of body fat based on height and weight. Your health care provider can find your BMI and can help you achieve or maintain a healthy weight. For males 20 years and older:  A BMI below 18.5 is considered underweight.  A BMI of 18.5 to 24.9 is normal.  A BMI of 25 to 29.9 is considered overweight.  A BMI of 30 and above is considered obese.  Maintain normal blood lipids and cholesterol by exercising and minimizing your intake of saturated fat. Eat a balanced diet with plenty of fruits and vegetables. Blood tests for lipids and cholesterol should begin at age 62 and be repeated every 5 years. If your lipid or cholesterol levels are high, you are over age 92, or you are at high risk for heart disease, you may need your cholesterol levels checked more frequently.Ongoing high lipid and cholesterol levels should be treated with medicines if diet and exercise are not working.  If you smoke, find out from your health care provider how to quit. If you do not use tobacco, do not start.  Lung cancer screening is recommended for adults aged 54-80 years who are at high risk for developing lung cancer because of a history of smoking. A yearly low-dose CT scan of the lungs is recommended for people who have at least a 30-pack-year history of smoking and are current smokers or have quit within the past 15 years. A pack year of smoking is smoking an average of 1 pack of cigarettes a day for 1 year (for example, a 30-pack-year history of smoking could mean smoking 1 pack a day for 30 years or 2 packs a day for 15 years). Yearly screening should continue until the smoker has stopped smoking for at least 15 years. Yearly screening should be stopped for people who develop a  health  problem that would prevent them from having lung cancer treatment.  If you choose to drink alcohol, do not have more than 2 drinks per day. One drink is considered to be 12 oz (360 mL) of beer, 5 oz (150 mL) of wine, or 1.5 oz (45 mL) of liquor.  Avoid the use of street drugs. Do not share needles with anyone. Ask for help if you need support or instructions about stopping the use of drugs.  High blood pressure causes heart disease and increases the risk of stroke. High blood pressure is more likely to develop in:  People who have blood pressure in the end of the normal range (100-139/85-89 mm Hg).  People who are overweight or obese.  People who are African American.  If you are 50-63 years of age, have your blood pressure checked every 3-5 years. If you are 39 years of age or older, have your blood pressure checked every year. You should have your blood pressure measured twice--once when you are at a hospital or clinic, and once when you are not at a hospital or clinic. Record the average of the two measurements. To check your blood pressure when you are not at a hospital or clinic, you can use:  An automated blood pressure machine at a pharmacy.  A home blood pressure monitor.  If you are 47-34 years old, ask your health care provider if you should take aspirin to prevent heart disease.  Diabetes screening involves taking a blood sample to check your fasting blood sugar level. This should be done once every 3 years after age 55 if you are at a normal weight and without risk factors for diabetes. Testing should be considered at a younger age or be carried out more frequently if you are overweight and have at least 1 risk factor for diabetes.  Colorectal cancer can be detected and often prevented. Most routine colorectal cancer screening begins at the age of 67 and continues through age 31. However, your health care provider may recommend screening at an earlier age if you have risk  factors for colon cancer. On a yearly basis, your health care provider may provide home test kits to check for hidden blood in the stool. A small camera at the end of a tube may be used to directly examine the colon (sigmoidoscopy or colonoscopy) to detect the earliest forms of colorectal cancer. Talk to your health care provider about this at age 44 when routine screening begins. A direct exam of the colon should be repeated every 5-10 years through age 42, unless early forms of precancerous polyps or small growths are found.  People who are at an increased risk for hepatitis B should be screened for this virus. You are considered at high risk for hepatitis B if:  You were born in a country where hepatitis B occurs often. Talk with your health care provider about which countries are considered high risk.  Your parents were born in a high-risk country and you have not received a shot to protect against hepatitis B (hepatitis B vaccine).  You have HIV or AIDS.  You use needles to inject street drugs.  You live with, or have sex with, someone who has hepatitis B.  You are a man who has sex with other men (MSM).  You get hemodialysis treatment.  You take certain medicines for conditions like cancer, organ transplantation, and autoimmune conditions.  Hepatitis C blood testing is recommended for all people born from [birth year] through 1967  and any individual with known risk factors for hepatitis C.  Healthy men should no longer receive prostate-specific antigen (PSA) blood tests as part of routine cancer screening. Talk to your health care provider about prostate cancer screening.  Testicular cancer screening is not recommended for adolescents or adult males who have no symptoms. Screening includes self-exam, a health care provider exam, and other screening tests. Consult with your health care provider about any symptoms you have or any concerns you have about testicular cancer.  Practice safe sex.  Use condoms and avoid high-risk sexual practices to reduce the spread of sexually transmitted infections (STIs).  You should be screened for STIs, including gonorrhea and chlamydia if:  You are sexually active and are younger than 24 years.  You are older than 24 years, and your health care provider tells you that you are at risk for this type of infection.  Your sexual activity has changed since you were last screened, and you are at an increased risk for chlamydia or gonorrhea. Ask your health care provider if you are at risk.  If you are at risk of being infected with HIV, it is recommended that you take a prescription medicine daily to prevent HIV infection. This is called pre-exposure prophylaxis (PrEP). You are considered at risk if:  You are a man who has sex with other men (MSM).  You are a heterosexual man who is sexually active with multiple partners.  You take drugs by injection.  You are sexually active with a partner who has HIV.  Talk with your health care provider about whether you are at high risk of being infected with HIV. If you choose to begin PrEP, you should first be tested for HIV. You should then be tested every 3 months for as long as you are taking PrEP.  Use sunscreen. Apply sunscreen liberally and repeatedly throughout the day. You should seek shade when your shadow is shorter than you. Protect yourself by wearing long sleeves, pants, a wide-brimmed hat, and sunglasses year round whenever you are outdoors.  Tell your health care provider of new moles or changes in moles, especially if there is a change in shape or color. Also, tell your health care provider if a mole is larger than the size of a pencil eraser.  A one-time screening for abdominal aortic aneurysm (AAA) and surgical repair of large AAAs by ultrasound is recommended for men aged 48-75 years who are current or former smokers.  Stay current with your vaccines (immunizations).   This information is  not intended to replace advice given to you by your health care provider. Make sure you discuss any questions you have with your health care provider.   Document Released: 12/01/2007 Document Revised: 06/25/2014 Document Reviewed: 10/30/2010 Elsevier Interactive Patient Education [employer].

## 2016-04-09 NOTE — Progress Notes (Signed)
Reviewed Medicare Wellness encounter done by Roderic Ovens RN today. I agree with her findings and plan.  Signed:  Crissie Sickles, MD           04/09/2016

## 2016-04-09 NOTE — Progress Notes (Signed)
Noted Medicare Wellness encounter done by Roderic Ovens. Agree with her A/P.  Signed:  Crissie Sickles, MD           04/09/2016

## 2016-04-17 ENCOUNTER — Ambulatory Visit (INDEPENDENT_AMBULATORY_CARE_PROVIDER_SITE_OTHER): Payer: Medicare Other | Admitting: Family Medicine

## 2016-04-17 ENCOUNTER — Encounter: Payer: Self-pay | Admitting: Family Medicine

## 2016-04-17 VITALS — BP 109/68 | HR 55 | Temp 98.2°F | Resp 16 | Wt 203.1 lb

## 2016-04-17 DIAGNOSIS — Z23 Encounter for immunization: Secondary | ICD-10-CM

## 2016-04-17 DIAGNOSIS — R058 Other specified cough: Secondary | ICD-10-CM

## 2016-04-17 DIAGNOSIS — I10 Essential (primary) hypertension: Secondary | ICD-10-CM | POA: Diagnosis not present

## 2016-04-17 DIAGNOSIS — R05 Cough: Secondary | ICD-10-CM

## 2016-04-17 DIAGNOSIS — Z7901 Long term (current) use of anticoagulants: Secondary | ICD-10-CM | POA: Diagnosis not present

## 2016-04-17 DIAGNOSIS — J449 Chronic obstructive pulmonary disease, unspecified: Secondary | ICD-10-CM

## 2016-04-17 DIAGNOSIS — E7800 Pure hypercholesterolemia, unspecified: Secondary | ICD-10-CM | POA: Diagnosis not present

## 2016-04-17 MED ORDER — ATORVASTATIN CALCIUM 80 MG PO TABS: 80.0000 mg | ORAL_TABLET | Freq: Every day | ORAL | 3 refills | Status: AC

## 2016-04-17 NOTE — Progress Notes (Signed)
OFFICE VISIT  04/17/2016   CC:  Chief Complaint  Patient presents with  . Follow-up    cough   HPI:    Patient is a 70 y.o.  male who presents for upper airway cough syndrome.  I have not seen John Barton for over a year. This has been stable, just had an bout of URI with cough on top of things last week but this has resolved. No fevers, no wheezing or SOB.  Sputum lessening gradually.  Chronic probs: he seems to be very well followed/covered by his nephrologist (Dr. Jimmy Barton) and his cardiologist, not only with his renal and cardiac diagnoses, but regarding his HTN and HLD as well.  Pt tells me Dr. Jimmy Barton increased his atorva from 40 to 67m qd at some point since I saw him last.  COPD: denies wheezing or SOB with his usual activities.   Past Medical History:  Diagnosis Date  . Arthritis   . Blood transfusion without reported diagnosis   . Carotid stenosis   . 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 DJimmy Barton  GFR about 60 ml/min as of 02/2016.  .Marland KitchenCOPD (chronic obstructive pulmonary disease) [place]   . Coronary artery disease    CABG 1995.  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 Glaucoma   . Gout    H/O  . H/O paroxysmal supraventricular tachycardia    with wide complex. + Hx of a-fib as well.  .Marland KitchenHeart disease    Ischemic heart disease and mild depressed LV and RV function  . Hyperlipidemia   . Hypertension   . ICD (implantable cardioverter-defibrillator) in place    Normal device check 01/28/16.  . 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 Sinus bradycardia   . Syncope    He is s/p loop recorder insertion; as of Dr. KOlin Piaf/u 01/16/16 he has had no further syncope and no significant arrhythmias.  He elected to keep the loop recorder in and was told to f/u 1 yr with Dr.  kCaryl Comes   NORMAL DEVICE REMOTE TRANSMISSION--NO ATRIAL FIBRILLATION--as of 09/02/15.  . Trifascicular block    First degree AV block, right bundle branch block left anterior fascicular block    Past Surgical History:  Procedure Laterality Date  . AORTIC VALVE REPLACEMENT  1995   [facility] mechanical valve  . CARDIAC CATHETERIZATION  1995  . CORONARY ARTERY BYPASS GRAFT  1995   RCA andLAD  . ELECTROPHYSIOLOGY STUDY N/A 09/11/2012   Procedure: ELECTROPHYSIOLOGY STUDY;  Surgeon: SDeboraha Sprang MD;  [facility];  Service: Cardiovascular;  Laterality: N/A;  . 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 parathyroidectomy    . TRANSTHORACIC ECHOCARDIOGRAM  08/15/12   Mild LVH and LV dilation, EF 50-55%, normal aortic valve gradients   MEDS: takes 80 mg atorv qd Outpatient Medications Prior to Visit  Medication Sig Dispense Refill  . allopurinol (ZYLOPRIM) 100 MG tablet 1 tablet by mouth in the morning 2 tablets by mouth at night    . calcitRIOL (ROCALTROL) 0.5 MCG capsule Take 0.5 mcg by mouth daily.  3  . dextromethorphan-guaiFENesin (MUCINEX DM) 30-600 MG 12hr tablet Take 1 tablet by mouth 2 (two) times daily.    . furosemide (LASIX) 40 MG tablet Take  20 mg by mouth daily.     Marland Kitchen lisinopril (PRINIVIL,ZESTRIL) 5 MG tablet Take 5 mg by mouth daily.    . Multiple Vitamins-Minerals (OCUVITE PO) Take 1 tablet by mouth daily.     . mycophenolate (CELLCEPT) 500 MG tablet Take 1,000 mg by mouth 2 (two) times daily.     . Naproxen Sodium (ALEVE PO) Take 1 tablet by mouth daily as needed (pain).    Marland Kitchen warfarin (COUMADIN) 5 MG tablet Take 1.5-2 tablets (7.5-10 mg total) by mouth daily. 41m-Saturday and _0 /31/2017

## 2016-04-17 NOTE — Progress Notes (Signed)
Pre visit review using our clinic review tool, if applicable. No additional management support is needed unless otherwise documented below in the visit note. 

## 2016-05-04 DIAGNOSIS — Z7901 Long term (current) use of anticoagulants: Secondary | ICD-10-CM | POA: Diagnosis not present

## 2016-05-21 DIAGNOSIS — I129 Hypertensive chronic kidney disease with stage 1 through stage 4 chronic kidney disease, or unspecified chronic kidney disease: Secondary | ICD-10-CM | POA: Diagnosis not present

## 2016-05-21 DIAGNOSIS — N183 Chronic kidney disease, stage 3 (moderate): Secondary | ICD-10-CM | POA: Diagnosis not present

## 2016-05-21 DIAGNOSIS — M1A30X0 Chronic gout due to renal impairment, unspecified site, without tophus (tophi): Secondary | ICD-10-CM | POA: Diagnosis not present

## 2016-05-21 DIAGNOSIS — Z940 Kidney transplant status: Secondary | ICD-10-CM | POA: Diagnosis not present

## 2016-05-21 DIAGNOSIS — N2581 Secondary hyperparathyroidism of renal origin: Secondary | ICD-10-CM | POA: Diagnosis not present

## 2016-05-21 DIAGNOSIS — E785 Hyperlipidemia, unspecified: Secondary | ICD-10-CM | POA: Diagnosis not present

## 2016-05-21 DIAGNOSIS — Z7901 Long term (current) use of anticoagulants: Secondary | ICD-10-CM | POA: Diagnosis not present

## 2016-05-21 DIAGNOSIS — D751 Secondary polycythemia: Secondary | ICD-10-CM | POA: Diagnosis not present

## 2016-05-23 ENCOUNTER — Encounter: Payer: Self-pay | Admitting: Family Medicine

## 2016-05-28 DIAGNOSIS — H353132 Nonexudative age-related macular degeneration, bilateral, intermediate dry stage: Secondary | ICD-10-CM | POA: Diagnosis not present

## 2016-05-28 DIAGNOSIS — G43809 Other migraine, not intractable, without status migrainosus: Secondary | ICD-10-CM | POA: Diagnosis not present

## 2016-05-28 DIAGNOSIS — H26491 Other secondary cataract, right eye: Secondary | ICD-10-CM | POA: Diagnosis not present

## 2016-05-29 DIAGNOSIS — Z7901 Long term (current) use of anticoagulants: Secondary | ICD-10-CM | POA: Diagnosis not present

## 2016-05-29 DIAGNOSIS — N429 Disorder of prostate, unspecified: Secondary | ICD-10-CM | POA: Diagnosis not present

## 2016-06-06 DIAGNOSIS — Z7901 Long term (current) use of anticoagulants: Secondary | ICD-10-CM | POA: Diagnosis not present

## 2016-06-21 DIAGNOSIS — Z7901 Long term (current) use of anticoagulants: Secondary | ICD-10-CM | POA: Diagnosis not present

## 2016-07-12 ENCOUNTER — Encounter: Payer: Self-pay | Admitting: *Deleted

## 2016-07-16 DIAGNOSIS — Z7901 Long term (current) use of anticoagulants: Secondary | ICD-10-CM | POA: Diagnosis not present

## 2016-07-16 NOTE — Progress Notes (Signed)
CARDIOLOGY OFFICE NOTE  Date:  07/17/2016    John Barton Date of Birth: 1946/03/30 Medical Record #[id number]  PCP:  Tammi Sou, MD  Cardiologist:  Caryl Comes  Chief Complaint  Patient presents with  . Cardiac Valve Problem  . Coronary Artery Disease    6 month check - seen for Dr. Caryl Comes    History of Present Illness: John Barton is a 71 y.o. male who presents today for a 6 month check. Seen for Dr. Caryl Comes. He is a former patient of Dr. Sherryl Barters.   He has a complex past medical history. He has known CAD with remote CABG to the RCA and LAD as well as prior St. Jude AVR. He had SVT in 2010. For reasons that are not particularly clear, he ended up on Quinaglute. He has had a loop in place. Other issues include prior kidney transplant - sees Dr. Jimmy Footman. Other issues as noted below. Has bifascicular block.    I last saw him 4 years ago.   Last seen back in July by Dr. Caryl Comes and noted device was at [facility] - he elected to leave it in - does not look like it was replaced. Otherwise felt to be doing ok.    Comes in today. Here alone. He feels like he is doing well. Still working two days a week. No chest pain. Not smoking. Weight is down 2 pounds since last visit here. Not short of breath. He is pretty content with how he is doing. Labs checked by renal. Coumadin monitored by renal as well. No falls. Tolerating his medicines. No syncope or dizziness noted.   Past Medical History:  Diagnosis Date  . Arthritis   . Blood transfusion without reported diagnosis   . Carotid stenosis   . 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 Jimmy Footman.  GFR about 60 ml/min as of 05/2016.  Marland Kitchen COPD (chronic obstructive pulmonary disease) ([place])   . Coronary artery disease    CABG 1995.  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 Glaucoma   . Gout    H/O  . H/O paroxysmal supraventricular tachycardia    with  wide complex. + Hx of a-fib as well.  Marland Kitchen Heart disease    Ischemic heart disease and mild depressed LV and RV function  . Hyperlipidemia   . Hypertension   . ICD (implantable cardioverter-defibrillator) in place    Normal device check 01/28/16.  Marland Kitchen Post-transplant erythrocytosis   . 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 Secondary hyperparathyroidism ([place])    parathyroidectomy done; subsequently has hypoparathyroidism  . Sinus bradycardia   . Syncope    He is s/p loop recorder insertion; as of Dr. Olin Pia f/u 01/16/16 he has had no further syncope and no significant arrhythmias.  He elected to keep the loop recorder in and was told to f/u 1 yr with Dr. Caryl Comes.   NORMAL DEVICE REMOTE TRANSMISSION--NO ATRIAL FIBRILLATION--as of 09/02/15.  . Trifascicular block    First degree AV block, right bundle branch block left anterior fascicular block    Past Surgical History:  Procedure Laterality Date  . AORTIC VALVE REPLACEMENT  1995   ST.Jude mechanical valve  . CARDIAC CATHETERIZATION  1995  . CORONARY ARTERY BYPASS GRAFT  1995   RCA andLAD  . ELECTROPHYSIOLOGY STUDY N/A 09/11/2012   Procedure: ELECTROPHYSIOLOGY STUDY;  Surgeon: Deboraha Sprang,  MD;  Location: Agency CATH LAB;  Service: Cardiovascular;  Laterality: N/A;  . 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 parathyroidectomy    . TRANSTHORACIC ECHOCARDIOGRAM  08/15/12   Mild LVH and LV dilation, EF 50-55%, normal aortic valve gradients     Medications: Current Outpatient Prescriptions  Medication Sig Dispense Refill  . allopurinol (ZYLOPRIM) 100 MG tablet 1 tablet by mouth in the morning 2 tablets by mouth at night    . atorvastatin (LIPITOR) 80 MG tablet Take 1 tablet (80 mg total) by mouth daily. 90 tablet 3  . calcitRIOL (ROCALTROL) 0.5 MCG capsule Take 0.5 mcg by mouth daily.  3  . furosemide (LASIX)  80 MG tablet Take 80 mg by mouth daily.    Marland Kitchen lisinopril (PRINIVIL,ZESTRIL) 5 MG tablet Take 5 mg by mouth daily.    . Multiple Vitamins-Minerals (OCUVITE PO) Take 1 tablet by mouth daily.     . mycophenolate (CELLCEPT) 500 MG tablet Take 1,000 mg by mouth 2 (two) times daily.     . Naproxen Sodium (ALEVE PO) Take 1 tablet by mouth daily as needed (pain).    . tamsulosin (FLOMAX) 0.4 MG CAPS capsule     . warfarin (COUMADIN) 5 MG tablet Take 1.5-2 tablets (7.5-10 mg total) by mouth daily. 18m-Saturday and Sunday 7.521mAll other days 46 tablet 11   No current facility-administered medications for this visit.     Allergies: No Known Allergies  Social History: The patient  reports that he quit smoking about 2 years ago. His smoking use included Cigars. He has never used smokeless tobacco. He reports that he drinks about 8.4 oz of alcohol per week . He reports that he does not use drugs.   Family History: The patient's family history includes Alcohol abuse in his brother; Arthritis in his mother; Diabetes in his daughter and father; Heart disease in his brother, father, and mother; Heart failure in his father and mother; Hyperlipidemia in his father and mother; Hypertension in his father and mother; Liver disease in his brother.   Review of Systems: Please see the history of present illness.   Otherwise, the review of systems is positive for none.   All other systems are reviewed and negative.   Physical Exam: VS:  BP 140/62   Pulse 60   Ht 5' 10.5" (1.791 m)   Wt 203 lb 12.8 oz (92.4 kg)   SpO2 95%   BMI 28.83 kg/m  .  BMI Body mass index is 28.83 kg/m.  Wt Readings from Last 3 Encounters:  07/17/16 203 lb 12.8 oz (92.4 kg)  04/17/16 203 lb 1.9 oz (92.1 kg)  04/09/16 204 lb 12.8 oz (92.9 kg)    General: Pleasant. He is quite talkative. He is alert and in no acute distress.   HEENT: Normal.  Neck: Supple, no JVD, carotid bruits, or masses noted.  Cardiac: Regular rate and  rhythm. Valve is crisp. No murmurs, rubs, or gallops. No edema.  Respiratory:  Lungs are clear to auscultation bilaterally with normal work of breathing.  GI: Soft and nontender.  MS: No deformity or atrophy. Gait and ROM intact.  Skin: Warm and dry. Color is normal.  Neuro:  Strength and sensation are intact and no gross focal deficits noted.  Psych: Alert, appropriate and with normal affect.   LABORATORY DATA:  EKG:  EKG is not ordered today.   Lab Results  Component Value  Date   WBC 6.7 11/17/2013   HGB 13.6 08/29/2012   HCT 44 11/17/2013   PLT 174 11/17/2013   GLUCOSE 92 09/11/2012   CHOL  12/05/2008    150        ATP III CLASSIFICATION:  <200     mg/dL   Desirable  200-239  mg/dL   Borderline High  >=240    mg/dL   High          TRIG 175 (H) 12/05/2008   HDL 28 (L) 12/05/2008   LDLCALC  12/05/2008    87        Total Cholesterol/HDL:CHD Risk Coronary Heart Disease Risk Table                     Men   Women  1/2 Average Risk   3.4   3.3  Average Risk       5.0   4.4  2 X Average Risk   9.6   7.1  3 X Average Risk  23.4   11.0        Use the calculated Patient Ratio above and the CHD Risk Table to determine the patient's CHD Risk.        ATP III CLASSIFICATION (LDL):  <100     mg/dL   Optimal  100-129  mg/dL   Near or Above                    Optimal  130-159  mg/dL   Borderline  160-189  mg/dL   High  >190     mg/dL   Very High   ALT 23 11/17/2013   AST 25 11/17/2013   NA 137 11/17/2013   K 5.3 11/17/2013   CL 100 11/17/2013   CREATININE 1.34 11/17/2013   BUN 29 (A) 11/17/2013   CO2 28 09/11/2012   TSH 1.351 Test methodology is 3rd generation TSH 12/04/2008   INR 2.6 11/17/2013   HGBA1C  12/04/2008    5.8 (NOTE) The ADA recommends the following therapeutic goal for glycemic control related to Hgb A1c measurement: Goal of therapy: <6.5 Hgb A1c  Reference: American Diabetes Association: Clinical Practice Recommendations 2010, Diabetes Care, 2010, 33:  (Suppl  1).    BNP (last 3 results) No results for input(s): BNP in the last 8760 hours.  ProBNP (last 3 results) No results for input(s): PROBNP in the last 8760 hours.   Other Studies Reviewed Today:  Myoview Impression from 2014 Exercise Capacity:  Lexiscan with low level exercise. BP Response:  Normal blood pressure response. Clinical Symptoms:  There is dyspnea. ECG Impression:  No significant ST segment change suggestive of ischemia. Comparison with Prior Nuclear Study: No images to compare  Overall Impression:  Low risk stress nuclear study with a large, severe, partially reversible inferior defect consistent with prior inferior infarct and minimal peri-infarct ischemia.  LV Ejection Fraction: 53%.  LV Wall Motion:  Inferior hypokinesis.  Kirk Ruths   Echo Study Conclusions from 2014  - Left ventricle: The cavity size was mildly dilated. Wall thickness was increased in a pattern of mild LVH. Systolic function was normal. The estimated ejection fraction was in the range of 50% to 55%. - Aortic valve: AV prosthesis is difficult to see well. Peak and mean gradients through the valve are normal at 25 and 12 mm Hg respectively. - Mitral valve: Calcified annulus. Mildly thickened leaflets . - Right ventricle: There was mild hypertrophy. Systolic function was  mildly reduced.  Assessment/Plan:  1. Syncope - has not recurred. Bifascicular block noted. He knows to call us for any symptoms.   2. History of Wide-complex tachycardia and sinus node dysfunction. No symptoms noted.   3. Underlying loop recorder - has reached ERI - has been elected to leave in.   4. Ischemic heart disease and depressed left ventricular function - mild LV dysfunction by last echo - seems compensated. Myoview from 2014 noted. He is doing well clinically.   5. Prior Aortic valve replacement - mechanical - on coumadin. Needs echo updated.   6. History of kidney  transplant - followed by nephrology.   7. Chronic coumadin therapy - monitored by nephrology. No obvious problems noted.    Current medicines are reviewed with the patient today.  The patient does not have concerns regarding medicines other than what has been noted above.  The following changes have been made:  See above.  Labs/ tests ordered today include:    Orders Placed This Encounter  Procedures  . ECHOCARDIOGRAM COMPLETE     Disposition:   FU with Dr. Caryl Comes in 6 months. I will see back in a year.   Patient is agreeable to this plan and will call if any problems develop in the interim.   SignedTruitt Merle, NP  07/17/2016 3:17 PM  [facility] [address] [place], [place]  [address] Phone: [phone] Fax: [phone]

## 2016-07-17 ENCOUNTER — Ambulatory Visit (INDEPENDENT_AMBULATORY_CARE_PROVIDER_SITE_OTHER): Payer: Medicare Other | Admitting: Nurse Practitioner

## 2016-07-17 ENCOUNTER — Encounter: Payer: Self-pay | Admitting: Nurse Practitioner

## 2016-07-17 VITALS — BP 140/62 | HR 60 | Ht 70.5 in | Wt 203.8 lb

## 2016-07-17 DIAGNOSIS — Z952 Presence of prosthetic heart valve: Secondary | ICD-10-CM | POA: Diagnosis not present

## 2016-07-17 NOTE — Patient Instructions (Addendum)
We will be checking the following labs today - NONE   Medication Instructions:    Continue with your current medicines.     Testing/Procedures To Be Arranged:  Echocardiogram  Follow-Up:   See Dr. Caryl Comes in 6 months. I will see you back in one year.     Other Special Instructions:   Keep up the good work!    If you need a refill on your cardiac medications before your next appointment, please call your pharmacy.   Call the [place] office at [phone] if you have any questions, problems or concerns.

## 2016-07-22 ENCOUNTER — Encounter: Payer: Self-pay | Admitting: Family Medicine

## 2016-07-31 ENCOUNTER — Other Ambulatory Visit: Payer: Self-pay

## 2016-07-31 ENCOUNTER — Ambulatory Visit (HOSPITAL_COMMUNITY): Payer: Medicare Other | Attending: Cardiovascular Disease

## 2016-07-31 ENCOUNTER — Encounter: Payer: Self-pay | Admitting: Physician Assistant

## 2016-07-31 DIAGNOSIS — Z952 Presence of prosthetic heart valve: Secondary | ICD-10-CM | POA: Insufficient documentation

## 2016-08-14 DIAGNOSIS — Z7901 Long term (current) use of anticoagulants: Secondary | ICD-10-CM | POA: Diagnosis not present

## 2016-08-20 ENCOUNTER — Ambulatory Visit (INDEPENDENT_AMBULATORY_CARE_PROVIDER_SITE_OTHER): Payer: Medicare Other | Admitting: Family Medicine

## 2016-08-20 ENCOUNTER — Encounter: Payer: Self-pay | Admitting: Family Medicine

## 2016-08-20 VITALS — BP 110/66 | HR 69 | Temp 98.1°F | Resp 16 | Wt 204.0 lb

## 2016-08-20 DIAGNOSIS — S76112A Strain of left quadriceps muscle, fascia and tendon, initial encounter: Secondary | ICD-10-CM

## 2016-08-20 NOTE — Progress Notes (Signed)
OFFICE VISIT  08/20/2016   CC:  Chief Complaint  Patient presents with  . Leg Pain    Left leg pain, stumbled over boxes    HPI:    Patient is a 71 y.o. Caucasian male who presents for left leg pain. Onset about 2.5 weeks ago. Accidentally backed into a box in his hallway, fell backwards, felt lateral aspect of left thigh start hurting 1-2 d/later.    It has been improving since it happened.  Took an aleve 1 time. No hurting in low back or hip.  No bruising over the area of pain.     Past Medical History:  Diagnosis Date  . Arthritis   . Blood transfusion without reported diagnosis   . Carotid stenosis   . 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 Jimmy Footman.  GFR about 60 ml/min as of 05/2016.  Marland Kitchen COPD (chronic obstructive pulmonary disease) ([place])   . Coronary artery disease    CABG 1995.  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 Glaucoma   . Gout    H/O  . H/O paroxysmal supraventricular tachycardia    with wide complex. + Hx of a-fib as well.  . Hyperlipidemia   . Hypertension   . ICD (implantable cardioverter-defibrillator) in place    Normal device check 01/28/16.  . Ischemic cardiomyopathy    Ischemic heart disease and mild depressed LV and RV function  . Post-transplant erythrocytosis   . 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 Secondary hyperparathyroidism ([place])    parathyroidectomy done; subsequently has hypoparathyroidism  . Sinus bradycardia   . Syncope    He is s/p loop recorder insertion; as of Dr. Olin Pia f/u 01/16/16 he has had no further syncope and no significant arrhythmias.  He elected to keep the loop recorder in and was told to f/u 1 yr with Dr. Caryl Comes.   NORMAL DEVICE REMOTE TRANSMISSION--NO  ATRIAL FIBRILLATION--as of 09/02/15.  . Trifascicular block    First degree AV block, right bundle branch block left anterior fascicular block    Past Surgical History:  Procedure Laterality Date  . AORTIC VALVE REPLACEMENT  1995   ST.Jude mechanical valve  . CARDIAC CATHETERIZATION  1995  . CORONARY ARTERY BYPASS GRAFT  1995   RCA andLAD  . ELECTROPHYSIOLOGY STUDY N/A 09/11/2012   Procedure: ELECTROPHYSIOLOGY STUDY;  Surgeon: Deboraha Sprang, MD;  [facility];  Service: Cardiovascular;  Laterality: N/A;  . 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 parathyroidectomy    . TRANSTHORACIC ECHOCARDIOGRAM  08/15/12   Mild LVH and LV dilation, EF 50-55%, normal aortic valve gradients    Outpatient Medications Prior to Visit  Medication Sig Dispense Refill  . allopurinol (ZYLOPRIM) 100 MG tablet 1 tablet by mouth in the morning 2 tablets by mouth at night    . atorvastatin (LIPITOR) 80 MG tablet Take 1 tablet (80 mg total) by mouth daily. 90 tablet 3  . calcitRIOL (ROCALTROL) 0.5 MCG capsule Take 0.5 mcg by mouth daily.  3  . furosemide (LASIX) 80 MG tablet Take 80 mg by mouth daily.    Marland Kitchen lisinopril (PRINIVIL,ZESTRIL) 5 MG tablet Take  5 mg by mouth daily.    . Multiple Vitamins-Minerals (OCUVITE PO) Take 1 tablet by mouth daily.     . mycophenolate (CELLCEPT) 500 MG tablet Take 1,000 mg by mouth 2 (two) times daily.     . Naproxen Sodium (ALEVE PO) Take 1 tablet by mouth daily as needed (pain).    . tamsulosin (FLOMAX) 0.4 MG CAPS capsule     . warfarin (COUMADIN) 5 MG tablet Take 1.5-2 tablets (7.5-10 mg total) by mouth daily. 88m-Saturday and Sunday 7.562mAll other days 46 tablet 11   No facility-administered medications prior to visit.     No Known Allergies  ROS As per HPI  PE: Blood pressure 110/66, pulse 69, temperature 98.1 F (36.7 C), temperature source Oral, resp. rate  16, weight 204 lb (92.5 kg), SpO2 95 %. Gen: Alert, well appearing.  Patient is oriented to person, place, time, and situation. AFFECT: pleasant, lucid thought and speech. L greater troch nontender. TTP along quadriceps muscle, mainly in its lateral aspect.  No knee pain, no popliteal area TTP. Extension at knee elicits no pain.  Thigh flexion/ext no pain.  Left hip IR/ER elicits no pain.   LABS:    Chemistry      Component Value Date/Time   NA 137 11/17/2013   K 5.3 11/17/2013   CL 100 11/17/2013   CO2 28 09/11/2012 0820   BUN 29 (A) 11/17/2013   CREATININE 1.34 11/17/2013      Component Value Date/Time   CALCIUM 10.0 11/17/2013   ALKPHOS 101 11/17/2013   AST 25 11/17/2013   ALT 23 11/17/2013   BILITOT 0.7 12/11/2008 0600     IMPRESSION AND PLAN:  Left quadriceps strain: discussed treatment with heating pad 20 min bid-tid. I printed out some stretches for him to do for his quadriceps muscle.  An After Visit Summary was printed and given to the patient.  FOLLOW UP: Return if symptoms worsen or fail to improve.  Signed:  PhCrissie SicklesMD           08/20/2016

## 2016-08-20 NOTE — Progress Notes (Signed)
Pre visit review using our clinic review tool, if applicable. No additional management support is needed unless otherwise documented below in the visit note. 

## 2016-09-03 DIAGNOSIS — Z7901 Long term (current) use of anticoagulants: Secondary | ICD-10-CM | POA: Diagnosis not present

## 2016-09-11 DIAGNOSIS — M1A30X0 Chronic gout due to renal impairment, unspecified site, without tophus (tophi): Secondary | ICD-10-CM | POA: Diagnosis not present

## 2016-09-11 DIAGNOSIS — N183 Chronic kidney disease, stage 3 (moderate): Secondary | ICD-10-CM | POA: Diagnosis not present

## 2016-09-11 DIAGNOSIS — I129 Hypertensive chronic kidney disease with stage 1 through stage 4 chronic kidney disease, or unspecified chronic kidney disease: Secondary | ICD-10-CM | POA: Diagnosis not present

## 2016-09-11 DIAGNOSIS — Z7901 Long term (current) use of anticoagulants: Secondary | ICD-10-CM | POA: Diagnosis not present

## 2016-09-11 DIAGNOSIS — N2581 Secondary hyperparathyroidism of renal origin: Secondary | ICD-10-CM | POA: Diagnosis not present

## 2016-09-11 DIAGNOSIS — Z940 Kidney transplant status: Secondary | ICD-10-CM | POA: Diagnosis not present

## 2016-09-11 LAB — POCT INR: INR: 4.7 — AB (ref <=1.1)

## 2016-09-16 DIAGNOSIS — E119 Type 2 diabetes mellitus without complications: Secondary | ICD-10-CM

## 2016-09-16 HISTORY — DX: Type 2 diabetes mellitus without complications: E11.9

## 2016-10-01 ENCOUNTER — Encounter: Payer: Self-pay | Admitting: Family Medicine

## 2016-10-01 DIAGNOSIS — N183 Chronic kidney disease, stage 3 (moderate): Secondary | ICD-10-CM | POA: Diagnosis not present

## 2016-10-01 DIAGNOSIS — Z952 Presence of prosthetic heart valve: Secondary | ICD-10-CM | POA: Diagnosis not present

## 2016-10-15 ENCOUNTER — Encounter: Payer: Self-pay | Admitting: Family Medicine

## 2016-10-15 ENCOUNTER — Ambulatory Visit (INDEPENDENT_AMBULATORY_CARE_PROVIDER_SITE_OTHER): Payer: Medicare Other | Admitting: Family Medicine

## 2016-10-15 VITALS — BP 106/64 | HR 67 | Temp 97.7°F | Resp 16 | Ht 69.5 in | Wt 197.8 lb

## 2016-10-15 DIAGNOSIS — Z1211 Encounter for screening for malignant neoplasm of colon: Secondary | ICD-10-CM

## 2016-10-15 DIAGNOSIS — Z0000 Encounter for general adult medical examination without abnormal findings: Secondary | ICD-10-CM | POA: Diagnosis not present

## 2016-10-15 DIAGNOSIS — Z23 Encounter for immunization: Secondary | ICD-10-CM | POA: Diagnosis not present

## 2016-10-15 LAB — CBC WITH DIFFERENTIAL/PLATELET
BASOS PCT: 0.9 % (ref 0.0–3.0)
Basophils Absolute: 0.1 10*3/uL (ref 0.0–0.1)
Eosinophils Absolute: 0.2 10*3/uL (ref 0.0–0.7)
Eosinophils Relative: 2.5 % (ref 0.0–5.0)
HEMATOCRIT: 42.1 % (ref 39.0–52.0)
HEMOGLOBIN: 13.6 g/dL (ref 13.0–17.0)
LYMPHS PCT: 12.1 % (ref 12.0–46.0)
Lymphs Abs: 0.9 10*3/uL (ref 0.7–4.0)
MCHC: 32.4 g/dL (ref 30.0–36.0)
MCV: 87.6 fl (ref 78.0–100.0)
Monocytes Absolute: 0.7 10*3/uL (ref 0.1–1.0)
Monocytes Relative: 8.8 % (ref 3.0–12.0)
NEUTROS ABS: 5.8 10*3/uL (ref 1.4–7.7)
Neutrophils Relative %: 75.7 % (ref 43.0–77.0)
PLATELETS: 181 10*3/uL (ref 150.0–400.0)
RBC: 4.81 Mil/uL (ref 4.22–5.81)
RDW: 15.6 % — AB (ref 11.5–15.5)
WBC: 7.7 10*3/uL (ref 4.0–10.5)

## 2016-10-15 LAB — COMPREHENSIVE METABOLIC PANEL
ALBUMIN: 4.8 g/dL (ref 3.5–5.2)
ALT: 16 U/L (ref 0–53)
AST: 20 U/L (ref 0–37)
Alkaline Phosphatase: 104 U/L (ref 39–117)
BUN: 35 mg/dL — AB (ref 6–23)
CHLORIDE: 99 meq/L (ref 96–112)
CO2: 28 meq/L (ref 19–32)
Calcium: 9.7 mg/dL (ref 8.4–10.5)
Creatinine, Ser: 1.32 mg/dL (ref 0.40–1.50)
GFR: 56.58 mL/min — ABNORMAL LOW (ref >60.00)
Glucose, Bld: 116 mg/dL — ABNORMAL HIGH (ref 70–99)
POTASSIUM: 3.9 meq/L (ref 3.5–5.1)
SODIUM: 137 meq/L (ref 135–145)
Total Bilirubin: 0.7 mg/dL (ref 0.2–1.2)
Total Protein: 7.3 g/dL (ref 6.0–8.3)

## 2016-10-15 LAB — LIPID PANEL
CHOL/HDL RATIO: 4
CHOLESTEROL: 149 mg/dL (ref 0–200)
HDL: 37.3 mg/dL — ABNORMAL LOW (ref >39.00)
LDL CALC: 81 mg/dL (ref 0–99)
NonHDL: 111.23
TRIGLYCERIDES: 151 mg/dL — AB (ref 0.0–149.0)
VLDL: 30.2 mg/dL (ref 0.0–40.0)

## 2016-10-15 LAB — TSH: TSH: 2.62 u[IU]/mL (ref 0.35–4.50)

## 2016-10-15 NOTE — Addendum Note (Signed)
Addended by: Onalee Hua on: 10/15/2016 09:00 AM   Modules accepted: Orders

## 2016-10-15 NOTE — Progress Notes (Signed)
Pre visit review using our clinic review tool, if applicable. No additional management support is needed unless otherwise documented below in the visit note. 

## 2016-10-15 NOTE — Progress Notes (Signed)
Office Note 10/15/2016  CC:  Chief Complaint  Patient presents with  . Annual Exam    Pt is fasting.     HPI:  John Barton is a 71 y.o. male who is here for annual health maintenance exam. No acute complaints.   Past Medical History:  Diagnosis Date  . Arthritis   . Blood transfusion without reported diagnosis   . Carotid stenosis   . 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 Jimmy Footman.  GFR about 60 ml/min as of 05/2016.  Marland Kitchen COPD (chronic obstructive pulmonary disease) ([place])   . Coronary artery disease    CABG 1995.  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 Glaucoma   . Gout    H/O  . H/O paroxysmal supraventricular tachycardia    with wide complex. + Hx of a-fib as well.  . Hyperlipidemia   . Hypertension   . ICD (implantable cardioverter-defibrillator) in place    Normal device check 01/28/16.  . Ischemic cardiomyopathy    Ischemic heart disease and mild depressed LV and RV function  . Post-transplant erythrocytosis   . 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 Secondary hyperparathyroidism ([place])    parathyroidectomy done; subsequently has hypoparathyroidism  . Sinus bradycardia   . Syncope    He is s/p loop recorder insertion; as of Dr. Olin Pia f/u 01/16/16 he has had no further syncope and no significant arrhythmias.  He elected to keep the loop recorder in and was told to f/u 1 yr with Dr. Caryl Comes.   NORMAL DEVICE REMOTE TRANSMISSION--NO ATRIAL FIBRILLATION--as of 09/02/15.  . Trifascicular block    First degree AV block, right bundle branch block left anterior fascicular block    Past Surgical History:  Procedure Laterality Date  . AORTIC VALVE REPLACEMENT  1995   ST.Jude mechanical valve  . CARDIAC  CATHETERIZATION  1995  . CORONARY ARTERY BYPASS GRAFT  1995   RCA andLAD  . ELECTROPHYSIOLOGY STUDY N/A 09/11/2012   Procedure: ELECTROPHYSIOLOGY STUDY;  Surgeon: Deboraha Sprang, MD;  Location: [facility] CATH LAB;  Service: Cardiovascular;  Laterality: N/A;  . 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 parathyroidectomy    . TRANSTHORACIC ECHOCARDIOGRAM  08/15/12   Mild LVH and LV dilation, EF 50-55%, normal aortic valve gradients    Family History  Problem Relation Age of Onset  . Heart failure Father   . Heart disease Father   . Hyperlipidemia Father   . Hypertension Father   . Diabetes Father   . Arthritis Mother   . Heart disease Mother   . Hyperlipidemia Mother   . Hypertension Mother   . Heart failure Mother   . Heart disease Brother   . Diabetes Daughter   . Alcohol abuse Brother   . Liver disease Brother     Liver transplant, ETOH    Social History   Social History  . Marital status: Divorced    Spouse name: N/A  . Number of children: 1  . Years of education: N/A   Occupational History  . retired    Social History Main Topics  . Smoking status: Former Smoker    Types:  Cigars    Quit date: 05/29/2014  . Smokeless tobacco: Never Used     Comment: quit cigarettes '06  . Alcohol use 8.4 oz/week    14 Cans of beer per week     Comment: 2 beers/daily  . Drug use: No  . Sexual activity: Not Currently   Other Topics Concern  . Not on file   Social History Narrative   [name] lives alone. He is retired. He has 1 grown daughter.    Outpatient Medications Prior to Visit  Medication Sig Dispense Refill  . allopurinol (ZYLOPRIM) 100 MG tablet 1 tablet by mouth in the morning 2 tablets by mouth at night    . atorvastatin (LIPITOR) 80 MG tablet Take 1 tablet (80 mg total) by mouth daily. 90 tablet 3  . calcitRIOL (ROCALTROL) 0.5 MCG capsule Take 0.5 mcg by mouth daily.  3   . furosemide (LASIX) 80 MG tablet Take 80 mg by mouth daily.    Marland Kitchen lisinopril (PRINIVIL,ZESTRIL) 5 MG tablet Take 5 mg by mouth daily.    . Multiple Vitamins-Minerals (OCUVITE PO) Take 1 tablet by mouth daily.     . mycophenolate (CELLCEPT) 500 MG tablet Take 1,000 mg by mouth 2 (two) times daily.     . Naproxen Sodium (ALEVE PO) Take 1 tablet by mouth daily as needed (pain).    . tamsulosin (FLOMAX) 0.4 MG CAPS capsule Take 0.4 mg by mouth daily.     Marland Kitchen warfarin (COUMADIN) 5 MG tablet Take 1.5-2 tablets (7.5-10 mg total) by mouth daily. 69m-Saturday and Sunday 7.549mAll other days (Patient taking differently: Take 5 mg by mouth daily. Monday, Wednesday and Friday) 46 tablet 11   No facility-administered medications prior to visit.     No Known Allergies  ROS Review of Systems  Constitutional: Negative for appetite change, chills, fatigue and fever.  HENT: Negative for congestion, dental problem, ear pain and sore throat.   Eyes: Negative for discharge, redness and visual disturbance.  Respiratory: Negative for cough, chest tightness, shortness of breath and wheezing.   Cardiovascular: Negative for chest pain, palpitations and leg swelling.  Gastrointestinal: Negative for abdominal pain, blood in stool, diarrhea, nausea and vomiting.  Genitourinary: Negative for difficulty urinating, dysuria, flank pain, frequency, hematuria and urgency.  Musculoskeletal: Negative for arthralgias, back pain, joint swelling, myalgias and neck stiffness.  Skin: Negative for pallor and rash.  Neurological: Negative for dizziness, speech difficulty, weakness and headaches.  Hematological: Negative for adenopathy. Does not bruise/bleed easily.  Psychiatric/Behavioral: Negative for confusion and sleep disturbance. The patient is not nervous/anxious.     PE; Blood pressure 106/64, pulse 67, temperature 97.7 F (36.5 C), temperature source Oral, resp. rate 16, height 5' 9.5" (1.765 m), weight 197 lb 12 oz  (89.7 kg), SpO2 94 %. Gen: Alert, well appearing.  Patient is oriented to person, place, time, and situation. AFFECT: pleasant, lucid thought and speech. ENT: Ears: EACs clear, normal epithelium.  TMs with good light reflex and landmarks bilaterally.  Eyes: no injection, icteris, swelling, or exudate.  EOMI, PERRLA. Nose: no drainage or turbinate edema/swelling.  No injection or focal lesion.  Mouth: lips without lesion/swelling.  Oral mucosa pink and moist.  Dentition intact and without obvious caries or gingival swelling.  Oropharynx without erythema, exudate, or swelling.  Neck: supple/nontender.  No LAD, mass, or TM.  Carotid pulses 2+ bilaterally, without bruits. CV: RRR, mechanical S2, no m/r/g.   LUNGS: CTA bilat, nonlabored resps, good aeration in all lung fields.  ABD: soft, NT, ND, BS normal.  No hepatospenomegaly or mass.  No bruits. EXT: no clubbing, cyanosis, or edema.  Musculoskeletal: no joint swelling, erythema, warmth, or tenderness.  ROM of all joints intact. Skin - no sores or suspicious lesions or rashes or color changes Rectal: deferred/declined  Pertinent labs:   Lab Results  Component Value Date   WBC 6.7 11/17/2013   HGB 13.6 08/29/2012   HCT 44 11/17/2013   MCV 88.8 08/29/2012   PLT 174 11/17/2013   Lab Results  Component Value Date   CREATININE 1.34 11/17/2013   BUN 29 (A) 11/17/2013   NA 137 11/17/2013   K 5.3 11/17/2013   CL 100 11/17/2013   CO2 28 09/11/2012   Lab Results  Component Value Date   ALT 23 11/17/2013   AST 25 11/17/2013   ALKPHOS 101 11/17/2013   BILITOT 0.7 12/11/2008   Lab Results  Component Value Date   CHOL  12/05/2008    150        ATP III CLASSIFICATION:  <200     mg/dL   Desirable  200-239  mg/dL   Borderline High  >=240    mg/dL   High          Lab Results  Component Value Date   HDL 28 (L) 12/05/2008   Lab Results  Component Value Date   LDLCALC  12/05/2008    87        Total Cholesterol/HDL:CHD Risk Coronary  Heart Disease Risk Table                     Men   Women  1/2 Average Risk   3.4   3.3  Average Risk       5.0   4.4  2 X Average Risk   9.6   7.1  3 X Average Risk  23.4   11.0        Use the calculated Patient Ratio above and the CHD Risk Table to determine the patient's CHD Risk.        ATP III CLASSIFICATION (LDL):  <100     mg/dL   Optimal  100-129  mg/dL   Near or Above                    Optimal  130-159  mg/dL   Borderline  160-189  mg/dL   High  >190     mg/dL   Very High   Lab Results  Component Value Date   TRIG 175 (H) 12/05/2008   Lab Results  Component Value Date   CHOLHDL 5.4 12/05/2008   Lab Results  Component Value Date   HGBA1C  12/04/2008    5.8 (NOTE) The ADA recommends the following therapeutic goal for glycemic control related to Hgb A1c measurement: Goal of therapy: <6.5 Hgb A1c  Reference: American Diabetes Association: Clinical Practice Recommendations 2010, Diabetes Care, 2010, 33: (Suppl  1).   ASSESSMENT AND PLAN:   Health maintenance exam: Reviewed age and gender appropriate health maintenance issues (prudent diet, regular exercise, health risks of tobacco and excessive alcohol, use of seatbelts, fire alarms in home, use of sunscreen).  Also reviewed age and gender appropriate health screening as well as vaccine recommendations. Fasting HP labs. Pneumovax 23, o/w UTD. Colon ca screening: declines colonoscopy at this time (he has never had one).  Will do iFOB today. Prostate ca screening: he chooses to decline prostate cancer any further.  An  After Visit Summary was printed and given to the patient.  FOLLOW UP:  Return in about 6 months (around 04/16/2017) for routine chronic illness f/u.  Signed:  Crissie Sickles, MD           10/15/2016

## 2016-10-16 ENCOUNTER — Other Ambulatory Visit: Payer: Self-pay | Admitting: Family Medicine

## 2016-10-16 ENCOUNTER — Encounter: Payer: Self-pay | Admitting: Family Medicine

## 2016-10-16 ENCOUNTER — Other Ambulatory Visit (INDEPENDENT_AMBULATORY_CARE_PROVIDER_SITE_OTHER): Payer: Medicare Other

## 2016-10-16 DIAGNOSIS — R7309 Other abnormal glucose: Secondary | ICD-10-CM | POA: Diagnosis not present

## 2016-10-16 DIAGNOSIS — E119 Type 2 diabetes mellitus without complications: Secondary | ICD-10-CM

## 2016-10-16 LAB — HEMOGLOBIN A1C: Hgb A1c MFr Bld: 6.6 % — ABNORMAL HIGH (ref 4.6–6.5)

## 2016-10-22 ENCOUNTER — Other Ambulatory Visit (INDEPENDENT_AMBULATORY_CARE_PROVIDER_SITE_OTHER): Payer: Medicare Other

## 2016-10-22 DIAGNOSIS — Z1211 Encounter for screening for malignant neoplasm of colon: Secondary | ICD-10-CM | POA: Diagnosis not present

## 2016-10-22 LAB — FECAL OCCULT BLOOD, IMMUNOCHEMICAL: FECAL OCCULT BLD: NEGATIVE

## 2016-10-25 DIAGNOSIS — Z952 Presence of prosthetic heart valve: Secondary | ICD-10-CM | POA: Diagnosis not present

## 2016-11-13 DIAGNOSIS — Z952 Presence of prosthetic heart valve: Secondary | ICD-10-CM | POA: Diagnosis not present

## 2016-11-30 DIAGNOSIS — Z7901 Long term (current) use of anticoagulants: Secondary | ICD-10-CM | POA: Diagnosis not present

## 2016-12-03 DIAGNOSIS — H353132 Nonexudative age-related macular degeneration, bilateral, intermediate dry stage: Secondary | ICD-10-CM | POA: Diagnosis not present

## 2016-12-03 DIAGNOSIS — G43809 Other migraine, not intractable, without status migrainosus: Secondary | ICD-10-CM | POA: Diagnosis not present

## 2016-12-03 DIAGNOSIS — H26491 Other secondary cataract, right eye: Secondary | ICD-10-CM | POA: Diagnosis not present

## 2016-12-04 DIAGNOSIS — Z7901 Long term (current) use of anticoagulants: Secondary | ICD-10-CM | POA: Diagnosis not present

## 2016-12-04 DIAGNOSIS — I129 Hypertensive chronic kidney disease with stage 1 through stage 4 chronic kidney disease, or unspecified chronic kidney disease: Secondary | ICD-10-CM | POA: Diagnosis not present

## 2016-12-04 DIAGNOSIS — M1A30X0 Chronic gout due to renal impairment, unspecified site, without tophus (tophi): Secondary | ICD-10-CM | POA: Diagnosis not present

## 2016-12-04 DIAGNOSIS — E785 Hyperlipidemia, unspecified: Secondary | ICD-10-CM | POA: Diagnosis not present

## 2016-12-04 DIAGNOSIS — N183 Chronic kidney disease, stage 3 (moderate): Secondary | ICD-10-CM | POA: Diagnosis not present

## 2016-12-04 DIAGNOSIS — N2581 Secondary hyperparathyroidism of renal origin: Secondary | ICD-10-CM | POA: Diagnosis not present

## 2016-12-04 DIAGNOSIS — Z940 Kidney transplant status: Secondary | ICD-10-CM | POA: Diagnosis not present

## 2016-12-24 DIAGNOSIS — Z7901 Long term (current) use of anticoagulants: Secondary | ICD-10-CM | POA: Diagnosis not present

## 2016-12-31 ENCOUNTER — Ambulatory Visit: Payer: Self-pay | Admitting: Nurse Practitioner

## 2017-01-07 DIAGNOSIS — Z952 Presence of prosthetic heart valve: Secondary | ICD-10-CM | POA: Diagnosis not present

## 2017-01-28 DIAGNOSIS — Z7901 Long term (current) use of anticoagulants: Secondary | ICD-10-CM | POA: Diagnosis not present

## 2017-02-11 DIAGNOSIS — Z7901 Long term (current) use of anticoagulants: Secondary | ICD-10-CM | POA: Diagnosis not present

## 2017-02-19 DIAGNOSIS — Z7901 Long term (current) use of anticoagulants: Secondary | ICD-10-CM | POA: Diagnosis not present

## 2017-02-25 DIAGNOSIS — Z7901 Long term (current) use of anticoagulants: Secondary | ICD-10-CM | POA: Diagnosis not present

## 2017-03-05 DIAGNOSIS — Z23 Encounter for immunization: Secondary | ICD-10-CM | POA: Diagnosis not present

## 2017-03-05 DIAGNOSIS — N2581 Secondary hyperparathyroidism of renal origin: Secondary | ICD-10-CM | POA: Diagnosis not present

## 2017-03-05 DIAGNOSIS — N183 Chronic kidney disease, stage 3 (moderate): Secondary | ICD-10-CM | POA: Diagnosis not present

## 2017-03-05 DIAGNOSIS — M1A30X0 Chronic gout due to renal impairment, unspecified site, without tophus (tophi): Secondary | ICD-10-CM | POA: Diagnosis not present

## 2017-03-05 DIAGNOSIS — Z940 Kidney transplant status: Secondary | ICD-10-CM | POA: Diagnosis not present

## 2017-03-05 DIAGNOSIS — I129 Hypertensive chronic kidney disease with stage 1 through stage 4 chronic kidney disease, or unspecified chronic kidney disease: Secondary | ICD-10-CM | POA: Diagnosis not present

## 2017-03-05 DIAGNOSIS — Z7901 Long term (current) use of anticoagulants: Secondary | ICD-10-CM | POA: Diagnosis not present

## 2017-03-12 DIAGNOSIS — Z7901 Long term (current) use of anticoagulants: Secondary | ICD-10-CM | POA: Diagnosis not present

## 2017-03-18 ENCOUNTER — Encounter: Payer: Self-pay | Admitting: Family Medicine

## 2017-03-26 DIAGNOSIS — Z7901 Long term (current) use of anticoagulants: Secondary | ICD-10-CM | POA: Diagnosis not present

## 2017-04-09 DIAGNOSIS — Z7901 Long term (current) use of anticoagulants: Secondary | ICD-10-CM | POA: Diagnosis not present

## 2017-04-15 ENCOUNTER — Ambulatory Visit: Payer: Self-pay | Admitting: Family Medicine

## 2017-04-22 ENCOUNTER — Encounter: Payer: Self-pay | Admitting: Family Medicine

## 2017-04-22 ENCOUNTER — Ambulatory Visit (INDEPENDENT_AMBULATORY_CARE_PROVIDER_SITE_OTHER): Payer: Medicare Other | Admitting: Family Medicine

## 2017-04-22 ENCOUNTER — Ambulatory Visit: Payer: Self-pay

## 2017-04-22 VITALS — BP 118/62 | HR 63 | Temp 98.2°F | Resp 18 | Ht 70.0 in | Wt 198.4 lb

## 2017-04-22 DIAGNOSIS — N183 Chronic kidney disease, stage 3 unspecified: Secondary | ICD-10-CM

## 2017-04-22 DIAGNOSIS — Z940 Kidney transplant status: Secondary | ICD-10-CM | POA: Diagnosis not present

## 2017-04-22 DIAGNOSIS — Z0000 Encounter for general adult medical examination without abnormal findings: Secondary | ICD-10-CM | POA: Diagnosis not present

## 2017-04-22 DIAGNOSIS — E119 Type 2 diabetes mellitus without complications: Secondary | ICD-10-CM

## 2017-04-22 DIAGNOSIS — E7800 Pure hypercholesterolemia, unspecified: Secondary | ICD-10-CM | POA: Diagnosis not present

## 2017-04-22 DIAGNOSIS — I10 Essential (primary) hypertension: Secondary | ICD-10-CM

## 2017-04-22 LAB — POCT GLYCOSYLATED HEMOGLOBIN (HGB A1C): HEMOGLOBIN A1C: 6.1

## 2017-04-22 NOTE — Progress Notes (Signed)
OFFICE VISIT  04/22/2017   CC:  Chief Complaint  Patient presents with  . Medicare Wellness  F/u chronic illness  HPI:    Patient is a 71 y.o.  male who presents for f/u DM, HTN, HLD, CRI stage III. He is s/p kidney transplant and is followed by Dr. Jimmy Footman, most recent f/u with him was 03/05/17. Labs showed stable renal function at that time.  Uric acid was 6.0, glucoser 124, GFR 52 ml/min, K 4.6. Hepatic panel, UA, and CBC all normal. INR's managed via Dr. Deterding/[facility] (coordinating).  DM 2: diet controlled.  However, he says "I eat whatever I want".  "I don't consider myself a diabetic". HTN: he does not monitor his bp at home.  Compliant with lisinopril and lasix. HLD: taking atorvastatin daily w/out side effect.  CRI: takes only occ aleve, no ibup.  Tries to maintain good fluid balance.  Past Medical History:  Diagnosis Date  . Arthritis   . Blood transfusion without reported diagnosis   . Carotid stenosis   . 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 Jimmy Footman.  GFR stable at 52 ml/min (Cr 1.36) 03/05/17.  Marland Kitchen COPD (chronic obstructive pulmonary disease) ([place])   . Coronary artery disease    CABG 1995.  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 Diabetes mellitus without complication ([place]) 08/4740   New dx 10/15/16 by A1c criteria (6.6%)--recommended nutritionist referral/no meds at initial dx.  . Glaucoma   . Gout    H/O  . H/O paroxysmal supraventricular tachycardia    with wide complex. + Hx of a-fib as well.  . Hyperlipidemia   . Hypertension   . ICD (implantable cardioverter-defibrillator) in place    Normal device check 01/28/16.  . Ischemic cardiomyopathy    Ischemic heart disease and mild depressed LV and RV function  . Post-transplant erythrocytosis   . S/P aortic valve replacement 1995   mechanical (for bicuspid aortic valve)  //  Echo 2/18: mild LVH, EF  45-50, diff HK, Gr 1 DD, severe MAC, mild Mitral Stenosis, normally functioning mechanical AVR (mean 12, peak 25)  . Secondary hyperparathyroidism ([place])    parathyroidectomy done; subsequently has hypoparathyroidism  . Sinus bradycardia   . Syncope    He is s/p loop recorder insertion; as of Dr. Olin Pia f/u 01/16/16 he has had no further syncope and no significant arrhythmias.  He elected to keep the loop recorder in and was told to f/u 1 yr with Dr. Caryl Comes.   NORMAL DEVICE REMOTE TRANSMISSION--NO ATRIAL FIBRILLATION--as of 09/02/15.  . Trifascicular block    First degree AV block, right bundle branch block left anterior fascicular block    Past Surgical History:  Procedure Laterality Date  . AORTIC VALVE REPLACEMENT  1995   ST.Jude mechanical valve  . CARDIAC CATHETERIZATION  1995  . CORONARY ARTERY BYPASS GRAFT  1995   RCA andLAD  . 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 parathyroidectomy    . TRANSTHORACIC ECHOCARDIOGRAM  08/15/12; 08/03/16   2014: Mild LVH and LV dilation, EF 50-55%, normal aortic valve gradients.  2018: EF 45-50%, normal functioning prosthetic AV, diffuse hypokinesis, grd I DD, mild mitral stenosis    Outpatient Medications Prior to Visit  Medication Sig Dispense Refill  . allopurinol (ZYLOPRIM) 100 MG tablet 1 tablet by mouth in the morning 2 tablets  by mouth at night    . atorvastatin (LIPITOR) 80 MG tablet Take 1 tablet (80 mg total) by mouth daily. 90 tablet 3  . calcitRIOL (ROCALTROL) 0.5 MCG capsule Take 0.5 mcg by mouth daily.  3  . furosemide (LASIX) 80 MG tablet Take 80 mg by mouth daily.    Marland Kitchen lisinopril (PRINIVIL,ZESTRIL) 5 MG tablet Take 5 mg by mouth daily.    . Multiple Vitamins-Minerals (OCUVITE PO) Take 1 tablet by mouth daily.     . mycophenolate (CELLCEPT) 500 MG tablet Take 1,000 mg by mouth 2 (two) times daily.     . Naproxen Sodium (ALEVE PO) Take 1  tablet by mouth daily as needed (pain).    . tamsulosin (FLOMAX) 0.4 MG CAPS capsule Take 0.4 mg by mouth daily.     Marland Kitchen warfarin (COUMADIN) 2 MG tablet Take 2 mg by mouth. Takes Sunday, Tuesday, Thursday, Saturday     . warfarin (COUMADIN) 5 MG tablet Take 1.5-2 tablets (7.5-10 mg total) by mouth daily. 73m-Saturday and Sunday 7.556mAll other days (Patient taking differently: Take daily by mouth. Monday, Wednesday and Friday) 46 tablet 11   No facility-administered medications prior to visit.     No Known Allergies  ROS As per HPI  PE: Blood pressure 118/62, pulse 63, temperature 98.2 F (36.8 C), temperature source Oral, resp. rate 18, height _0  (1.778 m), weight 198 lb 6.4 oz (90 kg), SpO2 96 %. Body mass index is 28.47 kg/m.  Gen: Alert, well appearing.  Patient is oriented to person, place, time, and situation. AFFECT: pleasant, lucid thought and speech. CV: RRR with occ ectopy, no m/r/g.   LUNGS: CTA bilat, nonlabored resps, good aeration in all lung fields. EXT: no clubbing, cyanosis, or edema.    LABS:  Lab Results  Component Value Date   TSH 2.62 10/15/2016   Lab Results  Component Value Date   WBC 7.7 10/15/2016   HGB 13.6 10/15/2016   HCT 42.1 10/15/2016   MCV 87.6 10/15/2016   PLT 181.0 10/15/2016   Lab Results  Component Value Date   CREATININE 1.32 10/15/2016   BUN 35 (H) 10/15/2016   NA 137 10/15/2016   K 3.9 10/15/2016   CL 99 10/15/2016   CO2 28 10/15/2016   Lab Results  Component Value Date   ALT 16 10/15/2016   AST 20 10/15/2016   ALKPHOS 104 10/15/2016   BILITOT 0.7 10/15/2016   Lab Results  Component Value Date   CHOL 149 10/15/2016   Lab Results  Component Value Date   HDL 37.30 (L) 10/15/2016   Lab Results  Component Value Date   LDLCALC 81 10/15/2016   Lab Results  Component Value Date   TRIG 151.0 (H) 10/15/2016   Lab Results  Component Value Date   CHOLHDL 4 10/15/2016   Lab Results  Component Value Date    HGBA1C 6.1 04/22/2017   POC HbA1c 6.1% today  IMPRESSION AND PLAN:  1) DM 2 w/out complications: not eating diabetic diet, but A1c is excellent. Feet exam done today. He is reminded of need for diabetic retinopathy screening exam. Flu vaccine UTD.  2) HTN: encouraged to monitor bp at home occasionally. Recent Cr and lytes stable at nephrologist's 03/05/17.  3) HLD: tolerating statin.  Lipids good 10/15/16. Plan repeat FLP at next f/u in 3 mo.  4) CRI stage III, hx of renal transplant. Continue immunosuppression, avoidance of NSAIDs, and routine f/u with nephrology.  An After Visit Summary was  printed and given to the patient.  FOLLOW UP: Return in about 3 months (around 07/23/2017) for routine chronic illness f/u.  Signed:  Crissie Sickles, MD           04/22/2017

## 2017-04-22 NOTE — Patient Instructions (Addendum)
Pls make your next follow up appointment with me for 3 mo from now, morning appointment, and come fasting please.  Let us know about Shingles vaccine.   Continue doing brain stimulating activities (puzzles, reading, adult coloring books, staying active) to keep memory sharp.   Bring a copy of your living will and/or healthcare power of attorney to your next office visit.   Health Maintenance, Male A healthy lifestyle and preventive care is important for your health and wellness. Ask your health care provider about what schedule of regular examinations is right for you. What should I know about weight and diet? Eat a Healthy Diet  Eat plenty of vegetables, fruits, whole grains, low-fat dairy products, and lean protein.  Do not eat a lot of foods high in solid fats, added sugars, or salt.  Maintain a Healthy Weight Regular exercise can help you achieve or maintain a healthy weight. You should:  Do at least 150 minutes of exercise each week. The exercise should increase your heart rate and make you sweat (moderate-intensity exercise).  Do strength-training exercises at least twice a week.  Watch Your Levels of Cholesterol and Blood Lipids  Have your blood tested for lipids and cholesterol every 5 years starting at 73 years of age. If you are at high risk for heart disease, you should start having your blood tested when you are 73 years old. You may need to have your cholesterol levels checked more often if: ? Your lipid or cholesterol levels are high. ? You are older than 73 years of age. ? You are at high risk for heart disease.  What should I know about cancer screening? Many types of cancers can be detected early and may often be prevented. Lung Cancer  You should be screened every year for lung cancer if: ? You are a current smoker who has smoked for at least 30 years. ? You are a former smoker who has quit within the past 15 years.  Talk to your health care provider about  your screening options, when you should start screening, and how often you should be screened.  Colorectal Cancer  Routine colorectal cancer screening usually begins at 73 years of age and should be repeated every 5-10 years until you are 73 years old. You may need to be screened more often if early forms of precancerous polyps or small growths are found. Your health care provider may recommend screening at an earlier age if you have risk factors for colon cancer.  Your health care provider may recommend using home test kits to check for hidden blood in the stool.  A small camera at the end of a tube can be used to examine your colon (sigmoidoscopy or colonoscopy). This checks for the earliest forms of colorectal cancer.  Prostate and Testicular Cancer  Depending on your age and overall health, your health care provider may do certain tests to screen for prostate and testicular cancer.  Talk to your health care provider about any symptoms or concerns you have about testicular or prostate cancer.  Skin Cancer  Check your skin from head to toe regularly.  Tell your health care provider about any new moles or changes in moles, especially if: ? There is a change in a mole's size, shape, or color. ? You have a mole that is larger than a pencil eraser.  Always use sunscreen. Apply sunscreen liberally and repeat throughout the day.  Protect yourself by wearing long sleeves, pants, a wide-brimmed hat, and sunglasses  when outside.  What should I know about heart disease, diabetes, and high blood pressure?  If you are 62-70 years of age, have your blood pressure checked every 3-5 years. If you are 19 years of age or older, have your blood pressure checked every year. You should have your blood pressure measured twice-once when you are at a hospital or clinic, and once when you are not at a hospital or clinic. Record the average of the two measurements. To check your blood pressure when you are  not at a hospital or clinic, you can use: ? An automated blood pressure machine at a pharmacy. ? A home blood pressure monitor.  Talk to your health care provider about your target blood pressure.  If you are between 9-73 years old, ask your health care provider if you should take aspirin to prevent heart disease.  Have regular diabetes screenings by checking your fasting blood sugar level. ? If you are at a normal weight and have a low risk for diabetes, have this test once every three years after the age of 14. ? If you are overweight and have a high risk for diabetes, consider being tested at a younger age or more often.  A one-time screening for abdominal aortic aneurysm (AAA) by ultrasound is recommended for men aged 24-75 years who are current or former smokers. What should I know about preventing infection? Hepatitis B If you have a higher risk for hepatitis B, you should be screened for this virus. Talk with your health care provider to find out if you are at risk for hepatitis B infection. Hepatitis C Blood testing is recommended for:  Everyone born from [birth year] through 1967.  Anyone with known risk factors for hepatitis C.  Sexually Transmitted Diseases (STDs)  You should be screened each year for STDs including gonorrhea and chlamydia if: ? You are sexually active and are younger than 73 years of age. ? You are older than 73 years of age and your health care provider tells you that you are at risk for this type of infection. ? Your sexual activity has changed since you were last screened and you are at an increased risk for chlamydia or gonorrhea. Ask your health care provider if you are at risk.  Talk with your health care provider about whether you are at high risk of being infected with HIV. Your health care provider may recommend a prescription medicine to help prevent HIV infection.  What else can I do?  Schedule regular health, dental, and eye exams.  Stay current  with your vaccines (immunizations).  Do not use any tobacco products, such as cigarettes, chewing tobacco, and e-cigarettes. If you need help quitting, ask your health care provider.  Limit alcohol intake to no more than 2 drinks per day. One drink equals 12 ounces of beer, 5 ounces of wine, or 1 ounces of hard liquor.  Do not use street drugs.  Do not share needles.  Ask your health care provider for help if you need support or information about quitting drugs.  Tell your health care provider if you often feel depressed.  Tell your health care provider if you have ever been abused or do not feel safe at home. This information is not intended to replace advice given to you by your health care provider. Make sure you discuss any questions you have with your health care provider. Document Released: 12/01/2007 Document Revised: 02/01/2016 Document Reviewed: 03/08/2015 Elsevier Interactive Patient Education  2018 [employer]  

## 2017-04-22 NOTE — Progress Notes (Signed)
Subjective:   John Barton is a 71 y.o. male who presents for Medicare Annual/Subsequent preventive examination.  Works part time [employer] [place]   Review of Systems:  No ROS.  Medicare Wellness Visit. Additional risk factors are reflected in the social history.  Cardiac Risk Factors include: advanced age (>56mn, >>60women);dyslipidemia;male gender;hypertension;family history of premature cardiovascular disease   Sleep patterns: Sleeps 7 hours.  Home Safety/Smoke Alarms: Feels safe in home. Smoke alarms in place.  Living environment; residence and Firearm Safety: Lives alone with cat in 1 story home. Daughter lives close.  Seat Belt Safety/Bike Helmet: Wears seat belt.    Male:   CCS-Colonoscopy 10/05/2015, normal.     PSA- No results found for: PSA      Objective:    Vitals: BP 118/62 (BP Location: Right Arm, Patient Position: Sitting, Cuff Size: Normal)   Pulse 63   Temp 98.2 F (36.8 C) (Oral)   Resp 18   Ht _0  (1.778 m)   Wt 198 lb 6.4 oz (90 kg)   SpO2 96%   BMI 28.47 kg/m   Body mass index is 28.47 kg/m.  Tobacco Social History   Tobacco Use  Smoking Status Former Smoker  . Types: Cigars  . Last attempt to quit: 05/29/2014  . Years since quitting: 2.9  Smokeless Tobacco Never Used  Tobacco Comment   quit cigarettes '06     Counseling given: Not Answered Comment: quit cigarettes '06   Past Medical History:  Diagnosis Date  . Arthritis   . Blood transfusion without reported diagnosis   . Carotid stenosis   . 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 DJimmy Footman  GFR stable at 52 ml/min (Cr 1.36) 03/05/17.  .Marland KitchenCOPD (chronic obstructive pulmonary disease) ([place]   . Coronary artery disease    CABG 1995.  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 Diabetes mellitus without complication ([place] 097/9892  New dx 10/15/16 by A1c criteria (6.6%)--recommended  nutritionist referral/no meds at initial dx.  . Glaucoma   . Gout    H/O  . H/O paroxysmal supraventricular tachycardia    with wide complex. + Hx of a-fib as well.  . Hyperlipidemia   . Hypertension   . ICD (implantable cardioverter-defibrillator) in place    Normal device check 01/28/16.  . Ischemic cardiomyopathy    Ischemic heart disease and mild depressed LV and RV function  . Post-transplant erythrocytosis   . 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 Secondary hyperparathyroidism ([place]    parathyroidectomy done; subsequently has hypoparathyroidism  . Sinus bradycardia   . Syncope    He is s/p loop recorder insertion; as of Dr. KOlin Piaf/u 01/16/16 he has had no further syncope and no significant arrhythmias.  He elected to keep the loop recorder in and was told to f/u 1 yr with Dr. kCaryl Comes   NORMAL DEVICE REMOTE TRANSMISSION--NO ATRIAL FIBRILLATION--as of 09/02/15.  . Trifascicular block    First degree AV block, right bundle branch block left anterior fascicular block   Past Surgical History:  Procedure Laterality Date  . AORTIC VALVE REPLACEMENT  1995   ST.Jude mechanical valve  . CARDIAC CATHETERIZATION  1995  . CORONARY ARTERY BYPASS  GRAFT  1995   RCA andLAD  . 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 parathyroidectomy    . TRANSTHORACIC ECHOCARDIOGRAM  08/15/12; 08/03/16   2014: Mild LVH and LV dilation, EF 50-55%, normal aortic valve gradients.  2018: EF 45-50%, normal functioning prosthetic AV, diffuse hypokinesis, grd I DD, mild mitral stenosis   Family History  Problem Relation Age of Onset  . Heart failure Father   . Heart disease Father   . Hyperlipidemia Father   . Hypertension Father   .  Diabetes Father   . Arthritis Mother   . Heart disease Mother   . Hyperlipidemia Mother   . Hypertension Mother   . Heart failure Mother   . Heart disease Brother   . Diabetes Daughter   . Alcohol abuse Brother   . Liver disease Brother        Liver transplant, ETOH   Social History   Substance and Sexual Activity  Sexual Activity Not Currently    Outpatient Encounter Medications as of 04/22/2017  Medication Sig  . allopurinol (ZYLOPRIM) 100 MG tablet 1 tablet by mouth in the morning 2 tablets by mouth at night  . atorvastatin (LIPITOR) 80 MG tablet Take 1 tablet (80 mg total) by mouth daily.  . calcitRIOL (ROCALTROL) 0.5 MCG capsule Take 0.5 mcg by mouth daily.  . furosemide (LASIX) 80 MG tablet Take 80 mg by mouth daily.  Marland Kitchen lisinopril (PRINIVIL,ZESTRIL) 5 MG tablet Take 5 mg by mouth daily.  . Multiple Vitamins-Minerals (OCUVITE PO) Take 1 tablet by mouth daily.   . mycophenolate (CELLCEPT) 500 MG tablet Take 1,000 mg by mouth 2 (two) times daily.   . Naproxen Sodium (ALEVE PO) Take 1 tablet by mouth daily as needed (pain).  . tamsulosin (FLOMAX) 0.4 MG CAPS capsule Take 0.4 mg by mouth daily.   Marland Kitchen warfarin (COUMADIN) 2 MG tablet Take 1 mg by mouth. Takes Sunday, Tuesday, Thursday, Saturday  . warfarin (COUMADIN) 5 MG tablet Take 1.5-2 tablets (7.5-10 mg total) by mouth daily. 31m-Saturday and Sunday 7.568mAll other days (Patient taking differently: Take 5 mg by mouth daily. Monday, Wednesday and Friday)   No facility-administered encounter medications on file as of 04/22/2017.     Activities of Daily Living In your present state of health, do you have any difficulty performing the following activities: 04/22/2017  Hearing? N  Vision? N  Difficulty concentrating or making decisions? N  Walking or climbing stairs? N  Dressing or bathing? N  Doing errands, shopping? N  Preparing Food and eating ? N  Using the Toilet? N  In the past six months, have you accidently leaked  urine? N  Do you have problems with loss of bowel control? N  Managing your Medications? N  Managing your Finances? N  Housekeeping or managing your Housekeeping? N  Some recent data might be hidden    Patient Care Team: McTammi SouMD as PCP - General (Family Medicine) KlDeboraha SprangMD as Consulting Physician (Cardiology) Deterding, JaJeneen RinksMD as Consulting Physician (Nephrology) GeBurtis JunesNP as Nurse Practitioner (Nurse Practitioner)   Assessment:    Physical assessment deferred to PCP.  Exercise Activities and Dietary recommendations Current Exercise Habits: The patient does not participate in regular exercise at present, Exercise limited by: None identified   Diet (meal preparation, eat out, water intake, caffeinated beverages, dairy products, fruits and vegetables): Drinks coffee, water and  tea.   Breakfast: [place] sausage, toast, coffee Lunch: pintos, rice, meatloaf, vegetables Dinner:  Protein and vegetables.    Goals    None     Fall Risk Fall Risk  04/22/2017 04/09/2016  Falls in the past year? No No   Depression Screen PHQ 2/9 Scores 04/22/2017 04/09/2016  PHQ - 2 Score 0 0    Cognitive Function MMSE - Mini Mental State Exam 04/22/2017 04/09/2016  Orientation to time 5 5  Orientation to Place 5 5  Registration 3 3  Attention/ Calculation 5 0  Recall 3 3  Language- name 2 objects 2 2  Language- repeat 1 1  Language- follow 3 step command 3 3  Language- read & follow direction 1 1  Write a sentence 1 1  Copy design 1 1  Total score 30 25        Immunization History  Administered Date(s) Administered  . Influenza, High Dose Seasonal PF 04/04/2015, 04/17/2016  . Pneumococcal Conjugate-13 08/11/2014  . Pneumococcal Polysaccharide-23 10/15/2016   Screening Tests Health Maintenance  Topic Date Due  . FOOT EXAM  03/24/1956  . OPHTHALMOLOGY EXAM  [birth year]24/1954  . HEMOGLOBIN A1C  04/18/2017  . Hepatitis C Screening  04/19/2019  (Originally 1945/09/01)  . TETANUS/TDAP  10/04/2020  . COLONOSCOPY  10/04/2025  . INFLUENZA VACCINE  Completed  . PNA vac Low Risk Adult  Completed   Diabetic Foot Exam - Simple   Simple Foot Form Diabetic Foot exam was performed with the following findings:  Yes 04/22/2017  1:30 PM  Visual Inspection No deformities, no ulcerations, no other skin breakdown bilaterally:  Yes Sensation Testing Intact to touch and monofilament testing bilaterally:  Yes Pulse Check Posterior Tibialis and Dorsalis pulse intact bilaterally:  Yes Comments        Plan:     Let us know about Shingles vaccine.   Continue doing brain stimulating activities (puzzles, reading, adult coloring books, staying active) to keep memory sharp.   Bring a copy of your living will and/or healthcare power of attorney to your next office visit.  I have personally reviewed and noted the following in the patient's chart:   . Medical and social history . Use of alcohol, tobacco or illicit drugs  . Current medications and supplements . Functional ability and status . Nutritional status . Physical activity . Advanced directives . List of other physicians . Hospitalizations, surgeries, and ER visits in previous 12 months . Vitals . Screenings to include cognitive, depression, and falls . Referrals and appointments  In addition, I have reviewed and discussed with patient certain preventive protocols, quality metrics, and best practice recommendations. A written personalized care plan for preventive services as well as general preventive health recommendations were provided to patient.     Gerilyn Nestle, RN  04/22/2017

## 2017-04-23 DIAGNOSIS — Z7901 Long term (current) use of anticoagulants: Secondary | ICD-10-CM | POA: Diagnosis not present

## 2017-05-13 DIAGNOSIS — Z7901 Long term (current) use of anticoagulants: Secondary | ICD-10-CM | POA: Diagnosis not present

## 2017-05-28 DIAGNOSIS — Z7901 Long term (current) use of anticoagulants: Secondary | ICD-10-CM | POA: Diagnosis not present

## 2017-06-03 ENCOUNTER — Ambulatory Visit: Payer: Self-pay | Admitting: Nurse Practitioner

## 2017-06-03 DIAGNOSIS — H353132 Nonexudative age-related macular degeneration, bilateral, intermediate dry stage: Secondary | ICD-10-CM | POA: Diagnosis not present

## 2017-06-09 NOTE — Progress Notes (Signed)
AWV reviewed and agree.  Signed:  Crissie Sickles, MD           06/09/2017

## 2017-06-28 DIAGNOSIS — Z7901 Long term (current) use of anticoagulants: Secondary | ICD-10-CM | POA: Diagnosis not present

## 2017-07-08 ENCOUNTER — Encounter (INDEPENDENT_AMBULATORY_CARE_PROVIDER_SITE_OTHER): Payer: Self-pay

## 2017-07-08 ENCOUNTER — Ambulatory Visit: Payer: Medicare Other | Admitting: Nurse Practitioner

## 2017-07-08 ENCOUNTER — Encounter: Payer: Self-pay | Admitting: Nurse Practitioner

## 2017-07-08 VITALS — BP 130/70 | HR 63 | Ht 70.5 in | Wt 202.8 lb

## 2017-07-08 DIAGNOSIS — I452 Bifascicular block: Secondary | ICD-10-CM | POA: Diagnosis not present

## 2017-07-08 NOTE — Progress Notes (Signed)
CARDIOLOGY OFFICE NOTE  Date:  07/08/2017    [name] Date of Birth: 02-29-1946 Medical Record #[id number]  PCP:  Tammi Sou, MD  Cardiologist:  Ree Shay    Chief Complaint  Patient presents with  . Coronary Artery Disease    Follow up visit - seen for Dr. Caryl Comes    History of Present Illness: John Barton is a 72 y.o. male who presents today for a one year visit. Seen for Dr. Caryl Comes. He is a former patient of Dr. Sherryl Barters.   He has a complex past medical history. He has known CAD with remote CABG to the RCA and LAD as well as prior St. Jude AVR in 1995. He had SVT in 2010. For reasons that are not particularly clear, he ended up on Quinag[place]ute - no longer on. He had had a loop in place for syncope - it reached ERI - was elected to leave in. Other issues include prior kidney transplant - sees Dr. Jimmy Footman. Has chronic bifascicular block.    Seen back in July of 2017 by Dr. Caryl Comes and noted device was at [facility] - he elected to leave it in - does not look like it was replaced. Otherwise felt to be doing ok.  No recurrence of syncope.   I then saw him in January of 2018 after about a 4 year absence. He was doing ok. Labs followed by Renal and primary care. Not smoking.   Comes in today. Here alone. He continues to do well. Seeing Dr. Jimmy Footman later this month with labs. No chest pain. Breathing is good. Not dizzy or lightheaded. No recurrent syncope. Still not interested in having his loop taken out. No bleeding/excessive bruising noted. No problems with the coumadin. He is pretty happy with how he is doing. Enjoying his motorcycle. Tolerating his medicines. Echo updated last year and was stable - has mild LV dysfunction - not symptomatic - on ACE - no beta blocker due to bifascicular block. Wears dentures and does not go to the dentist.   Past Medical History:  Diagnosis Date  . Arthritis   . Blood transfusion without reported diagnosis   . Carotid  stenosis   . 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 Jimmy Footman.  GFR stable at 52 ml/min (Cr 1.36) 03/05/17.  Marland Kitchen COPD (chronic obstructive pulmonary disease) ([place])   . Coronary artery disease    CABG 1995.  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 Diabetes mellitus without complication ([place]) 13/0865   New dx 10/15/16 by A1c criteria (6.6%)--recommended nutritionist referral/no meds at initial dx.  . Glaucoma   . Gout    H/O  . H/O paroxysmal supraventricular tachycardia    with wide complex. + Hx of a-fib as well.  . Hyperlipidemia   . Hypertension   . ICD (implantable cardioverter-defibrillator) in place    Normal device check 01/28/16.  . Ischemic cardiomyopathy    Ischemic heart disease and mild depressed LV and RV function  . Post-transplant erythrocytosis   . 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 Secondary hyperparathyroidism ([place])    parathyroidectomy done; subsequently has hypoparathyroidism  . Sinus bradycardia   . Syncope    He  is s/p loop recorder insertion; as of Dr. Olin Pia f/u 01/16/16 he has had no further syncope and no significant arrhythmias.  He elected to keep the loop recorder in and was told to f/u 1 yr with Dr. Caryl Comes.   NORMAL DEVICE REMOTE TRANSMISSION--NO ATRIAL FIBRILLATION--as of 09/02/15.  . Trifascicular block    First degree AV block, right bundle branch block left anterior fascicular block    Past Surgical History:  Procedure Laterality Date  . AORTIC VALVE REPLACEMENT  1995   ST.Jude mechanical valve  . CARDIAC CATHETERIZATION  1995  . CORONARY ARTERY BYPASS GRAFT  1995   RCA andLAD  . ELECTROPHYSIOLOGY STUDY N/A 09/11/2012   Procedure: ELECTROPHYSIOLOGY STUDY;  Surgeon: Deboraha Sprang,  MD;  Location: [facility] CATH LAB;  Service: Cardiovascular;  Laterality: N/A;  . 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 parathyroidectomy    . TRANSTHORACIC ECHOCARDIOGRAM  08/15/12; 08/03/16   2014: Mild LVH and LV dilation, EF 50-55%, normal aortic valve gradients.  2018: EF 45-50%, normal functioning prosthetic AV, diffuse hypokinesis, grd I DD, mild mitral stenosis     Medications: Current Meds  Medication Sig  . allopurinol (ZYLOPRIM) 100 MG tablet 1 tablet by mouth in the morning 2 tablets by mouth at night  . atorvastatin (LIPITOR) 80 MG tablet Take 1 tablet (80 mg total) by mouth daily.  . calcitRIOL (ROCALTROL) 0.5 MCG capsule Take 0.5 mcg by mouth daily.  . furosemide (LASIX) 80 MG tablet Take 80 mg by mouth daily.  Marland Kitchen lisinopril (PRINIVIL,ZESTRIL) 5 MG tablet Take 5 mg by mouth daily.  . Multiple Vitamins-Minerals (OCUVITE PO) Take 1 tablet by mouth daily.   . mycophenolate (CELLCEPT) 500 MG tablet Take 1,000 mg by mouth 2 (two) times daily.   . Naproxen Sodium (ALEVE PO) Take 1 tablet by mouth daily as needed (pain).  . tamsulosin (FLOMAX) 0.4 MG CAPS capsule Take 0.4 mg by mouth daily.   Marland Kitchen warfarin (COUMADIN) 2 MG tablet Take 2 mg by mouth. Takes Sunday, Tuesday, Thursday, Saturday      Allergies: No Known Allergies  Social History: The patient  reports that he quit smoking about 3 years ago. His smoking use included cigars. he has never used smokeless tobacco. He reports that he drinks about 8.4 oz of alcohol per week. He reports that he does not use drugs.   Family History: The patient's family history includes Alcohol abuse in his brother; Arthritis in his mother; Diabetes in his daughter and father; Heart disease in his brother, father, and mother; Heart failure in his father and mother; Hyperlipidemia in his father and mother; Hypertension in his father and mother; Liver disease  in his brother.   Review of Systems: Please see the history of present illness.   Otherwise, the review of systems is positive for none.   All other systems are reviewed and negative.   Physical Exam: VS:  BP 130/70 (BP Location: Right Arm, Patient Position: Sitting, Cuff Size: Normal)   Pulse 63   Ht 5' 10.5" (1.791 m)   Wt 202 lb 12.8 oz (92 kg)   BMI 28.69 kg/m  .  BMI Body mass index is 28.69 kg/m.  Wt Readings from Last 3 Encounters:  07/08/17 202 lb 12.8 oz (92 kg)  04/22/17 198 lb 6.4 oz (90 kg)  10/15/16 197 lb 12 oz (89.7 kg)    General: Pleasant. Well developed, well nourished  and in no acute distress.   HEENT: Normal.  Neck: Supple, no JVD, carotid bruits, or masses noted.  Cardiac: Regular rate and rhythm. No murmurs, rubs, or gallops. No edema.  Respiratory:  Lungs are clear to auscultation bilaterally with normal work of breathing.  GI: Soft and nontender.  MS: No deformity or atrophy. Gait and ROM intact.  Skin: Warm and dry. Color is normal.  Neuro:  Strength and sensation are intact and no gross focal deficits noted.  Psych: Alert, appropriate and with normal affect.   LABORATORY DATA:  EKG:  EKG is ordered today. This demonstrates sinus with bifascicular block.  Lab Results  Component Value Date   WBC 7.7 10/15/2016   HGB 13.6 10/15/2016   HCT 42.1 10/15/2016   PLT 181.0 10/15/2016   GLUCOSE 116 (H) 10/15/2016   CHOL 149 10/15/2016   TRIG 151.0 (H) 10/15/2016   HDL 37.30 (L) 10/15/2016   LDLCALC 81 10/15/2016   ALT 16 10/15/2016   AST 20 10/15/2016   NA 137 10/15/2016   K 3.9 10/15/2016   CL 99 10/15/2016   CREATININE 1.32 10/15/2016   BUN 35 (H) 10/15/2016   CO2 28 10/15/2016   TSH 2.62 10/15/2016   INR 4.7 (A) 09/11/2016   HGBA1C 6.1 04/22/2017         BNP (last 3 results) No results for input(s): BNP in the last 8760 hours.  ProBNP (last 3 results) No results for input(s): PROBNP in the last 8760 hours.   Other Studies  Reviewed Today:  Echo Study Conclusions 07/2016  - Left ventricle: The cavity size was normal. Wall thickness was   increased in a pattern of mild LVH. Systolic function was mildly   reduced. The estimated ejection fraction was in the range of 45%   to 50%. Diffuse hypokinesis. Doppler parameters are consistent   with abnormal left ventricular relaxation (grade 1 diastolic   dysfunction). Doppler parameters are consistent with high   ventricular filling pressure. - Aortic valve: A mechanical prosthesis was present. - Mitral valve: Severely calcified annulus. The findings are   consistent with mild stenosis. Valve area by pressure half-time:   1.58 cm^2. - Left atrium: The atrium was mildly dilated.  Impressions:  - Mild global reduction in LV systolic function; mild LVH; grade 1   diastolic dysfunction; elevated LV filling pressure; mild LAE;   s/p AVR with normal mean gradient and no AI; severe MAC with mild   MS and trace MR;    Myoview Impression from 2014 Exercise Capacity: Lexiscan with low level exercise. BP Response: Normal blood pressure response. Clinical Symptoms: There is dyspnea. ECG Impression: No significant ST segment change suggestive of ischemia. Comparison with Prior Nuclear Study: No images to compare  Overall Impression: Low risk stress nuclear study with a large, severe, partially reversible inferior defect consistent with prior inferior infarct and minimal peri-infarct ischemia.  LV Ejection Fraction: 53%. LV Wall Motion: Inferior hypokinesis.  Kirk Ruths   Assessment/Plan:  1. Syncope - has not recurred. He has chronic bifascicular block noted. No complaints of dizziness. No recurrent syncope. Will continue to follow.  2. History of Wide-complex tachycardia and sinus node dysfunction.   3. Underlying loop recorder - this has reached ERI - has been elected to leave in. Discussed again today - he does not wish to have it  removed.   4. Ischemic heart disease and depressed left ventricular function - mild LV dysfunction by last echo - seems compensated. Myoview from  2014 noted. He is doing well clinically. No symptoms of CHF. On ACE. No beta blocker due to bifascicular block.   5. Prior Aortic valve replacement - mechanical - on coumadin. Echo stable last year - will repeat in 2020.  6. History of kidney transplant - followed by nephrology.   7. Chronic coumadin therapy - monitored by nephrology. No obvious problems noted.   Current medicines are reviewed with the patient today.  The patient does not have concerns regarding medicines other than what has been noted above.  The following changes have been made:  See above.  Labs/ tests ordered today include:    Orders Placed This Encounter  Procedures  . EKG 12-Lead     Disposition:   FU with me in 6 months.   Patient is agreeable to this plan and will call if any problems develop in the interim.   SignedTruitt Merle, NP  07/08/2017 9:32 AM  [facility] [address] [place], [place]  [address] Phone: [phone] Fax: [phone]

## 2017-07-08 NOTE — Patient Instructions (Signed)
We will be checking the following labs today - NONE   Medication Instructions:    Continue with your current medicines.     Testing/Procedures To Be Arranged:  N/A  Follow-Up:   See me in 6 months.    Other Special Instructions:   N/A    If you need a refill on your cardiac medications before your next appointment, please call your pharmacy.   Call the [facility] office at [phone] if you have any questions, problems or concerns.      

## 2017-07-09 DIAGNOSIS — Z7901 Long term (current) use of anticoagulants: Secondary | ICD-10-CM | POA: Diagnosis not present

## 2017-07-09 DIAGNOSIS — N183 Chronic kidney disease, stage 3 (moderate): Secondary | ICD-10-CM | POA: Diagnosis not present

## 2017-07-09 DIAGNOSIS — I129 Hypertensive chronic kidney disease with stage 1 through stage 4 chronic kidney disease, or unspecified chronic kidney disease: Secondary | ICD-10-CM | POA: Diagnosis not present

## 2017-07-09 DIAGNOSIS — M1A30X0 Chronic gout due to renal impairment, unspecified site, without tophus (tophi): Secondary | ICD-10-CM | POA: Diagnosis not present

## 2017-07-09 DIAGNOSIS — Z940 Kidney transplant status: Secondary | ICD-10-CM | POA: Diagnosis not present

## 2017-07-09 DIAGNOSIS — N2581 Secondary hyperparathyroidism of renal origin: Secondary | ICD-10-CM | POA: Diagnosis not present

## 2017-07-09 LAB — HEPATIC FUNCTION PANEL
ALK PHOS: 113 (ref 25–125)
ALT: 17 (ref 10–40)
AST: 22 (ref 14–40)
Bilirubin, Total: 0.4

## 2017-07-09 LAB — BASIC METABOLIC PANEL
BUN: 26 — AB (ref 4–21)
CREATININE: 1.3 (ref <=1.3)
Glucose: 111
Potassium: 4.6 (ref 3.4–5.3)
Sodium: 138 (ref 137–147)

## 2017-07-09 LAB — CBC AND DIFFERENTIAL
HCT: 40 — AB (ref 41–53)
HEMOGLOBIN: 13.7 (ref 13.5–17.5)
NEUTROS ABS: 4
PLATELETS: 169 (ref 150–399)
WBC: 5.9

## 2017-07-15 ENCOUNTER — Encounter: Payer: Self-pay | Admitting: Family Medicine

## 2017-07-17 ENCOUNTER — Ambulatory Visit: Payer: Self-pay | Admitting: Nurse Practitioner

## 2017-07-18 ENCOUNTER — Encounter: Payer: Self-pay | Admitting: Family Medicine

## 2017-07-19 HISTORY — PX: OTHER SURGICAL HISTORY: SHX169

## 2017-07-25 DIAGNOSIS — Z7901 Long term (current) use of anticoagulants: Secondary | ICD-10-CM | POA: Diagnosis not present

## 2017-07-29 ENCOUNTER — Encounter: Payer: Self-pay | Admitting: Family Medicine

## 2017-08-08 DIAGNOSIS — N2581 Secondary hyperparathyroidism of renal origin: Secondary | ICD-10-CM | POA: Diagnosis not present

## 2017-08-08 DIAGNOSIS — Z940 Kidney transplant status: Secondary | ICD-10-CM | POA: Diagnosis not present

## 2017-08-08 DIAGNOSIS — N183 Chronic kidney disease, stage 3 (moderate): Secondary | ICD-10-CM | POA: Diagnosis not present

## 2017-08-08 DIAGNOSIS — Z7901 Long term (current) use of anticoagulants: Secondary | ICD-10-CM | POA: Diagnosis not present

## 2017-08-08 DIAGNOSIS — I129 Hypertensive chronic kidney disease with stage 1 through stage 4 chronic kidney disease, or unspecified chronic kidney disease: Secondary | ICD-10-CM | POA: Diagnosis not present

## 2017-08-08 DIAGNOSIS — M1A30X0 Chronic gout due to renal impairment, unspecified site, without tophus (tophi): Secondary | ICD-10-CM | POA: Diagnosis not present

## 2017-08-14 ENCOUNTER — Telehealth: Payer: Self-pay | Admitting: Nurse Practitioner

## 2017-08-14 NOTE — Telephone Encounter (Signed)
New Message   STAT if HR is under 50 or over 120 (normal HR is 60-100 beats per minute)  1) What is your heart rate? 42   2) Do you have a log of your heart rate readings (document readings)? No   3) Do you have any other symptoms? Shortness of breath. BP 165/95 pulse rate 98

## 2017-08-14 NOTE — Telephone Encounter (Signed)
Patient's daughter called, pt c/o fatigue, sob and low HR in the 40s, BP 173/90, and O2 98% on room air. Patient is not on a BB. Reviewed with Truitt Merle, NP. Per Cecille Rubin have patient follow up tomorrow with an extender, patient is scheduled with Vin, PA on 08/15/17. Informed her that if BP remains elevated and the patient c/o of headache, blurred vision, weakness or feels like he needs to be elevated to go to the ER to be evaluated. She verbalized understanding and thanked me for the call.

## 2017-08-15 ENCOUNTER — Encounter (INDEPENDENT_AMBULATORY_CARE_PROVIDER_SITE_OTHER): Payer: Self-pay

## 2017-08-15 ENCOUNTER — Encounter: Payer: Self-pay | Admitting: Physician Assistant

## 2017-08-15 ENCOUNTER — Ambulatory Visit: Payer: Medicare Other | Admitting: Physician Assistant

## 2017-08-15 VITALS — BP 128/88 | HR 79 | Ht 70.0 in | Wt 193.0 lb

## 2017-08-15 DIAGNOSIS — I452 Bifascicular block: Secondary | ICD-10-CM | POA: Diagnosis not present

## 2017-08-15 DIAGNOSIS — I259 Chronic ischemic heart disease, unspecified: Secondary | ICD-10-CM

## 2017-08-15 DIAGNOSIS — R0602 Shortness of breath: Secondary | ICD-10-CM

## 2017-08-15 DIAGNOSIS — R5383 Other fatigue: Secondary | ICD-10-CM | POA: Diagnosis not present

## 2017-08-15 DIAGNOSIS — Z952 Presence of prosthetic heart valve: Secondary | ICD-10-CM | POA: Diagnosis not present

## 2017-08-15 LAB — CBC
HEMATOCRIT: 37.9 % (ref 37.5–51.0)
HEMOGLOBIN: 12.9 g/dL — AB (ref 13.0–17.7)
MCH: 29.1 pg (ref 26.6–33.0)
MCHC: 34 g/dL (ref 31.5–35.7)
MCV: 86 fL (ref 79–97)
Platelets: 206 10*3/uL (ref 150–379)
RBC: 4.43 x10E6/uL (ref 4.14–5.80)
RDW: 14.7 % (ref 12.3–15.4)
WBC: 7 10*3/uL (ref 3.4–10.8)

## 2017-08-15 LAB — BASIC METABOLIC PANEL
BUN/Creatinine Ratio: 24 (ref 10–24)
BUN: 49 mg/dL — AB (ref 8–27)
CO2: 19 mmol/L — AB (ref 20–29)
CREATININE: 2.01 mg/dL — AB (ref 0.76–1.27)
Calcium: 8.3 mg/dL — ABNORMAL LOW (ref 8.6–10.2)
Chloride: 100 mmol/L (ref 96–106)
GFR calc Af Amer: 37 mL/min/{1.73_m2} — ABNORMAL LOW (ref >59)
GFR calc non Af Amer: 32 mL/min/{1.73_m2} — ABNORMAL LOW (ref >59)
GLUCOSE: 116 mg/dL — AB (ref 65–99)
Potassium: 4.9 mmol/L (ref 3.5–5.2)
Sodium: 136 mmol/L (ref 134–144)

## 2017-08-15 LAB — PRO B NATRIURETIC PEPTIDE: NT-PRO BNP: 2459 pg/mL — AB (ref 0–376)

## 2017-08-15 NOTE — Patient Instructions (Signed)
Medication Instructions:  1. Your physician recommends that you continue on your current medications as directed. Please refer to the Current Medication list given to you today.   Labwork: TODAY BMET, PRO BNP, CBC  Testing/Procedures: Your physician has recommended that you wear an event monitor. Event monitors are medical devices that record the heart's electrical activity. Doctors most often [place] these monitors to diagnose arrhythmias. Arrhythmias are problems with the speed or rhythm of the heartbeat. The monitor is a small, portable device. You can wear one while you do your normal daily activities. This is usually used to diagnose what is causing palpitations/syncope (passing out).    Follow-Up: DR. Caryl Comes IN 2 MONTHS   Any Other Special Instructions Will Be Listed Below (If Applicable).     If you need a refill on your cardiac medications before your next appointment, please call your pharmacy.

## 2017-08-15 NOTE — Progress Notes (Signed)
Cardiology Office Note    Date:  08/15/2017   ID:  John Barton, DOB 11-23-1945, MRN [id number]  PCP:  Tammi Sou, MD  Cardiologist: Servando Snare & Dr. Caryl Comes   Chief Complaint: Low HR and elevated BP  History of Present Illness:   John Barton is a 72 y.o. male with complex PMH as below presented for evaluation of low HR and elevated BP.   He has known CAD with remote CABG to the RCA and LAD as well as prior St. Jude AVR in 1995. He had SVT in 2010.For reasons that are not particularly clear, he ended up on Quinaglute - no longer on.He had had a loop in place for syncope - it reached ERI - was elected to leave in. Other issues include prior kidney transplant with CKD stage III - sees Dr. Jimmy Footman. Has chronic bifascicular block.  Last echo 07/2016 showed LVEF of 45-50% (stable), diffuse hypokinesis, grade 1 DD,  s/p AVR with normal mean gradient and no AI; severe MAC with mild  MS and trace MR. Not symptomatic - on ACE - no beta blocker due to bifascicular block.  He was doing well on cardiac stand point when last seen by Truitt Merle 07/08/17.  He has noted episodic shortness of breath for past 2 weeks.  His episode last for 1-2 hours with self resolution.  He has associated fatigue.  He denies any palpitation.  4 days ago he woke up with vomiting and diarrhea.  Had chills at that time but no fever, cough or congestion.  Yesterday he had a worse episode of shortness of breath.  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  Has suddenly noted shortness of breath.  Per daughter patient did not look good.  His pulse oxygenation was 77% with a pulse of 40s.  Symptoms resolved spontaneously in 2 hours.  No recurrence.  He denies associated palpitation, skipped beat, chest pain or syncope.  He denies any orthopnea or PND.   Past Medical History:  Diagnosis Date  . Arthritis   . Blood transfusion without reported diagnosis   . Carotid stenosis   . 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 Jimmy Footman.  GFR stable at 55 ml/min (Cr 1.28) 07/16/17.  Marland Kitchen COPD (chronic obstructive pulmonary disease) ([place])   . Coronary artery disease    CABG 1995.  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 Diabetes mellitus without complication ([place]) 65/0354   New dx 10/15/16 by A1c criteria (6.6%)--recommended nutritionist referral/no meds at initial dx.  . Glaucoma   . Gout    H/O  . H/O paroxysmal supraventricular tachycardia    with wide complex. + Hx of a-fib as well.  . Hyperlipidemia   . Hypertension   . ICD (implantable cardioverter-defibrillator) in place    Normal device check 01/28/16.  . Ischemic cardiomyopathy    Ischemic heart disease and mild depressed LV and RV function  . Post-transplant erythrocytosis   . 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  //  Echo 2/18: mild LVH, EF 45-50, diff HK, Gr 1 DD, severe MAC, mild Mitral Stenosis, normally functioning mechanical AVR (mean 12, peak 25).  Per cards, repeat echo planned for 2020.  Marland Kitchen Secondary hyperparathyroidism ([place])    parathyroidectomy done; subsequently has hypoparathyroidism  . Sinus bradycardia   . Syncope    He is s/p loop recorder  insertion; as of Dr. Olin Pia f/u 01/16/16 he has had no further syncope and no significant arrhythmias.  He elected to keep the loop recorder in and was told to f/u 1 yr with Dr. Caryl Comes.   NORMAL DEVICE REMOTE TRANSMISSION--NO ATRIAL FIBRILLATION--as of 09/02/15.  . Trifascicular block    First degree AV block, right bundle branch block left anterior fascicular block    Past Surgical History:  Procedure Laterality Date  . AORTIC VALVE REPLACEMENT  1995   ST.Jude mechanical valve  . CARDIAC CATHETERIZATION  1995  . CORONARY ARTERY BYPASS GRAFT  1995   RCA andLAD  . ELECTROPHYSIOLOGY STUDY N/A 09/11/2012   Procedure: ELECTROPHYSIOLOGY STUDY;   Surgeon: Deboraha Sprang, MD;  Location[facility] CATH LAB;  Service: Cardiovascular;  Laterality: N/A;  . 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 parathyroidectomy    . TRANSTHORACIC ECHOCARDIOGRAM  08/15/12; 08/03/16   2014: Mild LVH and LV dilation, EF 50-55%, normal aortic valve gradients.  2018: EF 45-50%, normal functioning prosthetic AV, diffuse hypokinesis, grd I DD, mild mitral stenosis    Current Medications: Prior to Admission medications   Medication Sig Start Date End Date Taking? Authorizing Provider  allopurinol (ZYLOPRIM) 100 MG tablet 1 tablet by mouth in the morning 2 tablets by mouth at night    [provider]  atorvastatin (LIPITOR) 80 MG tablet Take 1 tablet (80 mg total) by mouth daily. 04/17/16   McGowen, [name], MD  calcitRIOL (ROCALTROL) 0.5 MCG capsule Take 0.5 mcg by mouth daily. 11/03/14   [provider]  furosemide (LASIX) 80 MG tablet Take 80 mg by mouth daily.    [provider]  lisinopril (PRINIVIL,ZESTRIL) 5 MG tablet Take 5 mg by mouth daily.    [provider]  Multiple Vitamins-Minerals (OCUVITE PO) Take 1 tablet by mouth daily.     [provider]  mycophenolate (CELLCEPT) 500 MG tablet Take 1,000 mg by mouth 2 (two) times daily.     [provider]  Naproxen Sodium (ALEVE PO) Take 1 tablet by mouth daily as needed (pain).    [provider]  tamsulosin (FLOMAX) 0.4 MG CAPS capsule Take 0.4 mg by mouth daily.  03/21/16   [provider]  warfarin (COUMADIN) 2 MG tablet Take 2 mg by mouth. Takes Sunday, Tuesday, Thursday, Saturday     [provider]    Allergies:   Patient has no known allergies.   Social History   Socioeconomic History  . Marital status: Divorced    Spouse name: None  . Number of children: 1  . Years of education: None  . Highest education level: None  Social  Needs  . Financial resource strain: None  . Food insecurity - worry: None  . Food insecurity - inability: None  . Transportation needs - medical: None  . Transportation needs - non-medical: None  Occupational History  . Occupation: retired  Tobacco Use  . Smoking status: Former Smoker    Types: Cigars    Last attempt to quit: 05/29/2014    Years since quitting: 3.2  . Smokeless tobacco: Never Used  . Tobacco comment: quit cigarettes '06  Substance and Sexual Activity  . Alcohol use: Yes    Alcohol/week: 8.4 oz    Types: 14 Cans of beer per week    Comment: 2 beers/daily  . Drug use: No  . Sexual activity: Not Currently  Other Topics Concern  . None  Social History Narrative   Mr. Hargens lives alone. He is retired. He has 1 grown daughter.     Family History:  The patient's family history includes Alcohol abuse in his brother; Arthritis in his mother; Diabetes in his daughter and father; Heart disease in his brother, father, and mother; Heart failure in his father and mother; Hyperlipidemia in his father and mother; Hypertension in his father and mother; Liver disease in his brother.   ROS:   Please see the history of present illness.    ROS All other systems reviewed and are negative.   PHYSICAL EXAM:   VS:  BP 128/88   Pulse 79   Ht 5' 10"  (1.778 m)   Wt 193 lb (87.5 kg)   SpO2 96%   BMI 27.69 kg/m    GEN: Well nourished, well developed, in no acute distress  HEENT: normal  Neck: no JVD, carotid bruits, or masses Cardiac: RRR; no murmurs, rubs, or gallops,no edema  Respiratory:  clear to auscultation bilaterally, normal work of breathing GI: soft, nontender, + distended, + BS MS: no deformity or atrophy  Skin: warm and dry, no rash Neuro:  Alert and Oriented x 3, Strength and sensation are intact Psych: euthymic mood, full affect  Wt Readings from Last 3 Encounters:  08/15/17 193 lb (87.5 kg)  07/08/17 202 lb 12.8 oz (92 kg)  04/22/17 198 lb 6.4 oz (90 kg)       Studies/Labs Reviewed:   EKG:  EKG is ordered today.  The ekg ordered today demonstrates normal sinus rhythm at rate of 79 bpm, PVC and chronic bifascicular block  Recent Labs: 10/15/2016: TSH 2.62 07/09/2017: ALT 17; BUN 26; Creatinine 1.3; Hemoglobin 13.7; Platelets 169; Potassium 4.6; Sodium 138   Lipid Panel    Component Value Date/Time   CHOL 149 10/15/2016 0858   TRIG 151.0 (H) 10/15/2016 0858   HDL 37.30 (L) 10/15/2016 0858   CHOLHDL 4 10/15/2016 0858   VLDL 30.2 10/15/2016 0858   LDLCALC 81 10/15/2016 0858    Additional studies/ records that were reviewed today include:   Echo Study Conclusions 07/2016  - Left ventricle: The cavity size was normal. Wall thickness was increased in a pattern of mild LVH. Systolic function was mildly reduced. The estimated ejection fraction was in the range of 45% to 50%. Diffuse hypokinesis. Doppler parameters are consistent with abnormal left ventricular relaxation (grade 1 diastolic dysfunction). Doppler parameters are consistent with high ventricular filling pressure. - Aortic valve: A mechanical prosthesis was present. - Mitral valve: Severely calcified annulus. The findings are consistent with mild stenosis. Valve area by pressure half-time: 1.58 cm^2. - Left atrium: The atrium was mildly dilated.  Impressions:  - Mild global reduction in LV systolic function; mild LVH; grade 1 diastolic dysfunction; elevated LV filling pressure; mild LAE; s/p AVR with normal mean gradient and no AI; severe MAC with mild MS and trace MR;    MyoviewImpressionfrom 2014 Exercise Capacity: Lexiscan with low level exercise. BP Response: Normal blood pressure response. Clinical Symptoms: There is dyspnea. ECG Impression: No significant ST segment change suggestive of ischemia. Comparison with Prior Nuclear Study: No images to compare  Overall Impression: Low risk stress nuclear study with a large, severe,  partially reversible inferior defect consistent with prior inferior infarct and minimal peri-infarct ischemia.  LV Ejection Fraction: 53%. LV Wall Motion: Inferior hypokinesis.  Kirk Ruths    ASSESSMENT & PLAN:    1.  low  pulse/bifascicular block  -Patient has a chronic history of bifascicular block with prior loop recorder placement (reached ERI - was elected to leave in).  Noted pulse of 40s persistently for 2 hours yesterday.  He felt severe fatigue, dizzy and dyspneic. -Her heart rate is stable by EKG today.  He is feeling better. -Will get 30-day monitor to rule out any significant bradycardia.  He is not on any AV blocking agent.  2.  Shortness of breath/mild LV dysfunction  -Ongoing for past few weeks.  He denies any orthopnea or PND.  However noted abdominal tightness by exam.  No lower extremity edema.  Lungs clear.  No significant murmur heard.  Will check electrolyte, CBC and BNP. -His oxygen saturation is in 92% with ambulation during this visit.  Ongoing symptoms with the hearing above workup consider echocardiogram.  He denies any chest tightness or heaviness.  History of mechanical valve. - Continue ACE inhibitor.  No beta-blocker due to bifascicular block.  Diuretics followed by nephrologist.  3. S/p St. Jude AVR in 1995. - last echo showed normal mean gradient and no AI. As above. Continue coumadin.   4.   History of kidney transplant -Followed by Dr. Aurora Mask was present during office visit.  Encouraged ER evaluation for worsening episode.  She is agreeable with plan.  Medication Adjustments/Labs and Tests Ordered: Current medicines are reviewed at length with the patient today.  Concerns regarding medicines are outlined above.  Medication changes, Labs and Tests ordered today are listed in the Patient Instructions below. Patient Instructions  Medication Instructions:  1. Your physician recommends that you continue on your current medications as  directed. Please refer to the Current Medication list given to you today.   Labwork: TODAY BMET, PRO BNP, CBC  Testing/Procedures: Your physician has recommended that you wear an event monitor. Event monitors are medical devices that record the heart's electrical activity. Doctors most often [place] these monitors to diagnose arrhythmias. Arrhythmias are problems with the speed or rhythm of the heartbeat. The monitor is a small, portable device. You can wear one while you do your normal daily activities. This is usually used to diagnose what is causing palpitations/syncope (passing out).    Follow-Up: DR. Caryl Comes IN 2 MONTHS   Any Other Special Instructions Will Be Listed Below (If Applicable).     If you need a refill on your cardiac medications before your next appointment, please call your pharmacy.      Jarrett Soho, [place]  08/15/2017 10:26 AM    [address], [place], [place]  [address] Phone: [phone]; Fax: [phone]

## 2017-08-18 ENCOUNTER — Encounter: Payer: Self-pay | Admitting: Family Medicine

## 2017-08-19 ENCOUNTER — Encounter: Payer: Self-pay | Admitting: Family Medicine

## 2017-08-21 DIAGNOSIS — N2581 Secondary hyperparathyroidism of renal origin: Secondary | ICD-10-CM | POA: Diagnosis not present

## 2017-08-21 DIAGNOSIS — N183 Chronic kidney disease, stage 3 (moderate): Secondary | ICD-10-CM | POA: Diagnosis not present

## 2017-08-21 DIAGNOSIS — Z940 Kidney transplant status: Secondary | ICD-10-CM | POA: Diagnosis not present

## 2017-08-21 DIAGNOSIS — Z7901 Long term (current) use of anticoagulants: Secondary | ICD-10-CM | POA: Diagnosis not present

## 2017-08-21 DIAGNOSIS — I129 Hypertensive chronic kidney disease with stage 1 through stage 4 chronic kidney disease, or unspecified chronic kidney disease: Secondary | ICD-10-CM | POA: Diagnosis not present

## 2017-08-21 DIAGNOSIS — M1A30X0 Chronic gout due to renal impairment, unspecified site, without tophus (tophi): Secondary | ICD-10-CM | POA: Diagnosis not present

## 2017-08-27 ENCOUNTER — Other Ambulatory Visit: Payer: Self-pay | Admitting: Physician Assistant

## 2017-08-27 ENCOUNTER — Ambulatory Visit (INDEPENDENT_AMBULATORY_CARE_PROVIDER_SITE_OTHER): Payer: Medicare Other

## 2017-08-27 DIAGNOSIS — I452 Bifascicular block: Secondary | ICD-10-CM

## 2017-08-27 DIAGNOSIS — R42 Dizziness and giddiness: Secondary | ICD-10-CM

## 2017-08-27 DIAGNOSIS — R0602 Shortness of breath: Secondary | ICD-10-CM

## 2017-08-27 DIAGNOSIS — R001 Bradycardia, unspecified: Secondary | ICD-10-CM

## 2017-09-02 DIAGNOSIS — Z7901 Long term (current) use of anticoagulants: Secondary | ICD-10-CM | POA: Diagnosis not present

## 2017-09-04 DIAGNOSIS — R9431 Abnormal electrocardiogram [ECG] [EKG]: Secondary | ICD-10-CM | POA: Diagnosis not present

## 2017-09-09 ENCOUNTER — Encounter: Payer: Self-pay | Admitting: Family Medicine

## 2017-09-09 DIAGNOSIS — Z7901 Long term (current) use of anticoagulants: Secondary | ICD-10-CM | POA: Diagnosis not present

## 2017-09-23 DIAGNOSIS — Z7901 Long term (current) use of anticoagulants: Secondary | ICD-10-CM | POA: Diagnosis not present

## 2017-10-01 DIAGNOSIS — E785 Hyperlipidemia, unspecified: Secondary | ICD-10-CM | POA: Diagnosis not present

## 2017-10-01 DIAGNOSIS — N2581 Secondary hyperparathyroidism of renal origin: Secondary | ICD-10-CM | POA: Diagnosis not present

## 2017-10-01 DIAGNOSIS — Z7901 Long term (current) use of anticoagulants: Secondary | ICD-10-CM | POA: Diagnosis not present

## 2017-10-01 DIAGNOSIS — Z940 Kidney transplant status: Secondary | ICD-10-CM | POA: Diagnosis not present

## 2017-10-01 DIAGNOSIS — M1A30X0 Chronic gout due to renal impairment, unspecified site, without tophus (tophi): Secondary | ICD-10-CM | POA: Diagnosis not present

## 2017-10-01 DIAGNOSIS — N183 Chronic kidney disease, stage 3 (moderate): Secondary | ICD-10-CM | POA: Diagnosis not present

## 2017-10-01 DIAGNOSIS — I129 Hypertensive chronic kidney disease with stage 1 through stage 4 chronic kidney disease, or unspecified chronic kidney disease: Secondary | ICD-10-CM | POA: Diagnosis not present

## 2017-10-01 LAB — BASIC METABOLIC PANEL
BUN: 25 — AB (ref 4–21)
Creatinine: 1.3 (ref 0.6–1.3)
GLUCOSE: 114
POTASSIUM: 4.7 (ref 3.4–5.3)
Sodium: 139 (ref 137–147)

## 2017-10-01 LAB — HEPATIC FUNCTION PANEL
ALT: 17 (ref 10–40)
AST: 20 (ref 14–40)
Alkaline Phosphatase: 111 (ref 25–125)
Bilirubin, Total: 0.5

## 2017-10-01 LAB — LIPID PANEL
CHOLESTEROL: 143 (ref 0–200)
HDL: 41 (ref 35–70)
LDL Cholesterol: 86
Triglycerides: 80 (ref 40–160)

## 2017-10-01 LAB — PROTIME-INR: Protime: 26.5 — AB (ref 10.0–13.8)

## 2017-10-01 LAB — CBC AND DIFFERENTIAL
HEMATOCRIT: 40 — AB (ref 41–53)
Hemoglobin: 13.5 (ref 13.5–17.5)
NEUTROS ABS: 5
Platelets: 152 (ref 150–399)
WBC: 6.7

## 2017-10-01 LAB — POCT INR: INR: 2.5 — AB (ref 0.9–1.1)

## 2017-10-07 ENCOUNTER — Encounter: Payer: Self-pay | Admitting: Family Medicine

## 2017-10-14 DIAGNOSIS — Z7901 Long term (current) use of anticoagulants: Secondary | ICD-10-CM | POA: Diagnosis not present

## 2017-10-15 ENCOUNTER — Ambulatory Visit: Payer: Medicare Other | Admitting: Internal Medicine

## 2017-10-15 VITALS — BP 106/60 | HR 74 | Ht 70.0 in | Wt 196.0 lb

## 2017-10-15 DIAGNOSIS — I493 Ventricular premature depolarization: Secondary | ICD-10-CM

## 2017-10-15 DIAGNOSIS — Z952 Presence of prosthetic heart valve: Secondary | ICD-10-CM

## 2017-10-15 DIAGNOSIS — I452 Bifascicular block: Secondary | ICD-10-CM | POA: Diagnosis not present

## 2017-10-15 DIAGNOSIS — R001 Bradycardia, unspecified: Secondary | ICD-10-CM

## 2017-10-15 DIAGNOSIS — I259 Chronic ischemic heart disease, unspecified: Secondary | ICD-10-CM | POA: Diagnosis not present

## 2017-10-15 NOTE — Patient Instructions (Addendum)
Medication Instructions:  Your physician recommends that you continue on your current medications as directed. Please refer to the Current Medication list given to you today.  Labwork: None ordered.  Testing/Procedures: Your physician has requested that you have an echocardiogram. Echocardiography is a painless test that uses sound waves to create images of your heart. It provides your doctor with information about the size and shape of your heart and how well your heart's chambers and valves are working. This procedure takes approximately one hour. There are no restrictions for this procedure.   Follow-Up: Your physician wants you to follow-up in: 1 YEAR with Dr. Caryl Comes. You will receive a reminder letter in the mail two months in advance. If you don't receive a letter, please call our office to schedule the follow-up appointment.    If you need a refill on your cardiac medications before your next appointment, please call your pharmacy.

## 2017-10-15 NOTE — Progress Notes (Signed)
Patient Care Team: Tammi Sou, MD as PCP - General (Family Medicine) Deboraha Sprang, MD as Consulting Physician (Cardiology) Deterding, Jeneen Rinks, MD as Consulting Physician (Nephrology) Burtis Junes, NP as Nurse Practitioner (Nurse Practitioner)   HPI  John Barton is a 72 y.o. male Seen in followup for loop recorder implantation. Device now at [facility]   He has a history of syncope with prior myocardial infarction and status post mechanical aortic valve replacement And underwent EP testing demonstrating only nonsustained ventricular tachycardia and nonsustained SVT   He has a history of a wide-complex tachycardia for which he was seen in consultation 2010. For reasons that are not particularly clear, he ended up on Quinaglute.a CBC with differential was obtained March 2014 which was normal   Date Cr K Hgb  4/19 1.3 4.7 13.5         Holter previously reviewed and demonstrated bigeminy which was likely the explanation for the previously described bradycardia   DATE TEST EF   2/14 Echo   55 %   2 /18 Echo   45-50 %            He is back because he had an episode of strange feeling strange.  His daughter, a Marine scientist, took his pulse and noted to be in the 37s.  She took his blood pressure noted to be high at 198/110.  He has not had impairment of exercise performance.  He denies chest pain shortness of breath  No edema  He has occasional nocturnal palpitations particularly on his left side.  He notes skips.  H     Past Medical History:  Diagnosis Date  . Arthritis   . Bifascicular block    First degree AV block, right bundle branch block left anterior fascicular block.  Re-eval with event monitor 07/2017--cardiology  . Blood transfusion without reported diagnosis   . BPH with obstruction/lower urinary tract symptoms   . Carotid stenosis   . 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 Jimmy Footman.  GFR stable at 55 ml/min (Cr 1.28) 07/16/17.  Neph f/u 08/08/17, Cr up to 1.71, GFR 39 (in context of volume overload and impaired bladder emptying due to being out of flomax x 1 wk).  Cr  1.38 and GFR 50 ml/min as of 08/21/17 renal f/u.  Marland Kitchen COPD (chronic obstructive pulmonary disease) ([place])   . Coronary artery disease    CABG 1995.  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 Diabetes mellitus without complication ([place]) 08/4740   New dx 10/15/16 by A1c criteria (6.6%)--recommended nutritionist referral/no meds at initial dx.  . Glaucoma   . Gout    H/O  . H/O paroxysmal supraventricular tachycardia    with wide complex. + Hx of a-fib as well.  Coumadin mgmt --nephrology  . History of renal transplant   . Hyperlipidemia   . Hypertension   . ICD (implantable cardioverter-defibrillator) in place    Normal device check 01/28/16.  . Ischemic cardiomyopathy    Ischemic heart disease and mild depressed LV and RV function  . Post-transplant erythrocytosis   . 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  //  Echo 2/18: mild LVH, EF 45-50, diff HK, Gr 1 DD, severe MAC, mild Mitral Stenosis, normally functioning mechanical AVR (mean 12, peak 25).  Per cards, repeat echo planned for 2020.  Marland Kitchen  Secondary hyperparathyroidism ([place])    parathyroidectomy done; subsequently has hypoparathyroidism  . Sinus bradycardia   . Syncope    He is s/p loop recorder insertion; as of Dr. Olin Pia f/u 01/16/16 he has had no further syncope and no significant arrhythmias.  He elected to keep the loop recorder in and was told to f/u 1 yr with Dr. Caryl Comes.   NORMAL DEVICE REMOTE TRANSMISSION--NO ATRIAL FIBRILLATION--as of 09/02/15.    Past Surgical History:  Procedure Laterality Date  . AORTIC VALVE REPLACEMENT  1995   ST.Jude mechanical valve  . CARDIAC CATHETERIZATION  1995  . CORONARY ARTERY BYPASS GRAFT  1995   RCA andLAD  . ELECTROPHYSIOLOGY  STUDY N/A 09/11/2012   Procedure: ELECTROPHYSIOLOGY STUDY;  Surgeon: Deboraha Sprang, MD;  Location: [facility] CATH LAB;  Service: Cardiovascular;  Laterality: N/A;  . 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 parathyroidectomy    . TRANSTHORACIC ECHOCARDIOGRAM  08/15/12; 08/03/16   2014: Mild LVH and LV dilation, EF 50-55%, normal aortic valve gradients.  2018: EF 45-50%, normal functioning prosthetic AV, diffuse hypokinesis, grd I DD, mild mitral stenosis    Current Outpatient Medications  Medication Sig Dispense Refill  . allopurinol (ZYLOPRIM) 100 MG tablet 1 tablet by mouth in the morning 2 tablets by mouth at night    . atorvastatin (LIPITOR) 80 MG tablet Take 1 tablet (80 mg total) by mouth daily. 90 tablet 3  . calcitRIOL (ROCALTROL) 0.5 MCG capsule Take 0.5 mcg by mouth daily.  3  . furosemide (LASIX) 80 MG tablet Take 80 mg by mouth daily.    Marland Kitchen lisinopril (PRINIVIL,ZESTRIL) 5 MG tablet Take 5 mg by mouth daily.    . Multiple Vitamins-Minerals (OCUVITE PO) Take 1 tablet by mouth daily.     . mycophenolate (CELLCEPT) 500 MG tablet Take 1,000 mg by mouth 2 (two) times daily.     . Naproxen Sodium (ALEVE PO) Take 1 tablet by mouth daily as needed (pain).    . tamsulosin (FLOMAX) 0.4 MG CAPS capsule Take 0.4 mg by mouth daily.     Marland Kitchen warfarin (COUMADIN) 2 MG tablet Take 2 mg by mouth. Takes Sunday, Tuesday, Thursday, Saturday      No current facility-administered medications for this visit.     No Known Allergies  Review of Systems negative except from HPI and PMH  Physical Exam BP 106/60   Pulse 74   Ht _0  (1.778 m)   Wt 196 lb (88.9 kg)   SpO2 92%   BMI 28.12 kg/m  Well developed and nourished in no acute distress HENT normal Neck supple with JVP-flat Clear Irregular rate and rhythm, no murmurs or gallops Abd-soft with active BS No Clubbing cyanosis edema Skin-warm and dry A &  Oriented  Grossly normal sensory and motor function   ECG sinus with freq aPVC pattern of bigeminy    Assessment and  Plan  Syncope  Wide-complex tachycardia  PVCs   Loop recorder insertion  Ischemic heart disease and depressed left ventricular function  Aortic valve replacement-mechanical  Kidney transplant  Sinus node dysfunction  Bifascicular block   Frequent PVCs are identified on ECG.  Unfortunately they are not quantifiable on his biotell monitoring.  We will undertake an echocardiogram to make sure that there is no worsening of LV function--th is would be indication for therapy-is paucity of symptoms do not seem sufficient just yet   We spent  more than 50% of our >25 min visit in face to face counseling regarding the above

## 2017-11-04 ENCOUNTER — Ambulatory Visit (HOSPITAL_COMMUNITY): Payer: Medicare Other | Attending: Cardiology

## 2017-11-04 ENCOUNTER — Other Ambulatory Visit: Payer: Self-pay

## 2017-11-04 DIAGNOSIS — I340 Nonrheumatic mitral (valve) insufficiency: Secondary | ICD-10-CM | POA: Diagnosis not present

## 2017-11-04 DIAGNOSIS — I493 Ventricular premature depolarization: Secondary | ICD-10-CM | POA: Insufficient documentation

## 2017-11-04 DIAGNOSIS — E785 Hyperlipidemia, unspecified: Secondary | ICD-10-CM | POA: Diagnosis not present

## 2017-11-04 DIAGNOSIS — Z95810 Presence of automatic (implantable) cardiac defibrillator: Secondary | ICD-10-CM | POA: Diagnosis not present

## 2017-11-04 DIAGNOSIS — Z952 Presence of prosthetic heart valve: Secondary | ICD-10-CM | POA: Insufficient documentation

## 2017-11-04 DIAGNOSIS — Z951 Presence of aortocoronary bypass graft: Secondary | ICD-10-CM | POA: Insufficient documentation

## 2017-11-04 DIAGNOSIS — N289 Disorder of kidney and ureter, unspecified: Secondary | ICD-10-CM | POA: Insufficient documentation

## 2017-11-04 DIAGNOSIS — J449 Chronic obstructive pulmonary disease, unspecified: Secondary | ICD-10-CM | POA: Insufficient documentation

## 2017-11-04 DIAGNOSIS — I119 Hypertensive heart disease without heart failure: Secondary | ICD-10-CM | POA: Insufficient documentation

## 2017-11-04 DIAGNOSIS — I272 Pulmonary hypertension, unspecified: Secondary | ICD-10-CM | POA: Insufficient documentation

## 2017-11-04 DIAGNOSIS — I452 Bifascicular block: Secondary | ICD-10-CM | POA: Diagnosis not present

## 2017-11-05 ENCOUNTER — Telehealth: Payer: Self-pay

## 2017-11-05 ENCOUNTER — Encounter: Payer: Self-pay | Admitting: Family Medicine

## 2017-11-05 DIAGNOSIS — Z7901 Long term (current) use of anticoagulants: Secondary | ICD-10-CM | POA: Diagnosis not present

## 2017-11-05 NOTE — Telephone Encounter (Signed)
Pt is aware and agreeable to stable hurt muscle function on echo

## 2017-11-05 NOTE — Telephone Encounter (Signed)
-----   Message from Deboraha Sprang, MD sent at 11/04/2017  4:31 PM EDT ----- Please Inform Patient Echo showed  n  Stable  heart muscle function

## 2017-11-06 ENCOUNTER — Encounter: Payer: Self-pay | Admitting: Family Medicine

## 2017-11-18 ENCOUNTER — Encounter: Payer: Self-pay | Admitting: Family Medicine

## 2017-11-18 ENCOUNTER — Ambulatory Visit (INDEPENDENT_AMBULATORY_CARE_PROVIDER_SITE_OTHER): Payer: Medicare Other | Admitting: Family Medicine

## 2017-11-18 VITALS — BP 122/75 | HR 71 | Temp 98.2°F | Resp 16 | Ht 70.0 in | Wt 197.0 lb

## 2017-11-18 DIAGNOSIS — M533 Sacrococcygeal disorders, not elsewhere classified: Secondary | ICD-10-CM | POA: Diagnosis not present

## 2017-11-18 DIAGNOSIS — S300XXA Contusion of lower back and pelvis, initial encounter: Secondary | ICD-10-CM | POA: Diagnosis not present

## 2017-11-18 DIAGNOSIS — S39012A Strain of muscle, fascia and tendon of lower back, initial encounter: Secondary | ICD-10-CM

## 2017-11-18 DIAGNOSIS — Z7901 Long term (current) use of anticoagulants: Secondary | ICD-10-CM | POA: Diagnosis not present

## 2017-11-18 NOTE — Progress Notes (Signed)
OFFICE VISIT  11/18/2017   CC:  Chief Complaint  Patient presents with  . Tailbone Pain    fell May 15 in bath tub   HPI:    Patient is a 72 y.o.  male who presents for tail bone, right glut region pain. Pt fell in bathtub 10/30/17 (2+ weeks ago).  He did not hit anything else. Had begun to feel better and was taking aleve regularly---has not taken any in 3d. No bruising was noted.  When leaning forward he feels a sharp pain. When he sits on R side it hurts, but when getting up and walking things feel fine. No radiation down leg.  No paresthesias.   Past Medical History:  Diagnosis Date  . Arthritis   . Bifascicular block    First degree AV block, right bundle branch block left anterior fascicular block.  Re-eval with event monitor 07/2017--cardiology  . Blood transfusion without reported diagnosis   . BPH with obstruction/lower urinary tract symptoms   . Carotid stenosis   . 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 Jimmy Footman.  GFR stable at 55 ml/min (Cr 1.28) 07/16/17.  Neph f/u 08/08/17, Cr up to 1.71, GFR 39 (in context of volume overload and impaired bladder emptying due to being out of flomax x 1 wk).  Cr  1.38 and GFR 50 ml/min as of 08/21/17 renal f/u.  Cr 1.27, GFR 56 on renal f/u 10/01/17.  Marland Kitchen COPD (chronic obstructive pulmonary disease) ([place])   . Coronary artery disease    CABG 1995.  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 Diabetes mellitus without complication ([place]) 92/4268   New dx 10/15/16 by A1c criteria (6.6%)--recommended nutritionist referral/no meds at initial dx.  . Glaucoma   . Gout    H/O  . H/O paroxysmal supraventricular tachycardia    with wide complex. + Hx of a-fib as well.  Coumadin mgmt --nephrology  . History of renal transplant   . Hyperlipidemia   . Hypertension   . ICD (implantable cardioverter-defibrillator) in place    Normal device check 01/28/16.  .  Ischemic cardiomyopathy    Ischemic heart disease and mild depressed LV and RV function  . Post-transplant erythrocytosis   . 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  //  Echo 2/18: mild LVH, EF 45-50, diff HK, Gr 1 DD, severe MAC, mild Mitral Stenosis, normally functioning mechanical AVR (mean 12, peak 25).  Per cards, repeat echo planned for 2020.  Marland Kitchen Secondary hyperparathyroidism ([place])    parathyroidectomy done; subsequently has hypoparathyroidism, takes vit D  . Sinus bradycardia   . Syncope    He is s/p loop recorder insertion; as of Dr. Olin Pia f/u 01/16/16 he has had no further syncope and no significant arrhythmias.  He elected to keep the loop recorder in and was told to f/u 1 yr with Dr. Caryl Comes.   NORMAL DEVICE REMOTE TRANSMISSION--NO ATRIAL FIBRILLATION--as of 09/02/15.    Past Surgical History:  Procedure Laterality Date  . AORTIC VALVE REPLACEMENT  1995   ST.Jude mechanical valve  . CARDIAC CATHETERIZATION  1995  . CARDIAC EVENT MONITOR  07/2017   PVCs, sinus brady---no significant abnormal findings.  . CORONARY ARTERY BYPASS GRAFT  1995   RCA andLAD  . ELECTROPHYSIOLOGY STUDY N/A 09/11/2012   Procedure: ELECTROPHYSIOLOGY STUDY;  Surgeon: Deboraha Sprang, MD;  Location: [facility] CATH LAB;  Service:  Cardiovascular;  Laterality: N/A;  . 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 parathyroidectomy    . TRANSTHORACIC ECHOCARDIOGRAM  08/15/12; 08/03/16; 11/04/17   2014: Mild LVH and LV dilation, EF 50-55%, normal aortic valve gradients.  2018: EF 45-50%, normal functioning prosthetic AV, diffuse hypokinesis, grd I DD, mild mitral stenosis.  10/2017: EF 40-45%, global hypokinesis, grd II DD, mild MR, severe LAE, mild RV dysfxn, mild inc pulm press.    Outpatient Medications Prior to Visit  Medication Sig Dispense Refill  . allopurinol  (ZYLOPRIM) 100 MG tablet 1 tablet by mouth in the morning 2 tablets by mouth at night    . atorvastatin (LIPITOR) 80 MG tablet Take 1 tablet (80 mg total) by mouth daily. 90 tablet 3  . calcitRIOL (ROCALTROL) 0.5 MCG capsule Take 0.5 mcg by mouth daily.  3  . furosemide (LASIX) 80 MG tablet Take 80 mg by mouth daily.    Marland Kitchen lisinopril (PRINIVIL,ZESTRIL) 5 MG tablet Take 5 mg by mouth daily.    . Multiple Vitamins-Minerals (OCUVITE PO) Take 1 tablet by mouth daily.     . mycophenolate (CELLCEPT) 500 MG tablet Take 1,000 mg by mouth 2 (two) times daily.     . Naproxen Sodium (ALEVE PO) Take 1 tablet by mouth daily as needed (pain).    . tamsulosin (FLOMAX) 0.4 MG CAPS capsule Take 0.4 mg by mouth daily.     Marland Kitchen warfarin (COUMADIN) 2 MG tablet Take 2 mg by mouth. 7.5 mg M,W, F. All other days 48m    . warfarin (COUMADIN) 5 MG tablet TAKE 1 (ONE) TABLET AS DIRECTED  10   No facility-administered medications prior to visit.     No Known Allergies  ROS As per HPI  PE: Blood pressure 122/75, pulse 71, temperature 98.2 F (36.8 C), temperature source Oral, resp. rate 16, height _0  (1.778 m), weight 197 lb (89.4 kg), SpO2 95 %. Gen: Alert, well appearing.  Patient is oriented to person, place, time, and situation. AFFECT: pleasant, lucid thought and speech. Pt has no TTP in L spine or hips/gluts.  He has focal TTP over cranial-most aspect of SI joint on the right. Hip ROM normal bilat, w/out pain. FABER: worsens pain when done on R leg. LE strength 5/5 prox/dist.  LABS:  none  IMPRESSION AND PLAN:  Right glut/sacral contusion, with R SI jt strain. Doubt fracture, but with trauma will get x-ray. PT referral.  No MORE NSAIDS! Take 1000 mg tylenol three times per day as needed for pain.  An After Visit Summary was printed and given to the patient.  FOLLOW UP: Return in about 1 month (around 12/16/2017) for f/u glut contusion/SI joint strain/PT.  Signed:  PCrissie Sickles MD            11/18/2017

## 2017-11-18 NOTE — Patient Instructions (Signed)
Take 1000 mg tylenol three times per day as needed for pain.  Do not take any other medication for pain.

## 2017-11-19 ENCOUNTER — Ambulatory Visit (HOSPITAL_BASED_OUTPATIENT_CLINIC_OR_DEPARTMENT_OTHER)
Admission: RE | Admit: 2017-11-19 | Discharge: 2017-11-19 | Disposition: A | Discharge status: Home / Self Care | Payer: Medicare Other | Source: Ambulatory Visit | Attending: Family Medicine | Admitting: Family Medicine

## 2017-11-19 DIAGNOSIS — M533 Sacrococcygeal disorders, not elsewhere classified: Secondary | ICD-10-CM | POA: Insufficient documentation

## 2017-11-19 DIAGNOSIS — S300XXA Contusion of lower back and pelvis, initial encounter: Secondary | ICD-10-CM

## 2017-11-19 DIAGNOSIS — S3992XA Unspecified injury of lower back, initial encounter: Secondary | ICD-10-CM | POA: Diagnosis not present

## 2017-12-02 DIAGNOSIS — H353112 Nonexudative age-related macular degeneration, right eye, intermediate dry stage: Secondary | ICD-10-CM | POA: Diagnosis not present

## 2017-12-02 DIAGNOSIS — G43809 Other migraine, not intractable, without status migrainosus: Secondary | ICD-10-CM | POA: Diagnosis not present

## 2017-12-02 DIAGNOSIS — H26491 Other secondary cataract, right eye: Secondary | ICD-10-CM | POA: Diagnosis not present

## 2017-12-02 DIAGNOSIS — H353124 Nonexudative age-related macular degeneration, left eye, advanced atrophic with subfoveal involvement: Secondary | ICD-10-CM | POA: Diagnosis not present

## 2017-12-03 DIAGNOSIS — Z7901 Long term (current) use of anticoagulants: Secondary | ICD-10-CM | POA: Diagnosis not present

## 2017-12-10 DIAGNOSIS — Z7901 Long term (current) use of anticoagulants: Secondary | ICD-10-CM | POA: Diagnosis not present

## 2017-12-16 ENCOUNTER — Ambulatory Visit: Payer: Medicare Other | Admitting: Family Medicine

## 2017-12-24 DIAGNOSIS — Z7901 Long term (current) use of anticoagulants: Secondary | ICD-10-CM | POA: Diagnosis not present

## 2017-12-31 ENCOUNTER — Ambulatory Visit: Payer: Medicare Other | Admitting: Nurse Practitioner

## 2017-12-31 ENCOUNTER — Encounter (INDEPENDENT_AMBULATORY_CARE_PROVIDER_SITE_OTHER): Payer: Self-pay

## 2017-12-31 ENCOUNTER — Ambulatory Visit: Payer: Self-pay | Admitting: Nurse Practitioner

## 2017-12-31 ENCOUNTER — Encounter: Payer: Self-pay | Admitting: Nurse Practitioner

## 2017-12-31 VITALS — BP 136/80 | HR 72 | Ht 70.0 in | Wt 195.1 lb

## 2017-12-31 DIAGNOSIS — I259 Chronic ischemic heart disease, unspecified: Secondary | ICD-10-CM | POA: Diagnosis not present

## 2017-12-31 DIAGNOSIS — Z952 Presence of prosthetic heart valve: Secondary | ICD-10-CM

## 2017-12-31 NOTE — Patient Instructions (Signed)
We will be checking the following labs today - NONE   Medication Instructions:    Continue with your current medicines.     Testing/Procedures To Be Arranged:  N/A  Follow-Up:   See Dr. Caryl Comes as planned in April of 2020 and I will see you in October of 2020    Other Special Instructions:   N/A    If you need a refill on your cardiac medications before your next appointment, please call your pharmacy.   Call the [place] office at [phone] if you have any questions, problems or concerns.

## 2017-12-31 NOTE — Progress Notes (Signed)
CARDIOLOGY OFFICE NOTE  Date:  12/31/2017    John Barton Date of Birth: [birth year] Medical Record #[id number]  PCP:  Tammi Sou, MD  Cardiologist:  Caryl Comes    Chief Complaint  Patient presents with  . Coronary Artery Disease  . Congestive Heart Failure  . Irregular Heart Beat    Follow up visit - seen for Dr. Caryl Comes    History of Present Illness: John Barton is a 72 y.o. male who presents today for a follow up visit. Seen for Dr. Caryl Comes.   He has known CAD with remote CABG to the RCA and LAD as well as prior [place]. He had SVT in 2010.For reasons that are not particularly clear, he ended up on [place]- no longer on.He hadhad a loop in place for syncope - it reached ERI - was elected to leave in. Other issues include prior kidney transplant with CKD stage III - sees Dr. Jimmy Footman. Haschronicbifascicular block.  Prior echo 07/2016 showed LVEF of 45-50% (stable), diffuse hypokinesis, grade 1 DD, s/p AVR with normal mean gradient and no AI; severe MAC with mild MS and trace MR. Not symptomatic - on ACE - no beta blocker due to bifascicular block.  I last saw him in January of 2019 and he was doing well.  Seen as a work in by [employer] in February of 2019 - had some episodic shortness of breath and concern for low HR - had event monitor - no significant bradycardia noted. Saw Dr. Caryl Comes in April and had his echo updated - EF 40 to 45%. He has continued to leave his ILR in place despite being at [facility]. He has not had recurrent syncope.   Comes in today. Here alone. He says he is doing well. No chest pain. Breathing is stable. Not lightheaded or dizzy. No swelling. Weight is stable. 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 He continues to ride his motorcycle. Tolerating his medicines. No problems with his coumadin.   Past Medical History:  Diagnosis Date  . Arthritis   . Bifascicular block    First degree AV block, right bundle  branch block left anterior fascicular block.  Re-eval with event monitor 07/2017--cardiology  . Blood transfusion without reported diagnosis   . BPH with obstruction/lower urinary tract symptoms   . Carotid stenosis   . 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 Jimmy Footman.  GFR stable at 55 ml/min (Cr 1.28) 07/16/17.  Neph f/u 08/08/17, Cr up to 1.71, GFR 39 (in context of volume overload and impaired bladder emptying due to being out of flomax x 1 wk).  Cr  1.38 and GFR 50 ml/min as of 08/21/17 renal f/u.  Cr 1.27, GFR 56 on renal f/u 10/01/17.  Marland Kitchen COPD (chronic obstructive pulmonary disease) ([place])   . Coronary artery disease    CABG 1995.  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 Diabetes mellitus without complication ([place]) 73/4287   New dx 10/15/16 by A1c criteria (6.6%)--recommended nutritionist referral/no meds at initial dx.  . Glaucoma   . Gout    H/O  . H/O paroxysmal supraventricular tachycardia    with wide complex. + Hx of a-fib as well.  Coumadin mgmt --nephrology  . History of renal transplant   . Hyperlipidemia   . Hypertension   . ICD (implantable cardioverter-defibrillator) in place    Normal  device check 01/28/16.  . Ischemic cardiomyopathy    Ischemic heart disease and mild depressed LV and RV function  . Post-transplant erythrocytosis   . 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  //  Echo 2/18: mild LVH, EF 45-50, diff HK, Gr 1 DD, severe MAC, mild Mitral Stenosis, normally functioning mechanical AVR (mean 12, peak 25).  Per cards, repeat echo planned for 2020.  Marland Kitchen Secondary hyperparathyroidism ([place])    parathyroidectomy done; subsequently has hypoparathyroidism, takes vit D  . Sinus bradycardia   . Syncope    He is s/p loop recorder insertion; as of Dr. Olin Pia f/u 01/16/16 he has had no further syncope and no significant  arrhythmias.  He elected to keep the loop recorder in and was told to f/u 1 yr with Dr. Caryl Comes.   NORMAL DEVICE REMOTE TRANSMISSION--NO ATRIAL FIBRILLATION--as of 09/02/15.    Past Surgical History:  Procedure Laterality Date  . AORTIC VALVE REPLACEMENT  1995   ST.Jude mechanical valve  . CARDIAC CATHETERIZATION  1995  . CARDIAC EVENT MONITOR  07/2017   PVCs, sinus brady---no significant abnormal findings.  . CORONARY ARTERY BYPASS GRAFT  1995   RCA andLAD  . ELECTROPHYSIOLOGY STUDY N/A 09/11/2012   Procedure: ELECTROPHYSIOLOGY STUDY;  Surgeon: Deboraha Sprang, MD;  Location: [facility] CATH LAB;  Service: Cardiovascular;  Laterality: N/A;  . 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 parathyroidectomy    . TRANSTHORACIC ECHOCARDIOGRAM  08/15/12; 08/03/16; 11/04/17   2014: Mild LVH and LV dilation, EF 50-55%, normal aortic valve gradients.  2018: EF 45-50%, normal functioning prosthetic AV, diffuse hypokinesis, grd I DD, mild mitral stenosis.  10/2017: EF 40-45%, global hypokinesis, grd II DD, mild MR, severe LAE, mild RV dysfxn, mild inc pulm press.     Medications: Current Meds  Medication Sig  . allopurinol (ZYLOPRIM) 100 MG tablet 1 tablet by mouth in the morning 2 tablets by mouth at night  . atorvastatin (LIPITOR) 80 MG tablet Take 1 tablet (80 mg total) by mouth daily.  . calcitRIOL (ROCALTROL) 0.5 MCG capsule Take 0.5 mcg by mouth daily. Take two tablets by mouth on Monday,wed,fri, sun. Take one tablet by mouth tues, thurs and sat.  . furosemide (LASIX) 40 MG tablet Take 40 mg by mouth daily.  Marland Kitchen lisinopril (PRINIVIL,ZESTRIL) 5 MG tablet Take 5 mg by mouth daily.  . Multiple Vitamins-Minerals (OCUVITE PO) Take 1 tablet by mouth daily.   . mycophenolate (CELLCEPT) 500 MG tablet Take 1,000 mg by mouth 2 (two) times daily.   . Naproxen Sodium (ALEVE PO) Take 1 tablet by mouth daily as needed (pain).  .  tamsulosin (FLOMAX) 0.4 MG CAPS capsule Take 0.4 mg by mouth daily.   Marland Kitchen warfarin (COUMADIN) 2 MG tablet Take 2 mg by mouth. 7.5 mg M,W, F. All other days 70m  . warfarin (COUMADIN) 5 MG tablet TAKE 1 (ONE) TABLET AS DIRECTED     Allergies: No Known Allergies  Social History: The patient  reports that he quit smoking about 3 years ago. His smoking use included cigars. He has never used smokeless tobacco. He reports that he drinks about 8.4 oz of alcohol per week. He reports that he does not use drugs.   Family History: The patient's family history includes Alcohol abuse in his brother; Arthritis in his mother; Diabetes in his daughter and father; Heart disease in  his brother, father, and mother; Heart failure in his father and mother; Hyperlipidemia in his father and mother; Hypertension in his father and mother; Liver disease in his brother.   Review of Systems: Please see the history of present illness.   Otherwise, the review of systems is positive for none.   All other systems are reviewed and negative.   Physical Exam: VS:  BP 136/80 (BP Location: Left Arm, Patient Position: Sitting, Cuff Size: Normal)   Pulse 72   Ht 5' 10"  (1.778 m)   Wt 195 lb 1.9 oz (88.5 kg)   SpO2 92% Comment: at rest  BMI 28.00 kg/m  .  BMI Body mass index is 28 kg/m.  Wt Readings from Last 3 Encounters:  12/31/17 195 lb 1.9 oz (88.5 kg)  11/18/17 197 lb (89.4 kg)  10/15/17 196 lb (88.9 kg)    General: Pleasant. Well developed, well nourished and in no acute distress.   HEENT: Normal.  Neck: Supple, no JVD, carotid bruits, or masses noted.  Cardiac: Regular rate and rhythm. Systolic murmur noted. No edema.  Respiratory:  Lungs are clear to auscultation bilaterally with normal work of breathing.  GI: Soft and nontender.  MS: No deformity or atrophy. Gait and ROM intact.  Skin: Warm and dry. Color is normal.  Neuro:  Strength and sensation are intact and no gross focal deficits noted.  Psych:  Alert, appropriate and with normal affect.   LABORATORY DATA:  EKG:  EKG is not ordered today.   Lab Results  Component Value Date   WBC 6.7 10/01/2017   HGB 13.5 10/01/2017   HCT 40 (A) 10/01/2017   PLT 152 10/01/2017   GLUCOSE 116 (H) 08/15/2017   CHOL 143 10/01/2017   TRIG 80 10/01/2017   HDL 41 10/01/2017   LDLCALC 86 10/01/2017   ALT 17 10/01/2017   AST 20 10/01/2017   NA 139 10/01/2017   K 4.7 10/01/2017   CL 100 08/15/2017   CREATININE 1.3 10/01/2017   BUN 25 (A) 10/01/2017   CO2 19 (L) 08/15/2017   TSH 2.62 10/15/2016   INR 2.5 (A) 10/01/2017   HGBA1C 6.1 04/22/2017   Lab Results  Component Value Date   INR 2.5 (A) 10/01/2017   INR 4.7 (A) 09/11/2016   INR 2.6 11/17/2013   PROTIME 26.5 (A) 10/01/2017    BNP (last 3 results) No results for input(s): BNP in the last 8760 hours.  ProBNP (last 3 results) Recent Labs    08/15/17 0949  PROBNP 2,459*     Other Studies Reviewed Today:  Echo Study Conclusions 10/2017 - Left ventricle: The cavity size was mildly dilated. Wall   thickness was increased in a pattern of moderate LVH. Systolic   function was mildly to moderately reduced. The estimated ejection   fraction was in the range of 40% to 45%. Diffuse hypokinesis.   Features are consistent with a pseudonormal left ventricular   filling pattern, with concomitant abnormal relaxation and   increased filling pressure (grade 2 diastolic dysfunction).   Doppler parameters are consistent with high ventricular filling   pressure. - Aortic valve: A mechanical prosthesis was present. There was   trivial regurgitation. - Aortic root: The aortic root was mildly dilated. - Mitral valve: Calcified annulus. There was mild regurgitation. - Left atrium: The atrium was severely dilated. - Right ventricle: The cavity size was mildly dilated. Systolic   function was mildly reduced. - Pulmonary arteries: Systolic pressure was mildly increased.  Impressions:  -  Mild to moderate global reduction in LV systolic function;   moderate diastolic dysfunction; moderate LVH; s/p AVR with mean   gradient of 10 mmHg and trace AI; mildly dilated aortic root;   mild MR; severe LAE; mild RVE with reduced function; trace TR   with mild pulmonary hypertension.   EchoStudy Conclusions2/2018  - Left ventricle: The cavity size was normal. Wall thickness was increased in a pattern of mild LVH. Systolic function was mildly reduced. The estimated ejection fraction was in the range of 45% to 50%. Diffuse hypokinesis. Doppler parameters are consistent with abnormal left ventricular relaxation (grade 1 diastolic dysfunction). Doppler parameters are consistent with high ventricular filling pressure. - Aortic valve: A mechanical prosthesis was present. - Mitral valve: Severely calcified annulus. The findings are consistent with mild stenosis. Valve area by pressure half-time: 1.58 cm^2. - Left atrium: The atrium was mildly dilated.  Impressions:  - Mild global reduction in LV systolic function; mild LVH; grade 1 diastolic dysfunction; elevated LV filling pressure; mild LAE; s/p AVR with normal mean gradient and no AI; severe MAC with mild MS and trace MR;    MyoviewImpressionfrom 2014 Exercise Capacity: Lexiscan with low level exercise. BP Response: Normal blood pressure response. Clinical Symptoms: There is dyspnea. ECG Impression: No significant ST segment change suggestive of ischemia. Comparison with Prior Nuclear Study: No images to compare  Overall Impression: Low risk stress nuclear study with a large, severe, partially reversible inferior defect consistent with prior inferior infarct and minimal peri-infarct ischemia.  LV Ejection Fraction: 53%. LV Wall Motion: Inferior hypokinesis.  Kirk Ruths   Assessment/Plan:  1.Remote episode of syncope - has not recurred. Still with ILR in place - has  declined removal. Not dizzy or lightheaded. Does have bifascicular block. No changes made today. He has had a basically negative event monitor - will continue to follow.   2. History ofWide-complex tachycardiaand sinus node dysfunction. No recurrent syncope or dizziness noted.   3. Underlying loop recorder - at ERI - he does not wish for it to be removed.   4.Ischemic heart disease and depressed left ventricular function- he continues to look compensated. Myoview from 2014 noted. He continues to do well clinically. I have left him on his current regimen.  On ACE. No beta blocker due to bifascicular block.   5. PriorAortic valve replacement St. Jude from 1995 - mechanical- on coumadin. Echo stable last year - will repeat in 2020.  6. History of kidney transplant- followed by nephrology.  7. Chronic coumadin therapy - monitored by nephrology. No obvious problems noted.  Current medicines are reviewed with the patient today.  The patient does not have concerns regarding medicines other than what has been noted above.  The following changes have been made:  See above.  Labs/ tests ordered today include:   No orders of the defined types were placed in this encounter.    Disposition:   FU with Dr. Caryl Comes as planned in April of 2020 - I will be happy to see back 6 months later.   Patient is agreeable to this plan and will call if any problems develop in the interim.   SignedTruitt Merle, NP  12/31/2017 3:55 PM  [facility] [address] [place], [place]  [address] Phone: [phone] Fax: [phone]

## 2018-01-06 DIAGNOSIS — I129 Hypertensive chronic kidney disease with stage 1 through stage 4 chronic kidney disease, or unspecified chronic kidney disease: Secondary | ICD-10-CM | POA: Diagnosis not present

## 2018-01-06 DIAGNOSIS — M1A30X0 Chronic gout due to renal impairment, unspecified site, without tophus (tophi): Secondary | ICD-10-CM | POA: Diagnosis not present

## 2018-01-06 DIAGNOSIS — N183 Chronic kidney disease, stage 3 (moderate): Secondary | ICD-10-CM | POA: Diagnosis not present

## 2018-01-06 DIAGNOSIS — N2581 Secondary hyperparathyroidism of renal origin: Secondary | ICD-10-CM | POA: Diagnosis not present

## 2018-01-06 DIAGNOSIS — Z940 Kidney transplant status: Secondary | ICD-10-CM | POA: Diagnosis not present

## 2018-01-06 DIAGNOSIS — Z7901 Long term (current) use of anticoagulants: Secondary | ICD-10-CM | POA: Diagnosis not present

## 2018-01-20 DIAGNOSIS — Z7901 Long term (current) use of anticoagulants: Secondary | ICD-10-CM | POA: Diagnosis not present

## 2018-01-22 ENCOUNTER — Encounter: Payer: Self-pay | Admitting: Family Medicine

## 2018-01-27 ENCOUNTER — Ambulatory Visit: Payer: Medicare Other | Attending: Family Medicine | Admitting: Occupational Therapy

## 2018-01-27 DIAGNOSIS — H53413 Scotoma involving central area, bilateral: Secondary | ICD-10-CM | POA: Diagnosis not present

## 2018-01-27 NOTE — Therapy (Signed)
[facility] [address] [place], [place], [address] Phone: [phone]   Fax:  [phone]  Occupational Therapy Evaluation  Patient Details  Name: John Barton MRN: [id number] Date of Birth: 1946/03/29 Referring Provider: Dr. Bing Plume   Encounter Date: 01/27/2018  OT End of Session - 01/27/18 1209    Visit Number  1    Number of Visits  1    Authorization Type  [employer] Medicare    OT Start Time  [id number]    OT Stop Time  1030    OT Time Calculation (min)  53 min       Past Medical History:  Diagnosis Date  . Arthritis   . Bifascicular block    First degree AV block, right bundle branch block left anterior fascicular block.  Re-eval with event monitor 07/2017--cardiology  . Blood transfusion without reported diagnosis   . BPH with obstruction/lower urinary tract symptoms   . Carotid stenosis   . 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 Jimmy Footman.  GFR stable at 55 ml/min (Cr 1.28) 07/16/17.  Neph f/u 08/08/17, Cr up to 1.71, GFR 39 (in context of volume overload and impaired bladder emptying due to being out of flomax x 1 wk).  Cr  1.38 and GFR 50 ml/min as of 08/21/17 renal f/u.  Cr 1.27, GFR 56 on renal f/u 10/01/17.  sCr 1.24, GFR 57 on 01/06/18  . COPD (chronic obstructive pulmonary disease) ([place])   . Coronary artery disease    CABG 1995.  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 Diabetes mellitus without complication ([place]) 02/9832   New dx 10/15/16 by A1c criteria (6.6%)--recommended nutritionist referral/no meds at initial dx.  . Glaucoma   . Gout    H/O  . H/O paroxysmal supraventricular tachycardia    with wide complex. + Hx of a-fib as well.  Coumadin mgmt --nephrology  . History of renal transplant   . Hyperlipidemia   . Hypertension   . ICD (implantable cardioverter-defibrillator) in place    Normal device check 01/28/16.  .  Ischemic cardiomyopathy    Ischemic heart disease and mild depressed LV and RV function  . Post-transplant erythrocytosis   . 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  //  Echo 2/18: mild LVH, EF 45-50, diff HK, Gr 1 DD, severe MAC, mild Mitral Stenosis, normally functioning mechanical AVR (mean 12, peak 25).  Per cards, repeat echo planned for 2020.  Marland Kitchen Secondary hyperparathyroidism ([place])    parathyroidectomy done; subsequently has hypoparathyroidism, takes vit D  . Sinus bradycardia   . Syncope    He is s/p loop recorder insertion; as of Dr. Olin Pia f/u 01/16/16 he has had no further syncope and no significant arrhythmias.  He elected to keep the loop recorder in and was told to f/u 1 yr with Dr. Caryl Comes.   NORMAL DEVICE REMOTE TRANSMISSION--NO ATRIAL FIBRILLATION--as of 09/02/15.    Past Surgical History:  Procedure Laterality Date  . AORTIC VALVE REPLACEMENT  1995   ST.Jude mechanical valve  . CARDIAC CATHETERIZATION  1995  . CARDIAC EVENT MONITOR  07/2017   PVCs, sinus brady---no significant abnormal findings.  . CORONARY ARTERY BYPASS GRAFT  1995   RCA andLAD  . ELECTROPHYSIOLOGY STUDY N/A 09/11/2012   Procedure: ELECTROPHYSIOLOGY STUDY;  Surgeon: Deboraha Sprang, MD;  Location: [facility] CATH LAB;  Service:  Cardiovascular;  Laterality: N/A;  . 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 parathyroidectomy    . TRANSTHORACIC ECHOCARDIOGRAM  08/15/12; 08/03/16; 11/04/17   2014: Mild LVH and LV dilation, EF 50-55%, normal aortic valve gradients.  2018: EF 45-50%, normal functioning prosthetic AV, diffuse hypokinesis, grd I DD, mild mitral stenosis.  10/2017: EF 40-45%, global hypokinesis, grd II DD, mild MR, severe LAE, mild RV dysfxn, mild inc pulm press.    There were no vitals filed for this visit.  Subjective Assessment - 01/27/18 1037     Subjective   denies pain    Patient Stated Goals  to see better    Currently in Pain?  No/denies        [facility] OT Assessment - 01/27/18 1043      Assessment   Medical Diagnosis  macular degeneration    Referring Provider  Dr. Bing Plume    Onset Date/Surgical Date  12/12/17      Precautions   Precautions  Fall    Precaution Comments  low vision      Balance Screen   Has the patient fallen in the past 6 months  Yes    How many times?  1    Has the patient had a decrease in activity level because of a fear of falling?   No    Is the patient reluctant to leave their home because of a fear of falling?   No      Home  Environment   Family/patient expects to be discharged to:  Private residence    Home Access  Stairs    Bathroom Shower/Tub  Tub/Shower unit    Lives With  Daughter      Prior Function   Level of Independence  Independent    Vocation  Part time employment    Vocation Requirements  driving, delivers food    Leisure  playing pool      ADL   ADL comments  modified indepedent with all basic ADLs. Pt reports dving and riding a motorcycle.   Therapist cautioned pt about driving due to safety risk.     IADL   Shopping  Takes care of all shopping needs independently    [place] alone or with occasional assistance    Meal Prep  Able to complete simple warm meal prep    Medication Management  Is responsible for taking medication in correct dosages at correct time    Financial Management  Requires assistance   Pt's dtr is handling     Mobility   Mobility Status  Independent      Written Expression   Dominant Hand  Right    Handwriting  100% legible      Vision - History   Visual History  Macular degeneration   cataracts removed 2014     Vision Assessment   Vision Assessment  Vision tested    Right visual acuity tested 1 meter  20/80    Left Visual Acuity tested 1 meter  20/200-1    Reading Acuity  (0.7)   -1   Patient has diffculty  with activities due to visual impairment  Writing checks;Reading bills;Balancing checkbook;Adjusting stove/washing machine dials    Comment  Pt reports that letters ar jumbled on top of one another      Cognition   Overall Cognitive Status  Within Functional Limits for tasks assessed  Mini Mental State Exam   24/30    mild intellectual difficulty     Observation/Other Assessments   Focus on Therapeutic Outcomes (FOTO)   n/a                      OT Education - 01/27/18 1212    Education Details  eccentric viewing, use of 5x hand held mangnifier for spot reading, pebble mini electronic video magnifier for reading continuous text, Pt attempted use of 4x stand magnfiier and he did not find it helpful    Person(s) Educated  Patient    Methods  Explanation;Demonstration;Verbal cues;Handout    Comprehension  Verbalized understanding;Returned demonstration;Verbal cues required          OT Long Term Goals - 01/27/18 1200      OT LONG TERM GOAL #1   Title  n/a 1x visit for eval and treatment            Plan - 01/27/18 1201    Clinical Impression Statement  Pt is a 72 y.o male with macular degeneration who presents with visual impairments which impede performance of ADLS/IADLS. Pt can benefit from skilled occupational therapy to educate pt regarding adapted strategies and adaptive equipment to maximize pt's safety and independence with ADLs/IADLS.     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 Pt reports he continues to drive. therapsit cautioned pt regarding driving and increased risk for injury.    Occupational performance deficits (Please refer to evaluation for details):  ADL's;IADL's;Leisure;Social Participation    Rehab Potential  Good    OT Frequency  1x / week    OT Duration  8  weeks    OT Treatment/Interventions  Self-care/ADL training;Visual/perceptual remediation/compensation;Patient/family education;DME and/or AE instruction    Plan  Pt was seen for a 1x visit for evaluation and treatment on day of eval. No goals were set and education was completed day of eval.    Recommended Other Services  Pt plans to purchase a 5x handheld magnifier or a handheld  electronic video magnifier    Consulted and Agree with Plan of Care  Patient       Patient will benefit from skilled therapeutic intervention in order to improve the following deficits and impairments:  Impaired vision/preception, Decreased knowledge of precautions, Decreased knowledge of use of DME  Visit Diagnosis: Scotoma involving central area, bilateral    Problem List Patient Active Problem List   Diagnosis Date Noted  . Colon cancer screening 08/27/2015  . Prostate cancer screening 08/27/2015  . Upper airway cough syndrome 08/11/2014  . Preventative health care 08/11/2014  . Trigger finger, acquired 01/11/2014  . Yellow jacket sting 12/21/2013  . History of parathyroidectomy 11/20/2013  . Chronic anticoagulation 11/20/2013  . Left rotator cuff tear arthropathy 10/12/2013  . Pectoralis major tendonitis 10/12/2013  . At risk for bleeding 10/11/2013  . Tear of deltoid muscle 10/11/2013  . Ceruminosis 10/04/2013  . Syncope 08/29/2012  . Coronary artery disease-prior inferior wall MI 08/29/2012  . Gout   . COPD (chronic obstructive pulmonary disease) ([place])   . Arthritis   . S/P aortic valve replacement 04/20/2011  . Hx of CABG 04/20/2011  . SVT (supraventricular tachycardia) ([place]) 04/20/2011  . Kidney replaced by transplant 04/20/2011  . Bifascicular block 04/20/2011  . Dyslipidemia 04/20/2011  . Chronic kidney disease 09/10/1996  John Barton 01/27/2018, 12:15 PM Theone Murdoch, OTR/L [phone] Phone: [phone] 12:17 PM 01/27/18 [facility] [address] [place], [place], [address] Phone: [phone]   Fax:  [phone]  Name: John Barton MRN: [id number] Date of Birth: [birth year]

## 2018-02-03 DIAGNOSIS — Z7901 Long term (current) use of anticoagulants: Secondary | ICD-10-CM | POA: Diagnosis not present

## 2018-02-18 DIAGNOSIS — Z7901 Long term (current) use of anticoagulants: Secondary | ICD-10-CM | POA: Diagnosis not present

## 2018-03-04 DIAGNOSIS — Z7901 Long term (current) use of anticoagulants: Secondary | ICD-10-CM | POA: Diagnosis not present

## 2018-03-18 DIAGNOSIS — Z7901 Long term (current) use of anticoagulants: Secondary | ICD-10-CM | POA: Diagnosis not present

## 2018-03-31 DIAGNOSIS — Z7901 Long term (current) use of anticoagulants: Secondary | ICD-10-CM | POA: Diagnosis not present

## 2018-04-07 DIAGNOSIS — Z940 Kidney transplant status: Secondary | ICD-10-CM | POA: Diagnosis not present

## 2018-04-07 DIAGNOSIS — N2581 Secondary hyperparathyroidism of renal origin: Secondary | ICD-10-CM | POA: Diagnosis not present

## 2018-04-07 DIAGNOSIS — I129 Hypertensive chronic kidney disease with stage 1 through stage 4 chronic kidney disease, or unspecified chronic kidney disease: Secondary | ICD-10-CM | POA: Diagnosis not present

## 2018-04-07 DIAGNOSIS — Z7901 Long term (current) use of anticoagulants: Secondary | ICD-10-CM | POA: Diagnosis not present

## 2018-04-07 DIAGNOSIS — N183 Chronic kidney disease, stage 3 (moderate): Secondary | ICD-10-CM | POA: Diagnosis not present

## 2018-04-07 DIAGNOSIS — Z23 Encounter for immunization: Secondary | ICD-10-CM | POA: Diagnosis not present

## 2018-04-07 LAB — BASIC METABOLIC PANEL
BUN: 31 — AB (ref 4–21)
CREATININE: 1.4 — AB (ref 0.6–1.3)
Glucose: 120
POTASSIUM: 4.8 (ref 3.4–5.3)
Sodium: 137 (ref 137–147)

## 2018-04-07 LAB — CBC AND DIFFERENTIAL
HEMATOCRIT: 37 — AB (ref 41–53)
HEMOGLOBIN: 12.1 — AB (ref 13.5–17.5)
NEUTROS ABS: 4
Platelets: 163 (ref 150–399)
WBC: 5.4

## 2018-04-07 LAB — HEPATIC FUNCTION PANEL
ALT: 24 (ref 10–40)
AST: 24 (ref 14–40)
Alkaline Phosphatase: 108 (ref 25–125)
BILIRUBIN, TOTAL: 0.5

## 2018-04-07 LAB — POCT INR: INR: 3.6 — AB (ref 0.9–1.1)

## 2018-04-07 LAB — PROTIME-INR: PROTIME: 34.7 — AB (ref 10.0–13.8)

## 2018-04-16 ENCOUNTER — Encounter: Payer: Self-pay | Admitting: Family Medicine

## 2018-04-27 ENCOUNTER — Encounter: Payer: Self-pay | Admitting: Family Medicine

## 2018-04-28 DIAGNOSIS — Z7901 Long term (current) use of anticoagulants: Secondary | ICD-10-CM | POA: Diagnosis not present

## 2018-05-05 ENCOUNTER — Ambulatory Visit: Payer: Medicare Other | Admitting: Family Medicine

## 2018-05-05 ENCOUNTER — Encounter: Payer: Self-pay | Admitting: Family Medicine

## 2018-05-05 VITALS — BP 152/84 | HR 96 | Temp 98.0°F | Resp 20 | Ht 70.0 in | Wt 198.6 lb

## 2018-05-05 DIAGNOSIS — J40 Bronchitis, not specified as acute or chronic: Secondary | ICD-10-CM

## 2018-05-05 MED ORDER — AMOXICILLIN-POT CLAVULANATE 875-125 MG PO TABS: 1.0000 | ORAL_TABLET | Freq: Two times a day (BID) | ORAL | 0 refills | Status: DC

## 2018-05-05 MED ORDER — METHYLPREDNISOLONE ACETATE 80 MG/ML IJ SUSP
80.0000 mg | Freq: Once | INTRAMUSCULAR | Status: AC
  Administered 2018-05-05: 80 mg via INTRAMUSCULAR

## 2018-05-05 NOTE — Patient Instructions (Addendum)
Rest, hydrate.  + flonase, mucinex (DM if cough) or Coricidin Stop all products with pseudoephedrine. This is not safe with your heart history.  augmentin prescribed, take until completed.  Steroid shot provided today. Please have xray completed at med [facility] [place].  If cough present it can last up to 6-8 weeks.  F/U 1 week of not improved.    Acute Bronchitis, Adult Acute bronchitis is when air tubes (bronchi) in the lungs suddenly get swollen. The condition can make it hard to breathe. It can also cause these symptoms:  A cough.  Coughing up clear, yellow, or green mucus.  Wheezing.  Chest congestion.  Shortness of breath.  A fever.  Body aches.  Chills.  A sore throat.  Follow these instructions at home: Medicines  Take over-the-counter and prescription medicines only as told by your doctor.  If you were prescribed an antibiotic medicine, take it as told by your doctor. Do not stop taking the antibiotic even if you start to feel better. General instructions  Rest.  Drink enough fluids to keep your pee (urine) clear or pale yellow.  Avoid smoking and secondhand smoke. If you smoke and you need help quitting, ask your doctor. Quitting will help your lungs heal faster.  Use an inhaler, cool mist vaporizer, or humidifier as told by your doctor.  Keep all follow-up visits as told by your doctor. This is important. How is this prevented? To lower your risk of getting this condition again:  Wash your hands often with soap and water. If you cannot use soap and water, use hand sanitizer.  Avoid contact with people who have cold symptoms.  Try not to touch your hands to your mouth, nose, or eyes.  Make sure to get the flu shot every year.  Contact a doctor if:  Your symptoms do not get better in 2 weeks. Get help right away if:  You cough up blood.  You have chest pain.  You have very bad shortness of breath.  You become dehydrated.  You faint  (pass out) or keep feeling like you are going to pass out.  You keep throwing up (vomiting).  You have a very bad headache.  Your fever or chills gets worse. This information is not intended to replace advice given to you by your health care provider. Make sure you discuss any questions you have with your health care provider. Document Released: 11/21/2007 Document Revised: 01/11/2016 Document Reviewed: 11/23/2015 Elsevier Interactive Patient Education  [employer].

## 2018-05-05 NOTE — Progress Notes (Signed)
John Barton , 23-Sep-1945, 72 y.o., male MRN: [id number] Patient Care Team    Relationship Specialty Notifications Start End  McGowen, [name], MD PCP - General Family Medicine  06/29/14    Comment: Teena Dunk, Revonda Standard, MD Consulting Physician Cardiology  02/10/15   Deterding, Jeneen Rinks, MD Consulting Physician Nephrology  03/03/15   Burtis Junes, NP Nurse Practitioner Nurse Practitioner  04/22/17     Chief Complaint  Patient presents with  . Cough    congestion,chest congestion,productive cough x 1 week     Subjective: Pt presents for an OV with complaints of productive cough of 1 week duration.  Associated symptoms include fatigue, chest congestion, shortness of breath and sinus pressure, runny nose. He denies fever, chills, edema Nausea or vomit.   COPD (former smoker- not on inhalers), DM (diet controlled), kidney transplant, aortic valve replacement, h/o CABG/CAD. He has had his flu shot.  Pt has tried mucinex w/ psuedoepi to ease their symptoms.   Depression screen [facility] 2/9 04/22/2017 04/09/2016  Decreased Interest 0 0  Down, Depressed, Hopeless 0 0  PHQ - 2 Score 0 0    No Known Allergies Social History   Tobacco Use  . Smoking status: Former Smoker    Types: Cigars    Last attempt to quit: 05/29/2014    Years since quitting: 3.9  . Smokeless tobacco: Never Used  . Tobacco comment: quit cigarettes '06  Substance Use Topics  . Alcohol use: Yes    Alcohol/week: 14.0 standard drinks    Types: 14 Cans of beer per week    Comment: 2 beers/daily   Past Medical History:  Diagnosis Date  . Arthritis   . Bifascicular block    First degree AV block, right bundle branch block left anterior fascicular block.  Re-eval with event monitor 07/2017--cardiology  . Blood transfusion without reported diagnosis   . BPH with obstruction/lower urinary tract symptoms   . Carotid stenosis   . Chronic gout    due to renal impairment  . 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 Jimmy Footman.  GFR stable at 55 ml/min (Cr 1.28) 07/16/17.  Neph f/u 08/08/17, Cr up to 1.71, GFR 39 (in context of volume overload and impaired bladder emptying due to being out of flomax x 1 wk).  Cr  1.38 and GFR 50 ml/min as of 08/21/17 renal f/u.  Cr 1.27, GFR 56 on renal f/u 10/01/17.  sCr 1.24, GFR 57 on 01/06/18  . COPD (chronic obstructive pulmonary disease) ([place])   . Coronary artery disease    CABG 1995.  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 Diabetes mellitus without complication ([place]) 20/1007   New dx 10/15/16 by A1c criteria (6.6%)--recommended nutritionist referral/no meds at initial dx.  . Glaucoma   . H/O paroxysmal supraventricular tachycardia    with wide complex. + Hx of a-fib as well.  Coumadin mgmt --nephrology  . History of renal transplant   . Hyperlipidemia   . Hypertension   . ICD (implantable cardioverter-defibrillator) in place    Normal device check 01/28/16.  . Ischemic cardiomyopathy    Ischemic heart disease and mild depressed LV and RV function  . Post-transplant erythrocytosis   . 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  //  Echo 2/18: mild LVH, EF 45-50, diff HK, Gr 1 DD, severe MAC, mild  Mitral Stenosis, normally functioning mechanical AVR (mean 12, peak 25).  Per cards, repeat echo planned for 2020.  Marland Kitchen Secondary hyperparathyroidism ([place])    parathyroidectomy done; subsequently has hypoparathyroidism, takes vit D  . Sinus bradycardia   . Syncope    He is s/p loop recorder insertion; as of Dr. Olin Pia f/u 01/16/16 he has had no further syncope and no significant arrhythmias.  He elected to keep the loop recorder in and was told to f/u 1 yr with Dr. Caryl Comes.   NORMAL DEVICE REMOTE TRANSMISSION--NO ATRIAL FIBRILLATION--as of 09/02/15.   Past Surgical History:  Procedure Laterality Date  . AORTIC VALVE REPLACEMENT   1995   ST.Jude mechanical valve  . CARDIAC CATHETERIZATION  1995  . CARDIAC EVENT MONITOR  07/2017   PVCs, sinus brady---no significant abnormal findings.  . CORONARY ARTERY BYPASS GRAFT  1995   RCA andLAD  . ELECTROPHYSIOLOGY STUDY N/A 09/11/2012   Procedure: ELECTROPHYSIOLOGY STUDY;  Surgeon: Deboraha Sprang, MD;  Location: [facility] CATH LAB;  Service: Cardiovascular;  Laterality: N/A;  . 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 parathyroidectomy    . TRANSTHORACIC ECHOCARDIOGRAM  08/15/12; 08/03/16; 11/04/17   2014: Mild LVH and LV dilation, EF 50-55%, normal aortic valve gradients.  2018: EF 45-50%, normal functioning prosthetic AV, diffuse hypokinesis, grd I DD, mild mitral stenosis.  10/2017: EF 40-45%, global hypokinesis, grd II DD, mild MR, severe LAE, mild RV dysfxn, mild inc pulm press.   Family History  Problem Relation Age of Onset  . Heart failure Father   . Heart disease Father   . Hyperlipidemia Father   . Hypertension Father   . Diabetes Father   . Arthritis Mother   . Heart disease Mother   . Hyperlipidemia Mother   . Hypertension Mother   . Heart failure Mother   . Heart disease Brother   . Diabetes Daughter   . Alcohol abuse Brother   . Liver disease Brother        Liver transplant, ETOH   Allergies as of 05/05/2018   No Known Allergies     Medication List        Accurate as of 05/05/18  3:30 PM. Always use your most recent med list.          ALEVE PO Take 1 tablet by mouth daily as needed (pain).   allopurinol 100 MG tablet Commonly known as:  ZYLOPRIM 1 tablet by mouth in the morning 2 tablets by mouth at night   atorvastatin 80 MG tablet Commonly known as:  LIPITOR Take 1 tablet (80 mg total) by mouth daily.   calcitRIOL 0.5 MCG capsule Commonly known as:  ROCALTROL Take 0.5 mcg by mouth daily. Take two tablets by mouth on Monday,wed,fri, sun. Take one  tablet by mouth tues, thurs and sat.   furosemide 40 MG tablet Commonly known as:  LASIX Take 40 mg by mouth daily.   lisinopril 5 MG tablet Commonly known as:  PRINIVIL,ZESTRIL Take 5 mg by mouth daily.   mycophenolate 500 MG tablet Commonly known as:  CELLCEPT Take 1,000 mg by mouth 2 (two) times daily.   OCUVITE PO Take 1 tablet by mouth daily.   tamsulosin 0.4 MG Caps capsule Commonly known as:  FLOMAX Take 0.4 mg by mouth daily.   warfarin 2 MG tablet Commonly known as:  COUMADIN Take 2 mg by mouth. 7.5 mg M,W, F. All other  days 31m   warfarin 5 MG tablet Commonly known as:  COUMADIN TAKE 1 (ONE) TABLET AS DIRECTED       All past medical history, surgical history, allergies, family history, immunizations andmedications were updated in the EMR today and reviewed under the history and medication portions of their EMR.     ROS: Negative, with the exception of above mentioned in HPI  Objective:  BP (!) 152/84 (BP Location: Right Arm, Patient Position: Sitting, Cuff Size: Large)   Pulse 96   Temp 98 F (36.7 C)   Resp 20   Ht _0  (1.778 m)   Wt 198 lb 9.6 oz (90.1 kg)   SpO2 94%   BMI 28.50 kg/m  Body mass index is 28.5 kg/m. Gen: Afebrile. No acute distress. Nontoxic in appearance, well developed, well nourished. Appears fatigued HENT: AT. [place]. Bilateral TM visualized WNL. MMM, no oral lesions. Bilateral nares with erythema, swelling and drainage. Throat without erythema or exudates. Cough and hoarseness present.  Eyes:Pupils Equal Round Reactive to light, Extraocular movements intact,  Conjunctiva without redness, discharge or icterus. Neck/lymp/endocrine: Supple,no lymphadenopathy CV: irreg irreg,  No murmur, no edema Chest: mild wheezing, rhonchi present, diminished breath sounds bilateral.  Skin: No rashes, purpura or petechiae.  Neuro: Normal gait. PERLA. EOMi. Alert. Oriented x3    No exam data present No results found. No results found for this  or any previous visit (from the past 24 hour(s)).  Assessment/Plan: RGEMAYEL MASCIOis a 72y.o. male present for OV for  Bronchitis - concern for pna. - DG Chest 2 View; Future Rest, hydrate.  + flonase, mucinex (DM if cough) or Coricidn, nettie pot or nasal saline.  augmentin prescribed, take until completed.  IM depo medrol today, CXR ordered If cough present it can last up to 6-8 weeks.  F/U 1-2 weeks if not improved.  - duoneb tx in office today--> improved  air movement and pulse ox up to 96% - STOP all products with sudafed (pseudoephedrine)--> start mucinex DM or coricidin.    Reviewed expectations re: course of current medical issues.  Discussed self-management of symptoms.  Outlined signs and symptoms indicating need for more acute intervention.  Patient verbalized understanding and all questions were answered.  Patient received an After-Visit Summary.    No orders of the defined types were placed in this encounter.    Note is dictated utilizing voice recognition software. Although note has been proof read prior to signing, occasional typographical errors still can be missed. If any questions arise, please do not hesitate to call for verification.   electronically signed by:  RHoward Pouch DO  [place]

## 2018-05-06 ENCOUNTER — Telehealth: Payer: Self-pay | Admitting: Family Medicine

## 2018-05-06 ENCOUNTER — Ambulatory Visit (HOSPITAL_BASED_OUTPATIENT_CLINIC_OR_DEPARTMENT_OTHER)
Admission: RE | Admit: 2018-05-06 | Discharge: 2018-05-06 | Disposition: A | Discharge status: Home / Self Care | Payer: Medicare Other | Source: Ambulatory Visit | Attending: Family Medicine | Admitting: Family Medicine

## 2018-05-06 DIAGNOSIS — J40 Bronchitis, not specified as acute or chronic: Secondary | ICD-10-CM | POA: Diagnosis not present

## 2018-05-06 DIAGNOSIS — R05 Cough: Secondary | ICD-10-CM | POA: Diagnosis not present

## 2018-05-06 DIAGNOSIS — R918 Other nonspecific abnormal finding of lung field: Secondary | ICD-10-CM | POA: Diagnosis not present

## 2018-05-06 NOTE — Telephone Encounter (Signed)
Please inform patient the following information: His Chest xray did show signs of possible early pneumonia in the lower lungs. Take the medications as prescribed and follow up with his PCP in 2 weeks, sooner if worsening instead of improving.

## 2018-05-06 NOTE — Telephone Encounter (Signed)
Pt advised and voiced understanding.    F/u apt made for 05/20/18 at 8:45am with Dr. Anitra Lauth.

## 2018-05-12 DIAGNOSIS — Z7901 Long term (current) use of anticoagulants: Secondary | ICD-10-CM | POA: Diagnosis not present

## 2018-05-20 ENCOUNTER — Encounter: Payer: Self-pay | Admitting: Family Medicine

## 2018-05-20 ENCOUNTER — Ambulatory Visit (INDEPENDENT_AMBULATORY_CARE_PROVIDER_SITE_OTHER): Payer: Medicare Other | Admitting: Family Medicine

## 2018-05-20 VITALS — BP 98/61 | HR 67 | Temp 98.2°F | Resp 16 | Ht 70.0 in | Wt 189.4 lb

## 2018-05-20 DIAGNOSIS — J180 Bronchopneumonia, unspecified organism: Secondary | ICD-10-CM | POA: Diagnosis not present

## 2018-05-20 DIAGNOSIS — J410 Simple chronic bronchitis: Secondary | ICD-10-CM

## 2018-05-20 NOTE — Progress Notes (Signed)
OFFICE VISIT  05/20/2018   CC:  Chief Complaint  Patient presents with  . Follow-up    pneumonia    HPI:    Patient is a 72 y.o. Caucasian male who is s/p kidney transplant and has CRI III who presents for 2 week f/u bronchopneumonia. He has a mechanical aortic valve and is on coumadin therapy.  Pt treated with IM steroid and neb in office last visit, started on augmentin.  CXR concerning for possible pneumonia. Interim hx: feeling much improved.  Feels back to baseline now.  Not even requiring mucinex dm the last couple of days. Has chronic morning cough with mucous production at baseline.  Sometimes same sx's later in the day as well. No baseline wheezing or SOB.  He is not limited by any respiratory symptoms. He has never been on inhaler medication.  He quit smoking 2006.   CXR 05/06/18: EXAM: CHEST - 2 VIEW  COMPARISON:  12/04/2008  FINDINGS: Postop CABG.  Heart size upper normal.  Atherosclerotic aortic arch.  Negative for edema or effusion. Bibasilar airspace disease could represent atelectasis or pneumonia.  IMPRESSION: Mild bibasilar atelectasis/infiltrate.  Negative for heart failure.  Past Medical History:  Diagnosis Date  . Arthritis   . Bifascicular block    First degree AV block, right bundle branch block left anterior fascicular block.  Re-eval with event monitor 07/2017--cardiology  . Blood transfusion without reported diagnosis   . BPH with obstruction/lower urinary tract symptoms   . Carotid stenosis   . Chronic gout    due to renal impairment  . 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 Jimmy Footman.  GFR stable at 55 ml/min (Cr 1.28) 07/16/17.  Neph f/u 08/08/17, Cr up to 1.71, GFR 39 (in context of volume overload and impaired bladder emptying due to being out of flomax x 1 wk).  Cr  1.38 and GFR 50 ml/min as of 08/21/17 renal f/u.  Cr 1.27, GFR 56 on renal f/u  10/01/17.  sCr 1.24, GFR 57 on 01/06/18  . COPD (chronic obstructive pulmonary disease) ([place])   . Coronary artery disease    CABG 1995.  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 Diabetes mellitus without complication ([place]) 78/2956   New dx 10/15/16 by A1c criteria (6.6%)--recommended nutritionist referral/no meds at initial dx.  . Glaucoma   . H/O paroxysmal supraventricular tachycardia    with wide complex. + Hx of a-fib as well.  Coumadin mgmt --nephrology  . History of renal transplant   . Hyperlipidemia   . Hypertension   . ICD (implantable cardioverter-defibrillator) in place    Normal device check 01/28/16.  . Ischemic cardiomyopathy    Ischemic heart disease and mild depressed LV and RV function  . Post-transplant erythrocytosis   . 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  //  Echo 2/18: mild LVH, EF 45-50, diff HK, Gr 1 DD, severe MAC, mild Mitral Stenosis, normally functioning mechanical AVR (mean 12, peak 25).  Per cards, repeat echo planned for 2020.  Marland Kitchen Secondary hyperparathyroidism ([place])    parathyroidectomy done; subsequently has hypoparathyroidism, takes vit D  . Sinus bradycardia   . Syncope    He is s/p loop recorder insertion; as of Dr. Olin Pia f/u 01/16/16 he has had no further syncope and no significant arrhythmias.  He elected to keep the loop recorder in and was told to f/u  1 yr with Dr. Caryl Comes.   NORMAL DEVICE REMOTE TRANSMISSION--NO ATRIAL FIBRILLATION--as of 09/02/15.    Past Surgical History:  Procedure Laterality Date  . AORTIC VALVE REPLACEMENT  1995   [facility] mechanical valve  . CARDIAC CATHETERIZATION  1995  . CARDIAC EVENT MONITOR  07/2017   PVCs, sinus brady---no significant abnormal findings.  . CORONARY ARTERY BYPASS GRAFT  1995   RCA andLAD  . ELECTROPHYSIOLOGY STUDY N/A 09/11/2012   Procedure: ELECTROPHYSIOLOGY STUDY;  Surgeon: Deboraha Sprang, MD;  Location: [facility] CATH LAB;  Service:  Cardiovascular;  Laterality: N/A;  . 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 parathyroidectomy    . TRANSTHORACIC ECHOCARDIOGRAM  08/15/12; 08/03/16; 11/04/17   2014: Mild LVH and LV dilation, EF 50-55%, normal aortic valve gradients.  2018: EF 45-50%, normal functioning prosthetic AV, diffuse hypokinesis, grd I DD, mild mitral stenosis.  10/2017: EF 40-45%, global hypokinesis, grd II DD, mild MR, severe LAE, mild RV dysfxn, mild inc pulm press.    Outpatient Medications Prior to Visit  Medication Sig Dispense Refill  . allopurinol (ZYLOPRIM) 100 MG tablet 1 tablet by mouth in the morning 2 tablets by mouth at night    . atorvastatin (LIPITOR) 80 MG tablet Take 1 tablet (80 mg total) by mouth daily. 90 tablet 3  . calcitRIOL (ROCALTROL) 0.5 MCG capsule Take 0.5 mcg by mouth daily. Take two tablets by mouth on Monday,wed,fri, sun. Take one tablet by mouth tues, thurs and sat.    . furosemide (LASIX) 40 MG tablet Take 40 mg by mouth daily.    Marland Kitchen lisinopril (PRINIVIL,ZESTRIL) 5 MG tablet Take 5 mg by mouth daily.    . Multiple Vitamins-Minerals (OCUVITE PO) Take 1 tablet by mouth daily.     . mycophenolate (CELLCEPT) 500 MG tablet Take 1,000 mg by mouth 2 (two) times daily.     . Naproxen Sodium (ALEVE PO) Take 1 tablet by mouth daily as needed (pain).    . tamsulosin (FLOMAX) 0.4 MG CAPS capsule Take 0.4 mg by mouth daily.     Marland Kitchen warfarin (COUMADIN) 2 MG tablet Take 2 mg by mouth. 7.5 mg M,W, F. All other days 17m    . warfarin (COUMADIN) 5 MG tablet TAKE 1 (ONE) TABLET AS DIRECTED  10  . amoxicillin-clavulanate (AUGMENTIN) 875-125 MG tablet Take 1 tablet by mouth 2 (two) times daily. (Patient not taking: Reported on 05/20/2018) 20 tablet 0   No facility-administered medications prior to visit.     No Known Allergies  ROS As per HPI  PE: Blood pressure 98/61, pulse 67, temperature 98.2 F (36.8  C), temperature source Oral, resp. rate 16, height _0  (1.778 m), weight 189 lb 6 oz (85.9 kg), SpO2 93 %. Gen: Alert, well appearing.  Patient is oriented to person, place, time, and situation. AFFECT: pleasant, lucid thought and speech. EELF:YBOF no injection, icteris, swelling, or exudate.  EOMI, PERRLA. Mouth: lips without lesion/swelling.  Oral mucosa pink and moist. Oropharynx without erythema, exudate, or swelling.  Neck - No masses or thyromegaly or limitation in range of motion CV: RRR, mechanical S2, no m/r/g.   LUNGS: CTA bilat, nonlabored resps, good aeration in all lung fields. EXT: no clubbing or cyanosis.  no edema.    LABS:    Chemistry      Component Value Date/Time   NA 137 04/07/2018   K 4.8 04/07/2018   K 5.3 11/17/2013  CL 100 08/15/2017 0949   CL 100 11/17/2013   CO2 19 (L) 08/15/2017 0949   BUN 31 (A) 04/07/2018   CREATININE 1.4 (A) 04/07/2018   CREATININE 2.01 (H) 08/15/2017 0949   CREATININE 1.34 11/17/2013   GLU 120 04/07/2018      Component Value Date/Time   CALCIUM 8.3 (L) 08/15/2017 0949   CALCIUM 10.0 11/17/2013   ALKPHOS 108 04/07/2018   ALKPHOS 101 11/17/2013   AST 24 04/07/2018   AST 25 11/17/2013   ALT 24 04/07/2018   BILITOT 0.7 10/15/2016 0858     Lab Results  Component Value Date   WBC 5.4 04/07/2018   HGB 12.1 (A) 04/07/2018   HCT 37 (A) 04/07/2018   MCV 86 08/15/2017   PLT 163 04/07/2018   Lab Results  Component Value Date   HGBA1C 6.1 04/22/2017   IMPRESSION AND PLAN:  1) Bronchopneumonia: resolved.  2) Chronic bronchitis: he is not limited at all by his chronic cough.   No inhaler med at this time. Fortunately he quit smoking 13 yrs ago.  An After Visit Summary was printed and given to the patient.  FOLLOW UP: Return if symptoms worsen or fail to improve.  Signed:  Crissie Sickles, MD           05/20/2018

## 2018-05-26 DIAGNOSIS — Z7901 Long term (current) use of anticoagulants: Secondary | ICD-10-CM | POA: Diagnosis not present

## 2018-06-02 DIAGNOSIS — H353124 Nonexudative age-related macular degeneration, left eye, advanced atrophic with subfoveal involvement: Secondary | ICD-10-CM | POA: Diagnosis not present

## 2018-06-02 DIAGNOSIS — G43809 Other migraine, not intractable, without status migrainosus: Secondary | ICD-10-CM | POA: Diagnosis not present

## 2018-06-02 DIAGNOSIS — H26491 Other secondary cataract, right eye: Secondary | ICD-10-CM | POA: Diagnosis not present

## 2018-06-02 DIAGNOSIS — H353112 Nonexudative age-related macular degeneration, right eye, intermediate dry stage: Secondary | ICD-10-CM | POA: Diagnosis not present

## 2018-06-09 DIAGNOSIS — Z7901 Long term (current) use of anticoagulants: Secondary | ICD-10-CM | POA: Diagnosis not present

## 2018-06-23 DIAGNOSIS — Z7901 Long term (current) use of anticoagulants: Secondary | ICD-10-CM | POA: Diagnosis not present

## 2018-07-08 DIAGNOSIS — M546 Pain in thoracic spine: Secondary | ICD-10-CM | POA: Diagnosis not present

## 2018-07-08 DIAGNOSIS — M9902 Segmental and somatic dysfunction of thoracic region: Secondary | ICD-10-CM | POA: Diagnosis not present

## 2018-07-08 DIAGNOSIS — M542 Cervicalgia: Secondary | ICD-10-CM | POA: Diagnosis not present

## 2018-07-08 DIAGNOSIS — M9901 Segmental and somatic dysfunction of cervical region: Secondary | ICD-10-CM | POA: Diagnosis not present

## 2018-07-09 DIAGNOSIS — M546 Pain in thoracic spine: Secondary | ICD-10-CM | POA: Diagnosis not present

## 2018-07-09 DIAGNOSIS — M9901 Segmental and somatic dysfunction of cervical region: Secondary | ICD-10-CM | POA: Diagnosis not present

## 2018-07-09 DIAGNOSIS — M542 Cervicalgia: Secondary | ICD-10-CM | POA: Diagnosis not present

## 2018-07-09 DIAGNOSIS — M9902 Segmental and somatic dysfunction of thoracic region: Secondary | ICD-10-CM | POA: Diagnosis not present

## 2018-07-10 DIAGNOSIS — M542 Cervicalgia: Secondary | ICD-10-CM | POA: Diagnosis not present

## 2018-07-10 DIAGNOSIS — M9901 Segmental and somatic dysfunction of cervical region: Secondary | ICD-10-CM | POA: Diagnosis not present

## 2018-07-10 DIAGNOSIS — M546 Pain in thoracic spine: Secondary | ICD-10-CM | POA: Diagnosis not present

## 2018-07-10 DIAGNOSIS — M9902 Segmental and somatic dysfunction of thoracic region: Secondary | ICD-10-CM | POA: Diagnosis not present

## 2018-07-14 DIAGNOSIS — M542 Cervicalgia: Secondary | ICD-10-CM | POA: Diagnosis not present

## 2018-07-14 DIAGNOSIS — M9901 Segmental and somatic dysfunction of cervical region: Secondary | ICD-10-CM | POA: Diagnosis not present

## 2018-07-14 DIAGNOSIS — M9902 Segmental and somatic dysfunction of thoracic region: Secondary | ICD-10-CM | POA: Diagnosis not present

## 2018-07-14 DIAGNOSIS — M546 Pain in thoracic spine: Secondary | ICD-10-CM | POA: Diagnosis not present

## 2018-07-15 DIAGNOSIS — M1A30X0 Chronic gout due to renal impairment, unspecified site, without tophus (tophi): Secondary | ICD-10-CM | POA: Diagnosis not present

## 2018-07-15 DIAGNOSIS — N183 Chronic kidney disease, stage 3 (moderate): Secondary | ICD-10-CM | POA: Diagnosis not present

## 2018-07-15 DIAGNOSIS — Z7901 Long term (current) use of anticoagulants: Secondary | ICD-10-CM | POA: Diagnosis not present

## 2018-07-15 DIAGNOSIS — B078 Other viral warts: Secondary | ICD-10-CM | POA: Diagnosis not present

## 2018-07-15 DIAGNOSIS — I129 Hypertensive chronic kidney disease with stage 1 through stage 4 chronic kidney disease, or unspecified chronic kidney disease: Secondary | ICD-10-CM | POA: Diagnosis not present

## 2018-07-15 DIAGNOSIS — Z940 Kidney transplant status: Secondary | ICD-10-CM | POA: Diagnosis not present

## 2018-07-15 DIAGNOSIS — L570 Actinic keratosis: Secondary | ICD-10-CM | POA: Diagnosis not present

## 2018-07-15 DIAGNOSIS — N2581 Secondary hyperparathyroidism of renal origin: Secondary | ICD-10-CM | POA: Diagnosis not present

## 2018-07-15 DIAGNOSIS — L821 Other seborrheic keratosis: Secondary | ICD-10-CM | POA: Diagnosis not present

## 2018-07-15 LAB — HEPATIC FUNCTION PANEL
ALT: 23 (ref 10–40)
AST: 25 (ref 14–40)
Alkaline Phosphatase: 119 (ref 25–125)
BILIRUBIN, TOTAL: 0.4

## 2018-07-15 LAB — BASIC METABOLIC PANEL
BUN: 37 — AB (ref 4–21)
Creatinine: 1.4 — AB (ref 0.6–1.3)
GLUCOSE: 117
POTASSIUM: 4.5 (ref 3.4–5.3)
SODIUM: 139 (ref 137–147)

## 2018-07-15 LAB — CBC AND DIFFERENTIAL
HEMATOCRIT: 42 (ref 41–53)
Hemoglobin: 13.8 (ref 13.5–17.5)
NEUTROS ABS: 6
PLATELETS: 188 (ref 150–399)
WBC: 7.5

## 2018-07-16 DIAGNOSIS — M542 Cervicalgia: Secondary | ICD-10-CM | POA: Diagnosis not present

## 2018-07-16 DIAGNOSIS — M9901 Segmental and somatic dysfunction of cervical region: Secondary | ICD-10-CM | POA: Diagnosis not present

## 2018-07-16 DIAGNOSIS — M9902 Segmental and somatic dysfunction of thoracic region: Secondary | ICD-10-CM | POA: Diagnosis not present

## 2018-07-16 DIAGNOSIS — M546 Pain in thoracic spine: Secondary | ICD-10-CM | POA: Diagnosis not present

## 2018-07-17 ENCOUNTER — Encounter: Payer: Self-pay | Admitting: Family Medicine

## 2018-07-29 DIAGNOSIS — Z7901 Long term (current) use of anticoagulants: Secondary | ICD-10-CM | POA: Diagnosis not present

## 2018-07-29 DIAGNOSIS — N183 Chronic kidney disease, stage 3 (moderate): Secondary | ICD-10-CM | POA: Diagnosis not present

## 2018-08-11 DIAGNOSIS — Z7901 Long term (current) use of anticoagulants: Secondary | ICD-10-CM | POA: Diagnosis not present

## 2018-09-01 DIAGNOSIS — Z7901 Long term (current) use of anticoagulants: Secondary | ICD-10-CM | POA: Diagnosis not present

## 2018-09-15 DIAGNOSIS — Z7901 Long term (current) use of anticoagulants: Secondary | ICD-10-CM | POA: Diagnosis not present

## 2018-09-17 IMAGING — DX DG SACRUM/COCCYX 2+V
3 series · 3 of 3 positions shown · non-contrast
Comparison: None.

CLINICAL DATA: Fall 2 weeks ago. Tenderness over right SI joint
region

EXAM:
SACRUM AND COCCYX - 2+ VIEW

[coccyx ap]
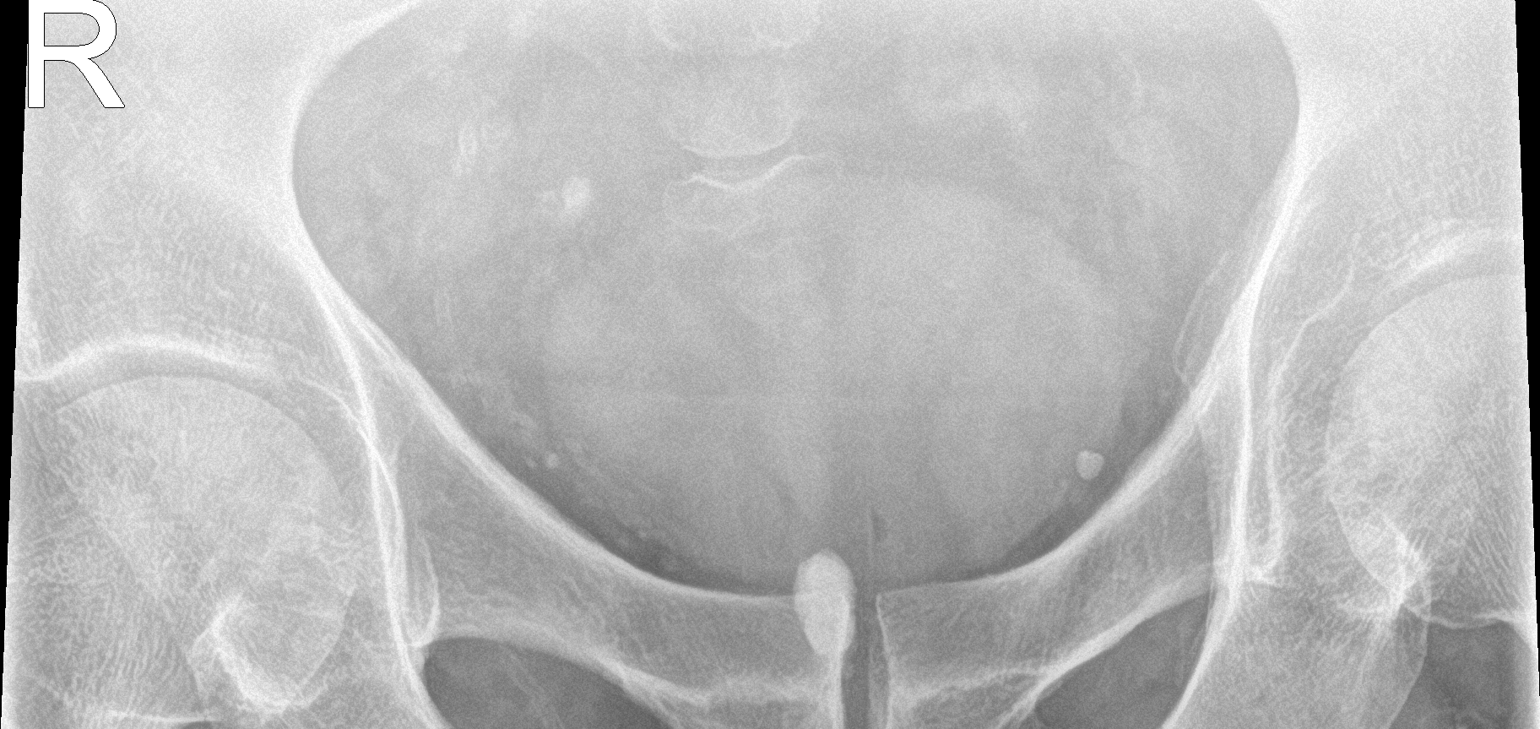

[sacrum ap]
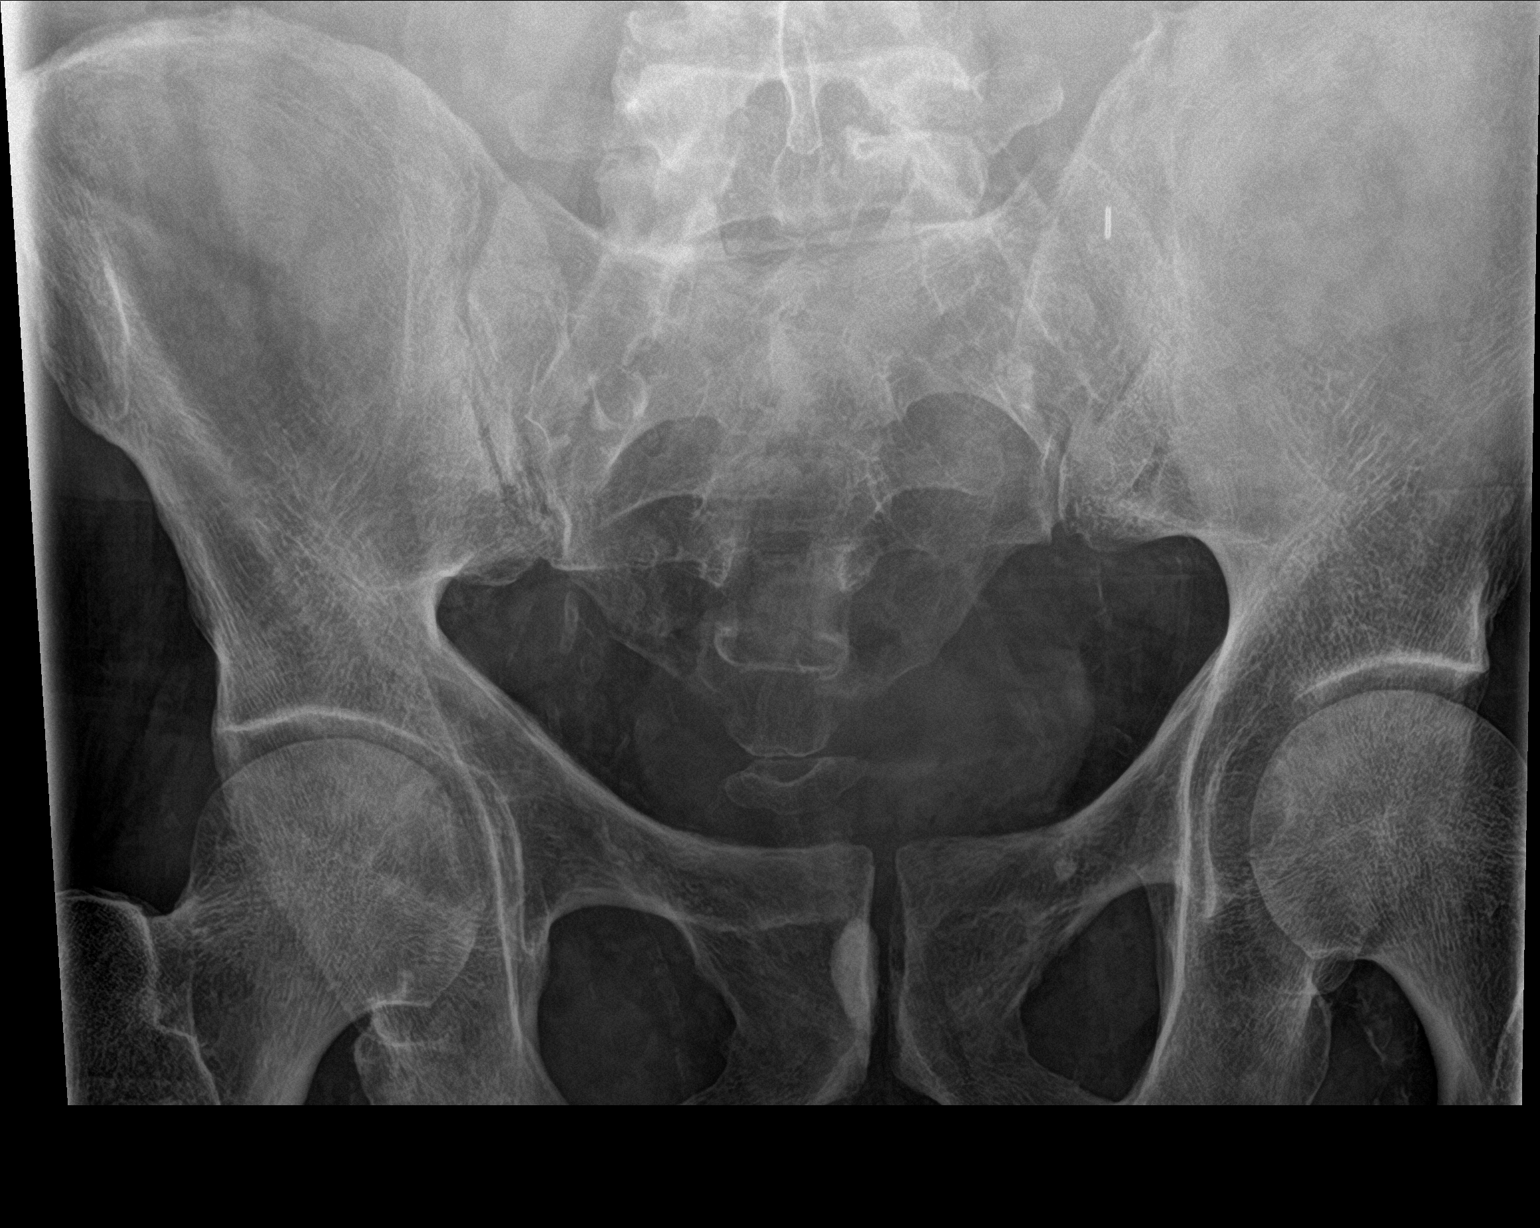

[sacrum lat]
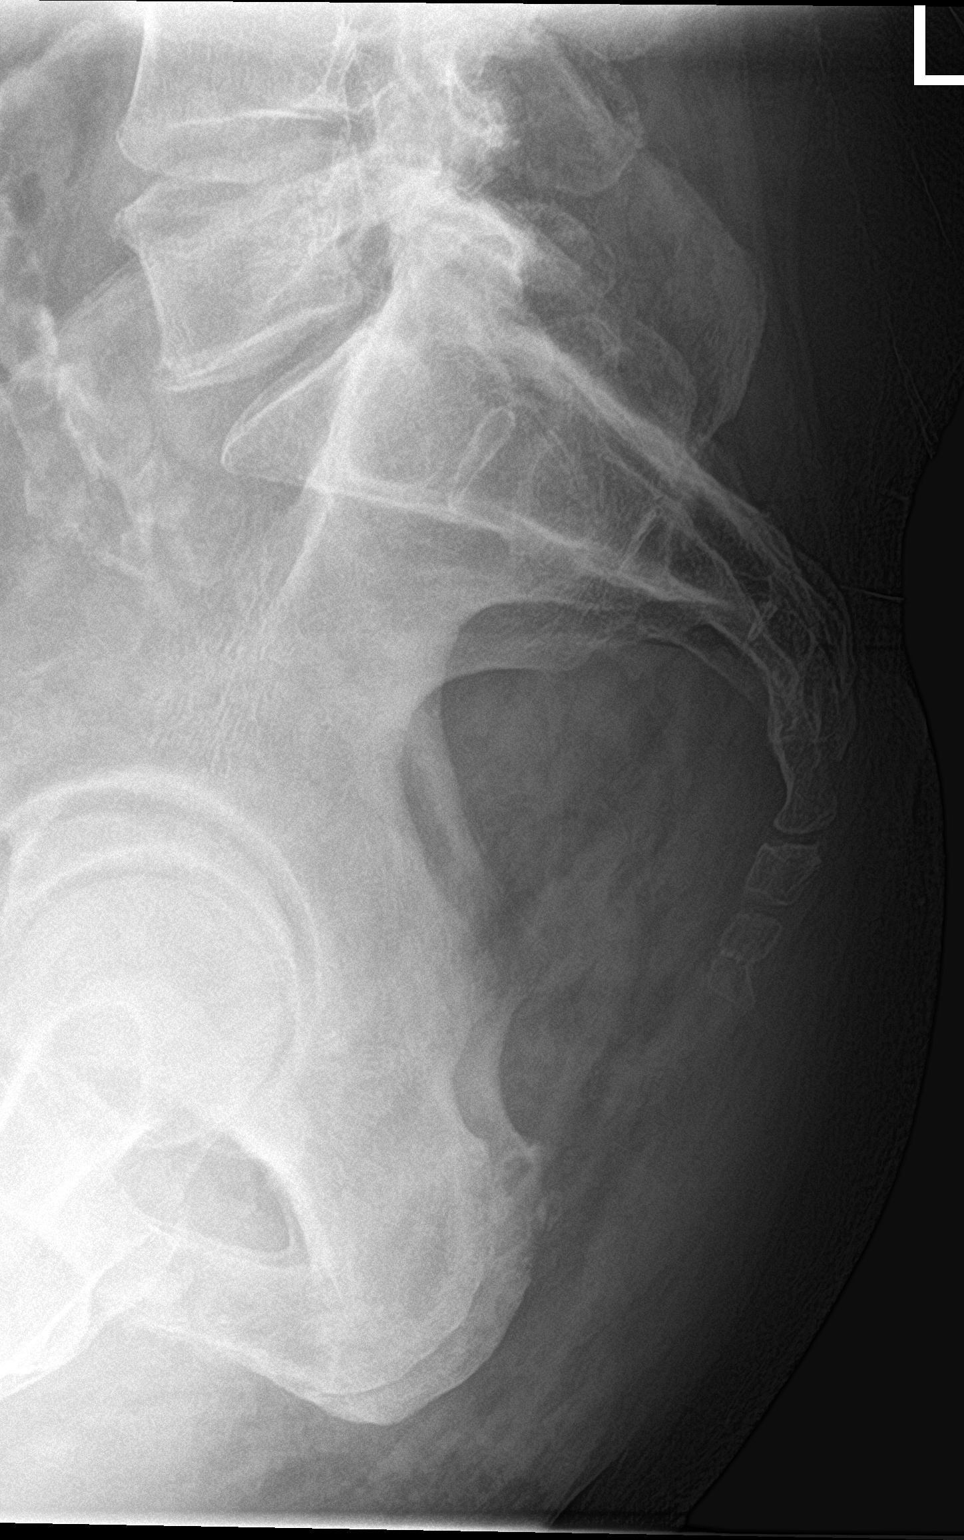

[3 of 3 positions shown; findings below may reference images not displayed]

FINDINGS: There is no evidence of fracture or other focal bone lesions. SI
joints and hip joints are symmetric and unremarkable.
IMPRESSION: Negative.

## 2018-09-30 DIAGNOSIS — N2581 Secondary hyperparathyroidism of renal origin: Secondary | ICD-10-CM | POA: Diagnosis not present

## 2018-09-30 DIAGNOSIS — I129 Hypertensive chronic kidney disease with stage 1 through stage 4 chronic kidney disease, or unspecified chronic kidney disease: Secondary | ICD-10-CM | POA: Diagnosis not present

## 2018-09-30 DIAGNOSIS — N183 Chronic kidney disease, stage 3 (moderate): Secondary | ICD-10-CM | POA: Diagnosis not present

## 2018-09-30 DIAGNOSIS — Z940 Kidney transplant status: Secondary | ICD-10-CM | POA: Diagnosis not present

## 2018-09-30 DIAGNOSIS — E785 Hyperlipidemia, unspecified: Secondary | ICD-10-CM | POA: Diagnosis not present

## 2018-09-30 DIAGNOSIS — Z7901 Long term (current) use of anticoagulants: Secondary | ICD-10-CM | POA: Diagnosis not present

## 2018-09-30 DIAGNOSIS — M1A30X0 Chronic gout due to renal impairment, unspecified site, without tophus (tophi): Secondary | ICD-10-CM | POA: Diagnosis not present

## 2018-09-30 LAB — PROTIME-INR: Protime: 22.7 — AB (ref 10.0–13.8)

## 2018-09-30 LAB — CBC AND DIFFERENTIAL
HCT: 40 — AB (ref 41–53)
Hemoglobin: 13.4 — AB (ref 13.5–17.5)
Neutrophils Absolute: 6
Platelets: 171 (ref 150–399)
WBC: 7.2

## 2018-09-30 LAB — POCT INR: INR: 2.3 — AB (ref 0.9–1.1)

## 2018-09-30 LAB — BASIC METABOLIC PANEL
BUN: 33 — AB (ref 4–21)
Creatinine: 1.4 — AB (ref 0.6–1.3)
Glucose: 118
Potassium: 4.8 (ref 3.4–5.3)
Sodium: 135 — AB (ref 137–147)

## 2018-09-30 LAB — LIPID PANEL
Cholesterol: 145 (ref 0–200)
HDL: 38 (ref 35–70)
LDL Cholesterol: 81
Triglycerides: 130 (ref 40–160)

## 2018-09-30 LAB — HEPATIC FUNCTION PANEL
ALT: 20 (ref 10–40)
AST: 24 (ref 14–40)
Alkaline Phosphatase: 117 (ref 25–125)
Bilirubin, Total: 0.6

## 2018-10-03 ENCOUNTER — Encounter: Payer: Self-pay | Admitting: Family Medicine

## 2018-10-14 DIAGNOSIS — Z7901 Long term (current) use of anticoagulants: Secondary | ICD-10-CM | POA: Diagnosis not present

## 2018-10-22 ENCOUNTER — Telehealth: Payer: Self-pay

## 2018-10-22 NOTE — Telephone Encounter (Signed)
I called and spoke with patient, he is ok with switching to a video visit. Changes made.     Virtual Visit Pre-Appointment Phone Call  "(Name), I am calling you today to discuss your upcoming appointment. We are currently trying to limit exposure to the virus that causes COVID-19 by seeing patients at home rather than in the office."  1. "What is the BEST phone number to call the day of the visit?" - include this in appointment notes  2. "Do you have or have access to (through a family member/friend) a smartphone with video capability that we can use for your visit?" a. If yes - list this number in appt notes as "cell" (if different from BEST phone #) and list the appointment type as a VIDEO visit in appointment notes b. If no - list the appointment type as a PHONE visit in appointment notes  3. Confirm consent - "In the setting of the current Covid19 crisis, you are scheduled for a (phone or video) visit with your provider on (date) at (time).  Just as we do with many in-office visits, in order for you to participate in this visit, we must obtain consent.  If you'd like, I can send this to your mychart (if signed up) or email for you to review.  Otherwise, I can obtain your verbal consent now.  All virtual visits are billed to your insurance company just like a normal visit would be.  By agreeing to a virtual visit, we'd like you to understand that the technology does not allow for your provider to perform an examination, and thus may limit your provider's ability to fully assess your condition. If your provider identifies any concerns that need to be evaluated in person, we will make arrangements to do so.  Finally, though the technology is pretty good, we cannot assure that it will always work on either your or our end, and in the setting of a video visit, we may have to convert it to a phone-only visit.  In either situation, we cannot ensure that we have a secure connection.  Are you willing to  proceed?" STAFF: Did the patient verbally acknowledge consent to telehealth visit? Document YES/NO here: YES  4. Advise patient to be prepared - "Two hours prior to your appointment, go ahead and check your blood pressure, pulse, oxygen saturation, and your weight (if you have the equipment to check those) and write them all down. When your visit starts, your provider will ask you for this information. If you have an Apple Watch or Kardia device, please plan to have heart rate information ready on the day of your appointment. Please have a pen and paper handy nearby the day of the visit as well."  5. Give patient instructions for MyChart download to smartphone OR [email or web address] as below if video visit (depending on what platform provider is using)  6. Inform patient they will receive a phone call 15 minutes prior to their appointment time (may be from unknown caller ID) so they should be prepared to answer    TELEPHONE CALL NOTE  ETHRIDGE SOLLENBERGER has been deemed a candidate for a follow-up tele-health visit to limit community exposure during the Covid-19 pandemic. I spoke with the patient via phone to ensure availability of phone/video source, confirm preferred email & phone number, and discuss instructions and expectations.  I reminded John Barton to be prepared with any vital sign and/or heart rhythm information that could potentially be obtained via home  monitoring, at the time of his visit. I reminded John Barton to expect a phone call prior to his visit.  Mady Haagensen, [place] 10/22/2018 4:57 PM   INSTRUCTIONS FOR DOWNLOADING THE MYCHART APP TO SMARTPHONE  - The patient must first make sure to have activated MyChart and know their login information - If Apple, go to [employer] and type in MyChart in the search bar and download the app. If Android, ask patient to go to [employer] and type in [place] in the search bar and download the app. The app is free but as with any other app  downloads, their phone may require them to verify saved payment information or Apple/Android password.  - The patient will need to then log into the app with their MyChart username and password, and select [facility] as their healthcare provider to link the account. When it is time for your visit, go to the MyChart app, find appointments, and click Begin Video Visit. Be sure to Select Allow for your device to access the Microphone and Camera for your visit. You will then be connected, and your provider will be with you shortly.  **If they have any issues connecting, or need assistance please contact MyChart service desk [phone])**  **If using a computer, in order to ensure the best quality for their visit they will need to use either of the following Internet Browsers: [employer], or Google Chrome**  IF USING DOXIMITY or DOXY.ME - The patient will receive a link just prior to their visit by text.     FULL LENGTH CONSENT FOR TELE-HEALTH VISIT   I hereby voluntarily request, consent and authorize [facility] and its employed or contracted physicians, physician assistants, nurse practitioners or other licensed health care professionals (the Practitioner), to provide me with telemedicine health care services (the "Services") as deemed necessary by the treating Practitioner. I acknowledge and consent to receive the Services by the Practitioner via telemedicine. I understand that the telemedicine visit will involve communicating with the Practitioner through live audiovisual communication technology and the disclosure of certain medical information by electronic transmission. I acknowledge that I have been given the opportunity to request an in-person assessment or other available alternative prior to the telemedicine visit and am voluntarily participating in the telemedicine visit.  I understand that I have the right to withhold or withdraw my consent to the use of telemedicine  in the course of my care at any time, without affecting my right to future care or treatment, and that the Practitioner or I may terminate the telemedicine visit at any time. I understand that I have the right to inspect all information obtained and/or recorded in the course of the telemedicine visit and may receive copies of available information for a reasonable fee.  I understand that some of the potential risks of receiving the Services via telemedicine include:  Marland Kitchen Delay or interruption in medical evaluation due to technological equipment failure or disruption; . Information transmitted may not be sufficient (e.g. poor resolution of images) to allow for appropriate medical decision making by the Practitioner; and/or  . In rare instances, security protocols could fail, causing a breach of personal health information.  Furthermore, I acknowledge that it is my responsibility to provide information about my medical history, conditions and care that is complete and accurate to the best of my ability. I acknowledge that Practitioner's advice, recommendations, and/or decision may be based on factors not within their control, such as incomplete  or inaccurate data provided by me or distortions of diagnostic images or specimens that may result from electronic transmissions. I understand that the practice of medicine is not an exact science and that Practitioner makes no warranties or guarantees regarding treatment outcomes. I acknowledge that I will receive a copy of this consent concurrently upon execution via email to the email address I last provided but may also request a printed copy by calling the office of [facility].    I understand that my insurance will be billed for this visit.   I have read or had this consent read to me. . I understand the contents of this consent, which adequately explains the benefits and risks of the Services being provided via telemedicine.  . I have been provided ample  opportunity to ask questions regarding this consent and the Services and have had my questions answered to my satisfaction. . I give my informed consent for the services to be provided through the use of telemedicine in my medical care  By participating in this telemedicine visit I agree to the above.

## 2018-10-27 ENCOUNTER — Telehealth (INDEPENDENT_AMBULATORY_CARE_PROVIDER_SITE_OTHER): Payer: Medicare Other | Admitting: Internal Medicine

## 2018-10-27 ENCOUNTER — Encounter: Payer: Self-pay | Admitting: Internal Medicine

## 2018-10-27 VITALS — BP 128/78 | HR 72 | Ht 70.0 in | Wt 191.0 lb

## 2018-10-27 DIAGNOSIS — I428 Other cardiomyopathies: Secondary | ICD-10-CM | POA: Diagnosis not present

## 2018-10-27 DIAGNOSIS — I493 Ventricular premature depolarization: Secondary | ICD-10-CM

## 2018-10-27 DIAGNOSIS — R55 Syncope and collapse: Secondary | ICD-10-CM | POA: Diagnosis not present

## 2018-10-27 NOTE — Progress Notes (Signed)
Electrophysiology TeleHealth Note   Due to national recommendations of social distancing due to COVID 19, an audio/video telehealth visit is felt to be most appropriate for this patient at this time.  See MyChart message from today for the patient's consent to telehealth for [facility].   Date:  10/27/2018   ID:  John Barton, DOB Jul 18, 1945, MRN [id number]  Location: patient's home  Provider location: [address], [place]  Evaluation Performed: Follow-up visit  PCP:  Tammi Sou, MD  Cardiologist:     Electrophysiologist:  SK   Chief Complaint:  Palpitations and PVCs   History of Present Illness:    John Barton is a 73 y.o. male who presents via audio/video conferencing for a telehealth visit today.  Since last being seen in our clinic for syncope with freq PVCs and hx of bifascicular block in setting of mechanical AVR and prior kidney transplant the patient reports feeling good   Hgb Cr    4/19 1.3 4.7 13.5   4/20 "Normal"     Holter previously reviewed and demonstrated bigeminy which was likely the explanation for the previously described bradycardia Event Recorder personnally reviewed  3/19>> PVCs and VTNS but no quantification on BIOTEL monitor.   DATE TEST EF   2/14 Echo   55 %   2 /18 Echo   45-50 %   5/19 Echo  45-50%    The patient denies chest pain, shortness of breath, nocturnal dyspnea, orthopnea or peripheral edema.  There have been no palpitations, lightheadedness or syncope.    The patient denies symptoms of fevers, chills, cough, or new SOB worrisome for COVID 19.    Past Medical History:  Diagnosis Date  . Arthritis   . Bifascicular block    First degree AV block, right bundle branch block left anterior fascicular block.  Re-eval with event monitor 07/2017--cardiology  . Blood transfusion without reported diagnosis   . BPH with obstruction/lower urinary tract symptoms   . Carotid stenosis   . Chronic gout    due to renal impairment  . 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 Jimmy Footman.  GFR stable at 55 ml/min (Cr 1.28) 07/16/17.  Neph f/u 08/08/17, Cr up to 1.71, GFR 39 (in context of vol ovld & imp bladder emptying b/c out of flomax x 1 wk).  Cr  1.38 and GFR 50 ml/min as of 08/21/17 renal f/u.  Cr 1.27, GFR 56 on renal f/u 10/01/17. Cr 1.24, GFR 57 on 01/06/18. Cr 1.41, GFR 49 Jan 2020. Stab GFR 09/2018   . COPD (chronic obstructive pulmonary disease) ([place])   . Coronary artery disease    CABG 1995.  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 Diabetes mellitus without complication ([place]) 13/1438   New dx 10/15/16 by A1c criteria (6.6%)--recommended nutritionist referral/no meds at initial dx.  . Glaucoma   . H/O paroxysmal supraventricular tachycardia    with wide complex. + Hx of a-fib as well.  Coumadin mgmt --nephrology  . History of renal transplant 1988  . Hyperlipidemia   . Hypertension   . ICD (implantable cardioverter-defibrillator) in place    Normal device check 01/28/16.  . Ischemic cardiomyopathy    Ischemic heart disease and mild depressed LV and RV function + DD and mild pulm HTN  . Post-transplant erythrocytosis   . 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  //  Echo 2/18: mild LVH, EF 45-50, diff HK, Gr 1 DD, severe MAC, mild Mitral Stenosis, normally functioning mechanical AVR (mean 12, peak 25).  Per cards, repeat echo planned for 2020.  Marland Kitchen Secondary hyperparathyroidism ([place])    parathyroidectomy done; subsequently has hypoparathyroidism, takes vit D  . Sinus bradycardia   . Syncope    He is s/p loop recorder insertion; as of Dr. Olin Pia f/u 01/16/16 he has had no further syncope and no significant arrhythmias.  He elected to keep the loop recorder in and was told to f/u 1 yr with Dr. Caryl Comes.   NORMAL DEVICE REMOTE TRANSMISSION--NO ATRIAL  FIBRILLATION--as of 09/02/15.    Past Surgical History:  Procedure Laterality Date  . AORTIC VALVE REPLACEMENT  1995   [facility] mechanical valve  . CARDIAC CATHETERIZATION  1995  . CARDIAC EVENT MONITOR  07/2017   PVCs, sinus brady---no significant abnormal findings.  . CORONARY ARTERY BYPASS GRAFT  1995   RCA andLAD  . ELECTROPHYSIOLOGY STUDY N/A 09/11/2012   Procedure: ELECTROPHYSIOLOGY STUDY;  Surgeon: Deboraha Sprang, MD;  Location: [facility];  Service: Cardiovascular;  Laterality: N/A;  . 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 parathyroidectomy    . TRANSTHORACIC ECHOCARDIOGRAM  08/15/12; 08/03/16; 11/04/17   2014: Mild LVH and LV dilation, EF 50-55%, normal aortic valve gradients.  2018: EF 45-50%, normal functioning prosthetic AV, diffuse hypokinesis, grd I DD, mild mitral stenosis.  10/2017: EF 40-45%, global hypokinesis, grd II DD, mild MR, severe LAE, mild RV dysfxn, mild inc pulm press.    Current Outpatient Medications  Medication Sig Dispense Refill  . allopurinol (ZYLOPRIM) 100 MG tablet 1 tablet by mouth in the morning 2 tablets by mouth at night    . atorvastatin (LIPITOR) 80 MG tablet Take 1 tablet (80 mg total) by mouth daily. 90 tablet 3  . calcitRIOL (ROCALTROL) 0.5 MCG capsule Take 0.5 mcg by mouth daily. Take two tablets by mouth on Monday,wed,fri, sun. Take one tablet by mouth tues, thurs and sat.    . furosemide (LASIX) 40 MG tablet Take 40 mg by mouth daily.    Marland Kitchen lisinopril (PRINIVIL,ZESTRIL) 5 MG tablet Take 5 mg by mouth daily.    . Multiple Vitamins-Minerals (OCUVITE PO) Take 1 tablet by mouth 2 (two) times a day.     . mycophenolate (CELLCEPT) 500 MG tablet Take 1,000 mg by mouth 2 (two) times daily.     . Naproxen Sodium (ALEVE PO) Take 1 tablet by mouth daily as needed (pain).    . tamsulosin (FLOMAX) 0.4 MG CAPS capsule Take 0.4 mg by mouth daily.     Marland Kitchen warfarin  (COUMADIN) 2 MG tablet Take 2 mg by mouth. 7.5 mg M,W, F. All other days 12m    . warfarin (COUMADIN) 5 MG tablet TAKE 1 (ONE) TABLET AS DIRECTED  10   No current facility-administered medications for this visit.     Allergies:   Patient has no known allergies.   Social History:  The patient  reports that he quit smoking about 4 years ago. His smoking use included cigars. He has never used smokeless tobacco. He reports current alcohol use of about 14.0 standard drinks of alcohol per week. He reports that he does not use drugs.   Family History:  The patient's   family history includes Alcohol abuse in his brother; Arthritis in  his mother; Diabetes in his daughter and father; Heart disease in his brother, father, and mother; Heart failure in his father and mother; Hyperlipidemia in his father and mother; Hypertension in his father and mother; Liver disease in his brother.   ROS:  Please see the history of present illness.   All other systems are personally reviewed and negative.    Exam:    Vital Signs:  BP 128/78   Pulse 72   Ht _0  (1.778 m)   Wt 191 lb (86.6 kg)   BMI 27.41 kg/m     Well appearing, alert and conversant, regular work of breathing,  good skin color Eyes- anicteric, neuro- grossly intact, skin- no apparent rash or lesions or cyanosis, mouth- oral mucosa is pink   Labs/Other Tests and Data Reviewed:    Recent Labs: 09/30/2018: ALT 20; BUN 33; Creatinine 1.4; Hemoglobin 13.4; Platelets 171; Potassium 4.8; Sodium 135   Wt Readings from Last 3 Encounters:  10/27/18 191 lb (86.6 kg)  05/20/18 189 lb 6 oz (85.9 kg)  05/05/18 198 lb 9.6 oz (90.1 kg)     Other studies personally reviewed: Additional studies/ records that were reviewed today include  As above     ASSESSMENT & PLAN:    Syncope  Wide-complex tachycardia  PVCs w functional bradycardia    Loop recorder insertion  Ischemic heart disease and depressed left ventricular function  Aortic  valve replacement-mechanical  Kidney transplant  Sinus node dysfunction  Bifascicular block  Without complaints  Euvolemic continue current meds  onlly on coumadin for AoV   dont know why not on ASA, so have reached out to DR EG and JD to make sure no contraindications to ASA  While PVC burden remains, albeit unquantified, in the absence of symptoms or worsening LV function, will continue current course.  Appropriate to continue ACE w low threshold to add BB      COVID 19 screen The patient denies symptoms of COVID 19 at this time.  The importance of social distancing was discussed today.  Follow-up: 12 m    Current medicines are reviewed at length with the patient today.   The patient does not have concerns regarding his medicines.  The following changes were made today:   Need to address need for ASA as per guideline  Labs/ tests ordered today include:   No orders of the defined types were placed in this encounter.   Future tests ( post COVID )     Patient Risk:  after full review of this patients clinical status, I feel that they are at moderate* risk at this time.  Today, I have spent  11  minutes with the patient with telehealth technology discussing the above.  Signed, Virl Axe, MD  10/27/2018 2:14 PM     [place] [address] [place] [address] [phone] (office) [phone] (fax)

## 2018-10-28 DIAGNOSIS — Z7901 Long term (current) use of anticoagulants: Secondary | ICD-10-CM | POA: Diagnosis not present

## 2018-11-03 ENCOUNTER — Telehealth: Payer: Self-pay

## 2018-11-03 MED ORDER — ASPIRIN EC 81 MG PO TBEC: 81.0000 mg | DELAYED_RELEASE_TABLET | Freq: Every day | ORAL | 3 refills | Status: AC

## 2018-11-03 NOTE — Telephone Encounter (Signed)
-----   Message from Deboraha Sprang, MD sent at 10/31/2018  5:23 PM EDT ----- L  could you plz call pt and tell him spoke with deterding and gerheardt and both agreed ok to go on ASA 81 as per guidelines for his AoV mechanical replacement  Thanks steve

## 2018-11-03 NOTE — Telephone Encounter (Signed)
Spoke with pt regarding recommendations from Dr's Caryl Comes, Deterding, and [place]. He agrees to begin 81 mg ASA qd. Pt's medication list has been updated.

## 2018-11-11 DIAGNOSIS — Z7901 Long term (current) use of anticoagulants: Secondary | ICD-10-CM | POA: Diagnosis not present

## 2018-11-13 ENCOUNTER — Telehealth: Payer: Self-pay | Admitting: Family Medicine

## 2018-11-13 DIAGNOSIS — S5001XA Contusion of right elbow, initial encounter: Secondary | ICD-10-CM | POA: Diagnosis not present

## 2018-11-13 NOTE — Telephone Encounter (Signed)
Noted  

## 2018-11-13 NOTE — Telephone Encounter (Signed)
Patient walked into office to be seen. Hurt is arm yesterday. His right arm is swollen and red. He has tried ice packs and heating pad.   Please advise and contact.

## 2018-11-13 NOTE — Telephone Encounter (Signed)
Called patient - He states that on Monday or Tuesday he went to throw some things in the outside trash yesterday and the garage door was not open all the way and when he slung his arm up it hit the door.   There is no laceration, but it is swollen and painful.   Pt declines urgent care and I offered him an appointment at 9:40 tomorrow morning.  He declined and said he needed something later in the day. Patient has been scheduled for 2:40 VV with Dr. Anitra Lauth.

## 2018-11-14 ENCOUNTER — Ambulatory Visit: Payer: Medicare Other | Admitting: Family Medicine

## 2018-11-14 NOTE — Telephone Encounter (Signed)
Patient cancelled VV scheduled for today. He decided to go to Urgent Care yesterday afternoon.

## 2018-11-26 DIAGNOSIS — Z7901 Long term (current) use of anticoagulants: Secondary | ICD-10-CM | POA: Diagnosis not present

## 2018-12-08 DIAGNOSIS — H353134 Nonexudative age-related macular degeneration, bilateral, advanced atrophic with subfoveal involvement: Secondary | ICD-10-CM | POA: Diagnosis not present

## 2018-12-08 DIAGNOSIS — H26491 Other secondary cataract, right eye: Secondary | ICD-10-CM | POA: Diagnosis not present

## 2018-12-09 DIAGNOSIS — Z7901 Long term (current) use of anticoagulants: Secondary | ICD-10-CM | POA: Diagnosis not present

## 2018-12-31 DIAGNOSIS — Z940 Kidney transplant status: Secondary | ICD-10-CM | POA: Diagnosis not present

## 2018-12-31 DIAGNOSIS — M1A30X0 Chronic gout due to renal impairment, unspecified site, without tophus (tophi): Secondary | ICD-10-CM | POA: Diagnosis not present

## 2018-12-31 DIAGNOSIS — N2581 Secondary hyperparathyroidism of renal origin: Secondary | ICD-10-CM | POA: Diagnosis not present

## 2018-12-31 DIAGNOSIS — N183 Chronic kidney disease, stage 3 (moderate): Secondary | ICD-10-CM | POA: Diagnosis not present

## 2018-12-31 DIAGNOSIS — Z7901 Long term (current) use of anticoagulants: Secondary | ICD-10-CM | POA: Diagnosis not present

## 2018-12-31 DIAGNOSIS — I129 Hypertensive chronic kidney disease with stage 1 through stage 4 chronic kidney disease, or unspecified chronic kidney disease: Secondary | ICD-10-CM | POA: Diagnosis not present

## 2018-12-31 LAB — CBC AND DIFFERENTIAL
HCT: 42 (ref 41–53)
Hemoglobin: 13.8 (ref 13.5–17.5)
Neutrophils Absolute: 6
Platelets: 166 (ref 150–399)
WBC: 7.5

## 2018-12-31 LAB — PROTIME-INR: Protime: 22.4 — AB (ref 10.0–13.8)

## 2018-12-31 LAB — HEPATIC FUNCTION PANEL
ALT: 21 (ref 10–40)
AST: 25 (ref 14–40)
Alkaline Phosphatase: 113 (ref 25–125)
Bilirubin, Total: 0.4

## 2018-12-31 LAB — BASIC METABOLIC PANEL
BUN: 22 — AB (ref 4–21)
Creatinine: 1.3 (ref 0.6–1.3)
Glucose: 141
Potassium: 4.5 (ref 3.4–5.3)
Sodium: 136 — AB (ref 137–147)

## 2018-12-31 LAB — POCT INR: INR: 2.1 — AB (ref 0.9–1.1)

## 2019-01-09 ENCOUNTER — Encounter: Payer: Self-pay | Admitting: Family Medicine

## 2019-01-13 DIAGNOSIS — Z7901 Long term (current) use of anticoagulants: Secondary | ICD-10-CM | POA: Diagnosis not present

## 2019-01-27 DIAGNOSIS — Z7901 Long term (current) use of anticoagulants: Secondary | ICD-10-CM | POA: Diagnosis not present

## 2019-02-06 ENCOUNTER — Telehealth: Payer: Self-pay

## 2019-02-06 NOTE — Telephone Encounter (Signed)
Called patient because he has not been seen in our office since 05/2018. He is overdue for a CMC. Patient stated "there was nothing wrong with him and he did not want to be seen". Patient became very frustrated with me and hung up.

## 2019-02-09 DIAGNOSIS — Z7901 Long term (current) use of anticoagulants: Secondary | ICD-10-CM | POA: Diagnosis not present

## 2019-02-24 DIAGNOSIS — Z7901 Long term (current) use of anticoagulants: Secondary | ICD-10-CM | POA: Diagnosis not present

## 2019-03-04 IMAGING — DX DG CHEST 2V
2 series · 2 of 2 positions shown · non-contrast
Comparison: 12/04/2008

CLINICAL DATA: Bronchitis, cough congestion

EXAM:
CHEST - 2 VIEW

[chest pa]
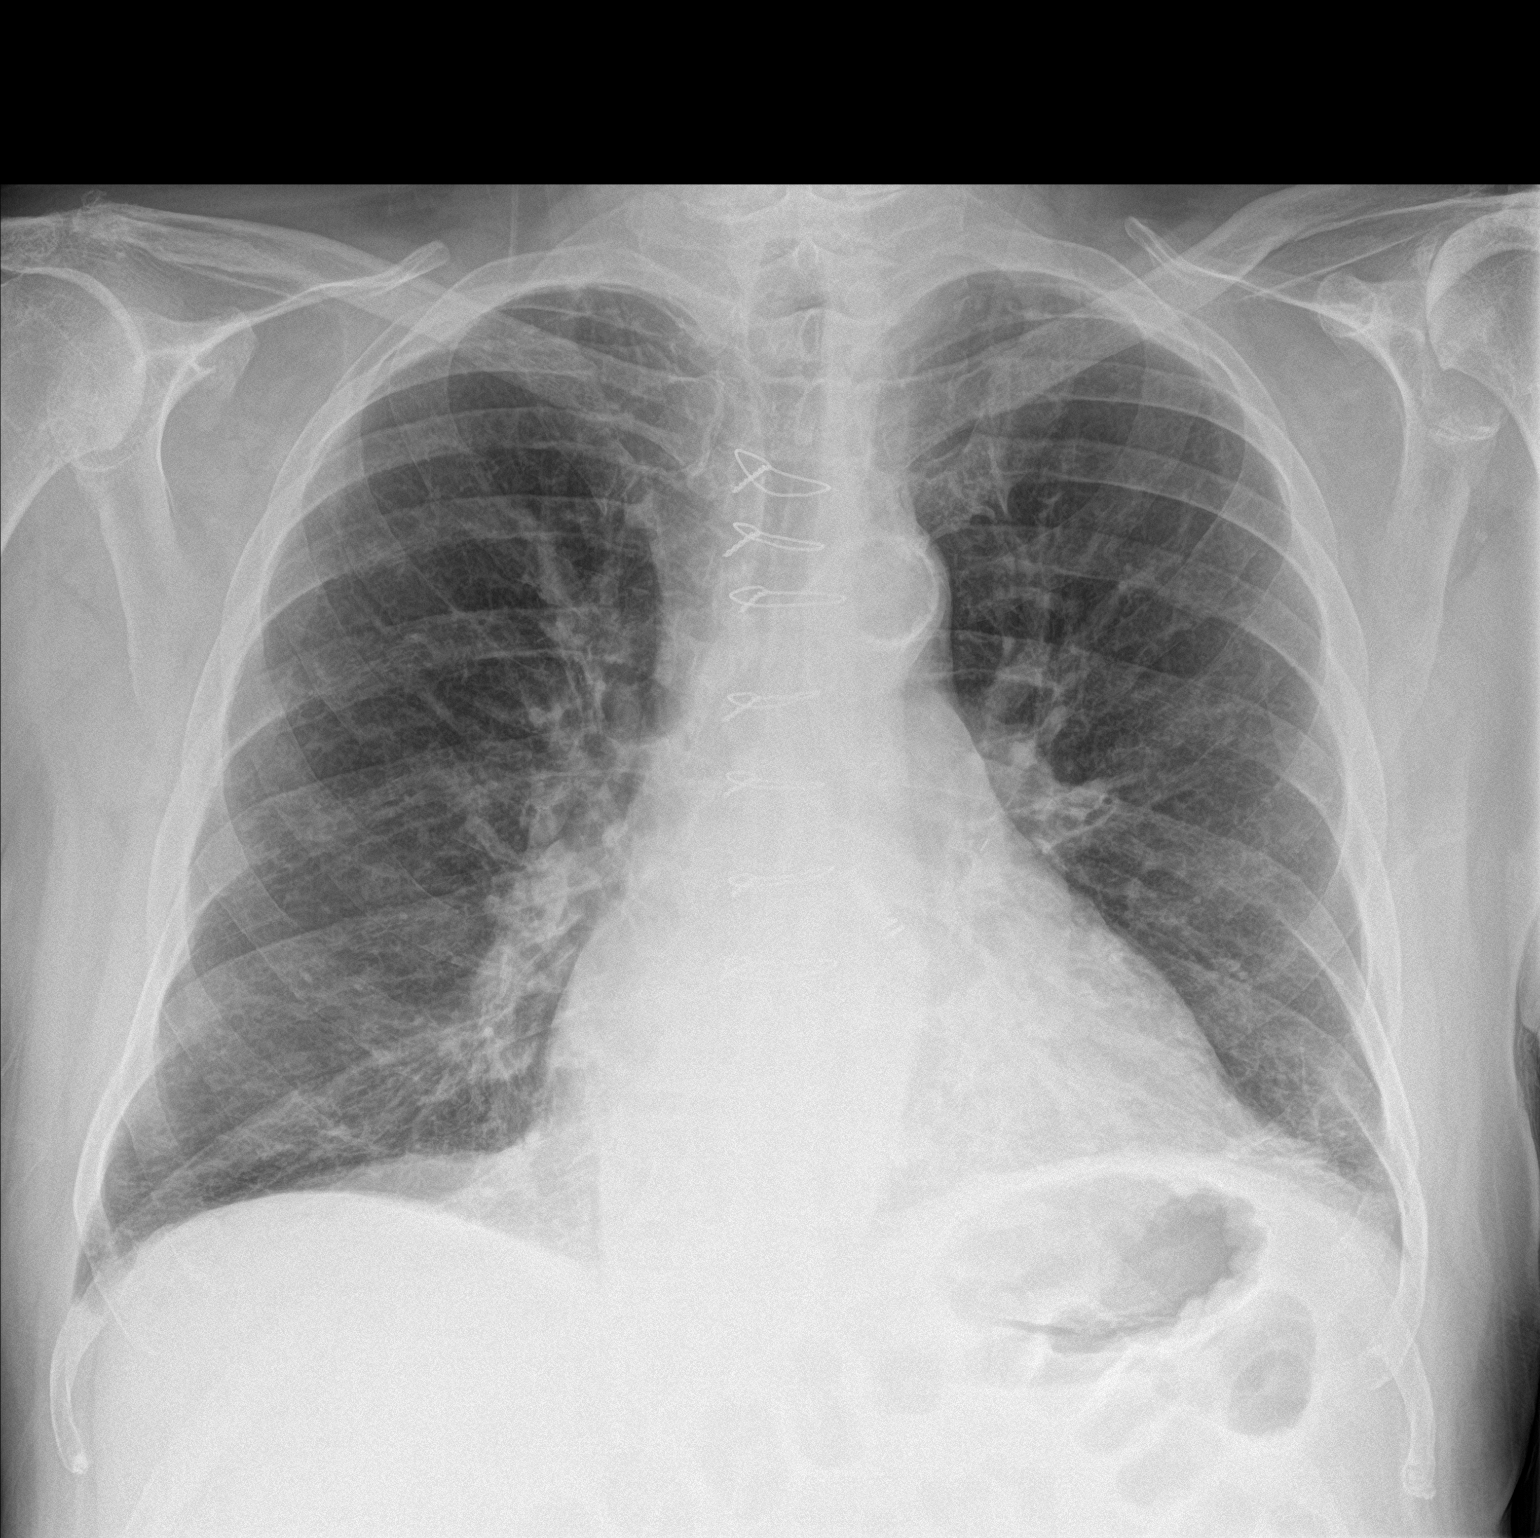

[chest lat]
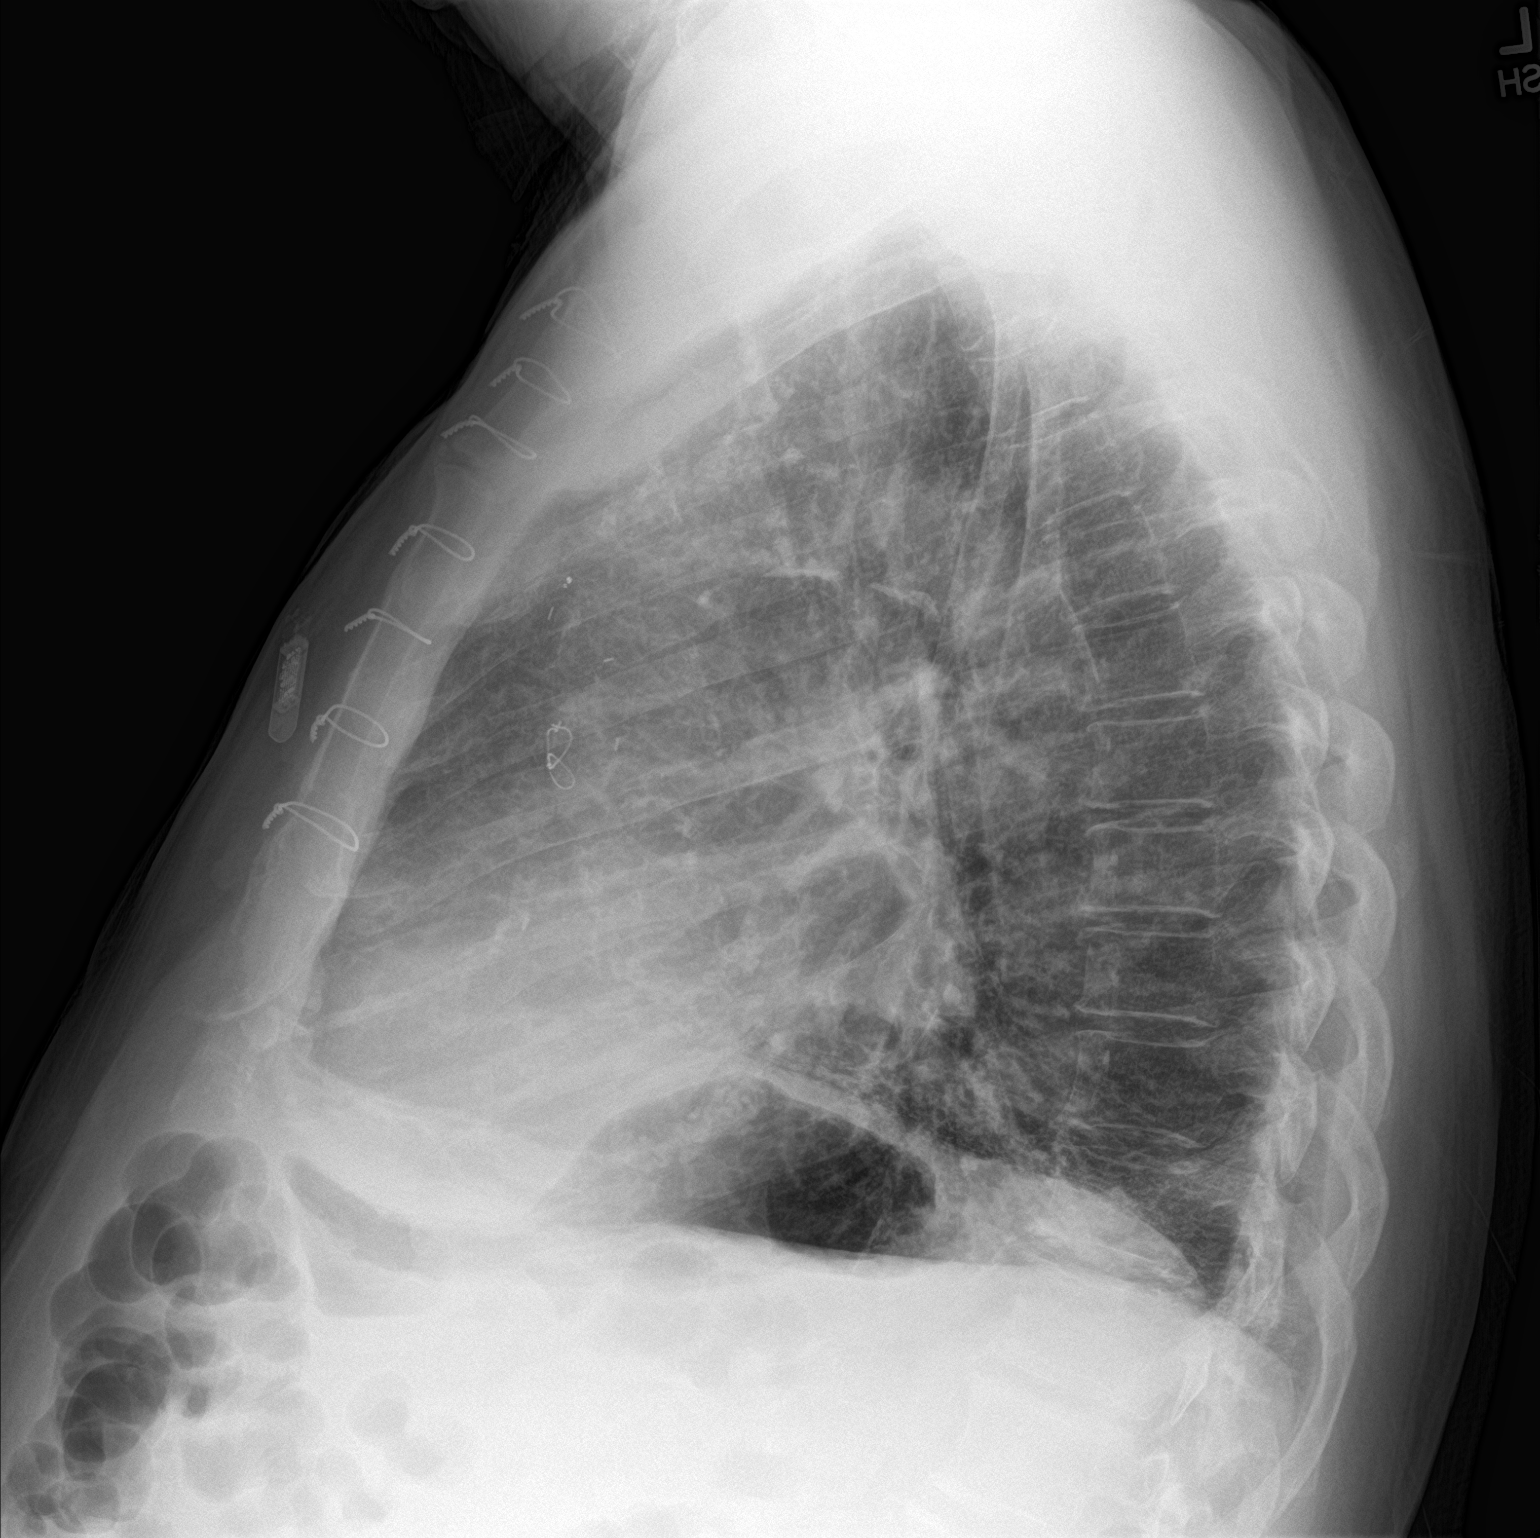

[2 of 2 positions shown; findings below may reference images not displayed]

FINDINGS: Postop CABG.  Heart size upper normal.  Atherosclerotic aortic arch.

Negative for edema or effusion. Bibasilar airspace disease could
represent atelectasis or pneumonia.
IMPRESSION: Mild bibasilar atelectasis/infiltrate.  Negative for heart failure.

## 2019-03-16 DIAGNOSIS — Z7901 Long term (current) use of anticoagulants: Secondary | ICD-10-CM | POA: Diagnosis not present

## 2019-03-26 NOTE — Progress Notes (Deleted)
CARDIOLOGY OFFICE NOTE  Date:  03/26/2019    John Barton Date of Birth: Nov 15, 1945 Medical Record #[id number]  PCP:  Tammi Sou, MD  Cardiologist:  Ree Shay   No chief complaint on file.   History of Present Illness: John Barton is a 73 y.o. male who presents today for a follow up visit.  Seen for Dr. Caryl Comes.   He has known CAD with remote CABG to the RCA and LAD as well as prior [place]. He had SVT in 2010.For reasons that are not particularly clear, he ended up on Quinaglute- no longer on.He hadhad a loop in place for syncope - it reached ERI - was elected to leave in. Other issues include prior kidney transplantwith CKD stage III- sees Dr. Jimmy Footman. Haschronicbifascicular block.  Prior echo 07/2016 showed LVEF of 45-50% (stable), diffuse hypokinesis, grade 1 DD,s/p AVR with normal mean gradient and no AI; severe MAC with mild MS and trace MR. Not symptomatic - on ACE - no beta blocker due to bifascicular block.  I last saw him in January of 2019 and he was doing well.  Seen as a work in by [employer] in February of 2019 - had some episodic shortness of breath and concern for low HR - had event monitor - no significant bradycardia noted. Had his echo updated in May of 2019 - EF 40 to 45%. He has continued to leave his ILR in place despite being at [facility]. He has not had recurrent syncope.   I last saw him in July of 2019 - he was doing ok. Saw Dr. Caryl Comes back in May for a telehealth visit -   The patient {does/does not:200015} have symptoms concerning for COVID-19 infection (fever, chills, cough, or new shortness of breath).   Comes in today. Here with   Past Medical History:  Diagnosis Date  . Arthritis   . Bifascicular block    First degree AV block, right bundle branch block left anterior fascicular block.  Re-eval with event monitor 07/2017--cardiology  . Blood transfusion without reported diagnosis   . BPH with obstruction/lower  urinary tract symptoms   . Carotid stenosis   . Chronic gout    due to renal impairment  . 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 Jimmy Footman.  GFR stable at 55 ml/min (Cr 1.28) 07/16/17.  Neph f/u 08/08/17, Cr up to 1.71, GFR 39 (in context of vol ovld & imp bladder emptying b/c out of flomax x 1 wk).  Cr  1.38 and GFR 50 ml/min as of 08/21/17 renal f/u.  Cr 1.27, GFR 56 on renal f/u 10/01/17. Cr 1.24, GFR 57 on 01/06/18. Cr 1.41, GFR 49 Jan 2020. Stab GFR 09/2018   . COPD (chronic obstructive pulmonary disease) ([place])   . Coronary artery disease    CABG 1995.  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 Diabetes mellitus without complication ([place]) 74/1287   New dx 10/15/16 by A1c criteria (6.6%)--recommended nutritionist referral/no meds at initial dx.  . Glaucoma   . H/O paroxysmal supraventricular tachycardia    with wide complex. + Hx of a-fib as well.  Coumadin mgmt --nephrology  . History of renal transplant 1988  . Hyperlipidemia   . Hypertension   . ICD (implantable cardioverter-defibrillator) in place    Normal device check 01/28/16.  . Ischemic cardiomyopathy    Ischemic heart disease and mild  depressed LV and RV function + DD and mild pulm HTN  . Post-transplant erythrocytosis   . 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  //  Echo 2/18: mild LVH, EF 45-50, diff HK, Gr 1 DD, severe MAC, mild Mitral Stenosis, normally functioning mechanical AVR (mean 12, peak 25).  Per cards, repeat echo planned for 2020.  Marland Kitchen Secondary hyperparathyroidism ([place])    parathyroidectomy done; subsequently has hypoparathyroidism, takes vit D  . Sinus bradycardia   . Syncope    He is s/p loop recorder insertion; as of Dr. Olin Pia f/u 01/16/16 he has had no further syncope and no significant arrhythmias.  He elected to keep the loop recorder in and was told to f/u  1 yr with Dr. Caryl Comes.   NORMAL DEVICE REMOTE TRANSMISSION--NO ATRIAL FIBRILLATION--as of 09/02/15.    Past Surgical History:  Procedure Laterality Date  . AORTIC VALVE REPLACEMENT  1995   ST.Jude mechanical valve  . CARDIAC CATHETERIZATION  1995  . CARDIAC EVENT MONITOR  07/2017   PVCs, sinus brady---no significant abnormal findings.  . CORONARY ARTERY BYPASS GRAFT  1995   RCA andLAD  . ELECTROPHYSIOLOGY STUDY N/A 09/11/2012   Procedure: ELECTROPHYSIOLOGY STUDY;  Surgeon: Deboraha Sprang, MD;  Location: [facility] CATH LAB;  Service: Cardiovascular;  Laterality: N/A;  . 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 parathyroidectomy    . TRANSTHORACIC ECHOCARDIOGRAM  08/15/12; 08/03/16; 11/04/17   2014: Mild LVH and LV dilation, EF 50-55%, normal aortic valve gradients.  2018: EF 45-50%, normal functioning prosthetic AV, diffuse hypokinesis, grd I DD, mild mitral stenosis.  10/2017: EF 40-45%, global hypokinesis, grd II DD, mild MR, severe LAE, mild RV dysfxn, mild inc pulm press.     Medications: No outpatient medications have been marked as taking for the 03/31/19 encounter (Appointment) with Burtis Junes, NP.     Allergies: No Known Allergies  Social History: The patient  reports that he quit smoking about 4 years ago. His smoking use included cigars. He has never used smokeless tobacco. He reports current alcohol use of about 14.0 standard drinks of alcohol per week. He reports that he does not use drugs.   Family History: The patient's ***family history includes Alcohol abuse in his brother; Arthritis in his mother; Diabetes in his daughter and father; Heart disease in his brother, father, and mother; Heart failure in his father and mother; Hyperlipidemia in his father and mother; Hypertension in his father and mother; Liver disease in his brother.   Review of Systems: Please see the history of  present illness.   All other systems are reviewed and negative.   Physical Exam: VS:  There were no vitals taken for this visit. Marland Kitchen  BMI There is no height or weight on file to calculate BMI.  Wt Readings from Last 3 Encounters:  10/27/18 191 lb (86.6 kg)  05/20/18 189 lb 6 oz (85.9 kg)  05/05/18 198 lb 9.6 oz (90.1 kg)    General: Pleasant. Well developed, well nourished and in no acute distress.   HEENT: Normal.  Neck: Supple, no JVD, carotid bruits, or masses noted.  Cardiac: ***Regular rate and rhythm. No murmurs, rubs, or gallops. No edema.  Respiratory:  Lungs are clear to auscultation bilaterally with normal work of breathing.  GI: Soft and nontender.  MS: No deformity or atrophy. Gait and ROM intact.  Skin:  Warm and dry. Color is normal.  Neuro:  Strength and sensation are intact and no gross focal deficits noted.  Psych: Alert, appropriate and with normal affect.   LABORATORY DATA:  EKG:  EKG {ACTION; IS/IS UEK:80034917} ordered today. This demonstrates ***.  Lab Results  Component Value Date   WBC 7.5 12/31/2018   HGB 13.8 12/31/2018   HCT 42 12/31/2018   PLT 166 12/31/2018   GLUCOSE 116 (H) 08/15/2017   CHOL 145 09/30/2018   TRIG 130 09/30/2018   HDL 38 09/30/2018   LDLCALC 81 09/30/2018   ALT 21 12/31/2018   AST 25 12/31/2018   NA 136 (A) 12/31/2018   K 4.5 12/31/2018   CL 100 08/15/2017   CREATININE 1.3 12/31/2018   BUN 22 (A) 12/31/2018   CO2 19 (L) 08/15/2017   TSH 2.62 10/15/2016   INR 2.1 (A) 12/31/2018   HGBA1C 6.1 04/22/2017     BNP (last 3 results) No results for input(s): BNP in the last 8760 hours.  ProBNP (last 3 results) No results for input(s): PROBNP in the last 8760 hours.   Other Studies Reviewed Today:  Echo Study Conclusions 10/2017 - Left ventricle: The cavity size was mildly dilated. Wall thickness was increased in a pattern of moderate LVH. Systolic function was mildly to moderately reduced. The estimated ejection  fraction was in the range of 40% to 45%. Diffuse hypokinesis. Features are consistent with a pseudonormal left ventricular filling pattern, with concomitant abnormal relaxation and increased filling pressure (grade 2 diastolic dysfunction). Doppler parameters are consistent with high ventricular filling pressure. - Aortic valve: A mechanical prosthesis was present. There was trivial regurgitation. - Aortic root: The aortic root was mildly dilated. - Mitral valve: Calcified annulus. There was mild regurgitation. - Left atrium: The atrium was severely dilated. - Right ventricle: The cavity size was mildly dilated. Systolic function was mildly reduced. - Pulmonary arteries: Systolic pressure was mildly increased.  Impressions:  - Mild to moderate global reduction in LV systolic function; moderate diastolic dysfunction; moderate LVH; s/p AVR with mean gradient of 10 mmHg and trace AI; mildly dilated aortic root; mild MR; severe LAE; mild RVE with reduced function; trace TR with mild pulmonary hypertension.   EchoStudy Conclusions2/2018  - Left ventricle: The cavity size was normal. Wall thickness was increased in a pattern of mild LVH. Systolic function was mildly reduced. The estimated ejection fraction was in the range of 45% to 50%. Diffuse hypokinesis. Doppler parameters are consistent with abnormal left ventricular relaxation (grade 1 diastolic dysfunction). Doppler parameters are consistent with high ventricular filling pressure. - Aortic valve: A mechanical prosthesis was present. - Mitral valve: Severely calcified annulus. The findings are consistent with mild stenosis. Valve area by pressure half-time: 1.58 cm^2. - Left atrium: The atrium was mildly dilated.  Impressions:  - Mild global reduction in LV systolic function; mild LVH; grade 1 diastolic dysfunction; elevated LV filling pressure; mild LAE; s/p AVR with  normal mean gradient and no AI; severe MAC with mild MS and trace MR;    MyoviewImpressionfrom 2014 Exercise Capacity: Lexiscan with low level exercise. BP Response: Normal blood pressure response. Clinical Symptoms: There is dyspnea. ECG Impression: No significant ST segment change suggestive of ischemia. Comparison with Prior Nuclear Study: No images to compare  Overall Impression: Low risk stress nuclear study with a large, severe, partially reversible inferior defect consistent with prior inferior infarct and minimal peri-infarct ischemia.  LV Ejection Fraction: 53%. LV Wall Motion: Inferior hypokinesis.  Aaron Edelman  [place]   Assessment/Plan:  1.Remote episode of syncope - has not recurred. Still with ILR in place - has declined removal. Not dizzy or lightheaded. Does have bifascicular block. No changes made today. He has had a basically negative event monitor - will continue to follow.   2. History ofWide-complex tachycardiaand sinus node dysfunction. No recurrent syncope or dizziness noted.   3. Underlying loop recorder -at ERI - he does not wish for it to be removed.   4.Ischemic heart disease and depressed left ventricular function- he continues to look compensated. Myoview from 2014 noted. He continues to do well clinically. I have left him on his current regimen.  On ACE. No beta blocker due to bifascicular block.  5. PriorAortic valve replacement St. Jude from 1995 - mechanical- on coumadin.Echo stable last year - will repeat in 2020.  6. History of kidney transplant- followed by nephrology.  7. Chronic coumadin therapy - monitored by nephrology. No obvious problems noted.   ASSESSMENT & PLAN:    Syncope  Wide-complex tachycardia  PVCs w functional bradycardia   Loop recorder insertion  Ischemic heart disease and depressed left ventricular function  Aortic valve replacement-mechanical  Kidney transplant   Sinus node dysfunction  Bifascicular block  Without complaints  Euvolemic continue current meds  onlly on coumadin for AoV   dont know why not on ASA, so have reached out to DR EG and JD to make sure no contraindications to ASA  While PVC burden remains, albeit unquantified, in the absence of symptoms or worsening LV function, will continue current course.  Appropriate to continue ACE w low threshold to add BB       . COVID-19 Education: The signs and symptoms of COVID-19 were discussed with the patient and how to seek care for testing (follow up with PCP or arrange E-visit).  The importance of social distancing, staying at home, hand hygiene and wearing a mask when out in public were discussed today.  Current medicines are reviewed with the patient today.  The patient does not have concerns regarding medicines other than what has been noted above.  The following changes have been made:  See above.  Labs/ tests ordered today include:   No orders of the defined types were placed in this encounter.    Disposition:   FU with *** in {gen number 1-38:871959} {Days to years:10300}.   Patient is agreeable to this plan and will call if any problems develop in the interim.   SignedTruitt Merle, NP  03/26/2019 8:04 PM  [facility] [address] [place], [place]  [address] Phone: [phone] Fax: [phone]

## 2019-03-27 ENCOUNTER — Emergency Department (HOSPITAL_COMMUNITY): Payer: Medicare Other

## 2019-03-27 ENCOUNTER — Encounter (HOSPITAL_COMMUNITY): Payer: Self-pay

## 2019-03-27 ENCOUNTER — Inpatient Hospital Stay (HOSPITAL_COMMUNITY)
Admission: EM | Admit: 2019-03-27 | Discharge: 2019-04-19 | DRG: 871 | Disposition: E | Payer: Medicare Other | Attending: Pulmonary Disease | Admitting: Pulmonary Disease

## 2019-03-27 DIAGNOSIS — R072 Precordial pain: Secondary | ICD-10-CM | POA: Diagnosis not present

## 2019-03-27 DIAGNOSIS — K429 Umbilical hernia without obstruction or gangrene: Secondary | ICD-10-CM | POA: Diagnosis not present

## 2019-03-27 DIAGNOSIS — I4891 Unspecified atrial fibrillation: Secondary | ICD-10-CM | POA: Diagnosis present

## 2019-03-27 DIAGNOSIS — N390 Urinary tract infection, site not specified: Secondary | ICD-10-CM | POA: Diagnosis present

## 2019-03-27 DIAGNOSIS — J9690 Respiratory failure, unspecified, unspecified whether with hypoxia or hypercapnia: Secondary | ICD-10-CM

## 2019-03-27 DIAGNOSIS — A419 Sepsis, unspecified organism: Secondary | ICD-10-CM | POA: Diagnosis not present

## 2019-03-27 DIAGNOSIS — R109 Unspecified abdominal pain: Secondary | ICD-10-CM

## 2019-03-27 DIAGNOSIS — N1832 Chronic kidney disease, stage 3b: Secondary | ICD-10-CM | POA: Diagnosis present

## 2019-03-27 DIAGNOSIS — I452 Bifascicular block: Secondary | ICD-10-CM | POA: Diagnosis not present

## 2019-03-27 DIAGNOSIS — D649 Anemia, unspecified: Secondary | ICD-10-CM | POA: Diagnosis not present

## 2019-03-27 DIAGNOSIS — E861 Hypovolemia: Secondary | ICD-10-CM | POA: Diagnosis present

## 2019-03-27 DIAGNOSIS — K661 Hemoperitoneum: Secondary | ICD-10-CM | POA: Diagnosis not present

## 2019-03-27 DIAGNOSIS — J9601 Acute respiratory failure with hypoxia: Secondary | ICD-10-CM | POA: Diagnosis not present

## 2019-03-27 DIAGNOSIS — T8619 Other complication of kidney transplant: Secondary | ICD-10-CM | POA: Diagnosis present

## 2019-03-27 DIAGNOSIS — R197 Diarrhea, unspecified: Secondary | ICD-10-CM | POA: Diagnosis not present

## 2019-03-27 DIAGNOSIS — E872 Acidosis: Secondary | ICD-10-CM | POA: Diagnosis present

## 2019-03-27 DIAGNOSIS — I444 Left anterior fascicular block: Secondary | ICD-10-CM | POA: Diagnosis present

## 2019-03-27 DIAGNOSIS — I509 Heart failure, unspecified: Secondary | ICD-10-CM | POA: Diagnosis not present

## 2019-03-27 DIAGNOSIS — I2510 Atherosclerotic heart disease of native coronary artery without angina pectoris: Secondary | ICD-10-CM | POA: Diagnosis present

## 2019-03-27 DIAGNOSIS — R0902 Hypoxemia: Secondary | ICD-10-CM

## 2019-03-27 DIAGNOSIS — D62 Acute posthemorrhagic anemia: Secondary | ICD-10-CM | POA: Diagnosis not present

## 2019-03-27 DIAGNOSIS — I495 Sick sinus syndrome: Secondary | ICD-10-CM | POA: Diagnosis present

## 2019-03-27 DIAGNOSIS — E669 Obesity, unspecified: Secondary | ICD-10-CM | POA: Diagnosis present

## 2019-03-27 DIAGNOSIS — R066 Hiccough: Secondary | ICD-10-CM | POA: Diagnosis not present

## 2019-03-27 DIAGNOSIS — Z940 Kidney transplant status: Secondary | ICD-10-CM | POA: Diagnosis not present

## 2019-03-27 DIAGNOSIS — I255 Ischemic cardiomyopathy: Secondary | ICD-10-CM | POA: Diagnosis present

## 2019-03-27 DIAGNOSIS — K8020 Calculus of gallbladder without cholecystitis without obstruction: Secondary | ICD-10-CM | POA: Diagnosis not present

## 2019-03-27 DIAGNOSIS — J440 Chronic obstructive pulmonary disease with acute lower respiratory infection: Secondary | ICD-10-CM | POA: Diagnosis not present

## 2019-03-27 DIAGNOSIS — Z515 Encounter for palliative care: Secondary | ICD-10-CM | POA: Diagnosis not present

## 2019-03-27 DIAGNOSIS — K5989 Other specified functional intestinal disorders: Secondary | ICD-10-CM | POA: Diagnosis not present

## 2019-03-27 DIAGNOSIS — R509 Fever, unspecified: Secondary | ICD-10-CM

## 2019-03-27 DIAGNOSIS — Z952 Presence of prosthetic heart valve: Secondary | ICD-10-CM

## 2019-03-27 DIAGNOSIS — I38 Endocarditis, valve unspecified: Secondary | ICD-10-CM | POA: Diagnosis present

## 2019-03-27 DIAGNOSIS — I5043 Acute on chronic combined systolic (congestive) and diastolic (congestive) heart failure: Secondary | ICD-10-CM | POA: Diagnosis not present

## 2019-03-27 DIAGNOSIS — Z951 Presence of aortocoronary bypass graft: Secondary | ICD-10-CM | POA: Diagnosis not present

## 2019-03-27 DIAGNOSIS — R578 Other shock: Secondary | ICD-10-CM | POA: Diagnosis not present

## 2019-03-27 DIAGNOSIS — Y830 Surgical operation with transplant of whole organ as the cause of abnormal reaction of the patient, or of later complication, without mention of misadventure at the time of the procedure: Secondary | ICD-10-CM | POA: Diagnosis present

## 2019-03-27 DIAGNOSIS — Z992 Dependence on renal dialysis: Secondary | ICD-10-CM | POA: Diagnosis not present

## 2019-03-27 DIAGNOSIS — N2581 Secondary hyperparathyroidism of renal origin: Secondary | ICD-10-CM | POA: Diagnosis not present

## 2019-03-27 DIAGNOSIS — I10 Essential (primary) hypertension: Secondary | ICD-10-CM | POA: Diagnosis not present

## 2019-03-27 DIAGNOSIS — J811 Chronic pulmonary edema: Secondary | ICD-10-CM | POA: Diagnosis not present

## 2019-03-27 DIAGNOSIS — R4189 Other symptoms and signs involving cognitive functions and awareness: Secondary | ICD-10-CM

## 2019-03-27 DIAGNOSIS — D849 Immunodeficiency, unspecified: Secondary | ICD-10-CM | POA: Diagnosis not present

## 2019-03-27 DIAGNOSIS — S2243XA Multiple fractures of ribs, bilateral, initial encounter for closed fracture: Secondary | ICD-10-CM | POA: Diagnosis not present

## 2019-03-27 DIAGNOSIS — J189 Pneumonia, unspecified organism: Secondary | ICD-10-CM | POA: Diagnosis present

## 2019-03-27 DIAGNOSIS — Y92239 Unspecified place in hospital as the place of occurrence of the external cause: Secondary | ICD-10-CM | POA: Diagnosis not present

## 2019-03-27 DIAGNOSIS — I330 Acute and subacute infective endocarditis: Secondary | ICD-10-CM | POA: Diagnosis not present

## 2019-03-27 DIAGNOSIS — X58XXXA Exposure to other specified factors, initial encounter: Secondary | ICD-10-CM | POA: Diagnosis not present

## 2019-03-27 DIAGNOSIS — I951 Orthostatic hypotension: Secondary | ICD-10-CM | POA: Diagnosis not present

## 2019-03-27 DIAGNOSIS — M7989 Other specified soft tissue disorders: Secondary | ICD-10-CM | POA: Diagnosis not present

## 2019-03-27 DIAGNOSIS — I468 Cardiac arrest due to other underlying condition: Secondary | ICD-10-CM | POA: Diagnosis not present

## 2019-03-27 DIAGNOSIS — E1122 Type 2 diabetes mellitus with diabetic chronic kidney disease: Secondary | ICD-10-CM | POA: Diagnosis present

## 2019-03-27 DIAGNOSIS — E119 Type 2 diabetes mellitus without complications: Secondary | ICD-10-CM | POA: Diagnosis not present

## 2019-03-27 DIAGNOSIS — Z20828 Contact with and (suspected) exposure to other viral communicable diseases: Secondary | ICD-10-CM | POA: Diagnosis present

## 2019-03-27 DIAGNOSIS — R319 Hematuria, unspecified: Secondary | ICD-10-CM | POA: Diagnosis not present

## 2019-03-27 DIAGNOSIS — R000 Tachycardia, unspecified: Secondary | ICD-10-CM | POA: Diagnosis not present

## 2019-03-27 DIAGNOSIS — Z79899 Other long term (current) drug therapy: Secondary | ICD-10-CM | POA: Diagnosis not present

## 2019-03-27 DIAGNOSIS — Z811 Family history of alcohol abuse and dependence: Secondary | ICD-10-CM

## 2019-03-27 DIAGNOSIS — R58 Hemorrhage, not elsewhere classified: Secondary | ICD-10-CM | POA: Diagnosis not present

## 2019-03-27 DIAGNOSIS — R7881 Bacteremia: Secondary | ICD-10-CM | POA: Diagnosis not present

## 2019-03-27 DIAGNOSIS — I6529 Occlusion and stenosis of unspecified carotid artery: Secondary | ICD-10-CM | POA: Diagnosis present

## 2019-03-27 DIAGNOSIS — I4510 Unspecified right bundle-branch block: Secondary | ICD-10-CM | POA: Diagnosis present

## 2019-03-27 DIAGNOSIS — N170 Acute kidney failure with tubular necrosis: Secondary | ICD-10-CM | POA: Diagnosis not present

## 2019-03-27 DIAGNOSIS — I130 Hypertensive heart and chronic kidney disease with heart failure and stage 1 through stage 4 chronic kidney disease, or unspecified chronic kidney disease: Secondary | ICD-10-CM | POA: Diagnosis not present

## 2019-03-27 DIAGNOSIS — E876 Hypokalemia: Secondary | ICD-10-CM | POA: Diagnosis not present

## 2019-03-27 DIAGNOSIS — I714 Abdominal aortic aneurysm, without rupture: Secondary | ICD-10-CM | POA: Diagnosis not present

## 2019-03-27 DIAGNOSIS — R0689 Other abnormalities of breathing: Secondary | ICD-10-CM | POA: Diagnosis not present

## 2019-03-27 DIAGNOSIS — R768 Other specified abnormal immunological findings in serum: Secondary | ICD-10-CM | POA: Diagnosis not present

## 2019-03-27 DIAGNOSIS — Z7982 Long term (current) use of aspirin: Secondary | ICD-10-CM

## 2019-03-27 DIAGNOSIS — Z8249 Family history of ischemic heart disease and other diseases of the circulatory system: Secondary | ICD-10-CM

## 2019-03-27 DIAGNOSIS — Z4682 Encounter for fitting and adjustment of non-vascular catheter: Secondary | ICD-10-CM | POA: Diagnosis not present

## 2019-03-27 DIAGNOSIS — Z66 Do not resuscitate: Secondary | ICD-10-CM | POA: Diagnosis not present

## 2019-03-27 DIAGNOSIS — R1031 Right lower quadrant pain: Secondary | ICD-10-CM

## 2019-03-27 DIAGNOSIS — Z7901 Long term (current) use of anticoagulants: Secondary | ICD-10-CM

## 2019-03-27 DIAGNOSIS — H409 Unspecified glaucoma: Secondary | ICD-10-CM | POA: Diagnosis present

## 2019-03-27 DIAGNOSIS — R0789 Other chest pain: Secondary | ICD-10-CM | POA: Diagnosis not present

## 2019-03-27 DIAGNOSIS — Z87891 Personal history of nicotine dependence: Secondary | ICD-10-CM | POA: Diagnosis not present

## 2019-03-27 DIAGNOSIS — Z833 Family history of diabetes mellitus: Secondary | ICD-10-CM

## 2019-03-27 DIAGNOSIS — I081 Rheumatic disorders of both mitral and tricuspid valves: Secondary | ICD-10-CM | POA: Diagnosis present

## 2019-03-27 DIAGNOSIS — I469 Cardiac arrest, cause unspecified: Secondary | ICD-10-CM

## 2019-03-27 DIAGNOSIS — Z95828 Presence of other vascular implants and grafts: Secondary | ICD-10-CM | POA: Diagnosis not present

## 2019-03-27 DIAGNOSIS — I340 Nonrheumatic mitral (valve) insufficiency: Secondary | ICD-10-CM | POA: Diagnosis not present

## 2019-03-27 DIAGNOSIS — Z95818 Presence of other cardiac implants and grafts: Secondary | ICD-10-CM

## 2019-03-27 DIAGNOSIS — Z8349 Family history of other endocrine, nutritional and metabolic diseases: Secondary | ICD-10-CM

## 2019-03-27 DIAGNOSIS — M1A9XX0 Chronic gout, unspecified, without tophus (tophi): Secondary | ICD-10-CM | POA: Diagnosis not present

## 2019-03-27 DIAGNOSIS — N210 Calculus in bladder: Secondary | ICD-10-CM | POA: Diagnosis present

## 2019-03-27 DIAGNOSIS — R001 Bradycardia, unspecified: Secondary | ICD-10-CM | POA: Diagnosis not present

## 2019-03-27 DIAGNOSIS — Q600 Renal agenesis, unilateral: Secondary | ICD-10-CM | POA: Diagnosis not present

## 2019-03-27 DIAGNOSIS — R791 Abnormal coagulation profile: Secondary | ICD-10-CM | POA: Diagnosis present

## 2019-03-27 DIAGNOSIS — I252 Old myocardial infarction: Secondary | ICD-10-CM

## 2019-03-27 DIAGNOSIS — N401 Enlarged prostate with lower urinary tract symptoms: Secondary | ICD-10-CM | POA: Diagnosis present

## 2019-03-27 DIAGNOSIS — J9600 Acute respiratory failure, unspecified whether with hypoxia or hypercapnia: Secondary | ICD-10-CM

## 2019-03-27 DIAGNOSIS — Z6831 Body mass index (BMI) 31.0-31.9, adult: Secondary | ICD-10-CM

## 2019-03-27 DIAGNOSIS — N186 End stage renal disease: Secondary | ICD-10-CM | POA: Diagnosis not present

## 2019-03-27 DIAGNOSIS — I959 Hypotension, unspecified: Secondary | ICD-10-CM | POA: Diagnosis not present

## 2019-03-27 DIAGNOSIS — Z9889 Other specified postprocedural states: Secondary | ICD-10-CM | POA: Diagnosis not present

## 2019-03-27 DIAGNOSIS — Z8261 Family history of arthritis: Secondary | ICD-10-CM

## 2019-03-27 DIAGNOSIS — Z95810 Presence of automatic (implantable) cardiac defibrillator: Secondary | ICD-10-CM | POA: Diagnosis not present

## 2019-03-27 DIAGNOSIS — D72829 Elevated white blood cell count, unspecified: Secondary | ICD-10-CM | POA: Diagnosis not present

## 2019-03-27 DIAGNOSIS — I440 Atrioventricular block, first degree: Secondary | ICD-10-CM | POA: Diagnosis present

## 2019-03-27 DIAGNOSIS — Z452 Encounter for adjustment and management of vascular access device: Secondary | ICD-10-CM | POA: Diagnosis not present

## 2019-03-27 DIAGNOSIS — Z881 Allergy status to other antibiotic agents status: Secondary | ICD-10-CM | POA: Diagnosis not present

## 2019-03-27 DIAGNOSIS — N179 Acute kidney failure, unspecified: Secondary | ICD-10-CM | POA: Diagnosis not present

## 2019-03-27 DIAGNOSIS — R5381 Other malaise: Secondary | ICD-10-CM | POA: Diagnosis not present

## 2019-03-27 DIAGNOSIS — Z4659 Encounter for fitting and adjustment of other gastrointestinal appliance and device: Secondary | ICD-10-CM

## 2019-03-27 DIAGNOSIS — R34 Anuria and oliguria: Secondary | ICD-10-CM | POA: Diagnosis present

## 2019-03-27 DIAGNOSIS — M1990 Unspecified osteoarthritis, unspecified site: Secondary | ICD-10-CM | POA: Diagnosis not present

## 2019-03-27 DIAGNOSIS — J90 Pleural effusion, not elsewhere classified: Secondary | ICD-10-CM | POA: Diagnosis not present

## 2019-03-27 DIAGNOSIS — R140 Abdominal distension (gaseous): Secondary | ICD-10-CM | POA: Diagnosis not present

## 2019-03-27 DIAGNOSIS — E875 Hyperkalemia: Secondary | ICD-10-CM | POA: Diagnosis not present

## 2019-03-27 DIAGNOSIS — I272 Pulmonary hypertension, unspecified: Secondary | ICD-10-CM | POA: Diagnosis present

## 2019-03-27 DIAGNOSIS — J449 Chronic obstructive pulmonary disease, unspecified: Secondary | ICD-10-CM | POA: Diagnosis not present

## 2019-03-27 DIAGNOSIS — I9589 Other hypotension: Secondary | ICD-10-CM | POA: Diagnosis not present

## 2019-03-27 DIAGNOSIS — R011 Cardiac murmur, unspecified: Secondary | ICD-10-CM | POA: Diagnosis not present

## 2019-03-27 DIAGNOSIS — E785 Hyperlipidemia, unspecified: Secondary | ICD-10-CM | POA: Diagnosis present

## 2019-03-27 LAB — COMPREHENSIVE METABOLIC PANEL
ALT: 19 U/L (ref 0–44)
AST: 23 U/L (ref 15–41)
Albumin: 4.3 g/dL (ref 3.5–5.0)
Alkaline Phosphatase: 85 U/L (ref 38–126)
Anion gap: 13 (ref 5–15)
BUN: 35 mg/dL — ABNORMAL HIGH (ref 8–23)
CO2: 22 mmol/L (ref 22–32)
Calcium: 9.1 mg/dL (ref 8.9–10.3)
Chloride: 99 mmol/L (ref 98–111)
Creatinine, Ser: 1.64 mg/dL — ABNORMAL HIGH (ref 0.61–1.24)
GFR calc Af Amer: 47 mL/min — ABNORMAL LOW (ref >60)
GFR calc non Af Amer: 40 mL/min — ABNORMAL LOW (ref >60)
Glucose, Bld: 136 mg/dL — ABNORMAL HIGH (ref 70–99)
Potassium: 4.1 mmol/L (ref 3.5–5.1)
Sodium: 134 mmol/L — ABNORMAL LOW (ref 135–145)
Total Bilirubin: 1.1 mg/dL (ref 0.3–1.2)
Total Protein: 7.1 g/dL (ref 6.5–8.1)

## 2019-03-27 LAB — CBC WITH DIFFERENTIAL/PLATELET
Abs Immature Granulocytes: 0.12 10*3/uL — ABNORMAL HIGH (ref 0.00–0.07)
Basophils Absolute: 0 10*3/uL (ref 0.0–0.1)
Basophils Relative: 0 %
Eosinophils Absolute: 0 10*3/uL (ref 0.0–0.5)
Eosinophils Relative: 0 %
HCT: 38.8 % — ABNORMAL LOW (ref 39.0–52.0)
Hemoglobin: 12.8 g/dL — ABNORMAL LOW (ref 13.0–17.0)
Immature Granulocytes: 1 %
Lymphocytes Relative: 1 %
Lymphs Abs: 0.3 10*3/uL — ABNORMAL LOW (ref 0.7–4.0)
MCH: 29 pg (ref 26.0–34.0)
MCHC: 33 g/dL (ref 30.0–36.0)
MCV: 87.8 fL (ref 80.0–100.0)
Monocytes Absolute: 0.9 10*3/uL (ref 0.1–1.0)
Monocytes Relative: 4 %
Neutro Abs: 20.1 10*3/uL — ABNORMAL HIGH (ref 1.7–7.7)
Neutrophils Relative %: 94 %
Platelets: 163 10*3/uL (ref 150–400)
RBC: 4.42 MIL/uL (ref 4.22–5.81)
RDW: 13.9 % (ref 11.5–15.5)
WBC: 21.4 10*3/uL — ABNORMAL HIGH (ref 4.0–10.5)
nRBC: 0 % (ref 0.0–0.2)

## 2019-03-27 LAB — URINALYSIS, ROUTINE W REFLEX MICROSCOPIC
Bilirubin Urine: NEGATIVE
Glucose, UA: NEGATIVE mg/dL
Hgb urine dipstick: NEGATIVE
Ketones, ur: 5 mg/dL — AB
Nitrite: NEGATIVE
Protein, ur: 100 mg/dL — AB
Specific Gravity, Urine: 1.031 — ABNORMAL HIGH (ref 1.005–1.030)
WBC, UA: >50 WBC/hpf — ABNORMAL HIGH (ref 0–5)
pH: 5 (ref 5.0–8.0)

## 2019-03-27 LAB — FIBRINOGEN: Fibrinogen: 568 mg/dL — ABNORMAL HIGH (ref 210–475)

## 2019-03-27 LAB — C-REACTIVE PROTEIN: CRP: 3.3 mg/dL — ABNORMAL HIGH (ref <1.0)

## 2019-03-27 LAB — LACTIC ACID, PLASMA: Lactic Acid, Venous: 0.9 mmol/L (ref 0.5–1.9)

## 2019-03-27 LAB — FERRITIN: Ferritin: 293 ng/mL (ref 24–336)

## 2019-03-27 LAB — SARS CORONAVIRUS 2 (TAT 6-24 HRS): SARS Coronavirus 2: NEGATIVE

## 2019-03-27 LAB — LACTATE DEHYDROGENASE: LDH: 278 U/L — ABNORMAL HIGH (ref 98–192)

## 2019-03-27 LAB — TRIGLYCERIDES: Triglycerides: 91 mg/dL (ref <150)

## 2019-03-27 LAB — D-DIMER, QUANTITATIVE: D-Dimer, Quant: 3.86 ug/mL-FEU — ABNORMAL HIGH (ref 0.00–0.50)

## 2019-03-27 LAB — PROTIME-INR
INR: 2.4 — ABNORMAL HIGH (ref 0.8–1.2)
Prothrombin Time: 25.7 seconds — ABNORMAL HIGH (ref 11.4–15.2)

## 2019-03-27 LAB — PROCALCITONIN: Procalcitonin: 0.18 ng/mL

## 2019-03-27 MED ORDER — IOHEXOL 300 MG/ML  SOLN
80.0000 mL | Freq: Once | INTRAMUSCULAR | Status: AC | PRN
  Administered 2019-03-27: 80 mL via INTRAVENOUS

## 2019-03-27 MED ORDER — VANCOMYCIN HCL IN DEXTROSE 1-5 GM/200ML-% IV SOLN: 1000.0000 mg | Freq: Once | INTRAVENOUS | Status: DC

## 2019-03-27 MED ORDER — SODIUM CHLORIDE 0.9 % IV SOLN
2.0000 g | Freq: Once | INTRAVENOUS | Status: AC
  Administered 2019-03-27: 2 g via INTRAVENOUS
  Filled 2019-03-27: qty 2

## 2019-03-27 MED ORDER — SODIUM CHLORIDE 0.9 % IV SOLN
2.0000 g | Freq: Two times a day (BID) | INTRAVENOUS | Status: DC
  Filled 2019-03-27 (×2): qty 2

## 2019-03-27 MED ORDER — VANCOMYCIN HCL 10 G IV SOLR
1250.0000 mg | INTRAVENOUS | Status: DC
  Filled 2019-03-27: qty 1250

## 2019-03-27 MED ORDER — ONDANSETRON HCL 4 MG/2ML IJ SOLN
4.0000 mg | Freq: Once | INTRAMUSCULAR | Status: AC
  Administered 2019-03-27: 4 mg via INTRAVENOUS
  Filled 2019-03-27: qty 2

## 2019-03-27 MED ORDER — VANCOMYCIN HCL 10 G IV SOLR
1750.0000 mg | Freq: Once | INTRAVENOUS | Status: AC
  Administered 2019-03-27: 1750 mg via INTRAVENOUS
  Filled 2019-03-27: qty 1750

## 2019-03-27 MED ORDER — METRONIDAZOLE IN NACL 5-0.79 MG/ML-% IV SOLN
500.0000 mg | Freq: Once | INTRAVENOUS | Status: AC
  Administered 2019-03-27: 500 mg via INTRAVENOUS
  Filled 2019-03-27: qty 100

## 2019-03-27 NOTE — ED Triage Notes (Signed)
Pt arrived via [facility].   Pt self reports of a temp of 102 at work earlier today around 1200 for which he took "some ibuprofen". Pt states he was also "shaking like a leaf on a tree". Pt endorses that he has not been around anyone that was sick that he knows of.

## 2019-03-27 NOTE — Progress Notes (Signed)
Pharmacy Antibiotic Note  John Barton is a 73 y.o. male admitted on 03/26/2019 with sepsis.  Pt's WBC is elevated at 21.4 and temperature was elevated at 99.5. Pt self reports a temperature of 102 earlier in the day around 1200, for which the patient took ibuprofen. Patient also experienced shaking but no SOB or cough. Pt does have a renal transplant and is on immunosuppression. Pharmacy has been consulted for Vancomycin and Cefepime dosing to empirically cover a bacterial infection.   Plan: Vancomycin IV 1750 mg X 1 as loading dose Vancomycin 1250 mg IV Q 24 hrs. Goal AUC 400-550. Expected AUC: 526 SCr used: 1.64 Cefepime 2g IV q8 hours Monitor WBC, temp, clinical status, and renal function  Height: 5' 10.5" (179.1 cm) Weight: 190 lb (86.2 kg) IBW/kg (Calculated) : 74.15  Temp (24hrs), Avg:99.5 F (37.5 C), Min:99.5 F (37.5 C), Max:99.5 F (37.5 C)  Recent Labs  Lab 03/31/2019 1822  WBC 21.4*  LATICACIDVEN 0.9    CrCl cannot be calculated (Patient's most recent lab result is older than the maximum 21 days allowed.).    No Known Allergies  Antimicrobials this admission: Vancomycin 10/09 >>  Cefepime 10/09 >>  Flagyl 10/09 X 1  Microbiology results: 10/09 BCx: Sent 10/09 COVID: Sent  Thank you for allowing pharmacy to be a part of this patient's care.  Sherren Kerns, PharmD PGY1 Acute Care Pharmacy Resident 04/13/2019 7:27 PM

## 2019-03-27 NOTE — ED Provider Notes (Addendum)
[facility] EMERGENCY DEPARTMENT Provider Note   CSN: [id number] Arrival date & time: 04/08/2019  1800     History   Chief Complaint Chief Complaint  Patient presents with  . Tremors  . Fever    HPI John Barton is a 73 y.o. male.     HPI Patient presents with fever of 102 at work today.  Began around 11 AM or noon.  States he was shaking.  No shortness of breath or cough.  No nausea vomiting diarrhea.  No abdominal pain.  No dysuria.  Is a history of a renal transplant and appears to be on immunosuppression.  He works as a Education officer, community and reportedly wears a mask but has contacts with many people. Past Medical History:  Diagnosis Date  . Arthritis   . Bifascicular block    First degree AV block, right bundle branch block left anterior fascicular block.  Re-eval with event monitor 07/2017--cardiology  . Blood transfusion without reported diagnosis   . BPH with obstruction/lower urinary tract symptoms   . Carotid stenosis   . Chronic gout    due to renal impairment  . 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 Jimmy Footman.  GFR stable at 55 ml/min (Cr 1.28) 07/16/17.  Neph f/u 08/08/17, Cr up to 1.71, GFR 39 (in context of vol ovld & imp bladder emptying b/c out of flomax x 1 wk).  Cr  1.38 and GFR 50 ml/min as of 08/21/17 renal f/u.  Cr 1.27, GFR 56 on renal f/u 10/01/17. Cr 1.24, GFR 57 on 01/06/18. Cr 1.41, GFR 49 Jan 2020. Stab GFR 09/2018   . COPD (chronic obstructive pulmonary disease) ([place])   . Coronary artery disease    CABG 1995.  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 Diabetes mellitus without complication ([place]) 66/0600   New dx 10/15/16 by A1c criteria (6.6%)--recommended nutritionist referral/no meds at initial dx.  . Glaucoma   . H/O paroxysmal supraventricular tachycardia    with wide complex. + Hx of a-fib as well.  Coumadin mgmt --nephrology  . History of renal  transplant 1988  . Hyperlipidemia   . Hypertension   . ICD (implantable cardioverter-defibrillator) in place    Normal device check 01/28/16.  . Ischemic cardiomyopathy    Ischemic heart disease and mild depressed LV and RV function + DD and mild pulm HTN  . Post-transplant erythrocytosis   . 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  //  Echo 2/18: mild LVH, EF 45-50, diff HK, Gr 1 DD, severe MAC, mild Mitral Stenosis, normally functioning mechanical AVR (mean 12, peak 25).  Per cards, repeat echo planned for 2020.  Marland Kitchen Secondary hyperparathyroidism ([place])    parathyroidectomy done; subsequently has hypoparathyroidism, takes vit D  . Sinus bradycardia   . Syncope    He is s/p loop recorder insertion; as of Dr. Olin Pia f/u 01/16/16 he has had no further syncope and no significant arrhythmias.  He elected to keep the loop recorder in and was told to f/u 1 yr with Dr. Caryl Comes.   NORMAL DEVICE REMOTE TRANSMISSION--NO ATRIAL FIBRILLATION--as of 09/02/15.    Patient Active Problem List   Diagnosis Date Noted  . Colon cancer screening 08/27/2015  . Prostate cancer screening 08/27/2015  . Upper airway cough syndrome 08/11/2014  . Preventative health care 08/11/2014  . Trigger finger, acquired 01/11/2014  .  Yellow jacket sting 12/21/2013  . History of parathyroidectomy 11/20/2013  . Chronic anticoagulation 11/20/2013  . Left rotator cuff tear arthropathy 10/12/2013  . Pectoralis major tendonitis 10/12/2013  . At risk for bleeding 10/11/2013  . Tear of deltoid muscle 10/11/2013  . Ceruminosis 10/04/2013  . Syncope 08/29/2012  . Coronary artery disease-prior inferior wall MI 08/29/2012  . Gout   . COPD (chronic obstructive pulmonary disease) ([place])   . Arthritis   . S/P aortic valve replacement 04/20/2011  . Hx of CABG 04/20/2011  . SVT (supraventricular tachycardia) ([place]) 04/20/2011  . Kidney replaced by transplant 04/20/2011  .  Bifascicular block 04/20/2011  . Dyslipidemia 04/20/2011  . Chronic kidney disease 09/10/1996    Past Surgical History:  Procedure Laterality Date  . AORTIC VALVE REPLACEMENT  1995   [facility] mechanical valve  . CARDIAC CATHETERIZATION  1995  . CARDIAC EVENT MONITOR  07/2017   PVCs, sinus brady---no significant abnormal findings.  . CORONARY ARTERY BYPASS GRAFT  1995   RCA andLAD  . ELECTROPHYSIOLOGY STUDY N/A 09/11/2012   Procedure: ELECTROPHYSIOLOGY STUDY;  Surgeon: Deboraha Sprang, MD;  Location: [facility] CATH LAB;  Service: Cardiovascular;  Laterality: N/A;  . 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 parathyroidectomy    . TRANSTHORACIC ECHOCARDIOGRAM  08/15/12; 08/03/16; 11/04/17   2014: Mild LVH and LV dilation, EF 50-55%, normal aortic valve gradients.  2018: EF 45-50%, normal functioning prosthetic AV, diffuse hypokinesis, grd I DD, mild mitral stenosis.  10/2017: EF 40-45%, global hypokinesis, grd II DD, mild MR, severe LAE, mild RV dysfxn, mild inc pulm press.        Home Medications    Prior to Admission medications   Medication Sig Start Date End Date Taking? Authorizing Provider  allopurinol (ZYLOPRIM) 100 MG tablet 1 tablet by mouth in the morning 2 tablets by mouth at night    [provider]  aspirin EC 81 MG tablet Take 1 tablet (81 mg total) by mouth daily. 11/03/18   Deboraha Sprang, MD  atorvastatin (LIPITOR) 80 MG tablet Take 1 tablet (80 mg total) by mouth daily. 04/17/16   McGowen, [name], MD  calcitRIOL (ROCALTROL) 0.5 MCG capsule Take 0.5 mcg by mouth daily. Take two tablets by mouth on Monday,wed,fri, sun. Take one tablet by mouth tues, thurs and sat.    [provider]  furosemide (LASIX) 40 MG tablet Take 40 mg by mouth daily.    [provider]  lisinopril (PRINIVIL,ZESTRIL) 5 MG tablet Take 5 mg by mouth daily.    [provider]   Multiple Vitamins-Minerals (OCUVITE PO) Take 1 tablet by mouth 2 (two) times a day.     [provider]  mycophenolate (CELLCEPT) 500 MG tablet Take 1,000 mg by mouth 2 (two) times daily.     [provider]  Naproxen Sodium (ALEVE PO) Take 1 tablet by mouth daily as needed (pain).    [provider]  tamsulosin (FLOMAX) 0.4 MG CAPS capsule Take 0.4 mg by mouth daily.  03/21/16   [provider]  warfarin (COUMADIN) 2 MG tablet Take 2 mg by mouth. 7.5 mg M,W, F. All other days 33m    [provider]  warfarin (COUMADIN) 5 MG tablet TAKE 1 (ONE) TABLET AS DIRECTED 11/02/17   [provider]    Family History Family History  Problem Relation Age of Onset  . Heart failure Father   .  Heart disease Father   . Hyperlipidemia Father   . Hypertension Father   . Diabetes Father   . Arthritis Mother   . Heart disease Mother   . Hyperlipidemia Mother   . Hypertension Mother   . Heart failure Mother   . Heart disease Brother   . Diabetes Daughter   . Alcohol abuse Brother   . Liver disease Brother        Liver transplant, ETOH    Social History Social History   Tobacco Use  . Smoking status: Former Smoker    Types: Cigars    Quit date: 05/29/2014    Years since quitting: 4.8  . Smokeless tobacco: Never Used  . Tobacco comment: quit cigarettes '06  Substance Use Topics  . Alcohol use: Yes    Alcohol/week: 14.0 standard drinks    Types: 14 Cans of beer per week    Comment: 2 beers/daily  . Drug use: No     Allergies   Patient has no known allergies.   Review of Systems Review of Systems  Constitutional: Positive for fever. Negative for appetite change.  HENT: Negative for congestion.   Respiratory: Positive for cough. Negative for shortness of breath.        Chronic cough.  Unchanged from the last years.  Cardiovascular: Negative for chest pain.  Gastrointestinal: Negative for abdominal pain, diarrhea, nausea and  vomiting.  Genitourinary: Negative for flank pain.  Musculoskeletal: Negative for back pain.  Neurological: Negative for weakness.  Hematological: Negative for adenopathy.  Psychiatric/Behavioral: Negative for confusion.     Physical Exam Updated Vital Signs BP (!) 103/47   Pulse 97   Temp 99.5 F (37.5 C) (Oral)   Resp (!) 23   Ht 5' 10.5" (1.791 m)   Wt 86.2 kg   SpO2 91%   BMI 26.88 kg/m   Physical Exam Vitals signs and nursing note reviewed.  HENT:     Head: Normocephalic.  Eyes:     Pupils: Pupils are equal, round, and reactive to light.  Neck:     Musculoskeletal: Neck supple.  Cardiovascular:     Rate and Rhythm: Regular rhythm.  Pulmonary:     Comments: Some rhonchi on lung field on right side.  No respiratory distress. Abdominal:     Tenderness: There is abdominal tenderness.     Comments: Right lower quadrant tenderness without rebound or guarding.  Reducible umbilical hernia.  Musculoskeletal:        General: No tenderness.  Skin:    General: Skin is warm.     Capillary Refill: Capillary refill takes less than 2 seconds.  Neurological:     General: No focal deficit present.     Mental Status: He is alert.  Psychiatric:        Mood and Affect: Mood normal.      ED Treatments / Results  Labs (all labs ordered are listed, but only abnormal results are displayed) Labs Reviewed  CBC WITH DIFFERENTIAL/PLATELET - Abnormal; Notable for the following components:      Result Value   WBC 21.4 (*)    Hemoglobin 12.8 (*)    HCT 38.8 (*)    Neutro Abs 20.1 (*)    Lymphs Abs 0.3 (*)    Abs Immature Granulocytes 0.12 (*)    All other components within normal limits  COMPREHENSIVE METABOLIC PANEL - Abnormal; Notable for the following components:   Sodium 134 (*)    Glucose, Bld 136 (*)  BUN 35 (*)    Creatinine, Ser 1.64 (*)    GFR calc non Af Amer 40 (*)    GFR calc Af Amer 47 (*)    All other components within normal limits  D-DIMER, QUANTITATIVE  (NOT AT [facility]) - Abnormal; Notable for the following components:   D-Dimer, Quant 3.86 (*)    All other components within normal limits  FIBRINOGEN - Abnormal; Notable for the following components:   Fibrinogen 568 (*)    All other components within normal limits  C-REACTIVE PROTEIN - Abnormal; Notable for the following components:   CRP 3.3 (*)    All other components within normal limits  LACTATE DEHYDROGENASE - Abnormal; Notable for the following components:   LDH 278 (*)    All other components within normal limits  PROTIME-INR - Abnormal; Notable for the following components:   Prothrombin Time 25.7 (*)    INR 2.4 (*)    All other components within normal limits  CULTURE, BLOOD (ROUTINE X 2)  CULTURE, BLOOD (ROUTINE X 2)  SARS CORONAVIRUS 2 (TAT 6-24 HRS)  LACTIC ACID, PLASMA  FERRITIN  PROCALCITONIN  TRIGLYCERIDES  LACTIC ACID, PLASMA  URINALYSIS, ROUTINE W REFLEX MICROSCOPIC    EKG EKG Interpretation  Date/Time:  Friday March 27 2019 18:08:13 EDT Ventricular Rate:  99 PR Interval:    QRS Duration: 146 QT Interval:  410 QTC Calculation: 527 R Axis:   -89 Text Interpretation:  Atrial fibrillation RBBB and LAFB Confirmed by Davonna Belling [id number]) on 03/22/2019 6:59:22 PM   Radiology Ct Abdomen Pelvis W Contrast  Result Date: 03/24/2019 CLINICAL DATA:  Fever.  No abdominal complaints. EXAM: CT ABDOMEN AND PELVIS WITH CONTRAST TECHNIQUE: Multidetector CT imaging of the abdomen and pelvis was performed using the standard protocol following bolus administration of intravenous contrast. CONTRAST:  78m OMNIPAQUE IOHEXOL 300 MG/ML  SOLN COMPARISON:  None. FINDINGS: Lower chest: No acute abnormality. Bibasilar atelectasis/scarring. Dense coronary, aortic valve, and mitral valve calcifications. Hepatobiliary: No focal liver abnormality. Tiny gallstones. No gallbladder wall thickening or biliary dilatation. Pancreas: Unremarkable. No pancreatic ductal dilatation or  surrounding inflammatory changes. Spleen: Normal in size without focal abnormality. Adrenals/Urinary Tract: The adrenal glands are unremarkable. Absent left kidney. Severely atrophic right kidney. Left lower quadrant transplant kidney demonstrates mild perinephric stranding but is otherwise unremarkable. No hydronephrosis. 1.8 cm bladder calculus. Small left lateral bladder diverticulum. No bladder wall thickening. Stomach/Bowel: Stomach is within normal limits. Appendix appears normal. No evidence of bowel wall thickening, distention, or inflammatory changes. Vascular/Lymphatic: 5.1 cm infrarenal abdominal aortic aneurysm. Extensive aortoiliac and branch vessel atherosclerotic calcification. No enlarged abdominal or pelvic lymph nodes. Reproductive: Borderline prostatomegaly with coarse central calcifications. Other: Small fat containing umbilical hernia. No free fluid or pneumoperitoneum. Musculoskeletal: No acute or significant osseous findings. Chronic distal sacral fracture centrally at S4-L5. IMPRESSION: 1.  No acute intra-abdominal process. 2. 5.1 cm infrarenal abdominal aortic aneurysm. Recommend followup by abdomen and pelvis CTA in 3-6 months, and vascular surgery referral/consultation if not already obtained. This recommendation follows ACR consensus guidelines: White Paper of the ACR Incidental Findings Committee II on Vascular Findings. J Am Coll Radiol 2013; 10:789-794. Aortic aneurysm NOS (ICD10-I71.9) 3. Left lower quadrant renal transplant with mild perinephric stranding, nonspecific. No hydronephrosis. 4. 1.8 cm bladder calculus. 5. Cholelithiasis. 6.  Aortic atherosclerosis (ICD10-I70.0). Electronically Signed   By: WTitus DubinM.D.   On: 03/23/2019 22:28   Dg Chest Portable 1 View  Result Date: 03/31/2019 CLINICAL DATA:  Fever. EXAM:  PORTABLE CHEST 1 VIEW COMPARISON:  May 06, 2018 FINDINGS: Cardiomediastinal silhouette is normal. Mediastinal contours appear intact. Calcific  atherosclerotic disease of the aorta. Post CABG postsurgical changes. Streaky airspace opacities in the left lung base. Osseous structures are without acute abnormality. Soft tissues are grossly normal. IMPRESSION: 1. Streaky airspace opacities in the left lung base may represent atelectasis or infectious airspace consolidation. 2. Calcific atherosclerotic disease of the aorta. Electronically Signed   By: Fidela Salisbury M.D.   On: 03/20/2019 18:49    Procedures Procedures (including critical care time)  Medications Ordered in ED Medications  ceFEPIme (MAXIPIME) 2 g in sodium chloride 0.9 % 100 mL IVPB (has no administration in time range)  vancomycin (VANCOCIN) 1,250 mg in sodium chloride 0.9 % 250 mL IVPB (has no administration in time range)  ceFEPIme (MAXIPIME) 2 g in sodium chloride 0.9 % 100 mL IVPB (0 g Intravenous Stopped 03/26/2019 2055)  metroNIDAZOLE (FLAGYL) IVPB 500 mg (0 mg Intravenous Stopped 03/29/2019 2159)  vancomycin (VANCOCIN) 1,750 mg in sodium chloride 0.9 % 500 mL IVPB (0 mg Intravenous Stopped 04/15/2019 2303)  iohexol (OMNIPAQUE) 300 MG/ML solution 80 mL (80 mLs Intravenous Contrast Given 03/29/2019 2157)  ondansetron (ZOFRAN) injection 4 mg (4 mg Intravenous Given 04/17/2019 2244)     Initial Impression / Assessment and Plan / ED Course  I have reviewed the triage vital signs and the nursing notes.  Pertinent labs & imaging results that were available during my care of the patient were reviewed by me and considered in my medical decision making (see chart for details).        Patient with fever at home.  Mild decrease in oxygen saturation.  Has localizing lung findings with rhonchi on the right.  X-ray showed potential pneumonia, however on left.  White count is elevated at 21.  Worsening renal function creatinine mildly elevated above his baseline.  He is on immunosuppression for kidney transplant.  I think covert infection is likely but also potentially could be urinary  tract infection with a kidney transplant and nonspecific stranding over the kidney and the CT scan.  Urinalysis pending as is the COVID test.  Care turned over to Dr. Leonette Monarch..  Urinalysis shows white cells only rare bacteria.  Has leukocytes but no nitrite.  COVID test is negative.  Will admit and be treated like a urinary tract infection patient had normal lactic acid and has not been hypotensive.  Final Clinical Impressions(s) / ED Diagnoses   Final diagnoses:  Fever, unspecified fever cause  AKI (acute kidney injury) [facility])  Renal transplant recipient    ED Discharge Orders    None       Davonna Belling, MD 04/10/2019 2306    Davonna Belling, MD 04/10/2019 2316    Davonna Belling, MD 04/04/2019 [phone]

## 2019-03-28 ENCOUNTER — Inpatient Hospital Stay (HOSPITAL_COMMUNITY): Payer: Medicare Other

## 2019-03-28 DIAGNOSIS — I38 Endocarditis, valve unspecified: Secondary | ICD-10-CM

## 2019-03-28 DIAGNOSIS — J189 Pneumonia, unspecified organism: Secondary | ICD-10-CM | POA: Diagnosis present

## 2019-03-28 LAB — CBC
HCT: 36.9 % — ABNORMAL LOW (ref 39.0–52.0)
Hemoglobin: 12.2 g/dL — ABNORMAL LOW (ref 13.0–17.0)
MCH: 29 pg (ref 26.0–34.0)
MCHC: 33.1 g/dL (ref 30.0–36.0)
MCV: 87.9 fL (ref 80.0–100.0)
Platelets: 170 10*3/uL (ref 150–400)
RBC: 4.2 MIL/uL — ABNORMAL LOW (ref 4.22–5.81)
RDW: 13.9 % (ref 11.5–15.5)
WBC: 33.7 10*3/uL — ABNORMAL HIGH (ref 4.0–10.5)
nRBC: 0 % (ref 0.0–0.2)

## 2019-03-28 LAB — COMPREHENSIVE METABOLIC PANEL
ALT: 19 U/L (ref 0–44)
AST: 28 U/L (ref 15–41)
Albumin: 3.9 g/dL (ref 3.5–5.0)
Alkaline Phosphatase: 78 U/L (ref 38–126)
Anion gap: 14 (ref 5–15)
BUN: 41 mg/dL — ABNORMAL HIGH (ref 8–23)
CO2: 22 mmol/L (ref 22–32)
Calcium: 8.8 mg/dL — ABNORMAL LOW (ref 8.9–10.3)
Chloride: 100 mmol/L (ref 98–111)
Creatinine, Ser: 2.32 mg/dL — ABNORMAL HIGH (ref 0.61–1.24)
GFR calc Af Amer: 31 mL/min — ABNORMAL LOW (ref >60)
GFR calc non Af Amer: 26 mL/min — ABNORMAL LOW (ref >60)
Glucose, Bld: 157 mg/dL — ABNORMAL HIGH (ref 70–99)
Potassium: 4.3 mmol/L (ref 3.5–5.1)
Sodium: 136 mmol/L (ref 135–145)
Total Bilirubin: 1.3 mg/dL — ABNORMAL HIGH (ref 0.3–1.2)
Total Protein: 6.5 g/dL (ref 6.5–8.1)

## 2019-03-28 LAB — GLUCOSE, CAPILLARY
Glucose-Capillary: 150 mg/dL — ABNORMAL HIGH (ref 70–99)
Glucose-Capillary: 125 mg/dL — ABNORMAL HIGH (ref 70–99)

## 2019-03-28 LAB — MRSA PCR SCREENING: MRSA by PCR: NEGATIVE

## 2019-03-28 LAB — PROTIME-INR
INR: 2.1 — ABNORMAL HIGH (ref 0.8–1.2)
Prothrombin Time: 23.2 seconds — ABNORMAL HIGH (ref 11.4–15.2)

## 2019-03-28 LAB — LACTIC ACID, PLASMA: Lactic Acid, Venous: 1.6 mmol/L (ref 0.5–1.9)

## 2019-03-28 LAB — ECHOCARDIOGRAM COMPLETE
Height: 70.5 in
Weight: 3040 oz

## 2019-03-28 MED ORDER — LISINOPRIL 5 MG PO TABS
5.0000 mg | ORAL_TABLET | Freq: Every day | ORAL | Status: DC
  Administered 2019-03-28: 5 mg via ORAL
  Filled 2019-03-28: qty 1

## 2019-03-28 MED ORDER — CALCITRIOL 0.5 MCG PO CAPS: 0.5000 ug | ORAL_CAPSULE | Freq: Every day | ORAL | Status: DC

## 2019-03-28 MED ORDER — SODIUM CHLORIDE 0.9 % IV SOLN
2.0000 g | INTRAVENOUS | Status: DC
  Administered 2019-03-28 – 2019-03-30 (×3): 2 g via INTRAVENOUS
  Filled 2019-03-28 (×7): qty 2

## 2019-03-28 MED ORDER — ACETAMINOPHEN 650 MG RE SUPP: 650.0000 mg | Freq: Four times a day (QID) | RECTAL | Status: DC | PRN

## 2019-03-28 MED ORDER — WARFARIN SODIUM 5 MG PO TABS
7.0000 mg | ORAL_TABLET | Freq: Once | ORAL | Status: AC
  Administered 2019-03-28: 7 mg via ORAL
  Filled 2019-03-28 (×2): qty 1

## 2019-03-28 MED ORDER — ONDANSETRON HCL 4 MG/2ML IJ SOLN: 4.0000 mg | Freq: Four times a day (QID) | INTRAMUSCULAR | Status: DC | PRN

## 2019-03-28 MED ORDER — FUROSEMIDE 40 MG PO TABS
40.0000 mg | ORAL_TABLET | Freq: Every day | ORAL | Status: DC
  Administered 2019-03-28: 40 mg via ORAL
  Filled 2019-03-28: qty 1

## 2019-03-28 MED ORDER — SODIUM CHLORIDE 0.9 % IV BOLUS
500.0000 mL | Freq: Once | INTRAVENOUS | Status: AC
  Administered 2019-03-28: 500 mL via INTRAVENOUS

## 2019-03-28 MED ORDER — CALCITRIOL 0.25 MCG PO CAPS
1.0000 ug | ORAL_CAPSULE | ORAL | Status: DC
  Administered 2019-03-29 – 2019-04-10 (×7): 1 ug via ORAL
  Filled 2019-03-28 (×5): qty 2

## 2019-03-28 MED ORDER — TAMSULOSIN HCL 0.4 MG PO CAPS
0.4000 mg | ORAL_CAPSULE | Freq: Every day | ORAL | Status: DC
  Administered 2019-03-28 – 2019-04-11 (×15): 0.4 mg via ORAL
  Filled 2019-03-28 (×16): qty 1

## 2019-03-28 MED ORDER — ALLOPURINOL 300 MG PO TABS
300.0000 mg | ORAL_TABLET | Freq: Every day | ORAL | Status: DC
  Administered 2019-03-28: 300 mg via ORAL
  Filled 2019-03-28: qty 1

## 2019-03-28 MED ORDER — CALCITRIOL 0.25 MCG PO CAPS
0.5000 ug | ORAL_CAPSULE | ORAL | Status: DC
  Administered 2019-03-28 – 2019-04-11 (×7): 0.5 ug via ORAL
  Filled 2019-03-28 (×6): qty 1
  Filled 2019-03-28 (×2): qty 2
  Filled 2019-03-28 (×2): qty 1

## 2019-03-28 MED ORDER — WARFARIN - PHARMACIST DOSING INPATIENT
Freq: Every day | Status: DC
  Administered 2019-03-28 – 2019-04-04 (×3)

## 2019-03-28 MED ORDER — PNEUMOCOCCAL VAC POLYVALENT 25 MCG/0.5ML IJ INJ: 0.5000 mL | INJECTION | INTRAMUSCULAR | Status: DC

## 2019-03-28 MED ORDER — SODIUM CHLORIDE 0.9 % IV SOLN
INTRAVENOUS | Status: DC
  Administered 2019-03-28 – 2019-03-30 (×7): via INTRAVENOUS

## 2019-03-28 MED ORDER — MYCOPHENOLATE MOFETIL 500 MG PO TABS
1000.0000 mg | ORAL_TABLET | Freq: Two times a day (BID) | ORAL | Status: DC
  Administered 2019-03-29 (×3): 1000 mg via ORAL
  Filled 2019-03-28 (×4): qty 2

## 2019-03-28 MED ORDER — INSULIN ASPART 100 UNIT/ML ~~LOC~~ SOLN: 0.0000 [IU] | Freq: Every day | SUBCUTANEOUS | Status: DC

## 2019-03-28 MED ORDER — MYCOPHENOLATE MOFETIL 250 MG PO CAPS
1000.0000 mg | ORAL_CAPSULE | Freq: Two times a day (BID) | ORAL | Status: DC
  Administered 2019-03-28: 1000 mg via ORAL
  Filled 2019-03-28 (×2): qty 4

## 2019-03-28 MED ORDER — ATORVASTATIN CALCIUM 80 MG PO TABS
80.0000 mg | ORAL_TABLET | Freq: Every day | ORAL | Status: DC
  Administered 2019-03-28 – 2019-04-11 (×15): 80 mg via ORAL
  Filled 2019-03-28 (×16): qty 1

## 2019-03-28 MED ORDER — SODIUM CHLORIDE 0.45 % IV SOLN
INTRAVENOUS | Status: DC
  Administered 2019-03-28: 02:00:00 via INTRAVENOUS

## 2019-03-28 MED ORDER — INSULIN ASPART 100 UNIT/ML ~~LOC~~ SOLN
0.0000 [IU] | Freq: Three times a day (TID) | SUBCUTANEOUS | Status: DC
  Administered 2019-03-28 – 2019-03-29 (×2): 1 [IU] via SUBCUTANEOUS
  Administered 2019-03-29: 2 [IU] via SUBCUTANEOUS

## 2019-03-28 MED ORDER — SODIUM CHLORIDE 0.9 % IV BOLUS
1000.0000 mL | Freq: Once | INTRAVENOUS | Status: AC
  Administered 2019-03-28: 1000 mL via INTRAVENOUS

## 2019-03-28 MED ORDER — ASPIRIN EC 81 MG PO TBEC
81.0000 mg | DELAYED_RELEASE_TABLET | Freq: Every day | ORAL | Status: DC
  Administered 2019-03-28 – 2019-04-11 (×15): 81 mg via ORAL
  Filled 2019-03-28 (×15): qty 1

## 2019-03-28 MED ORDER — ACETAMINOPHEN 325 MG PO TABS
650.0000 mg | ORAL_TABLET | Freq: Four times a day (QID) | ORAL | Status: DC | PRN
  Administered 2019-03-28: 650 mg via ORAL
  Filled 2019-03-28: qty 2

## 2019-03-28 MED ORDER — INFLUENZA VAC A&B SA ADJ QUAD 0.5 ML IM PRSY
0.5000 mL | PREFILLED_SYRINGE | INTRAMUSCULAR | Status: DC
  Filled 2019-03-28: qty 0.5

## 2019-03-28 MED ORDER — ONDANSETRON HCL 4 MG PO TABS: 4.0000 mg | ORAL_TABLET | Freq: Four times a day (QID) | ORAL | Status: DC | PRN

## 2019-03-28 MED ORDER — VANCOMYCIN VARIABLE DOSE PER UNSTABLE RENAL FUNCTION (PHARMACIST DOSING): Status: DC

## 2019-03-28 NOTE — Progress Notes (Signed)
ANTICOAGULATION CONSULT NOTE - Initial Consult  Pharmacy Consult for Coumadin Indication: atrial fibrillation/AVR  No Known Allergies  Patient Measurements: Height: 5' 10.5" (179.1 cm) Weight: 190 lb (86.2 kg) IBW/kg (Calculated) : 74.15  Vital Signs: Temp: 99.5 F (37.5 C) (10/10 0819) Temp Source: Oral (10/10 0819) BP: 126/101 (10/10 0819) Pulse Rate: 71 (10/10 0819)  Labs: Recent Labs    03/26/2019 1822 03/28/2019 1912 03/28/19 0220 03/28/19 0708  HGB 12.8*  --  12.2*  --   HCT 38.8*  --  36.9*  --   PLT 163  --  170  --   LABPROT  --  25.7*  --  23.2*  INR  --  2.4*  --  2.1*  CREATININE 1.64*  --  2.32*  --     Estimated Creatinine Clearance: 28.9 mL/min (A) (by C-G formula based on SCr of 2.32 mg/dL (H)).   Medical History: Past Medical History:  Diagnosis Date  . Arthritis   . Bifascicular block    First degree AV block, right bundle branch block left anterior fascicular block.  Re-eval with event monitor 07/2017--cardiology  . Blood transfusion without reported diagnosis   . BPH with obstruction/lower urinary tract symptoms   . Carotid stenosis   . Chronic gout    due to renal impairment  . 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 Jimmy Footman.  GFR stable at 55 ml/min (Cr 1.28) 07/16/17.  Neph f/u 08/08/17, Cr up to 1.71, GFR 39 (in context of vol ovld & imp bladder emptying b/c out of flomax x 1 wk).  Cr  1.38 and GFR 50 ml/min as of 08/21/17 renal f/u.  Cr 1.27, GFR 56 on renal f/u 10/01/17. Cr 1.24, GFR 57 on 01/06/18. Cr 1.41, GFR 49 Jan 2020. Stab GFR 09/2018   . COPD (chronic obstructive pulmonary disease) ([place])   . Coronary artery disease    CABG 1995.  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 Diabetes mellitus without complication ([place]) 88/4166   New dx 10/15/16 by A1c criteria (6.6%)--recommended nutritionist referral/no meds at initial dx.  . Glaucoma   . H/O paroxysmal  supraventricular tachycardia    with wide complex. + Hx of a-fib as well.  Coumadin mgmt --nephrology  . History of renal transplant 1988  . Hyperlipidemia   . Hypertension   . ICD (implantable cardioverter-defibrillator) in place    Normal device check 01/28/16.  . Ischemic cardiomyopathy    Ischemic heart disease and mild depressed LV and RV function + DD and mild pulm HTN  . Post-transplant erythrocytosis   . 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  //  Echo 2/18: mild LVH, EF 45-50, diff HK, Gr 1 DD, severe MAC, mild Mitral Stenosis, normally functioning mechanical AVR (mean 12, peak 25).  Per cards, repeat echo planned for 2020.  Marland Kitchen Secondary hyperparathyroidism ([place])    parathyroidectomy done; subsequently has hypoparathyroidism, takes vit D  . Sinus bradycardia   . Syncope    He is s/p loop recorder insertion; as of Dr. Olin Pia f/u 01/16/16 he has had no further syncope and no significant arrhythmias.  He elected to keep the loop recorder in and was told to f/u 1 yr with Dr. Caryl Comes.   NORMAL DEVICE REMOTE TRANSMISSION--NO ATRIAL FIBRILLATION--as of 09/02/15.    Medications:  No current facility-administered medications on file prior to encounter.  Current Outpatient Medications on File Prior to Encounter  Medication Sig Dispense Refill  . allopurinol (ZYLOPRIM) 100 MG tablet 1 tablet by mouth in the morning 2 tablets by mouth at night    . aspirin EC 81 MG tablet Take 1 tablet (81 mg total) by mouth daily. 90 tablet 3  . atorvastatin (LIPITOR) 80 MG tablet Take 1 tablet (80 mg total) by mouth daily. 90 tablet 3  . calcitRIOL (ROCALTROL) 0.5 MCG capsule Take 0.5 mcg by mouth daily. Take two tablets by mouth on Monday,wed,fri, sun. Take one tablet by mouth tues, thurs and sat.    . furosemide (LASIX) 40 MG tablet Take 40 mg by mouth daily.    Marland Kitchen lisinopril (PRINIVIL,ZESTRIL) 5 MG tablet Take 5 mg by mouth daily.    .  Multiple Vitamins-Minerals (OCUVITE PO) Take 1 tablet by mouth 2 (two) times a day.     . mycophenolate (CELLCEPT) 500 MG tablet Take 1,000 mg by mouth 2 (two) times daily.     . Naproxen Sodium (ALEVE PO) Take 1 tablet by mouth daily as needed (pain).    . tamsulosin (FLOMAX) 0.4 MG CAPS capsule Take 0.4 mg by mouth daily.     Marland Kitchen warfarin (COUMADIN) 2 MG tablet Take 2 mg by mouth. 7.5 mg M,W, F. All other days 33m    . warfarin (COUMADIN) 5 MG tablet TAKE 1 (ONE) TABLET AS DIRECTED  10     Assessment: 73y.o. male admitted 04/12/2019 for sepsis. Pharmacy has been consulted for warfarin for Afib and mechanical AVR. Patient is on warfarin PTA. INR on admission was 2.4 and is 2.1 today is therapeutic. Patient was not administered warfarin on 10/9 (admission day) and reports he did not take it on 10/9 prior to coming. Will give slightly higher dose of home regimen given patient is subtherapeutic and missed dose. CBC stable. No reported bleeding.   Warfarin PTA regimen: 730mon Tuesday & Thursday; 26m10mll other days  - PTA regimen confirmed via patient with pharmacist and pharmacy technician separately. Patient's daughter was unsure of patient's regimen just that patient took a different dose twice weekly. No notes available via CareEverywhere to confirm.     Goal of Therapy:  INR 2.5-3.5 Monitor platelets by anticoagulation protocol: Yes   Plan:  Warfarin 7mg3m  Monitor INR, CBC, and S/S of bleeding daily   GracCristela FeltarmD PGY1 Pharmacy Resident [employer]: [phone]/03/2019,8:38 AM

## 2019-03-28 NOTE — Progress Notes (Signed)
Report given to nurse Zeeinab on 3E. All belongings sent with patient. Pt's daughter called and updated.  Ave Filter, RN

## 2019-03-28 NOTE — Progress Notes (Signed)
Patient blood pressure was low, patient asymptomatic in bed. MD notified and he gave a verbal order for a 500 ml bolus. Bolus is being administered. Will continue to monitor patient.

## 2019-03-28 NOTE — Progress Notes (Signed)
On call MD notified of pt's bp being 82/50 manually. MD also notified of pt having 5 type 7 stools since admission. Will continue to monitor pt & await any new orders. Hoover Brunette, RN

## 2019-03-28 NOTE — Progress Notes (Signed)
*  PRELIMINARY RESULTS* Echocardiogram 2D Echocardiogram has been performed.  John Barton 03/28/2019, 3:28 PM

## 2019-03-28 NOTE — Progress Notes (Signed)
ANTICOAGULATION CONSULT NOTE - Initial Consult  Pharmacy Consult for Coumadin Indication: atrial fibrillation/AVR  No Known Allergies  Patient Measurements: Height: 5' 10.5" (179.1 cm) Weight: 190 lb (86.2 kg) IBW/kg (Calculated) : 74.15  Vital Signs: Temp: 100.2 F (37.9 C) (10/10 0450) Temp Source: Oral (10/10 0450) BP: 89/60 (10/10 0450) Pulse Rate: 77 (10/10 0450)  Labs: Recent Labs    03/19/2019 1822 03/29/2019 1912 03/28/19 0220  HGB 12.8*  --  12.2*  HCT 38.8*  --  36.9*  PLT 163  --  170  LABPROT  --  25.7*  --   INR  --  2.4*  --   CREATININE 1.64*  --  2.32*    Estimated Creatinine Clearance: 28.9 mL/min (A) (by C-G formula based on SCr of 2.32 mg/dL (H)).   Medical History: Past Medical History:  Diagnosis Date  . Arthritis   . Bifascicular block    First degree AV block, right bundle branch block left anterior fascicular block.  Re-eval with event monitor 07/2017--cardiology  . Blood transfusion without reported diagnosis   . BPH with obstruction/lower urinary tract symptoms   . Carotid stenosis   . Chronic gout    due to renal impairment  . 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 Jimmy Footman.  GFR stable at 55 ml/min (Cr 1.28) 07/16/17.  Neph f/u 08/08/17, Cr up to 1.71, GFR 39 (in context of vol ovld & imp bladder emptying b/c out of flomax x 1 wk).  Cr  1.38 and GFR 50 ml/min as of 08/21/17 renal f/u.  Cr 1.27, GFR 56 on renal f/u 10/01/17. Cr 1.24, GFR 57 on 01/06/18. Cr 1.41, GFR 49 Jan 2020. Stab GFR 09/2018   . COPD (chronic obstructive pulmonary disease) ([place])   . Coronary artery disease    CABG 1995.  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 Diabetes mellitus without complication ([place]) 28/3151   New dx 10/15/16 by A1c criteria (6.6%)--recommended nutritionist referral/no meds at initial dx.  . Glaucoma   . H/O paroxysmal supraventricular tachycardia    with wide complex. +  Hx of a-fib as well.  Coumadin mgmt --nephrology  . History of renal transplant 1988  . Hyperlipidemia   . Hypertension   . ICD (implantable cardioverter-defibrillator) in place    Normal device check 01/28/16.  . Ischemic cardiomyopathy    Ischemic heart disease and mild depressed LV and RV function + DD and mild pulm HTN  . Post-transplant erythrocytosis   . 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  //  Echo 2/18: mild LVH, EF 45-50, diff HK, Gr 1 DD, severe MAC, mild Mitral Stenosis, normally functioning mechanical AVR (mean 12, peak 25).  Per cards, repeat echo planned for 2020.  Marland Kitchen Secondary hyperparathyroidism ([place])    parathyroidectomy done; subsequently has hypoparathyroidism, takes vit D  . Sinus bradycardia   . Syncope    He is s/p loop recorder insertion; as of Dr. Olin Pia f/u 01/16/16 he has had no further syncope and no significant arrhythmias.  He elected to keep the loop recorder in and was told to f/u 1 yr with Dr. Caryl Comes.   NORMAL DEVICE REMOTE TRANSMISSION--NO ATRIAL FIBRILLATION--as of 09/02/15.    Medications:  No current facility-administered medications on file prior to encounter.    Current Outpatient Medications on File Prior to Encounter  Medication Sig Dispense Refill  . allopurinol (  ZYLOPRIM) 100 MG tablet 1 tablet by mouth in the morning 2 tablets by mouth at night    . aspirin EC 81 MG tablet Take 1 tablet (81 mg total) by mouth daily. 90 tablet 3  . atorvastatin (LIPITOR) 80 MG tablet Take 1 tablet (80 mg total) by mouth daily. 90 tablet 3  . calcitRIOL (ROCALTROL) 0.5 MCG capsule Take 0.5 mcg by mouth daily. Take two tablets by mouth on Monday,wed,fri, sun. Take one tablet by mouth tues, thurs and sat.    . furosemide (LASIX) 40 MG tablet Take 40 mg by mouth daily.    Marland Kitchen lisinopril (PRINIVIL,ZESTRIL) 5 MG tablet Take 5 mg by mouth daily.    . Multiple Vitamins-Minerals (OCUVITE PO) Take 1 tablet  by mouth 2 (two) times a day.     . mycophenolate (CELLCEPT) 500 MG tablet Take 1,000 mg by mouth 2 (two) times daily.     . Naproxen Sodium (ALEVE PO) Take 1 tablet by mouth daily as needed (pain).    . tamsulosin (FLOMAX) 0.4 MG CAPS capsule Take 0.4 mg by mouth daily.     Marland Kitchen warfarin (COUMADIN) 2 MG tablet Take 2 mg by mouth. 7.5 mg M,W, F. All other days 9m    . warfarin (COUMADIN) 5 MG tablet TAKE 1 (ONE) TABLET AS DIRECTED  10     Assessment: 73y.o. male admitted with fevers/sepsis, h/o Afib and mechanical AVR, to continue Coumadin   Goal of Therapy:  INR 2.5-3.5 Monitor platelets by anticoagulation protocol: Yes   Plan:  F/U med rec and current Coumadin regimen Daily INR  Tana Trefry, GBronson Curb10/03/2019,5:17 AM

## 2019-03-28 NOTE — Consult Note (Signed)
[facility] Renal Consultation Note  Requesting MD: Dahal Indication for Consultation: renal transplant - A on CRF  HPI:  John Barton is a 73 y.o. male with past medical history significant for hypertension, COPD, coronary artery disease with ischemic cardiomyopathy status post ICD and also a Saint Jude aortic valve on anticoagulation.  Status post renal transplant in 1998 followed by Dr. Jimmy Footman at [facility], looks like creatinine may be in the mid ones.  He presented to [facility] hospital last evening with fever and chills as well as chest pain appeared to be pleuritic.  He has been diagnosed with UTI and pneumonia, COVID negative-is being admitted for IV antibiotics and management of sepsis-blood pressure has been low-was noted to be on lisinopril as an outpatient.  Creatinine last evening was 1.6 and is 2.3 at last check early this morning.  Therefore we are asked to assist with acute on chronic renal failure.  He appears comfortable to me at present-denies cough or any pain where that kidney is-denies dysuria or hematuria.  UA with large leukocytes-greater than 50 WBCs.  He is getting a fluid bolus, currently sitting in bedside chair there is no bed in his room.  Started on Flagyl, Vanco, cefepime.  Had a CT with contrast that showed perinephric stranding  Creatinine  Date/Time Value Ref Range Status  12/31/2018 1.3 0.6 - 1.3 Final  09/30/2018 1.4 (A) 0.6 - 1.3 Final  07/15/2018 1.4 (A) 0.6 - 1.3 Final  04/07/2018 1.4 (A) 0.6 - 1.3 Final  10/01/2017 1.3 0.6 - 1.3 Final  07/09/2017 1.3 0.6 - 1.3 Final   Creat  Date/Time Value Ref Range Status  11/17/2013 1.34  Final   Creatinine, Ser  Date/Time Value Ref Range Status  03/28/2019 02:20 AM 2.32 (H) 0.61 - 1.24 mg/dL Final  04/11/2019 06:22 PM 1.64 (H) 0.61 - 1.24 mg/dL Final  08/15/2017 09:49 AM 2.01 (H) 0.76 - 1.27 mg/dL Final  10/15/2016 08:58 AM 1.32 0.40 - 1.50 mg/dL Final  09/11/2012 08:20 AM 1.42 (H) 0.50 - 1.35 mg/dL  Final  08/29/2012 11:32 AM 1.7 (H) 0.4 - 1.5 mg/dL Final  12/11/2008 06:00 AM 1.77 (H) 0.4 - 1.5 mg/dL Final  12/09/2008 09:00 AM 1.49 0.4 - 1.5 mg/dL Final  12/07/2008 03:40 AM 1.41 0.4 - 1.5 mg/dL Final  12/04/2008 12:30 PM 1.3 0.4 - 1.5 mg/dL Final     PMHx:   Past Medical History:  Diagnosis Date  . Arthritis   . Bifascicular block    First degree AV block, right bundle branch block left anterior fascicular block.  Re-eval with event monitor 07/2017--cardiology  . Blood transfusion without reported diagnosis   . BPH with obstruction/lower urinary tract symptoms   . Carotid stenosis   . Chronic gout    due to renal impairment  . 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 Jimmy Footman.  GFR stable at 55 ml/min (Cr 1.28) 07/16/17.  Neph f/u 08/08/17, Cr up to 1.71, GFR 39 (in context of vol ovld & imp bladder emptying b/c out of flomax x 1 wk).  Cr  1.38 and GFR 50 ml/min as of 08/21/17 renal f/u.  Cr 1.27, GFR 56 on renal f/u 10/01/17. Cr 1.24, GFR 57 on 01/06/18. Cr 1.41, GFR 49 Jan 2020. Stab GFR 09/2018   . COPD (chronic obstructive pulmonary disease) ([place])   . Coronary artery disease    CABG 1995.  Prior inferior MI noted on Myoview  2010  . Diabetes mellitus without complication ([place]) 02/6044   New dx 10/15/16 by A1c criteria (6.6%)--recommended nutritionist referral/no meds at initial dx.  . Glaucoma   . H/O paroxysmal supraventricular tachycardia    with wide complex. + Hx of a-fib as well.  Coumadin mgmt --nephrology  . History of renal transplant 1988  . Hyperlipidemia   . Hypertension   . ICD (implantable cardioverter-defibrillator) in place    Normal device check 01/28/16.  . Ischemic cardiomyopathy    Ischemic heart disease and mild depressed LV and RV function + DD and mild pulm HTN  . Post-transplant erythrocytosis   . 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  //  Echo 2/18: mild LVH, EF 45-50, diff HK, Gr 1 DD, severe MAC, mild Mitral Stenosis, normally functioning mechanical AVR (mean 12, peak 25).  Per cards, repeat echo planned for 2020.  Marland Kitchen Secondary hyperparathyroidism ([place])    parathyroidectomy done; subsequently has hypoparathyroidism, takes vit D  . Sinus bradycardia   . Syncope    He is s/p loop recorder insertion; as of Dr. Olin Pia f/u 01/16/16 he has had no further syncope and no significant arrhythmias.  He elected to keep the loop recorder in and was told to f/u 1 yr with Dr. Caryl Comes.   NORMAL DEVICE REMOTE TRANSMISSION--NO ATRIAL FIBRILLATION--as of 09/02/15.    Past Surgical History:  Procedure Laterality Date  . AORTIC VALVE REPLACEMENT  1995   ST.Jude mechanical valve  . CARDIAC CATHETERIZATION  1995  . CARDIAC EVENT MONITOR  07/2017   PVCs, sinus brady---no significant abnormal findings.  . CORONARY ARTERY BYPASS GRAFT  1995   RCA andLAD  . ELECTROPHYSIOLOGY STUDY N/A 09/11/2012   Procedure: ELECTROPHYSIOLOGY STUDY;  Surgeon: Deboraha Sprang, MD;  Location: [facility] CATH LAB;  Service: Cardiovascular;  Laterality: N/A;  . 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 parathyroidectomy    . TRANSTHORACIC ECHOCARDIOGRAM  08/15/12; 08/03/16; 11/04/17   2014: Mild LVH and LV dilation, EF 50-55%, normal aortic valve gradients.  2018: EF 45-50%, normal functioning prosthetic AV, diffuse hypokinesis, grd I DD, mild mitral stenosis.  10/2017: EF 40-45%, global hypokinesis, grd II DD, mild MR, severe LAE, mild RV dysfxn, mild inc pulm press.    Family Hx:  Family History  Problem Relation Age of Onset  . Heart failure Father   . Heart disease Father   . Hyperlipidemia Father   . Hypertension Father   . Diabetes Father   . Arthritis Mother   . Heart disease Mother   . Hyperlipidemia Mother   . Hypertension Mother    . Heart failure Mother   . Heart disease Brother   . Diabetes Daughter   . Alcohol abuse Brother   . Liver disease Brother        Liver transplant, ETOH    Social History:  reports that he quit smoking about 4 years ago. His smoking use included cigars. He has never used smokeless tobacco. He reports current alcohol use of about 14.0 standard drinks of alcohol per week. He reports that he does not use drugs.  Allergies: No Known Allergies  Medications: Prior to Admission medications   Medication Sig Start Date End Date Taking? Authorizing Provider  allopurinol (ZYLOPRIM) 100 MG tablet Take 100-200 mg by mouth 2 (two) times daily. 1 tablet by mouth in the morning 2 tablets  by mouth at night   Yes [provider]  aspirin EC 81 MG tablet Take 1 tablet (81 mg total) by mouth daily. 11/03/18  Yes Deboraha Sprang, MD  atorvastatin (LIPITOR) 80 MG tablet Take 1 tablet (80 mg total) by mouth daily. 04/17/16  Yes McGowen, [name], MD  calcitRIOL (ROCALTROL) 0.5 MCG capsule Take 0.5 mcg by mouth daily.    Yes [provider]  furosemide (LASIX) 40 MG tablet Take 40 mg by mouth daily.   Yes [provider]  lisinopril (PRINIVIL,ZESTRIL) 5 MG tablet Take 5 mg by mouth daily.   Yes [provider]  Multiple Vitamins-Minerals (OCUVITE PO) Take 1 tablet by mouth 2 (two) times a day.    Yes [provider]  mycophenolate (CELLCEPT) 500 MG tablet Take 1,000 mg by mouth 2 (two) times daily.    Yes [provider]  Naproxen Sodium (ALEVE PO) Take 1 tablet by mouth daily as needed (pain).   Yes [provider]  tamsulosin (FLOMAX) 0.4 MG CAPS capsule Take 0.4 mg by mouth daily.  03/21/16  Yes [provider]  warfarin (COUMADIN) 2 MG tablet Take 7 mg by mouth See admin instructions. Tuesday & Thursday patient takes a  total dose of 7 mg. Patient takes with 5 mg to equal total dose of 7 mg.    [provider]  warfarin  (COUMADIN) 5 MG tablet Take 5 mg by mouth See admin instructions. Monday,Wednesday,Friday,Saturday & Sunday patient takes 5 mg.  On Tuesday and Thursday patient takes 7 mg. 11/02/17   [provider]    I have reviewed the patient's current medications.  Labs:  Results for orders placed or performed during the hospital encounter of 04/13/2019 (from the past 48 hour(s))  CBC with Differential     Status: Abnormal   Collection Time: 04/13/2019  6:22 PM  Result Value Ref Range   WBC 21.4 (H) 4.0 - 10.5 K/uL   RBC 4.42 4.22 - 5.81 MIL/uL   Hemoglobin 12.8 (L) 13.0 - 17.0 g/dL   HCT 38.8 (L) 39.0 - 52.0 %   MCV 87.8 80.0 - 100.0 fL   MCH 29.0 26.0 - 34.0 pg   MCHC 33.0 30.0 - 36.0 g/dL   RDW 13.9 11.5 - 15.5 %   Platelets 163 150 - 400 K/uL   nRBC 0.0 0.0 - 0.2 %   Neutrophils Relative % 94 %   Neutro Abs 20.1 (H) 1.7 - 7.7 K/uL   Lymphocytes Relative 1 %   Lymphs Abs 0.3 (L) 0.7 - 4.0 K/uL   Monocytes Relative 4 %   Monocytes Absolute 0.9 0.1 - 1.0 K/uL   Eosinophils Relative 0 %   Eosinophils Absolute 0.0 0.0 - 0.5 K/uL   Basophils Relative 0 %   Basophils Absolute 0.0 0.0 - 0.1 K/uL   Immature Granulocytes 1 %   Abs Immature Granulocytes 0.12 (H) 0.00 - 0.07 K/uL    Comment: Performed at [facility], [address] [place], [place] [address]  Comprehensive metabolic panel     Status: Abnormal   Collection Time: 03/31/2019  6:22 PM  Result Value Ref Range   Sodium 134 (L) 135 - 145 mmol/L   Potassium 4.1 3.5 - 5.1 mmol/L   Chloride 99 98 - 111 mmol/L   CO2 22 22 - 32 mmol/L   Glucose, Bld 136 (H) 70 - 99 mg/dL   BUN 35 (H) 8 - 23 mg/dL   Creatinine,  Ser 1.64 (H) 0.61 - 1.24 mg/dL   Calcium 9.1 8.9 - 10.3 mg/dL   Total Protein 7.1 6.5 - 8.1 g/dL   Albumin 4.3 3.5 - 5.0 g/dL   AST 23 15 - 41 U/L   ALT 19 0 - 44 U/L   Alkaline Phosphatase 85 38 - 126 U/L   Total Bilirubin 1.1 0.3 - 1.2 mg/dL   GFR calc non Af Amer 40 (L) >60 mL/min   GFR calc Af Amer 47 (L) >60  mL/min   Anion gap 13 5 - 15    Comment: Performed at [facility] [address] [place], [place] [address]  Lactic acid, plasma     Status: None   Collection Time: 04/12/2019  6:22 PM  Result Value Ref Range   Lactic Acid, Venous 0.9 0.5 - 1.9 mmol/L    Comment: Performed at [facility] [address], [place], [place] [address]  Lactate dehydrogenase     Status: Abnormal   Collection Time: 03/30/2019  6:22 PM  Result Value Ref Range   LDH 278 (H) 98 - 192 U/L    Comment: Performed at [facility], [place] [address], [place], [place] [address]  Procalcitonin     Status: None   Collection Time: 04/12/2019  6:22 PM  Result Value Ref Range   Procalcitonin 0.18 ng/mL    Comment:        Interpretation: PCT (Procalcitonin) <= 0.5 ng/mL: Systemic infection (sepsis) is not likely. Local bacterial infection is possible. (NOTE)       Sepsis PCT Algorithm           Lower Respiratory Tract                                      Infection PCT Algorithm    ----------------------------     ----------------------------         PCT < 0.25 ng/mL                PCT < 0.10 ng/mL         Strongly encourage             Strongly discourage   discontinuation of antibiotics    initiation of antibiotics    ----------------------------     -----------------------------       PCT 0.25 - 0.50 ng/mL            PCT 0.10 - 0.25 ng/mL               OR       >80% decrease in PCT            Discourage initiation of                                            antibiotics      Encourage discontinuation           of antibiotics    ----------------------------     -----------------------------         PCT >= 0.50 ng/mL              PCT 0.26 - 0.50 ng/mL               AND        <  80% decrease in PCT             Encourage initiation of                                             antibiotics       Encourage continuation           of antibiotics    ----------------------------      -----------------------------        PCT >= 0.50 ng/mL                  PCT > 0.50 ng/mL               AND         increase in PCT                  Strongly encourage                                      initiation of antibiotics    Strongly encourage escalation           of antibiotics                                     -----------------------------                                           PCT <= 0.25 ng/mL                                                 OR                                        > 80% decrease in PCT                                     Discontinue / Do not initiate                                             antibiotics Performed at [facility], [address], [place], [place] [address]   Triglycerides     Status: None   Collection Time: 03/31/2019  6:22 PM  Result Value Ref Range   Triglycerides 91 <150 mg/dL    Comment: Performed at [facility] [address], [place], [place] [address]  SARS CORONAVIRUS 2 (TAT 6-24 HRS) Nasopharyngeal Nasopharyngeal Swab     Status: None   Collection Time: 03/22/2019  6:23 PM   Specimen: Nasopharyngeal Swab  Result Value Ref Range   SARS Coronavirus 2 NEGATIVE NEGATIVE    Comment: (NOTE) SARS-CoV-2 target nucleic acids are  NOT DETECTED. The SARS-CoV-2 RNA is generally detectable in upper and lower respiratory specimens during the acute phase of infection. Negative results do not preclude SARS-CoV-2 infection, do not rule out co-infections with other pathogens, and should not be used as the sole basis for treatment or other patient management decisions. Negative results must be combined with clinical observations, patient history, and epidemiological information. The expected result is Negative. Fact Sheet for Patients: SugarRoll.be Fact Sheet for Healthcare Providers: https://www.woods-mathews.com/ This test is not yet approved or cleared by the [place] FDA and   has been authorized for detection and/or diagnosis of SARS-CoV-2 by FDA under an Emergency Use Authorization (EUA). This EUA will remain  in effect (meaning this test can be used) for the duration of the COVID-19 declaration under Section 56 4(b)(1) of the Act, 21 U.S.C. section 360bbb-3(b)(1), unless the authorization is terminated or revoked sooner. Performed at [facility], [place] [address] [place], [place] [address]   D-dimer, quantitative     Status: Abnormal   Collection Time: 04/05/2019  7:12 PM  Result Value Ref Range   D-Dimer, Quant 3.86 (H) 0.00 - 0.50 ug/mL-FEU    Comment: (NOTE) At the manufacturer cut-off of 0.50 ug/mL FEU, this assay has been documented to exclude PE with a sensitivity and negative predictive value of 97 to 99%.  At this time, this assay has not been approved by the FDA to exclude DVT/VTE. Results should be correlated with clinical presentation. Performed at [facility], [place] [address], [place], [place] [address]   Ferritin     Status: None   Collection Time: 04/04/2019  7:12 PM  Result Value Ref Range   Ferritin 293 24 - 336 ng/mL    Comment: Performed at [facility], [place] [address] [place], [place] [address]  Fibrinogen     Status: Abnormal   Collection Time: 03/29/2019  7:12 PM  Result Value Ref Range   Fibrinogen 568 (H) 210 - 475 mg/dL    Comment: Performed at [facility] [address] [place], [place] [address]  C-reactive protein     Status: Abnormal   Collection Time: 04/15/2019  7:12 PM  Result Value Ref Range   CRP 3.3 (H) <1.0 mg/dL    Comment: Performed at [facility] [address], [place], [place] [address]  Protime-INR     Status: Abnormal   Collection Time: 03/25/2019  7:12 PM  Result Value Ref Range   Prothrombin Time 25.7 (H) 11.4 - 15.2 seconds   INR 2.4 (H) 0.8 - 1.2    Comment: (NOTE) INR goal varies based on device and disease states. Performed at [facility], [place] [address] [place], [place] [address]   Urinalysis, Routine w reflex microscopic     Status: Abnormal   Collection Time: 04/13/2019 10:46 PM  Result Value Ref Range   Color, Urine YELLOW YELLOW   APPearance CLOUDY (A) CLEAR   Specific Gravity, Urine 1.031 (H) 1.005 - 1.030   pH 5.0 5.0 - 8.0   Glucose, UA NEGATIVE NEGATIVE mg/dL   Hgb urine dipstick NEGATIVE NEGATIVE   Bilirubin Urine NEGATIVE NEGATIVE   Ketones, ur 5 (A) NEGATIVE mg/dL   Protein, ur 100 (A) NEGATIVE mg/dL   Nitrite NEGATIVE NEGATIVE   Leukocytes,Ua LARGE (A) NEGATIVE   RBC / HPF 6-10 0 - 5 RBC/hpf   WBC, UA >50 (H) 0 - 5 WBC/hpf   Bacteria, UA RARE (A)  NONE SEEN   Squamous Epithelial / LPF 0-5 0 - 5   WBC Clumps PRESENT    Mucus PRESENT     Comment: Performed at [facility], [place] [address] [place], [place] [address]  Lactic acid, plasma     Status: None   Collection Time: 03/28/19  2:20 AM  Result Value Ref Range   Lactic Acid, Venous 1.6 0.5 - 1.9 mmol/L    Comment: Performed at [facility] [address], [place], [place] [address]  Comprehensive metabolic panel     Status: Abnormal   Collection Time: 03/28/19  2:20 AM  Result Value Ref Range   Sodium 136 135 - 145 mmol/L   Potassium 4.3 3.5 - 5.1 mmol/L   Chloride 100 98 - 111 mmol/L   CO2 22 22 - 32 mmol/L   Glucose, Bld 157 (H) 70 - 99 mg/dL   BUN 41 (H) 8 - 23 mg/dL   Creatinine, Ser 2.32 (H) 0.61 - 1.24 mg/dL   Calcium 8.8 (L) 8.9 - 10.3 mg/dL   Total Protein 6.5 6.5 - 8.1 g/dL   Albumin 3.9 3.5 - 5.0 g/dL   AST 28 15 - 41 U/L   ALT 19 0 - 44 U/L   Alkaline Phosphatase 78 38 - 126 U/L   Total Bilirubin 1.3 (H) 0.3 - 1.2 mg/dL   GFR calc non Af Amer 26 (L) >60 mL/min   GFR calc Af Amer 31 (L) >60 mL/min   Anion gap 14 5 - 15    Comment: Performed at [facility] [address], [place], [place] [address]  CBC     Status: Abnormal   Collection Time: 03/28/19  2:20 AM  Result Value Ref Range   WBC 33.7 (H) 4.0 - 10.5 K/uL   RBC 4.20  (L) 4.22 - 5.81 MIL/uL   Hemoglobin 12.2 (L) 13.0 - 17.0 g/dL   HCT 36.9 (L) 39.0 - 52.0 %   MCV 87.9 80.0 - 100.0 fL   MCH 29.0 26.0 - 34.0 pg   MCHC 33.1 30.0 - 36.0 g/dL   RDW 13.9 11.5 - 15.5 %   Platelets 170 150 - 400 K/uL   nRBC 0.0 0.0 - 0.2 %    Comment: Performed at [facility], [place] [address] [place], [place] [address]  Protime-INR     Status: Abnormal   Collection Time: 03/28/19  7:08 AM  Result Value Ref Range   Prothrombin Time 23.2 (H) 11.4 - 15.2 seconds   INR 2.1 (H) 0.8 - 1.2    Comment: (NOTE) INR goal varies based on device and disease states. Performed at [facility], [place] [address], [place], [place] [address]   MRSA PCR Screening     Status: None   Collection Time: 03/28/19  8:34 AM   Specimen: Nasal Mucosa; Nasopharyngeal  Result Value Ref Range   MRSA by PCR NEGATIVE NEGATIVE    Comment:        The GeneXpert MRSA Assay (FDA approved for NASAL specimens only), is one component of a comprehensive MRSA colonization surveillance program. It is not intended to diagnose MRSA infection nor to guide or monitor treatment for MRSA infections. Performed at [facility], [place] [address] [place],  [address]      ROS:  A comprehensive review of systems was negative except for: Fever, chills  Physical Exam: Vitals:   03/28/19 1126 03/28/19 1235  BP: (!) 89/42 Marland Kitchen)  107/58  Pulse:  82  Resp:    Temp:    SpO2:       General: Well-appearing white male.  Alert and in no acute distress HEENT: Pupils are equally round and reactive to light, extraocular motions are intact, mucous membranes are moist Neck: No JVD Heart: Regular rate and rhythm Lungs: Clear to auscultation bilaterally Abdomen: Soft, nontender, nondistended.  Kidney allograft in left lower quadrant is without tenderness Extremities: No edema Skin: Warm and dry Neuro: Alert and nonfocal  Assessment/Plan: 73 year old white male status post kidney transplant 28  years ago.  Now presents with a sepsis type syndrome with evidence of UTI 1.Renal-status post renal transplant-sounds like living from his sister 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  He is on CellCept only for immunosuppression.  Baseline creatinine appears to be in the mid ones, patient thinks is about 1.3.  He now has suffered acute on chronic renal failure in the setting of sepsis type syndrome/hypotension on an ACE inhibitor and UTI.  Agree with management so far, receiving IV fluids for blood pressure support, placed on broad-spectrum antibiotics and lisinopril is on hold.  Will follow closely with you urine output and labs.  I think this transplant has proved its resiliency by lasting so long, hopefully will be able to get through this as well. 2. Hypertension/volume  -hypotension related to sepsis-on ACE inhibitor as outpatient.  Could exacerbate the ATN.  Lisinopril on hold and volume given for blood pressure support 3.  ID-thought to have UTI with perinephric stranding and also possibly a pulmonary process.  Per primary team has been placed on cefepime, Vanco, Flagyl 4. Anemia  -is not an issue 5.  Chronic immunosuppression-continue CellCept 1000 twice daily-he is not on any other agents  Louis Meckel 03/28/2019, 1:12 PM

## 2019-03-28 NOTE — Progress Notes (Signed)
Patient had two occurrences of unmeasured loose stools since arrival on unit. MD notified.

## 2019-03-28 NOTE — H&P (Signed)
History and Physical   John Barton DOB: 08-02-1945 DOA: 04/02/2019  Referring MD/NP/PA: Dr. Alvino Chapel  PCP: Tammi Sou, MD   Outpatient Specialists: None  Patient coming from: Home  Chief Complaint: Fever and tremors  HPI: John Barton is a 73 y.o. male with medical history significant of renal transplant on chronic anticoagulation, osteoarthritis, BPH, hypertension, coronary artery disease who presents with sudden onset of fever chills and tremors at home.  Patient denied any sick contact.  Denied any nausea or vomiting.  He denied any dysuria but has chest pain on the right side.  It appears pleuritic in nature.  Patient seen and evaluated in the ER.  He was found to have low-grade sepsis as well as possible UTI and pneumonia.  His COVID-19 screen is negative.  Patient is being admitted for treatment of early sepsis.  Patient is immunosuppressed from his kidney transplant medications.  He has been compliant with medications..  ED Course: Temperature is 100.2 blood pressure 86/57 pulse 104 respirate of 32 oxygen sat 89% on room air.  White count is 21.4 thousand hemoglobin 12.2 and platelets 170.  Sodium 136 potassium 4.3 chloride 100 CO2 22 BUN 41 creatinine 2.32 and calcium 8.8.  INR 2.2 and glucose 157 lactic acid 0.6.  D-dimer is 3.86 fibrinogen 568.  Showed cloudy urine with large leukocytes.  WBC more than 50 with rare bacteria.  COVID-19 again negative.  Chest x-ray showed streaky airspace opacities in the left lung base possibly consolidation. Pelvis shows a 5.1 cm infrarenal abdominal aortic aneurysm.  Also left lower quadrant renal transplant with mild perinephric stranding no hydronephrosis.  1.8 cm bladder calculus.  Patient initiated on IV antibiotics and is being admitted for treatment.  Review of Systems: As per HPI otherwise 10 point review of systems negative.    Past Medical History:  Diagnosis Date  . Arthritis   . Bifascicular block    First  degree AV block, right bundle branch block left anterior fascicular block.  Re-eval with event monitor 07/2017--cardiology  . Blood transfusion without reported diagnosis   . BPH with obstruction/lower urinary tract symptoms   . Carotid stenosis   . Chronic gout    due to renal impairment  . 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 Jimmy Footman.  GFR stable at 55 ml/min (Cr 1.28) 07/16/17.  Neph f/u 08/08/17, Cr up to 1.71, GFR 39 (in context of vol ovld & imp bladder emptying b/c out of flomax x 1 wk).  Cr  1.38 and GFR 50 ml/min as of 08/21/17 renal f/u.  Cr 1.27, GFR 56 on renal f/u 10/01/17. Cr 1.24, GFR 57 on 01/06/18. Cr 1.41, GFR 49 Jan 2020. Stab GFR 09/2018   . COPD (chronic obstructive pulmonary disease) ([place])   . Coronary artery disease    CABG 1995.  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 Diabetes mellitus without complication ([place]) 25/3664   New dx 10/15/16 by A1c criteria (6.6%)--recommended nutritionist referral/no meds at initial dx.  . Glaucoma   . H/O paroxysmal supraventricular tachycardia    with wide complex. + Hx of a-fib as well.  Coumadin mgmt --nephrology  . History of renal transplant 1988  . Hyperlipidemia   . Hypertension   . ICD (implantable cardioverter-defibrillator) in place    Normal device check 01/28/16.  . Ischemic cardiomyopathy    Ischemic heart disease and mild depressed LV and RV function +  DD and mild pulm HTN  . Post-transplant erythrocytosis   . 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  //  Echo 2/18: mild LVH, EF 45-50, diff HK, Gr 1 DD, severe MAC, mild Mitral Stenosis, normally functioning mechanical AVR (mean 12, peak 25).  Per cards, repeat echo planned for 2020.  Marland Kitchen Secondary hyperparathyroidism ([place])    parathyroidectomy done; subsequently has hypoparathyroidism, takes vit D  . Sinus bradycardia   .  Syncope    He is s/p loop recorder insertion; as of Dr. Olin Pia f/u 01/16/16 he has had no further syncope and no significant arrhythmias.  He elected to keep the loop recorder in and was told to f/u 1 yr with Dr. Caryl Comes.   NORMAL DEVICE REMOTE TRANSMISSION--NO ATRIAL FIBRILLATION--as of 09/02/15.    Past Surgical History:  Procedure Laterality Date  . AORTIC VALVE REPLACEMENT  1995   [facility] mechanical valve  . CARDIAC CATHETERIZATION  1995  . CARDIAC EVENT MONITOR  07/2017   PVCs, sinus brady---no significant abnormal findings.  . CORONARY ARTERY BYPASS GRAFT  1995   RCA andLAD  . ELECTROPHYSIOLOGY STUDY N/A 09/11/2012   Procedure: ELECTROPHYSIOLOGY STUDY;  Surgeon: Deboraha Sprang, MD;  Location[facility] LAB;  Service: Cardiovascular;  Laterality: N/A;  . 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 parathyroidectomy    . TRANSTHORACIC ECHOCARDIOGRAM  08/15/12; 08/03/16; 11/04/17   2014: Mild LVH and LV dilation, EF 50-55%, normal aortic valve gradients.  2018: EF 45-50%, normal functioning prosthetic AV, diffuse hypokinesis, grd I DD, mild mitral stenosis.  10/2017: EF 40-45%, global hypokinesis, grd II DD, mild MR, severe LAE, mild RV dysfxn, mild inc pulm press.     reports that he quit smoking about 4 years ago. His smoking use included cigars. He has never used smokeless tobacco. He reports current alcohol use of about 14.0 standard drinks of alcohol per week. He reports that he does not use drugs.  No Known Allergies  Family History  Problem Relation Age of Onset  . Heart failure Father   . Heart disease Father   . Hyperlipidemia Father   . Hypertension Father   . Diabetes Father   . Arthritis Mother   . Heart disease Mother   . Hyperlipidemia Mother   . Hypertension Mother   . Heart failure Mother   . Heart disease Brother   . Diabetes Daughter   . Alcohol abuse Brother   . Liver  disease Brother        Liver transplant, ETOH     Prior to Admission medications   Medication Sig Start Date End Date Taking? Authorizing Provider  allopurinol (ZYLOPRIM) 100 MG tablet 1 tablet by mouth in the morning 2 tablets by mouth at night    [provider]  aspirin EC 81 MG tablet Take 1 tablet (81 mg total) by mouth daily. 11/03/18   Deboraha Sprang, MD  atorvastatin (LIPITOR) 80 MG tablet Take 1 tablet (80 mg total) by mouth daily. 04/17/16   McGowen, [name], MD  calcitRIOL (ROCALTROL) 0.5 MCG capsule Take 0.5 mcg by mouth daily. Take two tablets by mouth on Monday,wed,fri, sun. Take one tablet by mouth tues, thurs and sat.    [provider]  furosemide (LASIX) 40 MG tablet Take 40 mg by mouth daily.    [provider]  lisinopril (PRINIVIL,ZESTRIL) 5 MG  tablet Take 5 mg by mouth daily.    [provider]  Multiple Vitamins-Minerals (OCUVITE PO) Take 1 tablet by mouth 2 (two) times a day.     [provider]  mycophenolate (CELLCEPT) 500 MG tablet Take 1,000 mg by mouth 2 (two) times daily.     [provider]  Naproxen Sodium (ALEVE PO) Take 1 tablet by mouth daily as needed (pain).    [provider]  tamsulosin (FLOMAX) 0.4 MG CAPS capsule Take 0.4 mg by mouth daily.  03/21/16   [provider]  warfarin (COUMADIN) 2 MG tablet Take 2 mg by mouth. 7.5 mg M,W, F. All other days 13m    [provider]  warfarin (COUMADIN) 5 MG tablet TAKE 1 (ONE) TABLET AS DIRECTED 11/02/17   [provider]    Physical Exam: Vitals:   04/15/2019 2345 03/28/19 0000 03/28/19 0015 03/28/19 0030  BP: 115/85 (!) 107/54 (!) 100/57 (!) 122/58  Pulse: 87 87 90 76  Resp: (!) 21 (!) 29 (!) 32 (!) 22  Temp:      TempSrc:      SpO2: (!) 89% 92% 91% 92%  Weight:      Height:          Constitutional: Tremulous, worried Vitals:   04/01/2019 2345 03/28/19 0000 03/28/19 0015 03/28/19 0030  BP: 115/85 (!) 107/54 (!)  100/57 (!) 122/58  Pulse: 87 87 90 76  Resp: (!) 21 (!) 29 (!) 32 (!) 22  Temp:      TempSrc:      SpO2: (!) 89% 92% 91% 92%  Weight:      Height:       Eyes: PERRL, lids and conjunctivae normal ENMT: Mucous membranes are dry. Posterior pharynx clear of any exudate or lesions.Normal dentition.  Neck: normal, supple, no masses, no thyromegaly Respiratory: clear to auscultation bilaterally, no wheezing, no crackles. Normal respiratory effort. No accessory muscle use.  Cardiovascular: Sinus tachycardia, no murmurs / rubs / gallops. No extremity edema. 2+ pedal pulses. No carotid bruits.  Abdomen: no tenderness, no masses palpated. No hepatosplenomegaly. Bowel sounds positive.  Musculoskeletal: no clubbing / cyanosis. No joint deformity upper and lower extremities. Good ROM, no contractures. Normal muscle tone.  Skin: no rashes, lesions, ulcers. No induration Neurologic: CN 2-12 grossly intact. Sensation intact, DTR normal. Strength 5/5 in all 4.  Psychiatric: Normal judgment and insight. Alert and oriented x 3.  Anxious.     Labs on Admission: I have personally reviewed following labs and imaging studies  CBC: Recent Labs  Lab 03/24/2019 1822  WBC 21.4*  NEUTROABS 20.1*  HGB 12.8*  HCT 38.8*  MCV 87.8  PLT 1009  Basic Metabolic Panel: Recent Labs  Lab 04/04/2019 1822  NA 134*  K 4.1  CL 99  CO2 22  GLUCOSE 136*  BUN 35*  CREATININE 1.64*  CALCIUM 9.1   GFR: Estimated Creatinine Clearance: 40.8 mL/min (A) (by C-G formula based on SCr of 1.64 mg/dL (H)). Liver Function Tests: Recent Labs  Lab 03/21/2019 1822  AST 23  ALT 19  ALKPHOS 85  BILITOT 1.1  PROT 7.1  ALBUMIN 4.3   No results for input(s): LIPASE, AMYLASE in the last 168 hours. No results for input(s): AMMONIA in the last 168 hours. Coagulation Profile: Recent Labs  Lab 04/12/2019 1912  INR 2.4*   Cardiac Enzymes: No results for input(s): CKTOTAL, CKMB, CKMBINDEX, TROPONINI in the last 168 hours. BNP  (last 3 results) No  results for input(s): PROBNP in the last 8760 hours. HbA1C: No results for input(s): HGBA1C in the last 72 hours. CBG: No results for input(s): GLUCAP in the last 168 hours. Lipid Profile: Recent Labs    04/06/2019 1822  TRIG 91   Thyroid Function Tests: No results for input(s): TSH, T4TOTAL, FREET4, T3FREE, THYROIDAB in the last 72 hours. Anemia Panel: Recent Labs    03/23/2019 1912  FERRITIN 293   Urine analysis:    Component Value Date/Time   COLORURINE YELLOW 04/06/2019 2246   APPEARANCEUR CLOUDY (A) 03/25/2019 2246   LABSPEC 1.031 (H) 03/26/2019 2246   PHURINE 5.0 04/05/2019 2246   GLUCOSEU NEGATIVE 04/09/2019 2246   HGBUR NEGATIVE 03/22/2019 2246   BILIRUBINUR NEGATIVE 04/09/2019 2246   BILIRUBINUR Negative 11/17/2013   KETONESUR 5 (A) 04/17/2019 2246   PROTEINUR 100 (A) 04/07/2019 2246   UROBILINOGEN 0.2 12/04/2008 1835   NITRITE NEGATIVE 03/21/2019 2246   LEUKOCYTESUR LARGE (A) 03/26/2019 2246   Sepsis Labs: _0 (procalcitonin:4,lacticidven:4) ) Recent Results (from the past 240 hour(s))  SARS CORONAVIRUS 2 (TAT 6-24 HRS) Nasopharyngeal Nasopharyngeal Swab     Status: None   Collection Time: 04/09/2019  6:23 PM   Specimen: Nasopharyngeal Swab  Result Value Ref Range Status   SARS Coronavirus 2 NEGATIVE NEGATIVE Final    Comment: (NOTE) SARS-CoV-2 target nucleic acids are NOT DETECTED. The SARS-CoV-2 RNA is generally detectable in upper and lower respiratory specimens during the acute phase of infection. Negative results do not preclude SARS-CoV-2 infection, do not rule out co-infections with other pathogens, and should not be used as the sole basis for treatment or other patient management decisions. Negative results must be combined with clinical observations, patient history, and epidemiological information. The expected result is Negative. Fact Sheet for Patients: SugarRoll.be Fact Sheet for  Healthcare Providers: https://www.woods-mathews.com/ This test is not yet approved or cleared by the [place] FDA and  has been authorized for detection and/or diagnosis of SARS-CoV-2 by FDA under an Emergency Use Authorization (EUA). This EUA will remain  in effect (meaning this test can be used) for the duration of the COVID-19 declaration under Section 56 4(b)(1) of the Act, 21 U.S.C. section 360bbb-3(b)(1), unless the authorization is terminated or revoked sooner. Performed at [facility], [place] [address] [place], [place] [address]      Radiological Exams on Admission: Ct Abdomen Pelvis W Contrast  Result Date: 03/19/2019 CLINICAL DATA:  Fever.  No abdominal complaints. EXAM: CT ABDOMEN AND PELVIS WITH CONTRAST TECHNIQUE: Multidetector CT imaging of the abdomen and pelvis was performed using the standard protocol following bolus administration of intravenous contrast. CONTRAST:  39m OMNIPAQUE IOHEXOL 300 MG/ML  SOLN COMPARISON:  None. FINDINGS: Lower chest: No acute abnormality. Bibasilar atelectasis/scarring. Dense coronary, aortic valve, and mitral valve calcifications. Hepatobiliary: No focal liver abnormality. Tiny gallstones. No gallbladder wall thickening or biliary dilatation. Pancreas: Unremarkable. No pancreatic ductal dilatation or surrounding inflammatory changes. Spleen: Normal in size without focal abnormality. Adrenals/Urinary Tract: The adrenal glands are unremarkable. Absent left kidney. Severely atrophic right kidney. Left lower quadrant transplant kidney demonstrates mild perinephric stranding but is otherwise unremarkable. No hydronephrosis. 1.8 cm bladder calculus. Small left lateral bladder diverticulum. No bladder wall thickening. Stomach/Bowel: Stomach is within normal limits. Appendix appears normal. No evidence of bowel wall thickening, distention, or inflammatory changes. Vascular/Lymphatic: 5.1 cm infrarenal abdominal aortic aneurysm. Extensive  aortoiliac and branch vessel atherosclerotic calcification. No enlarged abdominal or pelvic lymph nodes. Reproductive: Borderline prostatomegaly with coarse central calcifications.  Other: Small fat containing umbilical hernia. No free fluid or pneumoperitoneum. Musculoskeletal: No acute or significant osseous findings. Chronic distal sacral fracture centrally at S4-L5. IMPRESSION: 1.  No acute intra-abdominal process. 2. 5.1 cm infrarenal abdominal aortic aneurysm. Recommend followup by abdomen and pelvis CTA in 3-6 months, and vascular surgery referral/consultation if not already obtained. This recommendation follows ACR consensus guidelines: White Paper of the ACR Incidental Findings Committee II on Vascular Findings. J Am Coll Radiol 2013; 10:789-794. Aortic aneurysm NOS (ICD10-I71.9) 3. Left lower quadrant renal transplant with mild perinephric stranding, nonspecific. No hydronephrosis. 4. 1.8 cm bladder calculus. 5. Cholelithiasis. 6.  Aortic atherosclerosis (ICD10-I70.0). Electronically Signed   By: Titus Dubin M.D.   On: 03/25/2019 22:28   Dg Chest Portable 1 View  Result Date: 03/19/2019 CLINICAL DATA:  Fever. EXAM: PORTABLE CHEST 1 VIEW COMPARISON:  May 06, 2018 FINDINGS: Cardiomediastinal silhouette is normal. Mediastinal contours appear intact. Calcific atherosclerotic disease of the aorta. Post CABG postsurgical changes. Streaky airspace opacities in the left lung base. Osseous structures are without acute abnormality. Soft tissues are grossly normal. IMPRESSION: 1. Streaky airspace opacities in the left lung base may represent atelectasis or infectious airspace consolidation. 2. Calcific atherosclerotic disease of the aorta. Electronically Signed   By: Fidela Salisbury M.D.   On: 03/26/2019 18:49      Assessment/Plan Principal Problem:   Sepsis secondary to UTI [facility]) Active Problems:   Hx of CABG   Kidney replaced by transplant   Community acquired pneumonia     #1 sepsis  due to UTI and pneumonia: Patient will be admitted to a monitored bed.  IV vancomycin and cefepime initiated.  Blood and urine cultures sent.  Patient is immunocompromise relatively.  Continue with aggressive care until cultures are back.  IV fluid and supportive care.  #2 UTI: May be complex UTI with bladder calculus as noted.  Treat with antibiotics and consider urology referral.  #3 community-acquired pneumonia: Based on x-ray findings and mild hypoxia.  Continue treatment as above with antibiotics and cultures.  #4 history of kidney transplant: Continue immunosuppressive therapy.  #5 coronary artery disease: Status post coronary artery bypass grafting.  Continue treatment.   DVT prophylaxis: Warfarin Code Status: Full code Family Communication: No family at bedside Disposition Plan: Home Consults called: None Admission status: Inpatient  Severity of Illness: The appropriate patient status for this patient is INPATIENT. Inpatient status is judged to be reasonable and necessary in order to provide the required intensity of service to ensure the patient's safety. The patient's presenting symptoms, physical exam findings, and initial radiographic and laboratory data in the context of their chronic comorbidities is felt to place them at high risk for further clinical deterioration. Furthermore, it is not anticipated that the patient will be medically stable for discharge from the hospital within 2 midnights of admission. The following factors support the patient status of inpatient.   " The patient's presenting symptoms include fever and chills. " The worrisome physical exam findings include tremulous. " The initial radiographic and laboratory data are worrisome because of evidence of UTI and sepsis. " The chronic co-morbidities include kidney transplant.   * I certify that at the point of admission it is my clinical judgment that the patient will require inpatient hospital care spanning  beyond 2 midnights from the point of admission due to high intensity of service, high risk for further deterioration and high frequency of surveillance required.Barbette Merino MD [facility] Pager [phone]  If 7PM-7AM, please contact night-coverage [email or web address] Password [email or web address]  03/28/2019, 12:52 AM

## 2019-03-28 NOTE — Progress Notes (Signed)
Pharmacy Antibiotic Note  John Barton is a 73 y.o. male admitted on 04/13/2019 with sepsis. WBC is elevated at 33.7 and Tmax 100.2.  Patient is s/p renal transplant and on immunosuppression. Pharmacy has been consulted for vancomycin and cefepime dosing for sepsis.   Patient was started on cefepime 2g q12h and planned for vancomycin 1250mg  q24h to be administered starting at 2200 on 10/10. Scr increased from 1.64 to 2.32. Current CrCl is 28.9 ml/min. Given increase in Scr and current CrCl will renally dose adjust cefepime and discontinue scheduled vancomycin per AUC dosing and transition to vancomycin random level dosing.   Plan: Change cefepime to 2g q24h - next dose at 2000 on 10/10 (24 hours after last dose) Discontinue scheduled vancomycin regimen  Obtain vancomycin random level at 0500 on 10/11 Monitor renal function, C/S, and clinical progression   Height: 5' 10.5" (179.1 cm) Weight: 190 lb (86.2 kg) IBW/kg (Calculated) : 74.15  Temp (24hrs), Avg:99.4 F (37.4 C), Min:98.6 F (37 C), Max:100.2 F (37.9 C)  Recent Labs  Lab 04/08/2019 1822 03/28/19 0220  WBC 21.4* 33.7*  CREATININE 1.64* 2.32*  LATICACIDVEN 0.9 1.6    Estimated Creatinine Clearance: 28.9 mL/min (A) (by C-G formula based on SCr of 2.32 mg/dL (H)).    No Known Allergies  Antimicrobials this admission: Vancomycin 10/09 >>  Cefepime 10/09 >>  Flagyl 10/09 X 1  Dose adjustments this admission: 10/10: cefepime 2g 12h changed to cefepime 2g q24h; discontinued scheduled vancomycin regimen based on inc in Scr   Microbiology results: 10/09 BCx: Sent 10/09 COVID: Sent  Thank you for allowing pharmacy to be a part of this patient's care.  Cristela Felt, PharmD PGY1 Pharmacy Resident [employer]: [phone]  03/28/2019 8:11 AM

## 2019-03-28 NOTE — Progress Notes (Signed)
PROGRESS NOTE  John Barton  DOB: 1946-04-07  PCP: Tammi Sou, MD IOX:[id number]  DOA: 03/23/2019  LOS: 1 day   Brief narrative: Patient is a 73 y.o. 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 Jimmy Footman.   Patient presented to the ED on 10/9 with sudden onset of fever, chills and tremors associated with right-sided chest pain at home.  In the ED, patient had a temperature 100.2, blood pressure was initially normal and later trended down as low as 86/57.  Required low-flow oxygen supply to maintain O2 sat above 90%. Work-up in the ED showed n WBC count elevated to 21.4, hemoglobin 12.8, platelet 163, sodium 134, BUN/creatinine 35/1.64, INR therapeutic at 2.4, lactic acid level normal COVID-19 test negative. Urinalysis showed cloudy urine with WBC >50 and rare bacteria. Chest x-ray showed streaky airspace opacities in the left lung base possibly consolidation. CT abdomen and pelvis showed a 5.1 cm infrarenal abdominal aortic aneurysm.  Also left lower quadrant renal transplant with mild perinephric stranding no hydronephrosis. 1.8 cm bladder calculus.  Patient initiated on IV antibiotics and is was admitted for further evaluation and management.    Subjective: Patient was seen and examined this morning.  Pleasant elderly male.  Resting in chair.  On low-flow oxygen via nasal cannula. Feels fatigued. Blood pressure trend noted, patient has remained persistently hypotensive mostly in the 80s since midnight. Labs from this morning showed creatinine worsening from 1.64 to 2.32.  WBC count worsening from 21.4 to 33.7.  Assessment/Plan: Sepsis with hypotension  -Met sepsis criteria on admission with leukocytosis, fever and potential source.    -Blood cultures and urine culture sent.  Initiated on broad-spectrum IV antibiotics and fluid resuscitation.   -Potential sources UTI versus left lower lobe pneumonia -Currently on IV cefepime and IV vancomycin. -Patient has remained persistently hypotensive since midnight.  Currently on normal saline at 125 mils per hour.  I will give a bolus of 1 L.   -Continue to monitor hemodynamically.  Lactic acid level normal on admission.   -Because of presence of mechanical valve, I would also watch out for bacteremia and endocarditis.  Obtain TTE.  Acute kidney injury on a transplant kidney -Renal transplant in 1998.  Follows up with Dr. Jimmy Footman.  -Baseline creatinine 1.3-1.4.  Patient presented with creatinine elevated to 1.64, further elevated to 2.32 today. -AKI likely secondary to overnight hypotension.  Given a liter bolus and continued on maintenance fluid.  Monitor blood pressure and renal function.  Nephrology consult called as well. -Continue immunosuppressive therapy.  Type 2 diabetes mellitus -A1c 6.6 in 2018 -I do not see any insulin or diabetes medication in his home list. -Repeat A1c.  Monitor with Accu-Cheks and sliding scale.  Mechanical aortic valve -On Coumadin, INR 2.4 on presentation.  Pharmacy to dose Coumadin to maintain INR between 2.5-3.  CAD s/p CABG 1995 Elevated troponin -Troponin was slightly elevated at 21 on admission. Currently not complaining of chest pain.  Continue to monitor. -Home meds include aspirin, statin.  Continue the same.  Combined systolic and diastolic CHF  History of hypertension -Echo in May 2019 showed EF 40 to 45% as well as diastolic dysfunction. -Repeat echo, look for vegetation. -Home meds include Lasix 40 mg daily, lisinopril 5 mg  daily.  Keep both on hold because of hypotension and AKI  Sinus bradycardia  -s/p ICD in place   BPH  -continue Flomax.  Body mass index is 26.88 kg/m. Mobility: PT evaluation once sepsis resolves.   Patient lives independently at home DVT prophylaxis:  Coumadin Code Status:   Code Status: Full Code  Family Communication:  Expected Discharge:  Continue inpatient management for now.  Transfer patient to higher level of care at a stepdown unit with cardiac monitoring.  Consultants:  Nephrology  Procedures:  None  Antimicrobials: Anti-infectives (From admission, onward)   Start     Dose/Rate Route Frequency Ordered Stop   03/28/19 2200  vancomycin (VANCOCIN) 1,250 mg in sodium chloride 0.9 % 250 mL IVPB  Status:  Discontinued     1,250 mg 166.7 mL/hr over 90 Minutes Intravenous Every 24 hours 03/23/2019 2054 03/28/19 0824   03/28/19 2000  ceFEPIme (MAXIPIME) 2 g in sodium chloride 0.9 % 100 mL IVPB     2 g 200 mL/hr over 30 Minutes Intravenous Every 24 hours 03/28/19 0817     03/28/19 0824  vancomycin variable dose per unstable renal function (pharmacist dosing)      Does not apply See admin instructions 03/28/19 0825     03/28/19 0800  ceFEPIme (MAXIPIME) 2 g in sodium chloride 0.9 % 100 mL IVPB  Status:  Discontinued     2 g 200 mL/hr over 30 Minutes Intravenous Every 12 hours 03/19/2019 2054 03/28/19 0817   03/26/2019 1930  ceFEPIme (MAXIPIME) 2 g in sodium chloride 0.9 % 100 mL IVPB     2 g 200 mL/hr over 30 Minutes Intravenous  Once 04/15/2019 1921 04/06/2019 2055   04/18/2019 1930  metroNIDAZOLE (FLAGYL) IVPB 500 mg     500 mg 100 mL/hr over 60 Minutes Intravenous  Once 04/10/2019 1921 04/04/2019 2159   04/11/2019 1930  vancomycin (VANCOCIN) IVPB 1000 mg/200 mL premix  Status:  Discontinued     1,000 mg 200 mL/hr over 60 Minutes Intravenous  Once 03/23/2019 1921 04/07/2019 1925   04/05/2019 1930  vancomycin (VANCOCIN) 1,750 mg in sodium chloride 0.9 % 500 mL IVPB     1,750 mg 250 mL/hr over 120 Minutes Intravenous  Once 03/31/2019 1925 03/31/2019 2303      Diet Order            Diet Heart Room service appropriate? Yes; Fluid consistency: Thin  Diet effective now              Infusions:   . sodium chloride 125 mL/hr at 03/28/19 0829  . ceFEPime (MAXIPIME) IV      Scheduled Meds: . aspirin EC  81 mg Oral Daily  . atorvastatin  80 mg Oral Daily  . calcitRIOL  0.5 mcg Oral Once per day on Tue Thu Sat   And  . [START ON 03/29/2019] calcitRIOL  1 mcg Oral Once per day on Sun Mon Wed Fri  . insulin aspart  0-5 Units Subcutaneous QHS  . insulin aspart  0-9 Units Subcutaneous TID WC  . mycophenolate  1,000 mg Oral BID  . tamsulosin  0.4 mg Oral Daily  . vancomycin variable dose per unstable renal function (pharmacist dosing)   Does not apply See admin instructions  . warfarin  7 mg Oral ONCE-1800  . Warfarin - Pharmacist Dosing Inpatient   Does not apply q1800    PRN meds: acetaminophen **OR** acetaminophen, ondansetron **OR** ondansetron (ZOFRAN) IV   Objective: Vitals:   03/28/19  1235 03/28/19 1309  BP: (!) 107/58 (!) 104/58  Pulse: 82 88  Resp:  19  Temp:  99.8 F (37.7 C)  SpO2:  94%    Intake/Output Summary (Last 24 hours) at 03/28/2019 1330 Last data filed at 03/28/2019 1154 Gross per 24 hour  Intake 2752.22 ml  Output -  Net 2752.22 ml   Filed Weights   04/18/2019 1811  Weight: 86.2 kg   Weight change:  Body mass index is 26.88 kg/m.   Physical Exam: General exam: Appears fatigued.  Not in physical distress Skin: No rashes, lesions or ulcers. HEENT: Atraumatic, normocephalic, supple neck, no obvious bleeding Lungs: Diminished air entry in both bases, no crackles CVS: Regular rate and rhythm, mechanical sound on aortic area GI/Abd soft, nondistended, nontender, bowel sound present CNS: Alert, awake, oriented x3, slow to respond Psychiatry: Mood appropriate Extremities: No pedal edema, no calf tenderness  Data Review: I have personally reviewed the laboratory data and studies available.  Recent Labs  Lab 03/25/2019 1822 03/28/19 0220  WBC 21.4* 33.7*  NEUTROABS 20.1*  --   HGB 12.8* 12.2*  HCT 38.8* 36.9*  MCV 87.8 87.9  PLT 163 170    Recent Labs  Lab 04/05/2019 1822 03/28/19 0220  NA 134* 136  K 4.1 4.3  CL 99 100  CO2 22 22  GLUCOSE 136* 157*  BUN 35* 41*  CREATININE 1.64* 2.32*  CALCIUM 9.1 8.8*    Terrilee Croak, MD  [facility] 03/28/2019

## 2019-03-29 ENCOUNTER — Other Ambulatory Visit: Payer: Self-pay

## 2019-03-29 ENCOUNTER — Encounter (HOSPITAL_COMMUNITY): Payer: Self-pay

## 2019-03-29 DIAGNOSIS — N179 Acute kidney failure, unspecified: Secondary | ICD-10-CM

## 2019-03-29 DIAGNOSIS — I9589 Other hypotension: Secondary | ICD-10-CM

## 2019-03-29 DIAGNOSIS — E861 Hypovolemia: Secondary | ICD-10-CM

## 2019-03-29 DIAGNOSIS — R197 Diarrhea, unspecified: Secondary | ICD-10-CM

## 2019-03-29 LAB — GLUCOSE, CAPILLARY
Glucose-Capillary: 143 mg/dL — ABNORMAL HIGH (ref 70–99)
Glucose-Capillary: 162 mg/dL — ABNORMAL HIGH (ref 70–99)
Glucose-Capillary: 133 mg/dL — ABNORMAL HIGH (ref 70–99)

## 2019-03-29 LAB — BASIC METABOLIC PANEL
Anion gap: 15 (ref 5–15)
Anion gap: 14 (ref 5–15)
BUN: 62 mg/dL — ABNORMAL HIGH (ref 8–23)
BUN: 66 mg/dL — ABNORMAL HIGH (ref 8–23)
CO2: 14 mmol/L — ABNORMAL LOW (ref 22–32)
CO2: 14 mmol/L — ABNORMAL LOW (ref 22–32)
Calcium: 7 mg/dL — ABNORMAL LOW (ref 8.9–10.3)
Calcium: 7.1 mg/dL — ABNORMAL LOW (ref 8.9–10.3)
Chloride: 106 mmol/L (ref 98–111)
Chloride: 106 mmol/L (ref 98–111)
Creatinine, Ser: 4.31 mg/dL — ABNORMAL HIGH (ref 0.61–1.24)
Creatinine, Ser: 4.88 mg/dL — ABNORMAL HIGH (ref 0.61–1.24)
GFR calc Af Amer: 15 mL/min — ABNORMAL LOW (ref >60)
GFR calc Af Amer: 12 mL/min — ABNORMAL LOW (ref >60)
GFR calc non Af Amer: 13 mL/min — ABNORMAL LOW (ref >60)
GFR calc non Af Amer: 11 mL/min — ABNORMAL LOW (ref >60)
Glucose, Bld: 139 mg/dL — ABNORMAL HIGH (ref 70–99)
Glucose, Bld: 151 mg/dL — ABNORMAL HIGH (ref 70–99)
Potassium: 4.3 mmol/L (ref 3.5–5.1)
Potassium: 4.4 mmol/L (ref 3.5–5.1)
Sodium: 135 mmol/L (ref 135–145)
Sodium: 134 mmol/L — ABNORMAL LOW (ref 135–145)

## 2019-03-29 LAB — CBC
HCT: 39.3 % (ref 39.0–52.0)
Hemoglobin: 11.7 g/dL — ABNORMAL LOW (ref 13.0–17.0)
MCH: 28.5 pg (ref 26.0–34.0)
MCHC: 29.8 g/dL — ABNORMAL LOW (ref 30.0–36.0)
MCV: 95.6 fL (ref 80.0–100.0)
Platelets: 127 10*3/uL — ABNORMAL LOW (ref 150–400)
RBC: 4.11 MIL/uL — ABNORMAL LOW (ref 4.22–5.81)
RDW: 14.5 % (ref 11.5–15.5)
WBC: 22.1 10*3/uL — ABNORMAL HIGH (ref 4.0–10.5)
nRBC: 0 % (ref 0.0–0.2)

## 2019-03-29 LAB — CBC WITH DIFFERENTIAL/PLATELET
Abs Immature Granulocytes: 0.36 10*3/uL — ABNORMAL HIGH (ref 0.00–0.07)
Basophils Absolute: 0 10*3/uL (ref 0.0–0.1)
Basophils Relative: 0 %
Eosinophils Absolute: 0 10*3/uL (ref 0.0–0.5)
Eosinophils Relative: 0 %
HCT: 33.8 % — ABNORMAL LOW (ref 39.0–52.0)
Hemoglobin: 10.2 g/dL — ABNORMAL LOW (ref 13.0–17.0)
Immature Granulocytes: 2 %
Lymphocytes Relative: 2 %
Lymphs Abs: 0.5 10*3/uL — ABNORMAL LOW (ref 0.7–4.0)
MCH: 28.3 pg (ref 26.0–34.0)
MCHC: 30.2 g/dL (ref 30.0–36.0)
MCV: 93.6 fL (ref 80.0–100.0)
Monocytes Absolute: 0.8 10*3/uL (ref 0.1–1.0)
Monocytes Relative: 3 %
Neutro Abs: 22.3 10*3/uL — ABNORMAL HIGH (ref 1.7–7.7)
Neutrophils Relative %: 93 %
Platelets: 126 10*3/uL — ABNORMAL LOW (ref 150–400)
RBC: 3.61 MIL/uL — ABNORMAL LOW (ref 4.22–5.81)
RDW: 14.4 % (ref 11.5–15.5)
WBC: 24 10*3/uL — ABNORMAL HIGH (ref 4.0–10.5)
nRBC: 0 % (ref 0.0–0.2)

## 2019-03-29 LAB — HEMOGLOBIN A1C
Hgb A1c MFr Bld: 6.9 % — ABNORMAL HIGH (ref 4.8–5.6)
Mean Plasma Glucose: 151.33 mg/dL

## 2019-03-29 LAB — MAGNESIUM: Magnesium: 1.4 mg/dL — ABNORMAL LOW (ref 1.7–2.4)

## 2019-03-29 LAB — VANCOMYCIN, RANDOM: Vancomycin Rm: 13

## 2019-03-29 LAB — C DIFFICILE QUICK SCREEN W PCR REFLEX
C Diff antigen: NEGATIVE
C Diff interpretation: NOT DETECTED
C Diff toxin: NEGATIVE

## 2019-03-29 LAB — PROTIME-INR
INR: 2.3 — ABNORMAL HIGH (ref 0.8–1.2)
Prothrombin Time: 25.3 seconds — ABNORMAL HIGH (ref 11.4–15.2)

## 2019-03-29 LAB — URINE CULTURE: Culture: <10000 — AB

## 2019-03-29 LAB — LACTIC ACID, PLASMA: Lactic Acid, Venous: 0.9 mmol/L (ref 0.5–1.9)

## 2019-03-29 LAB — CORTISOL: Cortisol, Plasma: 44.2 ug/dL

## 2019-03-29 MED ORDER — MAGNESIUM SULFATE 4 GM/100ML IV SOLN
4.0000 g | Freq: Once | INTRAVENOUS | Status: AC
  Administered 2019-03-29: 4 g via INTRAVENOUS
  Filled 2019-03-29: qty 100

## 2019-03-29 MED ORDER — SODIUM CHLORIDE 0.9 % IV BOLUS
1000.0000 mL | Freq: Once | INTRAVENOUS | Status: AC
  Administered 2019-03-29 (×2): 1000 mL via INTRAVENOUS

## 2019-03-29 MED ORDER — ORAL CARE MOUTH RINSE
15.0000 mL | Freq: Two times a day (BID) | OROMUCOSAL | Status: DC
  Administered 2019-03-29 – 2019-04-11 (×19): 15 mL via OROMUCOSAL

## 2019-03-29 MED ORDER — CHLORHEXIDINE GLUCONATE CLOTH 2 % EX PADS
6.0000 | MEDICATED_PAD | Freq: Every day | CUTANEOUS | Status: DC
  Administered 2019-03-29 – 2019-03-30 (×2): 6 via TOPICAL

## 2019-03-29 MED ORDER — SODIUM CHLORIDE 0.9 % IV BOLUS
500.0000 mL | Freq: Once | INTRAVENOUS | Status: AC
  Administered 2019-03-29: 500 mL via INTRAVENOUS

## 2019-03-29 MED ORDER — WARFARIN SODIUM 5 MG PO TABS
7.0000 mg | ORAL_TABLET | Freq: Once | ORAL | Status: AC
  Administered 2019-03-29: 18:00:00 7 mg via ORAL
  Filled 2019-03-29: qty 1

## 2019-03-29 NOTE — Progress Notes (Addendum)
PROGRESS NOTE  John Barton  DOB: 09-07-1945  PCP: Tammi Sou, MD AJO:[id number]  DOA: 04/15/2019  LOS: 2 days   Brief narrative: Patient is a 73 y.o. 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 Jimmy Footman.   Patient presented to the ED on 10/9 with sudden onset of fever, chills and tremors associated with right-sided chest pain at home.  In the ED, patient had a temperature 100.2, blood pressure was initially normal and later trended down as low as 86/57.  Required low-flow oxygen supply to maintain O2 sat above 90%. Work-up in the ED showed n WBC count elevated to 21.4, hemoglobin 12.8, platelet 163, sodium 134, BUN/creatinine 35/1.64, INR therapeutic at 2.4, lactic acid level normal COVID-19 test negative. Urinalysis showed cloudy urine with WBC >50 and rare bacteria. Chest x-ray showed streaky airspace opacities in the left lung base possibly consolidation. CT abdomen and pelvis showed a 5.1 cm infrarenal abdominal aortic aneurysm.  Also left lower quadrant renal transplant with mild perinephric stranding no hydronephrosis. 1.8 cm bladder calculus.  Patient initiated on IV antibiotics and is was admitted for further evaluation and management.    Subjective: Patient was seen and examined this afternoon.  Lying down in bed.  Not in distress.  Patient had few episodes of greenish fluids bowel movement last night which he thinks is because of brown-colored CellCept that he has been getting in the hospital.  He has been switched to his home feels.  Had a soft bowel movement later this morning. Remains on low-flow oxygen by nasal cannula.   Blood pressure remained mostly low in 80s overnight. Labs from this morning showed improving WBC count but  worsening creatinine, 4.3 today.   Critical care consultation was called.  Not transferred to ICU.  Assessment/Plan: Sepsis with hypotension  -Met sepsis criteria on admission with leukocytosis, fever and potential source.   -Blood cultures and urine culture sent.  Initiated on broad-spectrum IV antibiotics and fluid resuscitation.   -Potential sources UTI versus left lower lobe pneumonia -Currently on IV cefepime.  Start IV vancomycin because of worsening creatinine.  Pending blood culture and urine culture report. -Because of presence of mechanical valve, will look for the presence of bacteremia and endocarditis.  TTE showed a possible oscillating density in the mitral valve.  May need TEE once blood pressure stabilizes.  Persistent hypotension -Patient's blood pressure has been running low most of the time since admission as low as 80s. It's likely because of sepsis.   -He has received 5 L of normal saline so far and is currently on normal saline at 125 mils per hour.  -Blood pressure has been fluctuating.  -Critical care consult was obtained this morning.  No recommendation of ICU transfer or pressor agents. Continue to monitor blood pressure.-  Acute kidney injury on a transplant kidney Metabolic acidosis -Renal transplant in 1998.  Follows up with Dr. Jimmy Footman.  -Baseline creatinine 1.3-1.4.  Patient presented with creatinine elevated to 1.64, continues to worsen, 4.31 today.  Serum bicarb down to 14 today. -Nephrology consult appreciated.  Discussed with Dr. Clover Mealy today.  Serum bicarb will likely improve once blood pressure stabilizes with normal saline.  No recommendation to start on bicarbonate drip at this time. -Continue CellCept from home.  Diarrhea -Patient had few episodes of greenish fluids bowel movement last night which he thinks is because of brown-colored CellCept that he has been getting in the hospital.  He has been switched to his home pills.  Had a soft bowel  movement later this morning.  Continue to monitor for diarrhea.  Type 2 diabetes mellitus -A1c 6.6 in 2018 -Fingersticks normal.  We will stop doing fingersticks.  Mechanical aortic valve -On Coumadin, INR 2.4 on presentation.  Pharmacy to dose Coumadin to maintain INR between 2.5-3.  CAD s/p CABG 1995 Elevated troponin -Troponin was slightly elevated at 21 on admission. Currently not complaining of chest pain.  Continue to monitor. -Home meds include aspirin, statin.  Continue the same.  Combined systolic and diastolic CHF  History of hypertension -Echo 10/10, EF 45 to 50%, unchanged. -Home meds include Lasix 40 mg daily, lisinopril 5 mg daily.  Keep both on hold because of hypotension and AKI  Sinus bradycardia  -s/p ICD in place   BPH  -continue Flomax.  Body mass index is 26.88 kg/m. Mobility: PT evaluation once sepsis resolves.  Patient lives independently at home DVT prophylaxis:  Coumadin Code Status:   Code Status: Full Code  Family Communication:  Expected Discharge:  Continue inpatient management for now.  Transfer patient to higher level of care at a stepdown unit with cardiac monitoring.  Consultants:  Nephrology  Procedures:  None  Antimicrobials: Anti-infectives (From admission, onward)   Start     Dose/Rate Route Frequency Ordered Stop   03/28/19 2200  vancomycin (VANCOCIN) 1,250 mg in sodium chloride 0.9 % 250 mL IVPB  Status:  Discontinued     1,250 mg 166.7 mL/hr over 90 Minutes Intravenous Every 24 hours 04/01/2019 2054 03/28/19 0824   03/28/19 2000  ceFEPIme (MAXIPIME) 2 g in sodium chloride 0.9 % 100 mL IVPB     2 g 200 mL/hr over 30 Minutes Intravenous Every 24 hours 03/28/19 0817     03/28/19 0824  vancomycin variable dose per unstable renal function (pharmacist dosing)  Status:  Discontinued      Does not apply See admin instructions 03/28/19 0825 03/29/19 0958   03/28/19 0800  ceFEPIme (MAXIPIME) 2 g in sodium chloride 0.9 % 100 mL IVPB   Status:  Discontinued     2 g 200 mL/hr over 30 Minutes Intravenous Every 12 hours 03/30/2019 2054 03/28/19 0817   03/30/2019 1930  ceFEPIme (MAXIPIME) 2 g in sodium chloride 0.9 % 100 mL IVPB     2 g 200 mL/hr over 30 Minutes Intravenous  Once 03/30/2019 1921 03/31/2019 2055   04/07/2019 1930  metroNIDAZOLE (FLAGYL) IVPB 500 mg     500 mg 100 mL/hr over 60 Minutes Intravenous  Once 04/05/2019 1921 04/10/2019 2159   04/05/2019 1930  vancomycin (VANCOCIN) IVPB 1000 mg/200 mL premix  Status:  Discontinued     1,000 mg 200 mL/hr over 60 Minutes Intravenous  Once 03/20/2019 1921 03/22/2019 1925   04/06/2019 1930  vancomycin (VANCOCIN) 1,750 mg in sodium chloride 0.9 % 500 mL IVPB     1,750 mg 250 mL/hr over 120 Minutes Intravenous  Once 03/25/2019 1925 04/13/2019 2303      Diet Order            Diet Heart Room service appropriate? Yes; Fluid consistency: Thin  Diet effective now              Infusions:  . sodium chloride 125 mL/hr at 03/29/19 1234  . ceFEPime (MAXIPIME)  IV Stopped (03/29/19 3016)    Scheduled Meds: . aspirin EC  81 mg Oral Daily  . atorvastatin  80 mg Oral Daily  . calcitRIOL  0.5 mcg Oral Once per day on Tue Thu Sat   And  . calcitRIOL  1 mcg Oral Once per day on Sun Mon Wed Fri  . influenza vaccine adjuvanted  0.5 mL Intramuscular Tomorrow-1000  . insulin aspart  0-5 Units Subcutaneous QHS  . insulin aspart  0-9 Units Subcutaneous TID WC  . mycophenolate  1,000 mg Oral BID  . pneumococcal 23 valent vaccine  0.5 mL Intramuscular Tomorrow-1000  . tamsulosin  0.4 mg Oral Daily  . warfarin  7 mg Oral ONCE-1800  . Warfarin - Pharmacist Dosing Inpatient   Does not apply q1800    PRN meds: acetaminophen **OR** acetaminophen, ondansetron **OR** ondansetron (ZOFRAN) IV   Objective: Vitals:   03/29/19 1032 03/29/19 1339  BP: (!) 131/59 (!) 82/53  Pulse:  66  Resp:  18  Temp:  97.8 F (36.6 C)  SpO2:  97%    Intake/Output Summary (Last 24 hours) at 03/29/2019 1403 Last data  filed at 03/29/2019 1234 Gross per 24 hour  Intake 3948.12 ml  Output 200 ml  Net 3748.12 ml   Filed Weights   04/05/2019 1811  Weight: 86.2 kg   Weight change:  Body mass index is 26.88 kg/m.   Physical Exam: General exam: Appears fatigued.  Not in physical distress Skin: No rashes, lesions or ulcers. HEENT: Atraumatic, normocephalic, supple neck, no obvious bleeding Lungs: Diminished air entry in both bases, no crackles CVS: Regular rate and rhythm, mechanical sound on aortic area GI/Abd soft, nondistended, nontender, bowel sound present CNS: Alert, awake, oriented x3, slow to respond Psychiatry: Mood appropriate Extremities: No pedal edema, no calf tenderness  Data Review: I have personally reviewed the laboratory data and studies available.  Recent Labs  Lab 04/10/2019 1822 03/28/19 0220 03/29/19 0703  WBC 21.4* 33.7* 24.0*  NEUTROABS 20.1*  --  22.3*  HGB 12.8* 12.2* 10.2*  HCT 38.8* 36.9* 33.8*  MCV 87.8 87.9 93.6  PLT 163 170 126*   Recent Labs  Lab 04/01/2019 1822 03/28/19 0220 03/29/19 0703  NA 134* 136 135  K 4.1 4.3 4.3  CL 99 100 106  CO2 22 22 14*  GLUCOSE 136* 157* 139*  BUN 35* 41* 62*  CREATININE 1.64* 2.32* 4.31*  CALCIUM 9.1 8.8* 7.0*  MG  --   --  1.4*    Terrilee Croak, MD  [facility] 03/29/2019

## 2019-03-29 NOTE — Progress Notes (Signed)
Report called to [facility] in [facility], all questions entertained and answered.

## 2019-03-29 NOTE — Progress Notes (Signed)
ANTICOAGULATION CONSULT NOTE - Initial Consult  Pharmacy Consult for Coumadin Indication: atrial fibrillation/AVR  No Known Allergies  Patient Measurements: Height: 5' 10.5" (179.1 cm) Weight: 190 lb (86.2 kg) IBW/kg (Calculated) : 74.15  Vital Signs: Temp: 97.4 F (36.3 C) (10/11 0636) Temp Source: Oral (10/11 0636) BP: 82/56 (10/11 0636) Pulse Rate: 92 (10/11 0636)  Labs: Recent Labs    04/05/2019 1822 04/17/2019 1912 03/28/19 0220 03/28/19 0708 03/29/19 0703  HGB 12.8*  --  12.2*  --  10.2*  HCT 38.8*  --  36.9*  --  33.8*  PLT 163  --  170  --  126*  LABPROT  --  25.7*  --  23.2* 25.3*  INR  --  2.4*  --  2.1* 2.3*  CREATININE 1.64*  --  2.32*  --  4.31*    Estimated Creatinine Clearance: 15.5 mL/min (A) (by C-G formula based on SCr of 4.31 mg/dL (H)).   Medical History: Past Medical History:  Diagnosis Date  . Arthritis   . Bifascicular block    First degree AV block, right bundle branch block left anterior fascicular block.  Re-eval with event monitor 07/2017--cardiology  . Blood transfusion without reported diagnosis   . BPH with obstruction/lower urinary tract symptoms   . Carotid stenosis   . Chronic gout    due to renal impairment  . 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 Jimmy Footman.  GFR stable at 55 ml/min (Cr 1.28) 07/16/17.  Neph f/u 08/08/17, Cr up to 1.71, GFR 39 (in context of vol ovld & imp bladder emptying b/c out of flomax x 1 wk).  Cr  1.38 and GFR 50 ml/min as of 08/21/17 renal f/u.  Cr 1.27, GFR 56 on renal f/u 10/01/17. Cr 1.24, GFR 57 on 01/06/18. Cr 1.41, GFR 49 Jan 2020. Stab GFR 09/2018   . COPD (chronic obstructive pulmonary disease) ([place])   . Coronary artery disease    CABG 1995.  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 Diabetes mellitus without complication ([place]) 16/1096   New dx 10/15/16 by A1c criteria (6.6%)--recommended nutritionist referral/no meds at  initial dx.  . Glaucoma   . H/O paroxysmal supraventricular tachycardia    with wide complex. + Hx of a-fib as well.  Coumadin mgmt --nephrology  . History of renal transplant 1988  . Hyperlipidemia   . Hypertension   . ICD (implantable cardioverter-defibrillator) in place    Normal device check 01/28/16.  . Ischemic cardiomyopathy    Ischemic heart disease and mild depressed LV and RV function + DD and mild pulm HTN  . Post-transplant erythrocytosis   . 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  //  Echo 2/18: mild LVH, EF 45-50, diff HK, Gr 1 DD, severe MAC, mild Mitral Stenosis, normally functioning mechanical AVR (mean 12, peak 25).  Per cards, repeat echo planned for 2020.  Marland Kitchen Secondary hyperparathyroidism ([place])    parathyroidectomy done; subsequently has hypoparathyroidism, takes vit D  . Sinus bradycardia   . Syncope    He is s/p loop recorder insertion; as of Dr. Olin Pia f/u 01/16/16 he has had no further syncope and no significant arrhythmias.  He elected to keep the loop recorder in and was told to f/u 1 yr with Dr. Caryl Comes.   NORMAL DEVICE REMOTE TRANSMISSION--NO ATRIAL FIBRILLATION--as of 09/02/15.    Medications:  No current facility-administered medications  on file prior to encounter.    Current Outpatient Medications on File Prior to Encounter  Medication Sig Dispense Refill  . allopurinol (ZYLOPRIM) 100 MG tablet Take 100-200 mg by mouth 2 (two) times daily. 1 tablet by mouth in the morning 2 tablets by mouth at night    . aspirin EC 81 MG tablet Take 1 tablet (81 mg total) by mouth daily. 90 tablet 3  . atorvastatin (LIPITOR) 80 MG tablet Take 1 tablet (80 mg total) by mouth daily. 90 tablet 3  . calcitRIOL (ROCALTROL) 0.5 MCG capsule Take 0.5 mcg by mouth daily.     . furosemide (LASIX) 40 MG tablet Take 40 mg by mouth daily.    Marland Kitchen lisinopril (PRINIVIL,ZESTRIL) 5 MG tablet Take 5 mg by mouth daily.    .  Multiple Vitamins-Minerals (OCUVITE PO) Take 1 tablet by mouth 2 (two) times a day.     . mycophenolate (CELLCEPT) 500 MG tablet Take 1,000 mg by mouth 2 (two) times daily.     . Naproxen Sodium (ALEVE PO) Take 1 tablet by mouth daily as needed (pain).    . tamsulosin (FLOMAX) 0.4 MG CAPS capsule Take 0.4 mg by mouth daily.     Marland Kitchen warfarin (COUMADIN) 2 MG tablet Take 7 mg by mouth See admin instructions. Tuesday & Thursday patient takes a  total dose of 7 mg. Patient takes with 5 mg to equal total dose of 7 mg.    . warfarin (COUMADIN) 5 MG tablet Take 5 mg by mouth See admin instructions. Monday,Wednesday,Friday,Saturday & Sunday patient takes 5 mg.  On Tuesday and Thursday patient takes 7 mg.  10     Assessment: 73 y.o. male admitted 04/02/2019 for sepsis. Pharmacy has been consulted for warfarin for Afib and mechanical AVR. Patient is on warfarin PTA. INR on admission was subtherapeutic at 2.4. Patient was not administered warfarin on 10/9 (admission day) and reports he did not take it on 10/9 prior to coming.   INR subtherapeutic at 2.3 today after receiving 7 mg warfarin yesterday and no warfarin 10/9. CBC downtrending - Hgb 10.2 from 12.2, PLTs down 126 from 170. Will continue this higher dose again tonight and recheck INR tomorrow.  Warfarin PTA regimen: 13m on Tuesday & Thursday; 564mall other days  PTA regimen confirmed via patient with pharmacist and pharmacy technician separately. Patient's daughter was unsure of patient's regimen just that patient took a different dose twice weekly. No notes available via CareEverywhere to confirm.    Goal of Therapy:  INR 2.5-3.5 Monitor platelets by anticoagulation protocol: Yes   Plan:  Warfarin 16m3m1  Monitor INR, CBC, and S/S of bleeding daily   ChrRichardine ServiceharmD PGY1 Pharmacy Resident Phone: [phone]/04/2019  9:48 AM  Please check [email or web address] for unit-specific pharmacy phone numbers.

## 2019-03-29 NOTE — Progress Notes (Signed)
Medical team concerned about fluctuating blood pressure and not able to monitor in current unit.  Will transfer to ICU.  Chesley Mires, MD [facility] Pulmonary/Critical Care 03/29/2019, 4:56 PM

## 2019-03-29 NOTE — Progress Notes (Signed)
Subjective:  Low BPs - having diarrhea - no UOP - crt rising - positive 5 liters but does not seem overloaded.  Echo possibly showing density but not sure.  Blood cultures negative to date  Objective Vital signs in last 24 hours: Vitals:   03/28/19 2118 03/29/19 0019 03/29/19 0421 03/29/19 0636  BP: (!) 82/50 (!) 71/44 (!) 109/52 (!) 82/56  Pulse:  76 (!) 45 92  Resp:  16  18  Temp:  (!) 97.5 F (36.4 C) 98.2 F (36.8 C) (!) 97.4 F (36.3 C)  TempSrc:  Oral Oral Oral  SpO2:  97% 95% 92%  Weight:      Height:       Weight change:   Intake/Output Summary (Last 24 hours) at 03/29/2019 [id number] Last data filed at 03/29/2019 0800 Gross per 24 hour  Intake 3647.76 ml  Output -  Net 3647.76 ml     Assessment/Plan: 73 year old white male status post kidney transplant 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  Now presents with a sepsis type syndrome with evidence of UTI 1.Renal-status post renal transplant-sounds like living from his sister 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  He is on CellCept only for immunosuppression.  Baseline creatinine appears to be in the mid ones, patient thinks is about 1.3.  He now has suffered acute on chronic renal failure in the setting of sepsis type syndrome/hypotension on an ACE inhibitor and UTI. With appropriate management he does not seem to be improving- hypotensive and now evidence of continued end organ damage.  I will give another bolus but feel he will get into trouble-  Wonder if needs central line and pressor support.  Will follow closely with you urine output and labs. Will place foley.  No indications for dialysis as of yet but trend is not good  2. Hypertension/volume  -hypotension related to sepsis-on ACE inhibitor as outpatient.  Could exacerbate the ATN.  Lisinopril on hold and volume given for blood pressure support.  BP still low and evidence of end organ damage-  Still tanking up but may need pressors, have discussed with primary team 3.  ID-thought to have UTI with perinephric stranding and  also possibly a pulmonary process.  Per primary team has been placed on cefepime, Vanco, Flagyl 4. Anemia  -is not an issue yet but is dropping  5.  Chronic immunosuppression-continue CellCept 1000 twice daily-he is not on any other agents so am hesitant to stop it but definitely making his course more complicated  6. Diarrhea-  Severe-  Due to abx or possibly infectious diarrhea- supportive care     Louis Meckel    Labs: Basic Metabolic Panel: Recent Labs  Lab 04/02/2019 1822 03/28/19 0220 03/29/19 0703  NA 134* 136 135  K 4.1 4.3 4.3  CL 99 100 106  CO2 22 22 14*  GLUCOSE 136* 157* 139*  BUN 35* 41* 62*  CREATININE 1.64* 2.32* 4.31*  CALCIUM 9.1 8.8* 7.0*   Liver Function Tests: Recent Labs  Lab 03/30/2019 1822 03/28/19 0220  AST 23 28  ALT 19 19  ALKPHOS 85 78  BILITOT 1.1 1.3*  PROT 7.1 6.5  ALBUMIN 4.3 3.9   No results for input(s): LIPASE, AMYLASE in the last 168 hours. No results for input(s): AMMONIA in the last 168 hours. CBC: Recent Labs  Lab 04/07/2019 1822 03/28/19 0220 03/29/19 0703  WBC 21.4* 33.7* 24.0*  NEUTROABS 20.1*  --  22.3*  HGB 12.8* 12.2* 10.2*  HCT 38.8* 36.9* 33.8*  MCV 87.8 87.9 93.6  PLT 163 170 126*   Cardiac Enzymes: No results for input(s): CKTOTAL, CKMB, CKMBINDEX, TROPONINI in the last 168 hours. CBG: Recent Labs  Lab 03/28/19 1640 03/28/19 2134 03/29/19 0649  GLUCAP 150* 125* 143*    Iron Studies:  Recent Labs    03/30/2019 1912  FERRITIN 293   Studies/Results: Ct Abdomen Pelvis W Contrast  Result Date: 03/26/2019 CLINICAL DATA:  Fever.  No abdominal complaints. EXAM: CT ABDOMEN AND PELVIS WITH CONTRAST TECHNIQUE: Multidetector CT imaging of the abdomen and pelvis was performed using the standard protocol following bolus administration of intravenous contrast. CONTRAST:  11mL OMNIPAQUE IOHEXOL 300 MG/ML  SOLN COMPARISON:  None. FINDINGS: Lower chest: No acute abnormality. Bibasilar atelectasis/scarring. Dense  coronary, aortic valve, and mitral valve calcifications. Hepatobiliary: No focal liver abnormality. Tiny gallstones. No gallbladder wall thickening or biliary dilatation. Pancreas: Unremarkable. No pancreatic ductal dilatation or surrounding inflammatory changes. Spleen: Normal in size without focal abnormality. Adrenals/Urinary Tract: The adrenal glands are unremarkable. Absent left kidney. Severely atrophic right kidney. Left lower quadrant transplant kidney demonstrates mild perinephric stranding but is otherwise unremarkable. No hydronephrosis. 1.8 cm bladder calculus. Small left lateral bladder diverticulum. No bladder wall thickening. Stomach/Bowel: Stomach is within normal limits. Appendix appears normal. No evidence of bowel wall thickening, distention, or inflammatory changes. Vascular/Lymphatic: 5.1 cm infrarenal abdominal aortic aneurysm. Extensive aortoiliac and branch vessel atherosclerotic calcification. No enlarged abdominal or pelvic lymph nodes. Reproductive: Borderline prostatomegaly with coarse central calcifications. Other: Small fat containing umbilical hernia. No free fluid or pneumoperitoneum. Musculoskeletal: No acute or significant osseous findings. Chronic distal sacral fracture centrally at S4-L5. IMPRESSION: 1.  No acute intra-abdominal process. 2. 5.1 cm infrarenal abdominal aortic aneurysm. Recommend followup by abdomen and pelvis CTA in 3-6 months, and vascular surgery referral/consultation if not already obtained. This recommendation follows ACR consensus guidelines: White Paper of the ACR Incidental Findings Committee II on Vascular Findings. J Am Coll Radiol 2013; 10:789-794. Aortic aneurysm NOS (ICD10-I71.9) 3. Left lower quadrant renal transplant with mild perinephric stranding, nonspecific. No hydronephrosis. 4. 1.8 cm bladder calculus. 5. Cholelithiasis. 6.  Aortic atherosclerosis (ICD10-I70.0). Electronically Signed   By: Titus Dubin M.D.   On: 04/17/2019 22:28   Dg Chest  Portable 1 View  Result Date: 04/09/2019 CLINICAL DATA:  Fever. EXAM: PORTABLE CHEST 1 VIEW COMPARISON:  May 06, 2018 FINDINGS: Cardiomediastinal silhouette is normal. Mediastinal contours appear intact. Calcific atherosclerotic disease of the aorta. Post CABG postsurgical changes. Streaky airspace opacities in the left lung base. Osseous structures are without acute abnormality. Soft tissues are grossly normal. IMPRESSION: 1. Streaky airspace opacities in the left lung base may represent atelectasis or infectious airspace consolidation. 2. Calcific atherosclerotic disease of the aorta. Electronically Signed   By: Fidela Salisbury M.D.   On: 04/06/2019 18:49   Medications: Infusions: . sodium chloride 125 mL/hr at 03/29/19 0236  . ceFEPime (MAXIPIME) IV Stopped (03/29/19 [id number])    Scheduled Medications: . aspirin EC  81 mg Oral Daily  . atorvastatin  80 mg Oral Daily  . calcitRIOL  0.5 mcg Oral Once per day on Tue Thu Sat   And  . calcitRIOL  1 mcg Oral Once per day on Sun Mon Wed Fri  . influenza vaccine adjuvanted  0.5 mL Intramuscular Tomorrow-1000  . insulin aspart  0-5 Units Subcutaneous QHS  . insulin aspart  0-9 Units Subcutaneous TID WC  . mycophenolate  1,000 mg Oral BID  . pneumococcal 23 valent vaccine  0.5 mL Intramuscular Tomorrow-1000  . tamsulosin  0.4 mg Oral Daily  . vancomycin variable dose per unstable renal function (pharmacist dosing)   Does not apply See admin instructions  . Warfarin - Pharmacist Dosing Inpatient   Does not apply q1800    have reviewed scheduled and prn medications.  Physical Exam: General: somnolent but arousable-  Simple answers but does not seem confused Heart: RRR Lungs: mostly clear Abdomen: soft, non tender, no pain at kidney site Extremities: no edema appreciated     03/29/2019,9:24 AM  LOS: 2 days

## 2019-03-29 NOTE — Progress Notes (Signed)
Dr Horton Chin informed of bp readings and pt received 1000ML. Bolus, pt asx, he will contact ICU doc again to see pt

## 2019-03-29 NOTE — Consult Note (Signed)
NAME:  John Barton, MRN:  [id number], DOB:  Oct 19, 1945, LOS: 2 ADMISSION DATE:  04/12/2019, CONSULTATION DATE:  03/29/19 REFERRING MD:  Dr. Pietro Cassis, [facility], CHIEF COMPLAINT:  Low blood pressure   Brief History   73 yo male presented to ER with fever, chills, tremor and rt sided chest pain.  Noted to have low BP and worsening renal fx.  Also developed diarrhea.  PCCM asked to assess whether ICU transfer and pressors needed.  History of present illness   He has chronic cough with sputum.  Denies chest pain.  Reports brown cellcept pills cause him to get diarrhea.  This was changed and diarrhea better.  Breathing okay. Denies dizziness, or headache.    BP was low overnight.  Spoke with bedside RN.  His morning BP was better, but didn't transfer over to computer.  While I was in the room with RN the BP was 131/59 with HR 93 while he was sitting at side of bed.  He still has fluid bolus running.  Past Medical History  S/p AVR, Renal transplant, ischemic CM, HTN, HLD, PSVT, Glaucoma, DM, CAD, COPD, Gout, BPH  Significant Hospital Events   10/09 Admit  Consults:  Nephrolgoy  Procedures:    Significant Diagnostic Tests:  CT abd/pelvis 10/09 >> ATX, tiny gallstones, transplant kidney LLQ with mild perinephric stranding, 1.8 cm bladder calculus, 5.1 infrarenal AAA  Micro Data:  SARS2 10/9 >> negative Blood 10/09 >>  C diff 10/11 >> negative GI PCR 10/11 >>   Antimicrobials:  Cefepime 10/09 >>   Interim history/subjective:    Objective   Blood pressure (!) 131/59, pulse 93, temperature 98.2 F (36.8 C), temperature source Oral, resp. rate 16, height 5' 10.5" (1.791 m), weight 86.2 kg, SpO2 98 %.        Intake/Output Summary (Last 24 hours) at 03/29/2019 1045 Last data filed at 03/29/2019 1025 Gross per 24 hour  Intake 3946.81 ml  Output --  Net 3946.81 ml   Filed Weights   04/02/2019 1811  Weight: 86.2 kg    Examination:  General - alert Eyes - pupils reactive ENT -  no sinus tenderness, no stridor Cardiac - regular rate/rhythm, no murmur Chest - equal breath sounds b/l, no wheezing or rales Abdomen - soft, non tender, + bowel sounds Extremities - no cyanosis, clubbing, or edema Skin - no rashes Neuro - normal strength, moves extremities, follows commands Psych - normal mood and behavior   Resolved Hospital Problem list     Assessment & Plan:   Hypotension. - improved with IV fluids - don't think he needs CVL, pressors, or ICU transfer at this time  Diarrhea with hypovolemia. - pt feels related to different formulation of cellcept > improved after this was changed to home script - f/u GI PCR panel  AKI from hypovolemia. Hx of renal transplant. Non gap metabolic acidosis. - per renal and primary team  ?pna. - no definite infiltrate on CXR or CT imaging >> changes more likely represent atelectasis - ABx per primary team  Hx of CAD, AVR, chronic combined CHF. - per primary team  Best practice:  Diet: heart healthy DVT prophylaxis: coumadin GI prophylaxis: not indicated Mobility: oob to chair Code Status: full code Disposition: okay to keep in current unit.  Please call if additional help needed from PCCM while he is in hospital.  Labs   CBC: Recent Labs  Lab 03/28/2019 1822 03/28/19 0220 03/29/19 0703  WBC 21.4* 33.7* 24.0*  NEUTROABS 20.1*  --  22.3*  HGB 12.8* 12.2* 10.2*  HCT 38.8* 36.9* 33.8*  MCV 87.8 87.9 93.6  PLT 163 170 126*    Basic Metabolic Panel: Recent Labs  Lab 03/29/2019 1822 03/28/19 0220 03/29/19 0703  NA 134* 136 135  K 4.1 4.3 4.3  CL 99 100 106  CO2 22 22 14*  GLUCOSE 136* 157* 139*  BUN 35* 41* 62*  CREATININE 1.64* 2.32* 4.31*  CALCIUM 9.1 8.8* 7.0*  MG  --   --  1.4*   GFR: Estimated Creatinine Clearance: 15.5 mL/min (A) (by C-G formula based on SCr of 4.31 mg/dL (H)). Recent Labs  Lab 04/10/2019 1822 03/28/19 0220 03/29/19 0703  PROCALCITON 0.18  --   --   WBC 21.4* 33.7* 24.0*    LATICACIDVEN 0.9 1.6  --     Liver Function Tests: Recent Labs  Lab 04/13/2019 1822 03/28/19 0220  AST 23 28  ALT 19 19  ALKPHOS 85 78  BILITOT 1.1 1.3*  PROT 7.1 6.5  ALBUMIN 4.3 3.9   No results for input(s): LIPASE, AMYLASE in the last 168 hours. No results for input(s): AMMONIA in the last 168 hours.  ABG No results found for: PHART, PCO2ART, PO2ART, HCO3, TCO2, ACIDBASEDEF, O2SAT   Coagulation Profile: Recent Labs  Lab 04/04/2019 1912 03/28/19 0708 03/29/19 0703  INR 2.4* 2.1* 2.3*    Cardiac Enzymes: No results for input(s): CKTOTAL, CKMB, CKMBINDEX, TROPONINI in the last 168 hours.  HbA1C: Hemoglobin A1C  Date/Time Value Ref Range Status  04/22/2017 02:11 PM 6.1  Final   Hgb A1c MFr Bld  Date/Time Value Ref Range Status  03/29/2019 07:03 AM 6.9 (H) 4.8 - 5.6 % Final    Comment:    (NOTE) Pre diabetes:          5.7%-6.4% Diabetes:              >6.4% Glycemic control for   <7.0% adults with diabetes   10/16/2016 10:42 AM 6.6 (H) 4.6 - 6.5 % Final    Comment:    Glycemic Control Guidelines for People with Diabetes:Non Diabetic:  <6%Goal of Therapy: <7%Additional Action Suggested:  >8%     CBG: Recent Labs  Lab 03/28/19 1640 03/28/19 2134 03/29/19 0649  GLUCAP 150* 125* 143*    Review of Systems:   Reviewed and negative  Past Medical History  He,  has a past medical history of Arthritis, Bifascicular block, Blood transfusion without reported diagnosis, BPH with obstruction/lower urinary tract symptoms, Carotid stenosis, Chronic gout, Chronic renal insufficiency, stage III (moderate), COPD (chronic obstructive pulmonary disease) ([place]), Coronary artery disease, Diabetes mellitus without complication ([place]) (01/8109), Glaucoma, H/O paroxysmal supraventricular tachycardia, History of renal transplant (1988), Hyperlipidemia, Hypertension, ICD (implantable cardioverter-defibrillator) in place, Ischemic cardiomyopathy, Post-transplant erythrocytosis, S/P  aortic valve replacement (1995), Secondary hyperparathyroidism ([place]), Sinus bradycardia, and Syncope.   Surgical History    Past Surgical History:  Procedure Laterality Date   AORTIC VALVE REPLACEMENT  1995   ST.Jude mechanical valve   CARDIAC CATHETERIZATION  1995   CARDIAC EVENT MONITOR  07/2017   PVCs, sinus brady---no significant abnormal findings.   CORONARY ARTERY BYPASS GRAFT  1995   RCA andLAD   ELECTROPHYSIOLOGY STUDY N/A 09/11/2012   Procedure: ELECTROPHYSIOLOGY STUDY;  Surgeon: Deboraha Sprang, MD;  Location[facility] CATH LAB;  Service: Cardiovascular;  Laterality: N/A;   KIDNEY TRANSPLANT  09/10/1996   (born w/1 kidney).  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  2018: EF 45-50%, normal functioning prosthetic AV, diffuse hypokinesis, grd I DD, mild mitral stenosis.  10/2017: EF 40-45%, global hypokinesis, grd II DD, mild MR, severe LAE, mild RV dysfxn, mild inc pulm press.     Social History   reports that he quit smoking about 4 years ago. His smoking use included cigars. He has never used smokeless tobacco. He reports current alcohol use of about 14.0 standard drinks of alcohol per week. He reports that he does not use drugs.   Family History   His family history includes Alcohol abuse in his brother; Arthritis in his mother; Diabetes in his daughter and father; Heart disease in his brother, father, and mother; Heart failure in his father and mother; Hyperlipidemia in his father and mother; Hypertension in his father and mother; Liver disease in his brother.   Allergies No Known Allergies   Home Medications  Prior to Admission medications   Medication Sig Start Date End Date Taking? Authorizing Provider  allopurinol (ZYLOPRIM) 100 MG tablet Take 100-200 mg by mouth 2 (two)  times daily. 1 tablet by mouth in the morning 2 tablets by mouth at night   Yes [provider]  aspirin EC 81 MG tablet Take 1 tablet (81 mg total) by mouth daily. 11/03/18  Yes Deboraha Sprang, MD  atorvastatin (LIPITOR) 80 MG tablet Take 1 tablet (80 mg total) by mouth daily. 04/17/16  Yes McGowen, [name], MD  calcitRIOL (ROCALTROL) 0.5 MCG capsule Take 0.5 mcg by mouth daily.    Yes [provider]  furosemide (LASIX) 40 MG tablet Take 40 mg by mouth daily.   Yes [provider]  lisinopril (PRINIVIL,ZESTRIL) 5 MG tablet Take 5 mg by mouth daily.   Yes [provider]  Multiple Vitamins-Minerals (OCUVITE PO) Take 1 tablet by mouth 2 (two) times a day.    Yes [provider]  mycophenolate (CELLCEPT) 500 MG tablet Take 1,000 mg by mouth 2 (two) times daily.    Yes [provider]  Naproxen Sodium (ALEVE PO) Take 1 tablet by mouth daily as needed (pain).   Yes [provider]  tamsulosin (FLOMAX) 0.4 MG CAPS capsule Take 0.4 mg by mouth daily.  03/21/16  Yes [provider]  warfarin (COUMADIN) 2 MG tablet Take 7 mg by mouth See admin instructions. Tuesday & Thursday patient takes a  total dose of 7 mg. Patient takes with 5 mg to equal total dose of 7 mg.    [provider]  warfarin (COUMADIN) 5 MG tablet Take 5 mg by mouth See admin instructions. Monday,Wednesday,Friday,Saturday & Sunday patient takes 5 mg.  On Tuesday and Thursday patient takes 7 mg. 11/02/17   [provider]    Chesley Mires, MD [place] 03/29/2019, 11:04 AM

## 2019-03-29 NOTE — Progress Notes (Signed)
Pt transferred to [place] in bed, pt stable, alert and oriented, sr on telemetry, 02 2l/[place] no questions from receiving nurse

## 2019-03-30 DIAGNOSIS — D849 Immunodeficiency, unspecified: Secondary | ICD-10-CM

## 2019-03-30 DIAGNOSIS — N390 Urinary tract infection, site not specified: Secondary | ICD-10-CM

## 2019-03-30 DIAGNOSIS — A419 Sepsis, unspecified organism: Principal | ICD-10-CM

## 2019-03-30 DIAGNOSIS — Z940 Kidney transplant status: Secondary | ICD-10-CM

## 2019-03-30 LAB — GI PATHOGEN PANEL BY PCR, STOOL

## 2019-03-30 LAB — CBC
HCT: 35.1 % — ABNORMAL LOW (ref 39.0–52.0)
Hemoglobin: 10.9 g/dL — ABNORMAL LOW (ref 13.0–17.0)
MCH: 28.7 pg (ref 26.0–34.0)
MCHC: 31.1 g/dL (ref 30.0–36.0)
MCV: 92.4 fL (ref 80.0–100.0)
Platelets: 121 10*3/uL — ABNORMAL LOW (ref 150–400)
RBC: 3.8 MIL/uL — ABNORMAL LOW (ref 4.22–5.81)
RDW: 14.5 % (ref 11.5–15.5)
WBC: 20.9 10*3/uL — ABNORMAL HIGH (ref 4.0–10.5)
nRBC: 0 % (ref 0.0–0.2)

## 2019-03-30 LAB — BASIC METABOLIC PANEL
Anion gap: 14 (ref 5–15)
BUN: 69 mg/dL — ABNORMAL HIGH (ref 8–23)
CO2: 15 mmol/L — ABNORMAL LOW (ref 22–32)
Calcium: 7 mg/dL — ABNORMAL LOW (ref 8.9–10.3)
Chloride: 105 mmol/L (ref 98–111)
Creatinine, Ser: 5.13 mg/dL — ABNORMAL HIGH (ref 0.61–1.24)
GFR calc Af Amer: 12 mL/min — ABNORMAL LOW (ref >60)
GFR calc non Af Amer: 10 mL/min — ABNORMAL LOW (ref >60)
Glucose, Bld: 161 mg/dL — ABNORMAL HIGH (ref 70–99)
Potassium: 4.2 mmol/L (ref 3.5–5.1)
Sodium: 134 mmol/L — ABNORMAL LOW (ref 135–145)

## 2019-03-30 LAB — MAGNESIUM: Magnesium: 2.2 mg/dL (ref 1.7–2.4)

## 2019-03-30 LAB — PROTIME-INR
INR: 2.8 — ABNORMAL HIGH (ref 0.8–1.2)
Prothrombin Time: 29 seconds — ABNORMAL HIGH (ref 11.4–15.2)

## 2019-03-30 LAB — PROCALCITONIN: Procalcitonin: 28.5 ng/mL

## 2019-03-30 MED ORDER — WARFARIN SODIUM 5 MG PO TABS
5.0000 mg | ORAL_TABLET | Freq: Once | ORAL | Status: AC
  Administered 2019-03-30: 5 mg via ORAL
  Filled 2019-03-30: qty 1

## 2019-03-30 MED ORDER — SODIUM BICARBONATE 650 MG PO TABS
1300.0000 mg | ORAL_TABLET | Freq: Two times a day (BID) | ORAL | Status: DC
  Administered 2019-03-30 – 2019-04-10 (×23): 1300 mg via ORAL
  Filled 2019-03-30 (×24): qty 2

## 2019-03-30 MED ORDER — CHLORHEXIDINE GLUCONATE CLOTH 2 % EX PADS
6.0000 | MEDICATED_PAD | Freq: Every day | CUTANEOUS | Status: DC
  Administered 2019-03-31 – 2019-04-13 (×10): 6 via TOPICAL

## 2019-03-30 MED ORDER — DIPHENHYDRAMINE HCL 25 MG PO CAPS
25.0000 mg | ORAL_CAPSULE | Freq: Every evening | ORAL | Status: DC | PRN
  Administered 2019-03-30 – 2019-04-10 (×13): 25 mg via ORAL
  Filled 2019-03-30 (×13): qty 1

## 2019-03-30 MED ORDER — SODIUM CHLORIDE 0.9 % IV SOLN
INTRAVENOUS | Status: DC | PRN
  Administered 2019-03-30 – 2019-03-31 (×2): 1000 mL via INTRAVENOUS
  Administered 2019-04-03 – 2019-04-10 (×5): 250 mL via INTRAVENOUS
  Administered 2019-04-11: 1000 mL via INTRAVENOUS

## 2019-03-30 MED ORDER — MYCOPHENOLATE SODIUM 180 MG PO TBEC
720.0000 mg | DELAYED_RELEASE_TABLET | Freq: Two times a day (BID) | ORAL | Status: DC
  Administered 2019-03-30 – 2019-04-04 (×11): 720 mg via ORAL
  Filled 2019-03-30 (×12): qty 4

## 2019-03-30 NOTE — Progress Notes (Signed)
[facility] Progress Note Patient Name: ANUAR WESOLOSKI DOB: 1946/03/09 MRN: [id number]   Date of Service  03/30/2019  HPI/Events of Note  Notified that patient is 5 liters positive and remains oliguric. Fluid ongoing at 125 cc/hr. Now off pressors and lactate normalized. Clear lungs on bedside exam.  eICU Interventions  Decrease IVF to 50 cc/hr for now and reassess need for ongoing fluids given oliguria     Intervention Category Intermediate Interventions: Hypervolemia - evaluation and management  Judd Lien 03/30/2019, 12:29 AM

## 2019-03-30 NOTE — Progress Notes (Signed)
ANTICOAGULATION CONSULT NOTE - Follow-Up Consult  Pharmacy Consult for Coumadin Indication: atrial fibrillation/AVR  No Known Allergies  Patient Measurements: Height: 5' 10.5" (179.1 cm) Weight: 205 lb 14.6 oz (93.4 kg) IBW/kg (Calculated) : 74.15  Vital Signs: Temp: 97.6 F (36.4 C) (10/12 0807) Temp Source: Oral (10/12 0807) BP: 118/68 (10/12 1000)  Labs: Recent Labs    03/28/19 0708 03/29/19 0703 03/29/19 1950 03/30/19 0216  HGB  --  10.2* 11.7* 10.9*  HCT  --  33.8* 39.3 35.1*  PLT  --  126* 127* 121*  LABPROT 23.2* 25.3*  --  29.0*  INR 2.1* 2.3*  --  2.8*  CREATININE  --  4.31* 4.88* 5.13*    Estimated Creatinine Clearance: 14.4 mL/min (A) (by C-G formula based on SCr of 5.13 mg/dL (H)).   Medical History: Past Medical History:  Diagnosis Date  . Arthritis   . Bifascicular block    First degree AV block, right bundle branch block left anterior fascicular block.  Re-eval with event monitor 07/2017--cardiology  . Blood transfusion without reported diagnosis   . BPH with obstruction/lower urinary tract symptoms   . Carotid stenosis   . Chronic gout    due to renal impairment  . 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 Jimmy Footman.  GFR stable at 55 ml/min (Cr 1.28) 07/16/17.  Neph f/u 08/08/17, Cr up to 1.71, GFR 39 (in context of vol ovld & imp bladder emptying b/c out of flomax x 1 wk).  Cr  1.38 and GFR 50 ml/min as of 08/21/17 renal f/u.  Cr 1.27, GFR 56 on renal f/u 10/01/17. Cr 1.24, GFR 57 on 01/06/18. Cr 1.41, GFR 49 Jan 2020. Stab GFR 09/2018   . COPD (chronic obstructive pulmonary disease) ([place])   . Coronary artery disease    CABG 1995.  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 Diabetes mellitus without complication ([place]) 71/2458   New dx 10/15/16 by A1c criteria (6.6%)--recommended nutritionist referral/no meds at initial dx.  . Glaucoma   . H/O paroxysmal supraventricular  tachycardia    with wide complex. + Hx of a-fib as well.  Coumadin mgmt --nephrology  . History of renal transplant 1988  . Hyperlipidemia   . Hypertension   . ICD (implantable cardioverter-defibrillator) in place    Normal device check 01/28/16.  . Ischemic cardiomyopathy    Ischemic heart disease and mild depressed LV and RV function + DD and mild pulm HTN  . Post-transplant erythrocytosis   . 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  //  Echo 2/18: mild LVH, EF 45-50, diff HK, Gr 1 DD, severe MAC, mild Mitral Stenosis, normally functioning mechanical AVR (mean 12, peak 25).  Per cards, repeat echo planned for 2020.  Marland Kitchen Secondary hyperparathyroidism ([place])    parathyroidectomy done; subsequently has hypoparathyroidism, takes vit D  . Sinus bradycardia   . Syncope    He is s/p loop recorder insertion; as of Dr. Olin Pia f/u 01/16/16 he has had no further syncope and no significant arrhythmias.  He elected to keep the loop recorder in and was told to f/u 1 yr with Dr. Caryl Comes.   NORMAL DEVICE REMOTE TRANSMISSION--NO ATRIAL FIBRILLATION--as of 09/02/15.    Medications:  No current facility-administered medications on file prior to encounter.    Current Outpatient Medications on File Prior to Encounter  Medication Sig Dispense Refill  .  allopurinol (ZYLOPRIM) 100 MG tablet Take 100-200 mg by mouth 2 (two) times daily. 1 tablet by mouth in the morning 2 tablets by mouth at night    . aspirin EC 81 MG tablet Take 1 tablet (81 mg total) by mouth daily. 90 tablet 3  . atorvastatin (LIPITOR) 80 MG tablet Take 1 tablet (80 mg total) by mouth daily. 90 tablet 3  . calcitRIOL (ROCALTROL) 0.5 MCG capsule Take 0.5 mcg by mouth daily.     . furosemide (LASIX) 40 MG tablet Take 40 mg by mouth daily.    Marland Kitchen lisinopril (PRINIVIL,ZESTRIL) 5 MG tablet Take 5 mg by mouth daily.    . Multiple Vitamins-Minerals (OCUVITE PO) Take 1 tablet by mouth 2  (two) times a day.     . mycophenolate (CELLCEPT) 500 MG tablet Take 1,000 mg by mouth 2 (two) times daily.     . Naproxen Sodium (ALEVE PO) Take 1 tablet by mouth daily as needed (pain).    . tamsulosin (FLOMAX) 0.4 MG CAPS capsule Take 0.4 mg by mouth daily.     Marland Kitchen warfarin (COUMADIN) 2 MG tablet Take 7 mg by mouth See admin instructions. Tuesday & Thursday patient takes a  total dose of 7 mg. Patient takes with 5 mg to equal total dose of 7 mg.    . warfarin (COUMADIN) 5 MG tablet Take 5 mg by mouth See admin instructions. Monday,Wednesday,Friday,Saturday & Sunday patient takes 5 mg.  On Tuesday and Thursday patient takes 7 mg.  10     Assessment: 73 y.o. male admitted 03/28/2019 for sepsis. Pharmacy has been consulted for warfarin for Afib and mechanical AVR. Patient is on warfarin PTA with goal INR 2.5-3.5. INR slightly below goal on admit, now therapeutic at 2.8. H/H relatively stable.  **Warfarin PTA regimen: 39m on Tuesday & Thursday; 548mall other days     Goal of Therapy:  INR 2.5-3.5 Monitor platelets by anticoagulation protocol: Yes   Plan:  Warfarin 39m139m1 tonight Daily protime   MicArrie SenateharmD, BCPS Clinical Pharmacist [phone] check AMION for all [facility] 03/30/2019

## 2019-03-30 NOTE — Progress Notes (Signed)
Patient ID: John Barton, male   DOB: [birth year], 73 y.o.   MRN: [id number] S:Main complaint is discomfort of bed.  BP's better this am. O:BP 115/67   Pulse 69   Temp 97.6 F (36.4 C) (Oral)   Resp (!) 27   Ht 5' 10.5" (1.791 m)   Wt 93.4 kg   SpO2 93%   BMI 29.13 kg/m   Intake/Output Summary (Last 24 hours) at 03/30/2019 0947 Last data filed at 03/30/2019 0700 Gross per 24 hour  Intake 3874.76 ml  Output 376 ml  Net 3498.76 ml   Intake/Output: I/O last 3 completed shifts: In: 5633.6 [P.O.:960; I.V.:4323.6; IV Piggyback:350] Out: 376 [Urine:375; Stool:1]  Intake/Output this shift:  No intake/output data recorded. Weight change:  Gen: NAD CVS: no rub Resp: cta Abd: Distended, +BS, soft, renal allograft in LLQ and mildly tender, no bruits Ext: no edema, clotted left forearm avg.  Recent Labs  Lab 04/13/2019 1822 03/28/19 0220 03/29/19 0703 03/29/19 1950 03/30/19 0216  NA 134* 136 135 134* 134*  K 4.1 4.3 4.3 4.4 4.2  CL 99 100 106 106 105  CO2 22 22 14* 14* 15*  GLUCOSE 136* 157* 139* 151* 161*  BUN 35* 41* 62* 66* 69*  CREATININE 1.64* 2.32* 4.31* 4.88* 5.13*  ALBUMIN 4.3 3.9  --   --   --   CALCIUM 9.1 8.8* 7.0* 7.1* 7.0*  AST 23 28  --   --   --   ALT 19 19  --   --   --    Liver Function Tests: Recent Labs  Lab 04/12/2019 1822 03/28/19 0220  AST 23 28  ALT 19 19  ALKPHOS 85 78  BILITOT 1.1 1.3*  PROT 7.1 6.5  ALBUMIN 4.3 3.9   No results for input(s): LIPASE, AMYLASE in the last 168 hours. No results for input(s): AMMONIA in the last 168 hours. CBC: Recent Labs  Lab 03/30/2019 1822 03/28/19 0220 03/29/19 0703 03/29/19 1950 03/30/19 0216  WBC 21.4* 33.7* 24.0* 22.1* 20.9*  NEUTROABS 20.1*  --  22.3*  --   --   HGB 12.8* 12.2* 10.2* 11.7* 10.9*  HCT 38.8* 36.9* 33.8* 39.3 35.1*  MCV 87.8 87.9 93.6 95.6 92.4  PLT 163 170 126* 127* 121*   Cardiac Enzymes: No results for input(s): CKTOTAL, CKMB, CKMBINDEX, TROPONINI in the last 168  hours. CBG: Recent Labs  Lab 03/28/19 1640 03/28/19 2134 03/29/19 0649 03/29/19 1206 03/29/19 1854  GLUCAP 150* 125* 143* 162* 133*    Iron Studies:  Recent Labs    04/13/2019 1912  FERRITIN 293   Studies/Results: No results found. Marland Kitchen aspirin EC  81 mg Oral Daily  . atorvastatin  80 mg Oral Daily  . calcitRIOL  0.5 mcg Oral Once per day on Tue Thu Sat   And  . calcitRIOL  1 mcg Oral Once per day on Sun Mon Wed Fri  . Chlorhexidine Gluconate Cloth  6 each Topical Daily  . influenza vaccine adjuvanted  0.5 mL Intramuscular Tomorrow-1000  . mouth rinse  15 mL Mouth Rinse BID  . mycophenolate  1,000 mg Oral BID  . pneumococcal 23 valent vaccine  0.5 mL Intramuscular Tomorrow-1000  . tamsulosin  0.4 mg Oral Daily  . Warfarin - Pharmacist Dosing Inpatient   Does not apply q1800    BMET    Component Value Date/Time   NA 134 (L) 03/30/2019 0216   NA 136 (A) 12/31/2018   K 4.2 03/30/2019 0216  K 5.3 11/17/2013   CL 105 03/30/2019 0216   CL 100 11/17/2013   CO2 15 (L) 03/30/2019 0216   GLUCOSE 161 (H) 03/30/2019 0216   BUN 69 (H) 03/30/2019 0216   BUN 22 (A) 12/31/2018   CREATININE 5.13 (H) 03/30/2019 0216   CREATININE 1.34 11/17/2013   CALCIUM 7.0 (L) 03/30/2019 0216   CALCIUM 10.0 11/17/2013   GFRNONAA 10 (L) 03/30/2019 0216   GFRNONAA 54 11/17/2013   GFRAA 12 (L) 03/30/2019 0216   GFRAA 62 11/17/2013   CBC    Component Value Date/Time   WBC 20.9 (H) 03/30/2019 0216   RBC 3.80 (L) 03/30/2019 0216   HGB 10.9 (L) 03/30/2019 0216   HGB 12.9 (L) 08/15/2017 0949   HCT 35.1 (L) 03/30/2019 0216   HCT 37.9 08/15/2017 0949   HCT 44 11/17/2013   PLT 121 (L) 03/30/2019 0216   PLT 206 08/15/2017 0949   MCV 92.4 03/30/2019 0216   MCV 86 08/15/2017 0949   MCH 28.7 03/30/2019 0216   MCHC 31.1 03/30/2019 0216   RDW 14.5 03/30/2019 0216   RDW 14.7 08/15/2017 0949   RDW 14.2 11/17/2013   LYMPHSABS 0.5 (L) 03/29/2019 0703   MONOABS 0.8 03/29/2019 0703   EOSABS 0.0  03/29/2019 0703   EOSABS 0 11/17/2013   BASOSABS 0.0 03/29/2019 0703   BASOSABS 1 11/17/2013     Assessment/Plan:  1. AKI/CKD stage 3 presumably due to ischemic ATN in setting of volume depletion and hypotension.  Remains oliguric despite IVF's, but UOP has picked up to 375 today.  No indication for dialysis but will need to continue to monitor closely 2. Hypotension- presumably due to volume depletion with diarrhea but need to r/o sepsis.  Did have stranding on renal US and is tender.  Cultures pending 3. Diarrhea- possibly due to cellcept but with leukocytosis and hypotension, need to r/o infectious etiology.  Will change to myfortic. 4. Metabolic acidosis- bicarb and follow 5. 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   Donetta Potts, MD [employer] [phone]

## 2019-03-30 NOTE — Progress Notes (Addendum)
NAME:  John Barton, MRN:  [id number], DOB:  April 21, 1946, LOS: 3 ADMISSION DATE:  03/24/2019, CONSULTATION DATE:  03/29/19 REFERRING MD:  Dr. Pietro Cassis, [facility], CHIEF COMPLAINT:  Low blood pressure   Brief History   73 yo male presented to ER with fever, chills, tremor and rt sided chest pain.  Noted to have low BP and worsening renal fx.  Also developed diarrhea.  PCCM asked to assess whether ICU transfer and pressors needed.  Subsequently transferred to ICU for labile BP.   History of present illness    73 yo male presented to ER 10/9 with fever, chills, tremor and rt sided chest pain.  Noted to have low BP and worsening renal fx.  Also developed diarrhea.  On 10/11 PCCM asked to assess whether ICU transfer and pressors needed.  Subsequently transferred to ICU for labile BP.  He has chronic cough with sputum.  Denies chest pain.  Reports brown cellcept pills cause him to get diarrhea.  This was changed and diarrhea better.  Breathing okay. Denies dizziness, or headache.    On 10/11 BP was low overnight.  Spoke with bedside RN.  His morning BP was better, but didn't transfer over to computer.  While PCCM in the room with RN the BP was 131/59 with HR 93 while he was sitting at side of bed.  He still had fluid bolus running.  Past Medical History  S/p AVR, Renal transplant, ischemic CM, HTN, HLD, PSVT, Glaucoma, DM, CAD, COPD, Gout, BPH  Significant Hospital Events   10/09 Admit  Consults:  Nephrolgoy  Procedures:    Significant Diagnostic Tests:  CT abd/pelvis 10/09 >> ATX, tiny gallstones, transplant kidney LLQ with mild perinephric stranding, 1.8 cm bladder calculus, 5.1 infrarenal AAA  Micro Data:  SARS2 10/9 >> negative Blood 10/09 >>NGTD  C diff 10/11 >> negative GI PCR 10/11 >>  Urine 10/9 <10K/ml-insignificant growth   Antimicrobials:  Cefepime 10/09 >>   Interim history/subjective:  BP stabilized. No hypotension. 400cc UO overnight. Pt has not complaints.  On 2lpm  O2. I decreased to 1lpm. Assisted pt to bedside chair for breakfast. He was able to ambulate and pivot 3 steps /s issues.   Objective   Blood pressure 115/67, pulse 69, temperature 97.6 F (36.4 C), temperature source Oral, resp. rate (!) 27, height 5' 10.5" (1.791 m), weight 93.4 kg, SpO2 93 %.        Intake/Output Summary (Last 24 hours) at 03/30/2019 0912 Last data filed at 03/30/2019 0700 Gross per 24 hour  Intake 3874.76 ml  Output 376 ml  Net 3498.76 ml   Filed Weights   04/12/2019 1811 03/30/19 0500  Weight: 86.2 kg 93.4 kg    Examination:  General: Well-developed, well nourished. NAD HENT: Normocephalic, PERRL. Moist mucus membranes Neck: No JVD. Trachea midline. No thyromegaly, no lymphadenopathy CV: RRR. S1S2. No MRG. +2 distal pulses Lungs: BBS present, very mild end exp wheeze noted, FNL, symmetrical ABD: +BS x4. SNT/ND. No masses, guarding or rigidity GU: Foley EXT: MAE well. No edema Skin: PWD. In tact. No rashes or lesions Neuro: A&Ox3. CN II-XII in tact. No focal deficits Psych: Appropriate mood, insight and judgment for time and situation     Resolved Hospital Problem list   Hypotension.   Assessment & Plan:   Hypotension. Resolved with IVF rescucitation. Cortisol normal.  Did not require pressors.    Diarrhea with hypovolemia. - pt feels related to different formulation of cellcept > improved after this was  changed to home script - f/u GI PCR panel  AKI from hypovolemia. Hx of renal transplant. Non gap metabolic acidosis. - per renal-no need for HD right now   Possible pneumonia  - no definite infiltrate on CXR or CT imaging >> changes more likely represent atelectasis -procal was normal 10/9-recheck -de-escalate abx  -wean O2 for SpO2 >92% -IS  Hx of CAD, AVR, chronic combined CHF. - per primary team  Best practice:  Diet: heart healthy DVT prophylaxis: coumadin GI prophylaxis: not indicated Mobility: oob to chair Code Status:  full code Disposition: transfer back to [facility].    Labs   CBC: Recent Labs  Lab 04/02/2019 1822 03/28/19 0220 03/29/19 0703 03/29/19 1950 03/30/19 0216  WBC 21.4* 33.7* 24.0* 22.1* 20.9*  NEUTROABS 20.1*  --  22.3*  --   --   HGB 12.8* 12.2* 10.2* 11.7* 10.9*  HCT 38.8* 36.9* 33.8* 39.3 35.1*  MCV 87.8 87.9 93.6 95.6 92.4  PLT 163 170 126* 127* 121*    Basic Metabolic Panel: Recent Labs  Lab 04/06/2019 1822 03/28/19 0220 03/29/19 0703 03/29/19 1950 03/30/19 0216  NA 134* 136 135 134* 134*  K 4.1 4.3 4.3 4.4 4.2  CL 99 100 106 106 105  CO2 22 22 14* 14* 15*  GLUCOSE 136* 157* 139* 151* 161*  BUN 35* 41* 62* 66* 69*  CREATININE 1.64* 2.32* 4.31* 4.88* 5.13*  CALCIUM 9.1 8.8* 7.0* 7.1* 7.0*  MG  --   --  1.4*  --  2.2   GFR: Estimated Creatinine Clearance: 14.4 mL/min (A) (by C-G formula based on SCr of 5.13 mg/dL (H)). Recent Labs  Lab 03/29/2019 1822 03/28/19 0220 03/29/19 0703 03/29/19 1950 03/30/19 0216  PROCALCITON 0.18  --   --   --   --   WBC 21.4* 33.7* 24.0* 22.1* 20.9*  LATICACIDVEN 0.9 1.6  --  0.9  --     Liver Function Tests: Recent Labs  Lab 03/30/2019 1822 03/28/19 0220  AST 23 28  ALT 19 19  ALKPHOS 85 78  BILITOT 1.1 1.3*  PROT 7.1 6.5  ALBUMIN 4.3 3.9   No results for input(s): LIPASE, AMYLASE in the last 168 hours. No results for input(s): AMMONIA in the last 168 hours.  ABG No results found for: PHART, PCO2ART, PO2ART, HCO3, TCO2, ACIDBASEDEF, O2SAT   Coagulation Profile: Recent Labs  Lab 03/28/2019 1912 03/28/19 0708 03/29/19 0703 03/30/19 0216  INR 2.4* 2.1* 2.3* 2.8*    Cardiac Enzymes: No results for input(s): CKTOTAL, CKMB, CKMBINDEX, TROPONINI in the last 168 hours.  HbA1C: Hemoglobin A1C  Date/Time Value Ref Range Status  04/22/2017 02:11 PM 6.1  Final   Hgb A1c MFr Bld  Date/Time Value Ref Range Status  03/29/2019 07:03 AM 6.9 (H) 4.8 - 5.6 % Final    Comment:    (NOTE) Pre diabetes:          5.7%-6.4% Diabetes:               >6.4% Glycemic control for   <7.0% adults with diabetes   10/16/2016 10:42 AM 6.6 (H) 4.6 - 6.5 % Final    Comment:    Glycemic Control Guidelines for People with Diabetes:Non Diabetic:  <6%Goal of Therapy: <7%Additional Action Suggested:  >8%     CBG: Recent Labs  Lab 03/28/19 1640 03/28/19 2134 03/29/19 0649 03/29/19 1206 03/29/19 1854  GLUCAP 150* 125* 143* 162* 133*   D/W Hospitalist. They will assume care 10/13.   PCCM will  sign off. Thank you for the opportunity to participate in this patient's care. Please contact if we can be of further assistance.  Francine Graven, MSN, AGACNP  [facility]     Immunosuppressed from previous renal transplant, AKI, had diarrhea that has since resolved.  Blood pressures are resolved with volume resuscitation.  No sources of infection has been found.  Stable to step back down to the floor.  Agree with oral bicarb supplements.  Continue Foley catheter to carefully monitor urine output.  Appreciate nephrology's assistance.  Julian Hy, DO 03/30/19 10:18 AM [facility]

## 2019-03-31 ENCOUNTER — Encounter (HOSPITAL_COMMUNITY): Payer: Self-pay | Admitting: Infectious Disease

## 2019-03-31 ENCOUNTER — Ambulatory Visit: Payer: Medicare Other | Admitting: Nurse Practitioner

## 2019-03-31 DIAGNOSIS — I330 Acute and subacute infective endocarditis: Secondary | ICD-10-CM

## 2019-03-31 DIAGNOSIS — Z952 Presence of prosthetic heart valve: Secondary | ICD-10-CM

## 2019-03-31 DIAGNOSIS — Z95810 Presence of automatic (implantable) cardiac defibrillator: Secondary | ICD-10-CM

## 2019-03-31 DIAGNOSIS — E119 Type 2 diabetes mellitus without complications: Secondary | ICD-10-CM

## 2019-03-31 DIAGNOSIS — Z951 Presence of aortocoronary bypass graft: Secondary | ICD-10-CM

## 2019-03-31 DIAGNOSIS — I2510 Atherosclerotic heart disease of native coronary artery without angina pectoris: Secondary | ICD-10-CM

## 2019-03-31 DIAGNOSIS — J449 Chronic obstructive pulmonary disease, unspecified: Secondary | ICD-10-CM | POA: Diagnosis not present

## 2019-03-31 DIAGNOSIS — I38 Endocarditis, valve unspecified: Secondary | ICD-10-CM

## 2019-03-31 DIAGNOSIS — Z87891 Personal history of nicotine dependence: Secondary | ICD-10-CM

## 2019-03-31 HISTORY — DX: Endocarditis, valve unspecified: I38

## 2019-03-31 LAB — RENAL FUNCTION PANEL
Albumin: 3 g/dL — ABNORMAL LOW (ref 3.5–5.0)
Anion gap: 15 (ref 5–15)
BUN: 80 mg/dL — ABNORMAL HIGH (ref 8–23)
CO2: 15 mmol/L — ABNORMAL LOW (ref 22–32)
Calcium: 6.9 mg/dL — ABNORMAL LOW (ref 8.9–10.3)
Chloride: 104 mmol/L (ref 98–111)
Creatinine, Ser: 6.09 mg/dL — ABNORMAL HIGH (ref 0.61–1.24)
Glucose, Bld: 151 mg/dL — ABNORMAL HIGH (ref 70–99)
Phosphorus: 3.6 mg/dL (ref 2.5–4.6)
Potassium: 4.1 mmol/L (ref 3.5–5.1)
Sodium: 134 mmol/L — ABNORMAL LOW (ref 135–145)

## 2019-03-31 LAB — PROTIME-INR
INR: 4.5 (ref 0.8–1.2)
Prothrombin Time: 42.1 seconds — ABNORMAL HIGH (ref 11.4–15.2)

## 2019-03-31 MED ORDER — SODIUM CHLORIDE 0.9 % IV SOLN
2.0000 g | INTRAVENOUS | Status: DC
  Administered 2019-04-01 – 2019-04-06 (×6): 2 g via INTRAVENOUS
  Filled 2019-03-31: qty 20
  Filled 2019-03-31 (×2): qty 2
  Filled 2019-03-31: qty 20
  Filled 2019-03-31: qty 2
  Filled 2019-03-31 (×2): qty 20
  Filled 2019-03-31: qty 2

## 2019-03-31 MED ORDER — FUROSEMIDE 10 MG/ML IJ SOLN
40.0000 mg | Freq: Every day | INTRAMUSCULAR | Status: DC
  Administered 2019-03-31 – 2019-04-05 (×6): 40 mg via INTRAVENOUS
  Filled 2019-03-31 (×7): qty 4

## 2019-03-31 MED ORDER — SODIUM CHLORIDE 0.9 % IV SOLN
1.0000 g | INTRAVENOUS | Status: DC
  Administered 2019-03-31: 10:00:00 1 g via INTRAVENOUS
  Filled 2019-03-31: qty 1

## 2019-03-31 MED ORDER — SODIUM CHLORIDE 0.9 % IV SOLN
800.0000 mg | INTRAVENOUS | Status: DC
  Administered 2019-03-31 – 2019-04-04 (×3): 800 mg via INTRAVENOUS
  Filled 2019-03-31 (×3): qty 16

## 2019-03-31 NOTE — Progress Notes (Signed)
PROGRESS NOTE  John Barton  DOB: [birth year]  PCP: Tammi Sou, MD QZR:[id number]  DOA: 04/01/2019  LOS: 4 days   Brief narrative: Patient is a 73 y.o. 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 Jimmy Footman.   Patient presented to the ED on 10/9 with sudden onset of fever, chills and tremors associated with right-sided chest pain at home.  In the ED, patient had a temperature 100.2, blood pressure was initially normal and later trended down as low as 86/57.  Required low-flow oxygen supply to maintain O2 sat above 90%. Work-up in the ED showed n WBC count elevated to 21.4, hemoglobin 12.8, platelet 163, sodium 134, BUN/creatinine 35/1.64, INR therapeutic at 2.4, lactic acid level normal COVID-19 test negative. Urinalysis showed cloudy urine with WBC >50 and rare bacteria. Chest x-ray showed streaky airspace opacities in the left lung base possibly consolidation. CT abdomen and pelvis showed a 5.1 cm infrarenal abdominal aortic aneurysm.  Also left lower quadrant renal transplant with mild perinephric stranding no hydronephrosis. 1.8 cm bladder calculus.  Patient initiated on IV antibiotics and is was admitted for further evaluation and management.   Over the course of last 3 days, patient has had unstable blood pressure down as low as 60s.  Because of compromised renal perfusion, he has had AKI with significant rise in creatinine which is still ongoing. He was transferred to ICU on 10/11, briefly required pressors and transfer out on 10/12.  Subjective: Patient was seen and examined this morning.  Diarrhea has improved.  His biggest concern at this time is too much weight gain from fluid. Not on oxygen supplementation.  Blood pressure trend noted,  seems to be stabilizing.  Assessment/Plan: Sepsis with hypotension  -Met sepsis criteria on admission with leukocytosis, fever and potential source.   -Blood cultures did not show any growth.  Urine culture showed insignificant growth of less than 10,000 CFU per mL.  -Despite prolonged hypotension, lactic acid level normal.  -Currently on IV cefepime. -TTE showed a possible oscillating density in the mitral valve.  However, blood culture did not show any growth.  ID consultation called.  Cardiology consulted for TEE. -Blood pressure has now stabilized.  Patient is +9.4 liters.  Currently not on IV hydration.  Acute kidney injury on a transplant kidney Metabolic acidosis -Renal transplant in 1998.  Follows up with Dr. Jimmy Footman.  -Baseline creatinine 1.3-1.4.  Patient presented with creatinine elevated to 1.64, steadily worsening, 6.09 today.  Fluid balance +9.4 L.  Currently not on IV fluid.  Making some urine output. -Sodium bicarbonate remains low, 15 today.  Patient has been started on sodium bicarbonate 1300 mg twice daily. -Immunosuppression changed from CellCept to mycophenolate.  Diarrhea -Improved.   -Patient had few episodes of greenish fluids bowel movement in first 2 days of hospitalization which he states is because of brown-colored CellCept that he has been getting in the hospital.  Once the pill was switched, diarrhea stopped.   Type 2 diabetes mellitus -A1c 6.6 in 2018 -Glucose level remains controlled.  Mechanical aortic valve -On Coumadin, INR 2.4 on presentation.  Pharmacy to dose Coumadin to maintain INR between 2.5-3.  CAD s/p CABG 1995 Elevated troponin -Troponin was slightly elevated at 21 on admission. Currently not complaining of  chest pain.  Continue to monitor. -Home meds include aspirin, statin.  Continue the same.  Combined systolic and diastolic CHF  History of hypertension -Echo 10/10, EF 45 to 50%, unchanged. -Home meds include Lasix 40 mg daily,  lisinopril 5 mg daily.  Currently both on hold because of hypotension and AKI.    Sinus bradycardia  -s/p ICD in place   BPH  -continue Flomax.  Body mass index is 30.13 kg/m. Mobility: Ambulating independently.   DVT prophylaxis:  Coumadin Code Status:   Code Status: Full Code  Family Communication:  Expected Discharge:  Continue inpatient management for now.    Consultants:  Nephrology, cardiology, ID, critical care  Procedures:  None  Antimicrobials: Anti-infectives (From admission, onward)   Start     Dose/Rate Route Frequency Ordered Stop   03/31/19 2000  ceFEPIme (MAXIPIME) 1 g in sodium chloride 0.9 % 100 mL IVPB     1 g 200 mL/hr over 30 Minutes Intravenous Every 24 hours 03/31/19 0925     03/28/19 2200  vancomycin (VANCOCIN) 1,250 mg in sodium chloride 0.9 % 250 mL IVPB  Status:  Discontinued     1,250 mg 166.7 mL/hr over 90 Minutes Intravenous Every 24 hours 03/26/2019 2054 03/28/19 0824   03/28/19 2000  ceFEPIme (MAXIPIME) 2 g in sodium chloride 0.9 % 100 mL IVPB  Status:  Discontinued     2 g 200 mL/hr over 30 Minutes Intravenous Every 24 hours 03/28/19 0817 03/31/19 0925   03/28/19 0824  vancomycin variable dose per unstable renal function (pharmacist dosing)  Status:  Discontinued      Does not apply See admin instructions 03/28/19 0825 03/29/19 0958   03/28/19 0800  ceFEPIme (MAXIPIME) 2 g in sodium chloride 0.9 % 100 mL IVPB  Status:  Discontinued     2 g 200 mL/hr over 30 Minutes Intravenous Every 12 hours 04/06/2019 2054 03/28/19 0817   03/26/2019 1930  ceFEPIme (MAXIPIME) 2 g in sodium chloride 0.9 % 100 mL IVPB     2 g 200 mL/hr over 30 Minutes Intravenous  Once 04/15/2019 1921 04/05/2019 2055   03/25/2019 1930  metroNIDAZOLE (FLAGYL) IVPB 500 mg     500 mg 100 mL/hr over 60 Minutes Intravenous  Once 04/02/2019 1921 04/10/2019 2159   03/22/2019 1930  vancomycin (VANCOCIN) IVPB 1000 mg/200 mL premix  Status:  Discontinued     1,000 mg 200 mL/hr over 60 Minutes  Intravenous  Once 04/02/2019 1921 03/24/2019 1925   04/09/2019 1930  vancomycin (VANCOCIN) 1,750 mg in sodium chloride 0.9 % 500 mL IVPB     1,750 mg 250 mL/hr over 120 Minutes Intravenous  Once 04/18/2019 1925 03/19/2019 2303      Diet Order            Diet Heart Room service appropriate? Yes; Fluid consistency: Thin  Diet effective now              Infusions:  . sodium chloride Stopped (03/30/19 2231)  . ceFEPime (MAXIPIME) IV 1 g (03/31/19 1006)    Scheduled Meds: . aspirin EC  81 mg Oral Daily  . atorvastatin  80 mg Oral Daily  . calcitRIOL  0.5 mcg Oral Once per day on Tue Thu Sat   And  . calcitRIOL  1 mcg Oral Once per day on Sun Mon Wed Fri  . Chlorhexidine Gluconate Cloth  6 each Topical Daily  . influenza vaccine adjuvanted  0.5 mL Intramuscular Tomorrow-1000  . mouth rinse  15  mL Mouth Rinse BID  . mycophenolate  720 mg Oral BID  . pneumococcal 23 valent vaccine  0.5 mL Intramuscular Tomorrow-1000  . sodium bicarbonate  1,300 mg Oral BID  . tamsulosin  0.4 mg Oral Daily  . Warfarin - Pharmacist Dosing Inpatient   Does not apply q1800    PRN meds: sodium chloride, acetaminophen **OR** acetaminophen, diphenhydrAMINE, ondansetron **OR** ondansetron (ZOFRAN) IV   Objective: Vitals:   03/31/19 0404 03/31/19 0805  BP: 127/72 113/68  Pulse: 94 92  Resp: 18 19  Temp: 97.8 F (36.6 C) 97.6 F (36.4 C)  SpO2: 95% 93%    Intake/Output Summary (Last 24 hours) at 03/31/2019 1103 Last data filed at 03/31/2019 0807 Gross per 24 hour  Intake 857.92 ml  Output 202 ml  Net 655.92 ml   Filed Weights   04/05/2019 1811 03/30/19 0500 03/31/19 0404  Weight: 86.2 kg 93.4 kg 96.6 kg   Weight change: 3.216 kg Body mass index is 30.13 kg/m.   Physical Exam: General exam: Awake, appears uncomfortable because of puffy feeling.   Skin: No rashes, lesions or ulcers. HEENT: Atraumatic, normocephalic, supple neck, no obvious bleeding Lungs: Diminished air entry in both bases, no  crackles CVS: Regular rate and rhythm, mechanical sound on aortic area GI/Abd soft, nondistended, nontender, bowel sound present CNS: Alert, awake, oriented x3, slow to respond Psychiatry: Mood appropriate Extremities: Trace bilateral pedal edema, no calf tenderness  Data Review: I have personally reviewed the laboratory data and studies available.  Recent Labs  Lab 04/17/2019 1822 03/28/19 0220 03/29/19 0703 03/29/19 1950 03/30/19 0216  WBC 21.4* 33.7* 24.0* 22.1* 20.9*  NEUTROABS 20.1*  --  22.3*  --   --   HGB 12.8* 12.2* 10.2* 11.7* 10.9*  HCT 38.8* 36.9* 33.8* 39.3 35.1*  MCV 87.8 87.9 93.6 95.6 92.4  PLT 163 170 126* 127* 121*   Recent Labs  Lab 03/28/19 0220 03/29/19 0703 03/29/19 1950 03/30/19 0216 03/31/19 0450  NA 136 135 134* 134* 134*  K 4.3 4.3 4.4 4.2 4.1  CL 100 106 106 105 104  CO2 22 14* 14* 15* 15*  GLUCOSE 157* 139* 151* 161* 151*  BUN 41* 62* 66* 69* 80*  CREATININE 2.32* 4.31* 4.88* 5.13* 6.09*  CALCIUM 8.8* 7.0* 7.1* 7.0* 6.9*  MG  --  1.4*  --  2.2  --   PHOS  --   --   --   --  3.6    Terrilee Croak, MD  [facility] 03/31/2019

## 2019-03-31 NOTE — Care Management Important Message (Signed)
Important Message  Patient Details  Name: John Barton MRN: [id number] Date of Birth: 05/20/1946   Medicare Important Message Given:  Yes     [name] 03/31/2019, 12:50 PM

## 2019-03-31 NOTE — Progress Notes (Signed)
    [facility] has been requested to perform a transesophageal echocardiogram on 04/01/19 for bacteremia.  After careful review of history and examination, the risks and benefits of transesophageal echocardiogram have been explained including risks of esophageal damage, perforation (1:10,000 risk), bleeding, pharyngeal hematoma as well as other potential complications associated with conscious sedation including aspiration, arrhythmia, respiratory failure and death. Alternatives to treatment were discussed, questions were answered. Patient is willing to proceed. Of note INR 4.5 on morning labs. Will need repeat in am to determine if appropriate to proceed.   Reino Bellis, NP  03/31/2019 3:55 PM

## 2019-03-31 NOTE — Consult Note (Addendum)
[facility]    Date of Admission:  04/07/2019     Total days of antibiotics 5               Reason for Consult: Endocarditis  Referring Provider: D[place] Primary Care Provider: Tammi Sou, MD   ASSESSMENT:  John Barton is a 73 y/o male with fever and possible oscillating density on mitral valve with concern for endocarditis in the setting of previous aortic valve replacement and renal transplant. Blood cultures have been without growth to date. Primary team arranging for TEE. He currently has no evidence of pneumonia or UTI to explain his fever and hypotension on admission. Will broaden antibiotic therapy given current culture negative status with Daptomycin and ceftriaxone. Discussed plan of care and potential need for prolonged therapy and possible central line. From a general healthcare maintenance will check Hepatitis C antibody as John Barton was born in [birth year].   PLAN:  1. Start daptomycin and change cefepime to Ceftriaxone. 2. Await TEE results. 3. Continue to monitor blood cultures.  4. Check Hepatitis C antibody.    Principal Problem:   Endocarditis Active Problems:   Hx of CABG   Kidney replaced by transplant   Sepsis secondary to UTI [facility])   Community acquired pneumonia   . aspirin EC  81 mg Oral Daily  . atorvastatin  80 mg Oral Daily  . calcitRIOL  0.5 mcg Oral Once per day on Tue Thu Sat   And  . calcitRIOL  1 mcg Oral Once per day on Sun Mon Wed Fri  . Chlorhexidine Gluconate Cloth  6 each Topical Daily  . furosemide  40 mg Intravenous Daily  . influenza vaccine adjuvanted  0.5 mL Intramuscular Tomorrow-1000  . mouth rinse  15 mL Mouth Rinse BID  . mycophenolate  720 mg Oral BID  . pneumococcal 23 valent vaccine  0.5 mL Intramuscular Tomorrow-1000  . sodium bicarbonate  1,300 mg Oral BID  . tamsulosin  0.4 mg Oral Daily  . Warfarin - Pharmacist Dosing Inpatient   Does not apply q1800     HPI: John Barton is a 73  y.o. male with previous medical history significant for CAD s/p CABG and aortic valve replacement in 1995, renal transplant in 1998, Type 2 diabetes, COPD, ICD implantation in 2017, endocarditis presenting with 2 day history of worsening fever and chills.   Afebrile in the ED with stable vital signs. WBC count of 134 and creatinine of 1.64. D-dimer elevated at 3.86 and INR of 2.4. Chest x-ray with streaky airspace opacities in the left lung. CT scan of the abdomen and pelvis with no intra-abdominal findings and infrarenal aortic aneurysm. Blood cultures drawn and started on broad spectrum therapy with vancomycin, metronidazole and cefepime.   Echocardiogram completed on 03/28/19 with EF of 45-50% and possible oscillating density associated with the mitral valve. John Barton has remained afebrile since admission and leukocytosis has now improved to 20.9. Current antimicrobial regimen of cefepime.   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 This was followed by shaking chills and the feelings that he was not able to get warm. He contacted his daughter who recommended he go to the hospital for further evaluation. Denies any urinary symptoms. Has a chronic cough in the morning related to his COPD which has not changed.    Review of Systems: Review  of Systems  Constitutional: Positive for malaise/fatigue. Negative for chills, fever and weight loss.  Respiratory: Positive for cough. Negative for sputum production, shortness of breath and wheezing.   Cardiovascular: Negative for chest pain and leg swelling.  Gastrointestinal: Negative for abdominal pain, constipation, diarrhea, nausea and vomiting.  Genitourinary: Negative for dysuria, flank pain, frequency, hematuria and urgency.  Skin: Negative for rash.  Neurological: Negative for weakness and headaches.     Past Medical History:  Diagnosis Date  . Arthritis    . Bifascicular block    First degree AV block, right bundle branch block left anterior fascicular block.  Re-eval with event monitor 07/2017--cardiology  . Blood transfusion without reported diagnosis   . BPH with obstruction/lower urinary tract symptoms   . Carotid stenosis   . Chronic gout    due to renal impairment  . 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 Jimmy Footman.  GFR stable at 55 ml/min (Cr 1.28) 07/16/17.  Neph f/u 08/08/17, Cr up to 1.71, GFR 39 (in context of vol ovld & imp bladder emptying b/c out of flomax x 1 wk).  Cr  1.38 and GFR 50 ml/min as of 08/21/17 renal f/u.  Cr 1.27, GFR 56 on renal f/u 10/01/17. Cr 1.24, GFR 57 on 01/06/18. Cr 1.41, GFR 49 Jan 2020. Stab GFR 09/2018   . COPD (chronic obstructive pulmonary disease) ([place])   . Coronary artery disease    CABG 1995.  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 Diabetes mellitus without complication ([place]) 32/4401   New dx 10/15/16 by A1c criteria (6.6%)--recommended nutritionist referral/no meds at initial dx.  . 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 Glaucoma   . H/O paroxysmal supraventricular tachycardia    with wide complex. + Hx of a-fib as well.  Coumadin mgmt --nephrology  . History of renal transplant 1988  . Hyperlipidemia   . Hypertension   . ICD (implantable cardioverter-defibrillator) in place    Normal device check 01/28/16.  . Ischemic cardiomyopathy    Ischemic heart disease and mild depressed LV and RV function + DD and mild pulm HTN  . Post-transplant erythrocytosis   . 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  //  Echo 2/18: mild LVH, EF 45-50, diff HK, Gr 1 DD, severe MAC, mild Mitral Stenosis, normally functioning mechanical AVR (mean 12, peak 25).  Per cards, repeat echo planned for 2020.  Mar[place] Kitchen Secondary hyperparathyroidism ([place])    parathyroidectomy done; subsequently has  hypoparathyroidism, takes vit D  . Sinus bradycardia   . Syncope    He is s/p loop recorder insertion; as of Dr. Olin Pia f/u 01/16/16 he has had no further syncope and no significant arrhythmias.  He elected to keep the loop recorder in and was told to f/u 1 yr with Dr. Caryl Comes.   NORMAL DEVICE REMOTE TRANSMISSION--NO ATRIAL FIBRILLATION--as of 09/02/15.    Social History   Tobacco Use  . Smoking status: Former Smoker    Types: Cigars    Quit date: 05/29/2014    Years since quitting: 4.8  . Smokeless tobacco: Never Used  . Tobacco comment: quit cigarettes '06  Substance Use Topics  . Alcohol use: Yes    Alcohol/week: 14.0 standard drinks    Types: 14 Cans of beer per week    Comment: 2 beers/daily  . Drug use: No    Family History  Problem Relation Age of  Onset  . Heart failure Father   . Heart disease Father   . Hyperlipidemia Father   . Hypertension Father   . Diabetes Father   . Arthritis Mother   . Heart disease Mother   . Hyperlipidemia Mother   . Hypertension Mother   . Heart failure Mother   . Heart disease Brother   . Diabetes Daughter   . Alcohol abuse Brother   . Liver disease Brother        Liver transplant, ETOH    No Known Allergies  OBJECTIVE: Blood pressure 101/79, pulse 91, temperature 98.7 F (37.1 C), temperature source Oral, resp. rate 20, height 5' 10.5" (1.791 m), weight 96.6 kg, SpO2 94 %.  Physical Exam Constitutional:      General: He is not in acute distress.    Appearance: He is well-developed. He is ill-appearing.     Comments: Seated in the chair beside the bed; pleasant.   Cardiovascular:     Rate and Rhythm: Normal rate and regular rhythm.     Heart sounds: Normal heart sounds.  Pulmonary:     Effort: Pulmonary effort is normal.     Breath sounds: Normal breath sounds. No wheezing, rhonchi or rales.  Abdominal:     General: Abdomen is flat.     Palpations: Abdomen is soft.     Tenderness: There is no abdominal tenderness.  Skin:     General: Skin is warm and dry.  Neurological:     Mental Status: He is alert and oriented to person, place, and time.  Psychiatric:        Behavior: Behavior normal.        Thought Content: Thought content normal.        Judgment: Judgment normal.     Lab Results Lab Results  Component Value Date   WBC 20.9 (H) 03/30/2019   HGB 10.9 (L) 03/30/2019   HCT 35.1 (L) 03/30/2019   MCV 92.4 03/30/2019   PLT 121 (L) 03/30/2019    Lab Results  Component Value Date   CREATININE 6.09 (H) 03/31/2019   BUN 80 (H) 03/31/2019   NA 134 (L) 03/31/2019   K 4.1 03/31/2019   CL 104 03/31/2019   CO2 15 (L) 03/31/2019    Lab Results  Component Value Date   ALT 19 03/28/2019   AST 28 03/28/2019   ALKPHOS 78 03/28/2019   BILITOT 1.3 (H) 03/28/2019     Microbiology: Recent Results (from the past 240 hour(s))  SARS CORONAVIRUS 2 (TAT 6-24 HRS) Nasopharyngeal Nasopharyngeal Swab     Status: None   Collection Time: 03/24/2019  6:23 PM   Specimen: Nasopharyngeal Swab  Result Value Ref Range Status   SARS Coronavirus 2 NEGATIVE NEGATIVE Final    Comment: (NOTE) SARS-CoV-2 target nucleic acids are NOT DETECTED. The SARS-CoV-2 RNA is generally detectable in upper and lower respiratory specimens during the acute phase of infection. Negative results do not preclude SARS-CoV-2 infection, do not rule out co-infections with other pathogens, and should not be used as the sole basis for treatment or other patient management decisions. Negative results must be combined with clinical observations, patient history, and epidemiological information. The expected result is Negative. Fact Sheet for Patients: SugarRoll.be Fact Sheet for Healthcare Providers: https://www.woods-mathews.com/ This test is not yet approved or cleared by the [place] FDA and  has been authorized for detection and/or diagnosis of SARS-CoV-2 by FDA under an Emergency Use  Authorization (EUA). This EUA will remain  in  effect (meaning this test can be used) for the duration of the COVID-19 declaration under Section 56 4(b)(1) of the Act, 21 U.S.C. section 360bbb-3(b)(1), unless the authorization is terminated or revoked sooner. Performed at [facility], [place] [address] [place], [place] [address]   Culture, blood (routine x 2)     Status: None (Preliminary result)   Collection Time: 04/02/2019  6:47 PM   Specimen: BLOOD RIGHT ARM  Result Value Ref Range Status   Specimen Description BLOOD RIGHT ARM  Final   Special Requests   Final    BOTTLES DRAWN AEROBIC AND ANAEROBIC Blood Culture adequate volume   Culture   Final    NO GROWTH 3 DAYS Performed at [facility], [place] [address], [place], [place] [address]    Report Status PENDING  Incomplete  Culture, blood (routine x 2)     Status: None (Preliminary result)   Collection Time: 03/26/2019  7:00 PM   Specimen: BLOOD RIGHT WRIST  Result Value Ref Range Status   Specimen Description BLOOD RIGHT WRIST  Final   Special Requests   Final    BOTTLES DRAWN AEROBIC AND ANAEROBIC Blood Culture results may not be optimal due to an excessive volume of blood received in culture bottles   Culture   Final    NO GROWTH 3 DAYS Performed at [facility], [place] [address], [place], [place] [address]    Report Status PENDING  Incomplete  Urine culture     Status: Abnormal   Collection Time: 03/24/2019 11:15 PM   Specimen: Urine, Random  Result Value Ref Range Status   Specimen Description URINE, RANDOM  Final   Special Requests NONE  Final   Culture (A)  Final    <10,000 COLONIES/mL INSIGNIFICANT GROWTH Performed at [facility], [place] [address] [place], [place] [address]    Report Status 03/29/2019 FINAL  Final  MRSA PCR Screening     Status: None   Collection Time: 03/28/19  8:34 AM   Specimen: Nasal Mucosa; Nasopharyngeal  Result Value Ref Range Status   MRSA by PCR NEGATIVE NEGATIVE Final     Comment:        The GeneXpert MRSA Assay (FDA approved for NASAL specimens only), is one component of a comprehensive MRSA colonization surveillance program. It is not intended to diagnose MRSA infection nor to guide or monitor treatment for MRSA infections. Performed at [facility], [place] [address] [place], [place] [address]   C difficile quick scan w PCR reflex     Status: None   Collection Time: 03/29/19  2:47 AM   Specimen: STOOL  Result Value Ref Range Status   C Diff antigen NEGATIVE NEGATIVE Final   C Diff toxin NEGATIVE NEGATIVE Final   C Diff interpretation No C. difficile detected.  Final    Comment: Performed at [facility], [place] [address], [place], [place] [address]  GI pathogen panel by PCR, stool     Status: None   Collection Time: 03/29/19  8:17 AM   Specimen: Stool  Result Value Ref Range Status   Plesiomonas shigelloides NOT PERFORMED  Final    Comment: Test not performed   Yersinia enterocolitica NOT PERFORMED  Final    Comment: Test not performed   Vibrio NOT PERFORMED  Final    Comment: Test not performed   Enteropathogenic E coli NOT PERFORMED  Final    Comment: Test not performed  E coli (ETEC) LT/ST NOT PERFORMED  Final    Comment: Test not performed   E coli 0157 by PCR NOT PERFORMED  Final    Comment: Test not performed   Cryptosporidium by PCR NOT PERFORMED  Final    Comment: Test not performed   Entamoeba histolytica NOT PERFORMED  Final    Comment: Test not performed   Adenovirus F 40/41 NOT PERFORMED  Final    Comment: Test not performed   Norovirus GI/GII NOT PERFORMED  Final    Comment: Test not performed   Sapovirus NOT PERFORMED  Final    Comment: (NOTE) Test not performed Performed At: [facility] [place], [place] [id number] Rush Farmer MD WN:[id number]    Vibrio cholerae NOT PERFORMED  Final    Comment: Test not performed   Campylobacter by PCR IHK7  Final    Comment: (NOTE) The  specimen submitted does not meet the laboratory's criteria for acceptability. Refer to [employer] [facility] Services for specimen acceptability criteria.      Required: ParaPak Orange      Received: Sterile Container      Mayra Neer was notified 03/30/2019.    Salmonella by PCR NOT PERFORMED  Final    Comment: Test not performed   E coli (STEC) NOT PERFORMED  Final    Comment: Test not performed   Enteroaggregative E coli NOT PERFORMED  Final    Comment: Test not performed   Shigella by PCR NOT PERFORMED  Final    Comment: Test not performed   Cyclospora cayetanensis NOT PERFORMED  Final    Comment: Test not performed   Astrovirus NOT PERFORMED  Final    Comment: Test not performed   G lamblia by PCR NOT PERFORMED  Final    Comment: Test not performed   Rotavirus A by PCR NOT PERFORMED  Final    Comment: Test not performed CALLED TO L.KING,RN AT 1732 03/30/19 BY L.PITT  Performed at [facility], [place] [address], [place], [place] [address]      Terri Piedra, [place] [facility] [place] Pager  03/31/2019  2:22 PM

## 2019-03-31 NOTE — Progress Notes (Signed)
Patient ID: John Barton, male   DOB: July 06, 1945, 73 y.o.   MRN: [id number] S: Feels bloated and swollen O:BP 101/79 (BP Location: Right Arm)   Pulse 91   Temp 98.7 F (37.1 C) (Oral)   Resp 20   Ht 5' 10.5" (1.791 m)   Wt 96.6 kg Comment: scale c  SpO2 94%   BMI 30.13 kg/m   Intake/Output Summary (Last 24 hours) at 03/31/2019 1112 Last data filed at 03/31/2019 0807 Gross per 24 hour  Intake 857.92 ml  Output 202 ml  Net 655.92 ml   Intake/Output: I/O last 3 completed shifts: In: 1988.7 [P.O.:100; I.V.:1888.7] Out: 377 [Urine:375; Stool:2]  Intake/Output this shift:  Total I/O In: 340 [P.O.:340] Out: 1 [Stool:1] Weight change: 3.216 kg Gen: NAD CVS: no rub Resp: cta Abd: distended, +BS, soft, NT, renal allograft in LLQ and mildly tender, no bruit Ext: trace pretibial edema   Recent Labs  Lab 03/30/2019 1822 03/28/19 0220 03/29/19 0703 03/29/19 1950 03/30/19 0216 03/31/19 0450  NA 134* 136 135 134* 134* 134*  K 4.1 4.3 4.3 4.4 4.2 4.1  CL 99 100 106 106 105 104  CO2 22 22 14* 14* 15* 15*  GLUCOSE 136* 157* 139* 151* 161* 151*  BUN 35* 41* 62* 66* 69* 80*  CREATININE 1.64* 2.32* 4.31* 4.88* 5.13* 6.09*  ALBUMIN 4.3 3.9  --   --   --  3.0*  CALCIUM 9.1 8.8* 7.0* 7.1* 7.0* 6.9*  PHOS  --   --   --   --   --  3.6  AST 23 28  --   --   --   --   ALT 19 19  --   --   --   --    Liver Function Tests: Recent Labs  Lab 04/11/2019 1822 03/28/19 0220 03/31/19 0450  AST 23 28  --   ALT 19 19  --   ALKPHOS 85 78  --   BILITOT 1.1 1.3*  --   PROT 7.1 6.5  --   ALBUMIN 4.3 3.9 3.0*   No results for input(s): LIPASE, AMYLASE in the last 168 hours. No results for input(s): AMMONIA in the last 168 hours. CBC: Recent Labs  Lab 04/07/2019 1822 03/28/19 0220 03/29/19 0703 03/29/19 1950 03/30/19 0216  WBC 21.4* 33.7* 24.0* 22.1* 20.9*  NEUTROABS 20.1*  --  22.3*  --   --   HGB 12.8* 12.2* 10.2* 11.7* 10.9*  HCT 38.8* 36.9* 33.8* 39.3 35.1*  MCV 87.8 87.9 93.6  95.6 92.4  PLT 163 170 126* 127* 121*   Cardiac Enzymes: No results for input(s): CKTOTAL, CKMB, CKMBINDEX, TROPONINI in the last 168 hours. CBG: Recent Labs  Lab 03/28/19 1640 03/28/19 2134 03/29/19 0649 03/29/19 1206 03/29/19 1854  GLUCAP 150* 125* 143* 162* 133*    Iron Studies: No results for input(s): IRON, TIBC, TRANSFERRIN, FERRITIN in the last 72 hours. Studies/Results: No results found. [place] Kitchen aspirin EC  81 mg Oral Daily  . atorvastatin  80 mg Oral Daily  . calcitRIOL  0.5 mcg Oral Once per day on Tue Thu Sat   And  . calcitRIOL  1 mcg Oral Once per day on Sun Mon Wed Fri  . Chlorhexidine Gluconate Cloth  6 each Topical Daily  . influenza vaccine adjuvanted  0.5 mL Intramuscular Tomorrow-1000  . mouth rinse  15 mL Mouth Rinse BID  . mycophenolate  720 mg Oral BID  . pneumococcal 23 valent vaccine  0.5 mL  Intramuscular Tomorrow-1000  . sodium bicarbonate  1,300 mg Oral BID  . tamsulosin  0.4 mg Oral Daily  . Warfarin - Pharmacist Dosing Inpatient   Does not apply q1800    BMET    Component Value Date/Time   NA 134 (L) 03/31/2019 0450   NA 136 (A) 12/31/2018   K 4.1 03/31/2019 0450   K 5.3 11/17/2013   CL 104 03/31/2019 0450   CL 100 11/17/2013   CO2 15 (L) 03/31/2019 0450   GLUCOSE 151 (H) 03/31/2019 0450   BUN 80 (H) 03/31/2019 0450   BUN 22 (A) 12/31/2018   CREATININE 6.09 (H) 03/31/2019 0450   CREATININE 1.34 11/17/2013   CALCIUM 6.9 (L) 03/31/2019 0450   CALCIUM 10.0 11/17/2013   GFRNONAA NOT CALCULATED 03/31/2019 0450   GFRNONAA 54 11/17/2013   GFRAA NOT CALCULATED 03/31/2019 0450   GFRAA 62 11/17/2013   CBC    Component Value Date/Time   WBC 20.9 (H) 03/30/2019 0216   RBC 3.80 (L) 03/30/2019 0216   HGB 10.9 (L) 03/30/2019 0216   HGB 12.9 (L) 08/15/2017 0949   HCT 35.1 (L) 03/30/2019 0216   HCT 37.9 08/15/2017 0949   HCT 44 11/17/2013   PLT 121 (L) 03/30/2019 0216   PLT 206 08/15/2017 0949   MCV 92.4 03/30/2019 0216   MCV 86 08/15/2017  0949   MCH 28.7 03/30/2019 0216   MCHC 31.1 03/30/2019 0216   RDW 14.5 03/30/2019 0216   RDW 14.7 08/15/2017 0949   RDW 14.2 11/17/2013   LYMPHSABS 0.5 (L) 03/29/2019 0703   MONOABS 0.8 03/29/2019 0703   EOSABS 0.0 03/29/2019 0703   EOSABS 0 11/17/2013   BASOSABS 0.0 03/29/2019 0703   BASOSABS 1 11/17/2013     Assessment/Plan:  1. AKI/CKD stage 3 presumably due to ischemic ATN in setting of volume depletion and hypotension.  Remains oliguric despite IVF's, but UOP has picked up to 375 today.  No indication for dialysis but will need to continue to monitor closely 1. Will add lasix due to edema and follow, however if his bun/cr continue to climb, we may need to proceed with HD 2. Hypotension- presumably due to volume depletion with diarrhea but need to r/o sepsis.  Did have stranding on renal US and is tender.  Cultures negative to date. 3. Diarrhea- possibly due to cellcept but with leukocytosis and hypotension, concern for infectious etiology, however gi pathogen panel not performed for unclear reasons.  Will change to myfortic. 4. Metabolic acidosis- bicarb and follow 5. 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   Donetta Potts, MD [employer] [phone]

## 2019-03-31 NOTE — Progress Notes (Signed)
Pharmacy Antibiotic Note  John Barton is a 73 y.o. male admitted on 04/10/2019 with hypotension and fever. Noted that patient has a mechanical valve replacement of the aortic valve. TTE showed possible vegetation on mitral valve with no growth on blood cultures.  Pharmacy has been consulted for daptomycin dosing. WBC 20.9 and patient afebrile.    Plan: Daptomycin 800 mg (~ 8.3 mg/kg) every 48 hours Baseline CK tomorrow AM  Weekly CK monitoring F/U plans for TEE   Height: 5' 10.5" (179.1 cm) Weight: 213 lb (96.6 kg)(scale c) IBW/kg (Calculated) : 74.15  Temp (24hrs), Avg:97.9 F (36.6 C), Min:97.5 F (36.4 C), Max:98.7 F (37.1 C)  Recent Labs  Lab 03/25/2019 1822 03/28/19 0220 03/29/19 0703 03/29/19 0718 03/29/19 1950 03/30/19 0216 03/31/19 0450  WBC 21.4* 33.7* 24.0*  --  22.1* 20.9*  --   CREATININE 1.64* 2.32* 4.31*  --  4.88* 5.13* 6.09*  LATICACIDVEN 0.9 1.6  --   --  0.9  --   --   VANCORANDOM  --   --   --  13  --   --   --     Estimated Creatinine Clearance: 12.3 mL/min (A) (by C-G formula based on SCr of 6.09 mg/dL (H)).    No Known Allergies  Antimicrobials this admission: 10/09 Cefepime >>>10/13 10/13 Daptomycin>> 10/13 Ceftriaxone>>    Thank you for allowing pharmacy to be a part of this patient's care.  Jimmy Footman, PharmD, BCPS, BCIDP Infectious Diseases Clinical Pharmacist Phone: [phone] 03/31/2019 12:15 PM

## 2019-03-31 NOTE — Progress Notes (Signed)
ANTICOAGULATION CONSULT NOTE - Follow Up Consult  Pharmacy Consult for Warfarin + Cefepime Indication: Afib + mechanical AVR  + r/o sepsis  No Known Allergies  Patient Measurements: Height: 5' 10.5" (179.1 cm) Weight: 213 lb (96.6 kg)(scale c) IBW/kg (Calculated) : 74.15   Vital Signs: Temp: 97.6 F (36.4 C) (10/13 0805) Temp Source: Oral (10/13 0805) BP: 113/68 (10/13 0805) Pulse Rate: 92 (10/13 0805)  Labs: Recent Labs    03/29/19 0703 03/29/19 1950 03/30/19 0216 03/31/19 0450  HGB 10.2* 11.7* 10.9*  --   HCT 33.8* 39.3 35.1*  --   PLT 126* 127* 121*  --   LABPROT 25.3*  --  29.0* 42.1*  INR 2.3*  --  2.8* 4.5*  CREATININE 4.31* 4.88* 5.13* 6.09*    Estimated Creatinine Clearance: 12.3 mL/min (A) (by C-G formula based on SCr of 6.09 mg/dL (H)).   Assessment: Anticoag: Warfarin PTA for Afib + mechanical AVR (goal 2.5-3.5 per MD). INR 2.8>>4.5 **PTA regimen 7mg  Tues/Thurs, 5mg  AODs  ID: sepsis 2/2 ?UTI or PNA. PC 28.5 (10/12). WBC 20.9. Afebrile.Cr 2.3 >> 5>>6.09,  Vancomycin 10/09 >> 10/11 Cefepime 10/09 >>  Flagyl 10/09 X 1   10/11 Cdiff: neg 10/10 MRSA PCR: neg 10/9: Ucx: insignificant growth 10/09 BCx: ngtd 10/09 COVID: neg  Goal of Therapy:  INR 2.5-3.5 Monitor platelets by anticoagulation protocol: Yes   Plan:  Hold warfarin tonight. Daily INR Reduce Cefepime to 1g IV q 24h   Zaleah Ternes S. Alford Highland, PharmD, BCPS Clinical Staff Pharmacist Eilene Ghazi [place] 03/31/2019,9:25 AM

## 2019-04-01 DIAGNOSIS — Z881 Allergy status to other antibiotic agents status: Secondary | ICD-10-CM

## 2019-04-01 DIAGNOSIS — R509 Fever, unspecified: Secondary | ICD-10-CM

## 2019-04-01 LAB — RENAL FUNCTION PANEL
Albumin: 2.8 g/dL — ABNORMAL LOW (ref 3.5–5.0)
Anion gap: 14 (ref 5–15)
BUN: 86 mg/dL — ABNORMAL HIGH (ref 8–23)
CO2: 14 mmol/L — ABNORMAL LOW (ref 22–32)
Calcium: 6.9 mg/dL — ABNORMAL LOW (ref 8.9–10.3)
Chloride: 108 mmol/L (ref 98–111)
Creatinine, Ser: 6.02 mg/dL — ABNORMAL HIGH (ref 0.61–1.24)
GFR calc Af Amer: 10 mL/min — ABNORMAL LOW (ref >60)
GFR calc non Af Amer: 8 mL/min — ABNORMAL LOW (ref >60)
Glucose, Bld: 120 mg/dL — ABNORMAL HIGH (ref 70–99)
Phosphorus: 3.7 mg/dL (ref 2.5–4.6)
Potassium: 4 mmol/L (ref 3.5–5.1)
Sodium: 136 mmol/L (ref 135–145)

## 2019-04-01 LAB — CK: Total CK: 289 U/L (ref 49–397)

## 2019-04-01 LAB — HEPATITIS C ANTIBODY: HCV Ab: NONREACTIVE

## 2019-04-01 LAB — BASIC METABOLIC PANEL
Anion gap: 14 (ref 5–15)
BUN: 86 mg/dL — ABNORMAL HIGH (ref 8–23)
CO2: 14 mmol/L — ABNORMAL LOW (ref 22–32)
Calcium: 6.9 mg/dL — ABNORMAL LOW (ref 8.9–10.3)
Chloride: 109 mmol/L (ref 98–111)
Creatinine, Ser: 6 mg/dL — ABNORMAL HIGH (ref 0.61–1.24)
GFR calc Af Amer: 10 mL/min — ABNORMAL LOW (ref >60)
GFR calc non Af Amer: 8 mL/min — ABNORMAL LOW (ref >60)
Glucose, Bld: 119 mg/dL — ABNORMAL HIGH (ref 70–99)
Potassium: 4 mmol/L (ref 3.5–5.1)
Sodium: 137 mmol/L (ref 135–145)

## 2019-04-01 LAB — PROTIME-INR
INR: 6.6 (ref 0.8–1.2)
Prothrombin Time: 56.7 seconds — ABNORMAL HIGH (ref 11.4–15.2)

## 2019-04-01 LAB — CBC WITH DIFFERENTIAL/PLATELET
Abs Immature Granulocytes: 0 10*3/uL (ref 0.00–0.07)
Basophils Absolute: 0.1 10*3/uL (ref 0.0–0.1)
Basophils Relative: 1 %
Eosinophils Absolute: 0 10*3/uL (ref 0.0–0.5)
Eosinophils Relative: 0 %
HCT: 33.2 % — ABNORMAL LOW (ref 39.0–52.0)
Hemoglobin: 10.3 g/dL — ABNORMAL LOW (ref 13.0–17.0)
Lymphocytes Relative: 4 %
Lymphs Abs: 0.5 10*3/uL — ABNORMAL LOW (ref 0.7–4.0)
MCH: 28.1 pg (ref 26.0–34.0)
MCHC: 31 g/dL (ref 30.0–36.0)
MCV: 90.7 fL (ref 80.0–100.0)
Monocytes Absolute: 0.4 10*3/uL (ref 0.1–1.0)
Monocytes Relative: 3 %
Neutro Abs: 10.8 10*3/uL — ABNORMAL HIGH (ref 1.7–7.7)
Neutrophils Relative %: 92 %
Platelets: 150 10*3/uL (ref 150–400)
RBC: 3.66 MIL/uL — ABNORMAL LOW (ref 4.22–5.81)
RDW: 14.7 % (ref 11.5–15.5)
WBC: 11.7 10*3/uL — ABNORMAL HIGH (ref 4.0–10.5)
nRBC: 0 % (ref 0.0–0.2)
nRBC: 0 /100 WBC

## 2019-04-01 LAB — PHOSPHORUS: Phosphorus: 3.7 mg/dL (ref 2.5–4.6)

## 2019-04-01 LAB — MAGNESIUM: Magnesium: 2.2 mg/dL (ref 1.7–2.4)

## 2019-04-01 MED ORDER — SODIUM CHLORIDE 0.9 % IV SOLN
INTRAVENOUS | Status: DC
  Administered 2019-04-01: 09:00:00 via INTRAVENOUS

## 2019-04-01 NOTE — Progress Notes (Signed)
[facility]  Date of Admission:  04/17/2019     Total days of antibiotics 6          ASSESSMENT:  John Barton has remained afebrile with improved leukocytosis overnight. Awaiting TEE to rule out endocarditis as source of his fevers. Blood cultures remain without growth to date. Will continue current dose of daptomycin and ceftriaxone. Duration of treatment and plan pending TEE results. No further diarrhea at present.   PLAN:  1. Continue Daptomycin and ceftriaxone. 2. Monitor CK levels while on Daptomycin.  3. Renal following for AKI and renal transplant. 4. Await result of TEE.   Principal Problem:   Endocarditis Active Problems:   Hx of CABG   Kidney replaced by transplant   Sepsis secondary to UTI [facility])   Community acquired pneumonia   . aspirin EC  81 mg Oral Daily  . atorvastatin  80 mg Oral Daily  . calcitRIOL  0.5 mcg Oral Once per day on Tue Thu Sat   And  . calcitRIOL  1 mcg Oral Once per day on Sun Mon Wed Fri  . Chlorhexidine Gluconate Cloth  6 each Topical Daily  . furosemide  40 mg Intravenous Daily  . influenza vaccine adjuvanted  0.5 mL Intramuscular Tomorrow-1000  . mouth rinse  15 mL Mouth Rinse BID  . mycophenolate  720 mg Oral BID  . pneumococcal 23 valent vaccine  0.5 mL Intramuscular Tomorrow-1000  . sodium bicarbonate  1,300 mg Oral BID  . tamsulosin  0.4 mg Oral Daily  . Warfarin - Pharmacist Dosing Inpatient   Does not apply q1800    SUBJECTIVE:  Afebrile overnight with improved leukocytosis. Didn't sleep well last night. Awaiting TEE. Has a loop recorder and denies any history of ICD.   Allergies  Allergen Reactions  . Vancomycin     Acute renal failure and h/o renal transplant. DO NOT GIVE!     Review of Systems: Review of Systems  Constitutional: Negative for chills, fever and weight loss.  Respiratory: Negative for cough, shortness of breath and wheezing.   Cardiovascular: Negative for chest pain and  leg swelling.  Gastrointestinal: Negative for abdominal pain, constipation, diarrhea, nausea and vomiting.  Skin: Negative for rash.      OBJECTIVE: Vitals:   03/31/19 1925 04/01/19 0017 04/01/19 0032 04/01/19 0853  BP: 119/61 (!) 116/58  (!) 150/70  Pulse: 86 (!) 36 96 (!) 44  Resp: 17 18  18   Temp: (!) 97.5 F (36.4 C) 98.2 F (36.8 C)  97.7 F (36.5 C)  TempSrc: Oral Oral  Oral  SpO2: 94% 94%  95%  Weight:      Height:       Body mass index is 30.13 kg/m.  Physical Exam Constitutional:      General: He is not in acute distress.    Appearance: He is well-developed. He is not ill-appearing.     Comments: Lying in bed in right lateral recumbent position; pleasant.   Cardiovascular:     Rate and Rhythm: Normal rate and regular rhythm.     Heart sounds: Normal heart sounds.  Pulmonary:     Effort: Pulmonary effort is normal.     Breath sounds: Normal breath sounds.  Skin:    General: Skin is warm and dry.  Neurological:     Mental Status: He is alert and oriented to person, place, and time.  Psychiatric:        Mood and Affect: Mood  normal.     Lab Results Lab Results  Component Value Date   WBC 11.7 (H) 04/01/2019   HGB 10.3 (L) 04/01/2019   HCT 33.2 (L) 04/01/2019   MCV 90.7 04/01/2019   PLT 150 04/01/2019    Lab Results  Component Value Date   CREATININE 6.02 (H) 04/01/2019   CREATININE 6.00 (H) 04/01/2019   BUN 86 (H) 04/01/2019   BUN 86 (H) 04/01/2019   NA 136 04/01/2019   NA 137 04/01/2019   K 4.0 04/01/2019   K 4.0 04/01/2019   CL 108 04/01/2019   CL 109 04/01/2019   CO2 14 (L) 04/01/2019   CO2 14 (L) 04/01/2019    Lab Results  Component Value Date   ALT 19 03/28/2019   AST 28 03/28/2019   ALKPHOS 78 03/28/2019   BILITOT 1.3 (H) 03/28/2019     Microbiology: Recent Results (from the past 240 hour(s))  SARS CORONAVIRUS 2 (TAT 6-24 HRS) Nasopharyngeal Nasopharyngeal Swab     Status: None   Collection Time: 03/23/2019  6:23 PM    Specimen: Nasopharyngeal Swab  Result Value Ref Range Status   SARS Coronavirus 2 NEGATIVE NEGATIVE Final    Comment: (NOTE) SARS-CoV-2 target nucleic acids are NOT DETECTED. The SARS-CoV-2 RNA is generally detectable in upper and lower respiratory specimens during the acute phase of infection. Negative results do not preclude SARS-CoV-2 infection, do not rule out co-infections with other pathogens, and should not be used as the sole basis for treatment or other patient management decisions. Negative results must be combined with clinical observations, patient history, and epidemiological information. The expected result is Negative. Fact Sheet for Patients: SugarRoll.be Fact Sheet for Healthcare Providers: https://www.woods-mathews.com/ This test is not yet approved or cleared by the [place] FDA and  has been authorized for detection and/or diagnosis of SARS-CoV-2 by FDA under an Emergency Use Authorization (EUA). This EUA will remain  in effect (meaning this test can be used) for the duration of the COVID-19 declaration under Section 56 4(b)(1) of the Act, 21 U.S.C. section 360bbb-3(b)(1), unless the authorization is terminated or revoked sooner. Performed at [facility], [place] [address] [place], [place] [address]   Culture, blood (routine x 2)     Status: None (Preliminary result)   Collection Time: 04/06/2019  6:47 PM   Specimen: BLOOD RIGHT ARM  Result Value Ref Range Status   Specimen Description BLOOD RIGHT ARM  Final   Special Requests   Final    BOTTLES DRAWN AEROBIC AND ANAEROBIC Blood Culture adequate volume   Culture   Final    NO GROWTH 4 DAYS Performed at [facility], [place] [address], [place], [place] [address]    Report Status PENDING  Incomplete  Culture, blood (routine x 2)     Status: None (Preliminary result)   Collection Time: 03/31/2019  7:00 PM   Specimen: BLOOD RIGHT WRIST  Result Value Ref Range  Status   Specimen Description BLOOD RIGHT WRIST  Final   Special Requests   Final    BOTTLES DRAWN AEROBIC AND ANAEROBIC Blood Culture results may not be optimal due to an excessive volume of blood received in culture bottles   Culture   Final    NO GROWTH 4 DAYS Performed at [facility], [place] [address] [place], [place] [address]    Report Status PENDING  Incomplete  Urine culture     Status: Abnormal   Collection Time: 04/13/2019 11:15 PM  Specimen: Urine, Random  Result Value Ref Range Status   Specimen Description URINE, RANDOM  Final   Special Requests NONE  Final   Culture (A)  Final    <10,000 COLONIES/mL INSIGNIFICANT GROWTH Performed at [facility], [place] [address] [place], [place] [address]    Report Status 03/29/2019 FINAL  Final  MRSA PCR Screening     Status: None   Collection Time: 03/28/19  8:34 AM   Specimen: Nasal Mucosa; Nasopharyngeal  Result Value Ref Range Status   MRSA by PCR NEGATIVE NEGATIVE Final    Comment:        The GeneXpert MRSA Assay (FDA approved for NASAL specimens only), is one component of a comprehensive MRSA colonization surveillance program. It is not intended to diagnose MRSA infection nor to guide or monitor treatment for MRSA infections. Performed at [facility], [place] [address] [place], [place] [address]   C difficile quick scan w PCR reflex     Status: None   Collection Time: 03/29/19  2:47 AM   Specimen: STOOL  Result Value Ref Range Status   C Diff antigen NEGATIVE NEGATIVE Final   C Diff toxin NEGATIVE NEGATIVE Final   C Diff interpretation No C. difficile detected.  Final    Comment: Performed at [facility], [place] [address] [place],  [address]  GI pathogen panel by PCR, stool     Status: None   Collection Time: 03/29/19  8:17 AM   Specimen: Stool  Result Value Ref Range Status   Plesiomonas shigelloides NOT PERFORMED  Final    Comment: Test not performed   Yersinia enterocolitica NOT  PERFORMED  Final    Comment: Test not performed   Vibrio NOT PERFORMED  Final    Comment: Test not performed   Enteropathogenic E coli NOT PERFORMED  Final    Comment: Test not performed   E coli (ETEC) LT/ST NOT PERFORMED  Final    Comment: Test not performed   E coli 0157 by PCR NOT PERFORMED  Final    Comment: Test not performed   Cryptosporidium by PCR NOT PERFORMED  Final    Comment: Test not performed   Entamoeba histolytica NOT PERFORMED  Final    Comment: Test not performed   Adenovirus F 40/41 NOT PERFORMED  Final    Comment: Test not performed   Norovirus GI/GII NOT PERFORMED  Final    Comment: Test not performed   Sapovirus NOT PERFORMED  Final    Comment: (NOTE) Test not performed Performed At: [facility] [place], [place] [id number] Rush Farmer MD [id number]    Vibrio cholerae NOT PERFORMED  Final    Comment: Test not performed   Campylobacter by PCR [id number]  Final    Comment: (NOTE) The specimen submitted does not meet the laboratory's criteria for acceptability. Refer to [employer] [facility] Services for specimen acceptability criteria.      Required: ParaPak Orange      Received: Sterile Container      Mayra Neer was notified 03/30/2019.    Salmonella by PCR NOT PERFORMED  Final    Comment: Test not performed   E coli (STEC) NOT PERFORMED  Final    Comment: Test not performed   Enteroaggregative E coli NOT PERFORMED  Final    Comment: Test not performed   Shigella by PCR NOT PERFORMED  Final    Comment: Test not performed   Cyclospora cayetanensis NOT  PERFORMED  Final    Comment: Test not performed   Astrovirus NOT PERFORMED  Final    Comment: Test not performed   G lamblia by PCR NOT PERFORMED  Final    Comment: Test not performed   Rotavirus A by PCR NOT PERFORMED  Final    Comment: Test not performed CALLED TO L.KING,RN AT 1732 03/30/19 BY L.PITT  Performed at [facility], [place] [address] [place], [place]  [address]      Terri Piedra, [place] [facility] [phone] Pager  04/01/2019  10:09 AM

## 2019-04-01 NOTE — Plan of Care (Signed)
  Problem: Activity: Goal: Risk for activity intolerance will decrease Outcome: Progressing   Problem: Coping: Goal: Level of anxiety will decrease Outcome: Progressing   Problem: Safety: Goal: Ability to remain free from injury will improve Outcome: Progressing   

## 2019-04-01 NOTE — Plan of Care (Signed)
  Problem: Activity: Goal: Risk for activity intolerance will decrease Outcome: Progressing   Problem: Safety: Goal: Ability to remain free from injury will improve Outcome: Progressing   

## 2019-04-01 NOTE — Progress Notes (Signed)
ANTICOAGULATION CONSULT NOTE - Follow Up Consult  Pharmacy Consult for Warfarin + Cefepime Indication: Afib + mechanical AVR  + r/o sepsis  Allergies  Allergen Reactions  . Vancomycin     Acute renal failure and h/o renal transplant. DO NOT GIVE!    Patient Measurements: Height: 5' 10.5" (179.1 cm) Weight: 213 lb (96.6 kg)(scale c) IBW/kg (Calculated) : 74.15   Vital Signs: Temp: 98.2 F (36.8 C) (10/14 0017) Temp Source: Oral (10/14 0017) BP: 116/58 (10/14 0017) Pulse Rate: 96 (10/14 0032)  Labs: Recent Labs    03/29/19 1950 03/30/19 0216 03/31/19 0450 04/01/19 0534  HGB 11.7* 10.9*  --  10.3*  HCT 39.3 35.1*  --  33.2*  PLT 127* 121*  --  150  LABPROT  --  29.0* 42.1* 56.7*  INR  --  2.8* 4.5* 6.6*  CREATININE 4.88* 5.13* 6.09* 6.00*  6.02*  CKTOTAL  --   --   --  289    Estimated Creatinine Clearance: 12.5 mL/min (A) (by C-G formula based on SCr of 6.02 mg/dL (H)).   Assessment: Anticoag: Warfarin PTA for Afib + mechanical AVR (goal 2.5-3.5 per MD). INR 2.8>>4.5>6.6. Hgb 10.9>10.3. Plts 150 up. **PTA regimen 7mg  Tues/Thurs, 5mg  AODs  Antibiotic changes and worsening renal function noted.  Goal of Therapy:  INR 2.5-3.5 Monitor platelets by anticoagulation protocol: Yes   Plan:  Hold warfarin again tonight. Daily INR   John Barton S. Alford Highland, PharmD, BCPS Clinical Staff Pharmacist Eilene Ghazi Stillinger 04/01/2019,8:32 AM

## 2019-04-01 NOTE — Progress Notes (Signed)
PROGRESS NOTE  John Barton  DOB: [birth year]  PCP: Tammi Sou, MD WIO:[id number]  DOA: 04/07/2019  LOS: 5 days   Brief narrative: Patient is a 73 y.o. 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 Jimmy Footman.   Patient presented to the ED on 10/9 with sudden onset of fever, chills and tremors associated with right-sided chest pain at home.  In the ED, patient had a temperature 100.2, blood pressure was initially normal and later trended down as low as 86/57.  Required low-flow oxygen supply to maintain O2 sat above 90%. Work-up in the ED showed n WBC count elevated to 21.4, hemoglobin 12.8, platelet 163, sodium 134, BUN/creatinine 35/1.64, INR therapeutic at 2.4, lactic acid level normal COVID-19 test negative. Urinalysis showed cloudy urine with WBC >50 and rare bacteria. Chest x-ray showed streaky airspace opacities in the left lung base possibly consolidation. CT abdomen and pelvis showed a 5.1 cm infrarenal abdominal aortic aneurysm.  Also left lower quadrant renal transplant with mild perinephric stranding no hydronephrosis. 1.8 cm bladder calculus.  Patient initiated on IV antibiotics and is was admitted for further evaluation and management.   Over the course of last 3 days, patient has had unstable blood pressure down as low as 60s.  Because of compromised renal perfusion, he has had AKI with significant rise in creatinine which is still ongoing. He was transferred to ICU on 10/11, briefly required pressors and transfer out on 10/12.  Subjective: Patient was seen and examined this morning.  Diarrhea has improved.  His biggest concern at this time is too much weight gain from fluid. Not on oxygen supplementation.  Blood pressure trend noted,  seems to be stabilizing.  Assessment/Plan: Sepsis with hypotension  -Met sepsis criteria on admission with leukocytosis, fever and potential source.   -Blood cultures did not show any growth.  Urine culture showed insignificant growth of less than 10,000 CFU per mL.  -Despite prolonged hypotension, lactic acid level normal.  -TTE showed a possible oscillating density in the mitral valve.  However, blood culture did not show any growth. -Cardiology planned to do TEE today.  But unable because of elevated INR. -ID consultation appreciated.  Currently on IV Rocephin and IV daptomycin.  -Blood pressure has now stabilized.  Patient is +9.4 liters.  Currently not on IV hydration.  Acute kidney injury on a transplant kidney Metabolic acidosis -Renal transplant in 1998.  Follows up with Dr. Jimmy Footman.  -Baseline creatinine 1.3-1.4.  Patient presented with creatinine elevated to 1.64, steadily worsening, peaked at 6.09, 6.02 today.  Fluid balance +9.4 L.  Currently not on IV fluid.  Making some urine output. -Sodium bicarbonate remains low, 15 today.  Continue sodium bicarbonate 1300 mg twice daily. -Immunosuppression changed from CellCept to mycophenolate by nephrology. -If no further improvement in creatinine, patient may end up requiring dialysis.  Diarrhea -Improved.   -Patient had few episodes of greenish fluids bowel movement in first 2 days of hospitalization which he states is because of brown-colored CellCept that he has been getting in the hospital.  Once the pill was switched, diarrhea stopped.   Type 2 diabetes mellitus -A1c 6.6 in 2018 -Glucose level remains controlled.  Mechanical aortic valve Supratherapeutic INR -On Coumadin, INR 2.4 on presentation.  Pharmacy  to dose Coumadin to maintain INR between 2.5-3. -INR elevated to 6.6 today. -No spontaneous bleeding noted.  Hemoglobin stable. -Monitor INR daily.  CAD s/p CABG 1995 Elevated troponin -Troponin was slightly elevated  at 21 on admission. Currently not complaining of chest pain.  Continue to monitor. -Home meds include aspirin, statin.  Continue the same.  Combined systolic and diastolic CHF  History of hypertension -Echo 10/10, EF 45 to 50%, unchanged. -Home meds include Lasix 40 mg daily, lisinopril 5 mg daily. Currently both on hold because of hypotension and AKI.    Sinus bradycardia  -s/p ICD in place   BPH  -continue Flomax.  Mobility: Ambulating independently.   DVT prophylaxis:  Coumadin Code Status:   Code Status: Full Code  Family Communication:  Expected Discharge:  Continue inpatient management for now.    Consultants:  Nephrology, cardiology, ID, critical care  Procedures:  None  Antimicrobials: Anti-infectives (From admission, onward)   Start     Dose/Rate Route Frequency Ordered Stop   04/01/19 1000  cefTRIAXone (ROCEPHIN) 2 g in sodium chloride 0.9 % 100 mL IVPB     2 g 200 mL/hr over 30 Minutes Intravenous Every 24 hours 03/31/19 1205     03/31/19 2000  ceFEPIme (MAXIPIME) 1 g in sodium chloride 0.9 % 100 mL IVPB  Status:  Discontinued     1 g 200 mL/hr over 30 Minutes Intravenous Every 24 hours 03/31/19 0925 03/31/19 1205   03/31/19 1600  DAPTOmycin (CUBICIN) 800 mg in sodium chloride 0.9 % IVPB     800 mg 232 mL/hr over 30 Minutes Intravenous Every 48 hours 03/31/19 1209     03/28/19 2200  vancomycin (VANCOCIN) 1,250 mg in sodium chloride 0.9 % 250 mL IVPB  Status:  Discontinued     1,250 mg 166.7 mL/hr over 90 Minutes Intravenous Every 24 hours 04/13/2019 2054 03/28/19 0824   03/28/19 2000  ceFEPIme (MAXIPIME) 2 g in sodium chloride 0.9 % 100 mL IVPB  Status:  Discontinued     2 g 200 mL/hr over 30 Minutes Intravenous Every 24 hours 03/28/19 0817 03/31/19 0925   03/28/19 0824  vancomycin variable dose per unstable renal function (pharmacist dosing)  Status:  Discontinued      Does not apply See admin instructions 03/28/19 0825 03/29/19 0958   03/28/19 0800  ceFEPIme  (MAXIPIME) 2 g in sodium chloride 0.9 % 100 mL IVPB  Status:  Discontinued     2 g 200 mL/hr over 30 Minutes Intravenous Every 12 hours 04/04/2019 2054 03/28/19 0817   04/01/2019 1930  ceFEPIme (MAXIPIME) 2 g in sodium chloride 0.9 % 100 mL IVPB     2 g 200 mL/hr over 30 Minutes Intravenous  Once 04/04/2019 1921 03/20/2019 2055   04/18/2019 1930  metroNIDAZOLE (FLAGYL) IVPB 500 mg     500 mg 100 mL/hr over 60 Minutes Intravenous  Once 04/06/2019 1921 04/06/2019 2159   03/29/2019 1930  vancomycin (VANCOCIN) IVPB 1000 mg/200 mL premix  Status:  Discontinued     1,000 mg 200 mL/hr over 60 Minutes Intravenous  Once 04/12/2019 1921 03/30/2019 1925   03/22/2019 1930  vancomycin (VANCOCIN) 1,750 mg in sodium chloride 0.9 % 500 mL IVPB     1,750 mg 250 mL/hr over 120 Minutes Intravenous  Once 04/10/2019 1925 03/31/2019 2303      Diet Order            Diet Heart Room service appropriate? Yes; Fluid consistency: Thin  Diet effective now  Infusions:  . sodium chloride 1,000 mL (03/31/19 1524)  . sodium chloride Stopped (04/01/19 1257)  . cefTRIAXone (ROCEPHIN)  IV 2 g (04/01/19 0836)  . DAPTOmycin (CUBICIN)  IV 800 mg (03/31/19 1524)    Scheduled Meds: . aspirin EC  81 mg Oral Daily  . atorvastatin  80 mg Oral Daily  . calcitRIOL  0.5 mcg Oral Once per day on Tue Thu Sat   And  . calcitRIOL  1 mcg Oral Once per day on Sun Mon Wed Fri  . Chlorhexidine Gluconate Cloth  6 each Topical Daily  . furosemide  40 mg Intravenous Daily  . influenza vaccine adjuvanted  0.5 mL Intramuscular Tomorrow-1000  . mouth rinse  15 mL Mouth Rinse BID  . mycophenolate  720 mg Oral BID  . pneumococcal 23 valent vaccine  0.5 mL Intramuscular Tomorrow-1000  . sodium bicarbonate  1,300 mg Oral BID  . tamsulosin  0.4 mg Oral Daily  . Warfarin - Pharmacist Dosing Inpatient   Does not apply q1800    PRN meds: sodium chloride, acetaminophen **OR** acetaminophen, diphenhydrAMINE, ondansetron **OR** ondansetron (ZOFRAN)  IV   Objective: Vitals:   04/01/19 0853 04/01/19 1152  BP: (!) 150/70 (!) 139/97  Pulse: (!) 44 99  Resp: 18 20  Temp: 97.7 F (36.5 C) 97.7 F (36.5 C)  SpO2: 95% 95%    Intake/Output Summary (Last 24 hours) at 04/01/2019 1543 Last data filed at 04/01/2019 1427 Gross per 24 hour  Intake 779.89 ml  Output 725 ml  Net 54.89 ml   Filed Weights   03/30/2019 1811 03/30/19 0500 03/31/19 0404  Weight: 86.2 kg 93.4 kg 96.6 kg   Weight change:  Body mass index is 30.13 kg/m.   Physical Exam: General exam: Awake, appears uncomfortable because of puffy feeling.   Skin: No rashes, lesions or ulcers. HEENT: Atraumatic, normocephalic, supple neck, no obvious bleeding Lungs: Diminished air entry in both bases, no crackles CVS: Regular rate and rhythm, mechanical sound on aortic area GI/Abd soft, nondistended, nontender, bowel sound present CNS: Alert, awake, oriented x3, slow to respond Psychiatry: Mood appropriate Extremities: Trace bilateral pedal edema, no calf tenderness  Data Review: I have personally reviewed the laboratory data and studies available.  Recent Labs  Lab 04/11/2019 1822 03/28/19 0220 03/29/19 0703 03/29/19 1950 03/30/19 0216 04/01/19 0534  WBC 21.4* 33.7* 24.0* 22.1* 20.9* 11.7*  NEUTROABS 20.1*  --  22.3*  --   --  10.8*  HGB 12.8* 12.2* 10.2* 11.7* 10.9* 10.3*  HCT 38.8* 36.9* 33.8* 39.3 35.1* 33.2*  MCV 87.8 87.9 93.6 95.6 92.4 90.7  PLT 163 170 126* 127* 121* 150   Recent Labs  Lab 03/29/19 0703 03/29/19 1950 03/30/19 0216 03/31/19 0450 04/01/19 0534  NA 135 134* 134* 134* 137  136  K 4.3 4.4 4.2 4.1 4.0  4.0  CL 106 106 105 104 109  108  CO2 14* 14* 15* 15* 14*  14*  GLUCOSE 139* 151* 161* 151* 119*  120*  BUN 62* 66* 69* 80* 86*  86*  CREATININE 4.31* 4.88* 5.13* 6.09* 6.00*  6.02*  CALCIUM 7.0* 7.1* 7.0* 6.9* 6.9*  6.9*  MG 1.4*  --  2.2  --  2.2  PHOS  --   --   --  3.6 3.7  3.7    Terrilee Croak, MD  [facility]  04/01/2019

## 2019-04-01 NOTE — Progress Notes (Signed)
Patient ID: John Barton, male   DOB: 02-26-1946, 73 y.o.   MRN: [id number] S: Feeling a little better but upset that he has been npo since yesterday waiting for a procedure O:BP (!) 139/97 (BP Location: Right Arm)   Pulse 99   Temp 97.7 F (36.5 C) (Oral)   Resp 20   Ht 5' 10.5" (1.791 m)   Wt 96.6 kg Comment: scale c  SpO2 95%   BMI 30.13 kg/m   Intake/Output Summary (Last 24 hours) at 04/01/2019 1406 Last data filed at 04/01/2019 1159 Gross per 24 hour  Intake 602.89 ml  Output 925 ml  Net -322.11 ml   Intake/Output: I/O last 3 completed shifts: In: 1557.8 [P.O.:1054; I.V.:318; IV Piggyback:185.9] Out: 652 [Urine:650; Stool:2]  Intake/Output this shift:  Total I/O In: 0  Out: 400 [Urine:400] Weight change:  Gen: NAD CVS: no rub Resp: cta Abd: +BS, soft, NT Ext: trace edema lower ext  Recent Labs  Lab 04/12/2019 1822 03/28/19 0220 03/29/19 0703 03/29/19 1950 03/30/19 0216 03/31/19 0450 04/01/19 0534  NA 134* 136 135 134* 134* 134* 137  136  K 4.1 4.3 4.3 4.4 4.2 4.1 4.0  4.0  CL 99 100 106 106 105 104 109  108  CO2 22 22 14* 14* 15* 15* 14*  14*  GLUCOSE 136* 157* 139* 151* 161* 151* 119*  120*  BUN 35* 41* 62* 66* 69* 80* 86*  86*  CREATININE 1.64* 2.32* 4.31* 4.88* 5.13* 6.09* 6.00*  6.02*  ALBUMIN 4.3 3.9  --   --   --  3.0* 2.8*  CALCIUM 9.1 8.8* 7.0* 7.1* 7.0* 6.9* 6.9*  6.9*  PHOS  --   --   --   --   --  3.6 3.7  3.7  AST 23 28  --   --   --   --   --   ALT 19 19  --   --   --   --   --    Liver Function Tests: Recent Labs  Lab 03/29/2019 1822 03/28/19 0220 03/31/19 0450 04/01/19 0534  AST 23 28  --   --   ALT 19 19  --   --   ALKPHOS 85 78  --   --   BILITOT 1.1 1.3*  --   --   PROT 7.1 6.5  --   --   ALBUMIN 4.3 3.9 3.0* 2.8*   No results for input(s): LIPASE, AMYLASE in the last 168 hours. No results for input(s): AMMONIA in the last 168 hours. CBC: Recent Labs  Lab 04/10/2019 1822 03/28/19 0220 03/29/19 0703 03/29/19 1950  03/30/19 0216 04/01/19 0534  WBC 21.4* 33.7* 24.0* 22.1* 20.9* 11.7*  NEUTROABS 20.1*  --  22.3*  --   --  10.8*  HGB 12.8* 12.2* 10.2* 11.7* 10.9* 10.3*  HCT 38.8* 36.9* 33.8* 39.3 35.1* 33.2*  MCV 87.8 87.9 93.6 95.6 92.4 90.7  PLT 163 170 126* 127* 121* 150   Cardiac Enzymes: Recent Labs  Lab 04/01/19 0534  CKTOTAL 289   CBG: Recent Labs  Lab 03/28/19 1640 03/28/19 2134 03/29/19 0649 03/29/19 1206 03/29/19 1854  GLUCAP 150* 125* 143* 162* 133*    Iron Studies: No results for input(s): IRON, TIBC, TRANSFERRIN, FERRITIN in the last 72 hours. Studies/Results: No results found. Marland Kitchen aspirin EC  81 mg Oral Daily  . atorvastatin  80 mg Oral Daily  . calcitRIOL  0.5 mcg Oral Once per day on Tue Thu Sat  And  . calcitRIOL  1 mcg Oral Once per day on Sun Mon Wed Fri  . Chlorhexidine Gluconate Cloth  6 each Topical Daily  . furosemide  40 mg Intravenous Daily  . influenza vaccine adjuvanted  0.5 mL Intramuscular Tomorrow-1000  . mouth rinse  15 mL Mouth Rinse BID  . mycophenolate  720 mg Oral BID  . pneumococcal 23 valent vaccine  0.5 mL Intramuscular Tomorrow-1000  . sodium bicarbonate  1,300 mg Oral BID  . tamsulosin  0.4 mg Oral Daily  . Warfarin - Pharmacist Dosing Inpatient   Does not apply q1800    BMET    Component Value Date/Time   NA 136 04/01/2019 0534   NA 137 04/01/2019 0534   NA 136 (A) 12/31/2018   K 4.0 04/01/2019 0534   K 4.0 04/01/2019 0534   K 5.3 11/17/2013   CL 108 04/01/2019 0534   CL 109 04/01/2019 0534   CL 100 11/17/2013   CO2 14 (L) 04/01/2019 0534   CO2 14 (L) 04/01/2019 0534   GLUCOSE 120 (H) 04/01/2019 0534   GLUCOSE 119 (H) 04/01/2019 0534   BUN 86 (H) 04/01/2019 0534   BUN 86 (H) 04/01/2019 0534   BUN 22 (A) 12/31/2018   CREATININE 6.02 (H) 04/01/2019 0534   CREATININE 6.00 (H) 04/01/2019 0534   CREATININE 1.34 11/17/2013   CALCIUM 6.9 (L) 04/01/2019 0534   CALCIUM 6.9 (L) 04/01/2019 0534   CALCIUM 10.0 11/17/2013   GFRNONAA  8 (L) 04/01/2019 0534   GFRNONAA 8 (L) 04/01/2019 0534   GFRNONAA 54 11/17/2013   GFRAA 10 (L) 04/01/2019 0534   GFRAA 10 (L) 04/01/2019 0534   GFRAA 62 11/17/2013   CBC    Component Value Date/Time   WBC 11.7 (H) 04/01/2019 0534   RBC 3.66 (L) 04/01/2019 0534   HGB 10.3 (L) 04/01/2019 0534   HGB 12.9 (L) 08/15/2017 0949   HCT 33.2 (L) 04/01/2019 0534   HCT 37.9 08/15/2017 0949   HCT 44 11/17/2013   PLT 150 04/01/2019 0534   PLT 206 08/15/2017 0949   MCV 90.7 04/01/2019 0534   MCV 86 08/15/2017 0949   MCH 28.1 04/01/2019 0534   MCHC 31.0 04/01/2019 0534   RDW 14.7 04/01/2019 0534   RDW 14.7 08/15/2017 0949   RDW 14.2 11/17/2013   LYMPHSABS 0.5 (L) 04/01/2019 0534   MONOABS 0.4 04/01/2019 0534   EOSABS 0.0 04/01/2019 0534   EOSABS 0 11/17/2013   BASOSABS 0.1 04/01/2019 0534   BASOSABS 1 11/17/2013     Assessment/Plan:  1. AKI/CKD stage 3 presumably due to ischemic ATN in setting of volume depletion and hypotension. Remains oliguric despite IVF's, but UOP has picked up to 375 today. No indication for dialysis but will need to continue to monitor closely 1. Restarted lasix 03/31/19 due to edema  2. Cr seems to have reached a plateau, however if his bun/cr continue to climb, we may need to proceed with HD 2. Hypotension- presumably due to volume depletion with diarrhea but need to r/o sepsis. Did have stranding on renal US and is tender. Cultures negative to date.  TTE revealed mobile mass on mitral valve concerning for vegetation.  For TEE to evaluate for possible SBE.  Currently on dapto and ceftriaxone per ID. 3. Diarrhea- possibly due to cellcept but with leukocytosis and hypotension, concern for infectious etiology, however gi pathogen panel not performed for unclear reasons. Will change to myfortic. 4. Metabolic acidosis- bicarb and follow 5. 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   Donetta Potts, MD [employer] [phone]

## 2019-04-02 DIAGNOSIS — D72829 Elevated white blood cell count, unspecified: Secondary | ICD-10-CM

## 2019-04-02 DIAGNOSIS — J189 Pneumonia, unspecified organism: Secondary | ICD-10-CM

## 2019-04-02 DIAGNOSIS — R768 Other specified abnormal immunological findings in serum: Secondary | ICD-10-CM

## 2019-04-02 LAB — RENAL FUNCTION PANEL
Albumin: 2.8 g/dL — ABNORMAL LOW (ref 3.5–5.0)
Anion gap: 13 (ref 5–15)
BUN: 82 mg/dL — ABNORMAL HIGH (ref 8–23)
CO2: 16 mmol/L — ABNORMAL LOW (ref 22–32)
Calcium: 7 mg/dL — ABNORMAL LOW (ref 8.9–10.3)
Chloride: 109 mmol/L (ref 98–111)
Creatinine, Ser: 4.39 mg/dL — ABNORMAL HIGH (ref 0.61–1.24)
GFR calc Af Amer: 14 mL/min — ABNORMAL LOW (ref >60)
GFR calc non Af Amer: 12 mL/min — ABNORMAL LOW (ref >60)
Glucose, Bld: 126 mg/dL — ABNORMAL HIGH (ref 70–99)
Phosphorus: 3 mg/dL (ref 2.5–4.6)
Potassium: 3.5 mmol/L (ref 3.5–5.1)
Sodium: 138 mmol/L (ref 135–145)

## 2019-04-02 LAB — CBC WITH DIFFERENTIAL/PLATELET
Abs Immature Granulocytes: 0 10*3/uL (ref 0.00–0.07)
Basophils Absolute: 0 10*3/uL (ref 0.0–0.1)
Basophils Relative: 0 %
Eosinophils Absolute: 0 10*3/uL (ref 0.0–0.5)
Eosinophils Relative: 0 %
HCT: 32.9 % — ABNORMAL LOW (ref 39.0–52.0)
Hemoglobin: 10.6 g/dL — ABNORMAL LOW (ref 13.0–17.0)
Lymphocytes Relative: 4 %
Lymphs Abs: 0.4 10*3/uL — ABNORMAL LOW (ref 0.7–4.0)
MCH: 28.6 pg (ref 26.0–34.0)
MCHC: 32.2 g/dL (ref 30.0–36.0)
MCV: 88.9 fL (ref 80.0–100.0)
Monocytes Absolute: 0.3 10*3/uL (ref 0.1–1.0)
Monocytes Relative: 3 %
Neutro Abs: 9.5 10*3/uL — ABNORMAL HIGH (ref 1.7–7.7)
Neutrophils Relative %: 93 %
Platelets: 172 10*3/uL (ref 150–400)
RBC: 3.7 MIL/uL — ABNORMAL LOW (ref 4.22–5.81)
RDW: 14.6 % (ref 11.5–15.5)
WBC: 10.2 10*3/uL (ref 4.0–10.5)
nRBC: 0 % (ref 0.0–0.2)
nRBC: 0 /100 WBC

## 2019-04-02 LAB — CULTURE, BLOOD (ROUTINE X 2)
Culture: NO GROWTH
Culture: NO GROWTH
Special Requests: ADEQUATE

## 2019-04-02 LAB — PROTIME-INR
INR: 5.7 (ref 0.8–1.2)
Prothrombin Time: 50.3 seconds — ABNORMAL HIGH (ref 11.4–15.2)

## 2019-04-02 MED ORDER — VITAMIN K1 10 MG/ML IJ SOLN
5.0000 mg | INTRAVENOUS | Status: AC
  Administered 2019-04-02: 5 mg via INTRAVENOUS
  Filled 2019-04-02: qty 0.5

## 2019-04-02 NOTE — Progress Notes (Signed)
Patient with reddish/orange color urine, per patient nephrology is aware. Per nephrologist note from today to d/c foley due to hematuria, RN went in to speak with patient about foley removal, per patient he is not removing foley due to his prostate and kidney problems, because it will be too much trauma if we try to put one back in. He say it started happening since he started taking  one of the med's we got him on. Will continue to monitor.

## 2019-04-02 NOTE — Progress Notes (Addendum)
ANTICOAGULATION CONSULT NOTE - Follow Up Consult  Pharmacy Consult for Warfarin Indication: Afib + mechanical AVR   Allergies  Allergen Reactions  . Vancomycin     Acute renal failure and h/o renal transplant. DO NOT GIVE!    Patient Measurements: Height: 5' 10.5" (179.1 cm) Weight: 210 lb 12.8 oz (95.6 kg)(scale c) IBW/kg (Calculated) : 74.15   Vital Signs: Temp: 98.3 F (36.8 C) (10/15 0827) Temp Source: Oral (10/15 0827) BP: 142/67 (10/15 0854) Pulse Rate: 81 (10/15 0854)  Labs: Recent Labs    03/31/19 0450 04/01/19 0534 04/02/19 0716  HGB  --  10.3* 10.6*  HCT  --  33.2* 32.9*  PLT  --  150 172  LABPROT 42.1* 56.7* 50.3*  INR 4.5* 6.6* 5.7*  CREATININE 6.09* 6.00*  6.02* 4.39*  CKTOTAL  --  289  --     Estimated Creatinine Clearance: 17 mL/min (A) (by C-G formula based on SCr of 4.39 mg/dL (H)).   Assessment: Anticoag: Warfarin PTA for Afib + mechanical AVR (goal 2.5-3.5 per MD). INR 2.8>>4.5>6.6>5.7. Hgb 10.6. Plts 172 up. **PTA regimen 7mg  Tues/Thurs, 5mg  AODs  Goal of Therapy:  INR 2.5-3.5 Monitor platelets by anticoagulation protocol: Yes   Plan:  Hold warfarin again tonight. Daily INR   John Barton, PharmD, John Barton Clinical Staff Pharmacist John Barton 04/02/2019,9:03 AM

## 2019-04-02 NOTE — Progress Notes (Signed)
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 Jimmy Footman.   Patient presented to the ED on 10/9 with sudden onset of fever, chills and tremors associated with right-sided chest pain at home.  In the ED, patient had a temperature 100.2, blood pressure was initially normal and later trended down as low as 86/57.  Required low-flow oxygen supply to maintain O2 sat above 90%. Work-up in the ED showed n WBC count elevated to 21.4, hemoglobin 12.8, platelet 163, sodium 134, BUN/creatinine 35/1.64, INR therapeutic at 2.4, lactic acid level normal COVID-19 test negative. Urinalysis showed cloudy urine with WBC >50 and rare bacteria. Chest x-ray showed streaky airspace opacities in the left lung base possibly consolidation. CT abdomen and pelvis showed a 5.1 cm infrarenal abdominal aortic aneurysm. Also left lower quadrant renal transplant with mild perinephric stranding no hydronephrosis. 1.8 cm bladder calculus.  Patient initiated on IV antibiotics and is was admitted for further evaluation and management.   Over the course of last 3 days, patient has had unstable blood pressure down as low as 60s.  Because of compromised renal perfusion, he has had AKI with significant rise in creatinine which is still ongoing. He was transferred to ICU on 10/11, briefly required pressors and transfer out on 10/12.  10/15: Per nursing, pt threatening to leave AMA   Assessment & Plan:   Principal Problem:   Endocarditis Active Problems:   Hx of CABG   Kidney replaced by transplant   Sepsis secondary to UTI [facility])  Community acquired pneumonia   Sepsis with hypotension      - Met sepsis criteria on admission with leukocytosis, fever and potential source.       - Bld Cx did not show any growth.  UCx showed insignificant growth of less than 10,000 CFU per mL.       - TTE showed a possible oscillating density in the mitral valve. Scheduled for TEE but unable to do d/t elevated INR     - ID consultation appreciated.  Currently on IV Rocephin and IV daptomycin.      - BPs improved     - presumed source is endocarditis; however, INR is elevated so unable to do TEE right now; will give vit K today and see how well he reverses; coumadin is per pharmacy, currently held  Acute kidney injury on a transplant kidney Metabolic acidosis     - Renal transplant in 1998.  Follows up with Dr. Jimmy Footman.      - Baseline creatinine 1.3-1.4.       - Patient presented with creatinine elevated to 1.64, steadily worsening, peaked at 6.09; it is 4.39 today.     - Fluid balance +9.7 L. UOP ok.     - NaHCO3 1316m BID; Bicarb is 16     - Immunosuppression changed from CellCept to mycophenolate by nephrology.     - If no further improvement in creatinine, patient may end up requiring dialysis.     - nephrology onboard, appreciate assistance  Diarrhea     - resolved       -  Patient had few episodes of greenish fluids bowel movement in first 2 days of hospitalization which he states is because of brown-colored CellCept that he has been getting in the hospital.  Once the pill was switched, diarrhea stopped.     - denies diarrhea prior to admission   Type 2 diabetes mellitus     - A1c 6.6 in 2018     - glucose ok  Mechanical aortic valve Supratherapeutic INR     - On Coumadin, INR 2.4 on presentation.  Pharmacy to dose Coumadin to maintain INR between 2.5-3.     - INR 5.7 today     - urine is blood tinged     - will add OT dose vit K  CAD s/p CABG 1995 Elevated troponin     - Troponin was slightly elevated at 21 on  admission. Currently not complaining of chest pain.  Continue to monitor.     - Home meds include aspirin, statin; continue  Combined systolic and diastolic CHF  History of hypertension     - Echo 10/10, EF 45 to 50%, unchanged.     - Home meds include Lasix 40 mg daily, lisinopril 5 mg daily. lisinopril hold because of hypotension and AKI.     - currently on lasix 103m IV; monitor    Sinus bradycardia      - s/p loop recorder  BPH      - continue Flomax.  Add Vit K. Hopefully we can get his TEE done. He has decided to stay.  DVT prophylaxis: Supratherapeutic now Code Status: FULL   Disposition Plan: TBD  Consultants:   ID  Cardiology  Nephrolo  Antimicrobials:   Dapto, rocephin   ROS:  Denies CP, ab pain, dyspnea, N, V. Remainder 10-pt ROS is negative for all not previously mentioned.  Subjective: "Well no one told me all that."  Objective: Vitals:   04/02/19 0432 04/02/19 0827 04/02/19 0854 04/02/19 1152  BP: (!) 143/68 (!) 131/114 (!) 142/67 135/69  Pulse: 86 84 81 78  Resp: 18 12  14   Temp: 98.3 F (36.8 C) 98.3 F (36.8 C)  98.3 F (36.8 C)  TempSrc: Oral Oral  Oral  SpO2: 97% 96% 97% 97%  Weight:      Height:        Intake/Output Summary (Last 24 hours) at 04/02/2019 1514 Last data filed at 04/02/2019 1421 Gross per 24 hour  Intake 1444.05 ml  Output 3701 ml  Net -2256.95 ml   Filed Weights   03/30/19 0500 03/31/19 0404 04/02/19 0000  Weight: 93.4 kg 96.6 kg 95.6 kg    Examination:  General: 73y.o. male resting in chair in NAD Cardiovascular: RRR, +S1, S2, no m/g/r, equal pulses throughout Respiratory: CTABL, no w/r/r, normal WOB GI: BS+, NDNT, no masses noted, no organomegaly noted MSK: No e/c/c Neuro: Alert to name, follows commands Psyc: Appropriate interaction and affect, calm/cooperative   Data Reviewed: I have personally reviewed following labs and imaging studies.  CBC: Recent Labs  Lab 04/02/2019 1822  03/29/19 0703  03/29/19 1950 03/30/19 0216 04/01/19 0534 04/02/19 0716  WBC 21.4*   < > 24.0* 22.1* 20.9* 11.7* 10.2  NEUTROABS 20.1*  --  22.3*  --   --  10.8* 9.5*  HGB 12.8*   < > 10.2* 11.7* 10.9* 10.3* 10.6*  HCT 38.8*   < > 33.8* 39.3 35.1* 33.2* 32.9*  MCV 87.8   < > 93.6 95.6 92.4 90.7 88.9  PLT 163   < >  126* 127* 121* 150 172   < > = values in this interval not displayed.   Basic Metabolic Panel: Recent Labs  Lab 03/29/19 0703 03/29/19 1950 03/30/19 0216 03/31/19 0450 04/01/19 0534 04/02/19 0716  NA 135 134* 134* 134* 137   136 138  K 4.3 4.4 4.2 4.1 4.0   4.0 3.5  CL 106 106 105 104 109   108 109  CO2 14* 14* 15* 15* 14*   14* 16*  GLUCOSE 139* 151* 161* 151* 119*   120* 126*  BUN 62* 66* 69* 80* 86*   86* 82*  CREATININE 4.31* 4.88* 5.13* 6.09* 6.00*   6.02* 4.39*  CALCIUM 7.0* 7.1* 7.0* 6.9* 6.9*   6.9* 7.0*  MG 1.4*  --  2.2  --  2.2  --   PHOS  --   --   --  3.6 3.7   3.7 3.0   GFR: Estimated Creatinine Clearance: 17 mL/min (A) (by C-G formula based on SCr of 4.39 mg/dL (H)). Liver Function Tests: Recent Labs  Lab 04/10/2019 1822 03/28/19 0220 03/31/19 0450 04/01/19 0534 04/02/19 0716  AST 23 28  --   --   --   ALT 19 19  --   --   --   ALKPHOS 85 78  --   --   --   BILITOT 1.1 1.3*  --   --   --   PROT 7.1 6.5  --   --   --   ALBUMIN 4.3 3.9 3.0* 2.8* 2.8*   No results for input(s): LIPASE, AMYLASE in the last 168 hours. No results for input(s): AMMONIA in the last 168 hours. Coagulation Profile: Recent Labs  Lab 03/29/19 0703 03/30/19 0216 03/31/19 0450 04/01/19 0534 04/02/19 0716  INR 2.3* 2.8* 4.5* 6.6* 5.7*   Cardiac Enzymes: Recent Labs  Lab 04/01/19 0534  CKTOTAL 289   BNP (last 3 results) No results for input(s): PROBNP in the last 8760 hours. HbA1C: No results for input(s): HGBA1C in the last 72 hours. CBG: Recent Labs  Lab 03/28/19 1640 03/28/19 2134 03/29/19 0649 03/29/19 1206 03/29/19 1854  GLUCAP 150* 125* 143* 162* 133*    Lipid Profile: No results for input(s): CHOL, HDL, LDLCALC, TRIG, CHOLHDL, LDLDIRECT in the last 72 hours. Thyroid Function Tests: No results for input(s): TSH, T4TOTAL, FREET4, T3FREE, THYROIDAB in the last 72 hours. Anemia Panel: No results for input(s): VITAMINB12, FOLATE, FERRITIN, TIBC, IRON, RETICCTPCT in the last 72 hours. Sepsis Labs: Recent Labs  Lab 03/19/2019 1822 03/28/19 0220 03/29/19 1950 03/30/19 0932  PROCALCITON 0.18  --   --  28.50  LATICACIDVEN 0.9 1.6 0.9  --     Recent Results (from the past 240 hour(s))  SARS CORONAVIRUS 2 (TAT 6-24 HRS) Nasopharyngeal Nasopharyngeal Swab     Status: None   Collection Time: 04/17/2019  6:23 PM   Specimen: Nasopharyngeal Swab  Result Value Ref Range Status   SARS Coronavirus 2 NEGATIVE NEGATIVE Final    Comment: (NOTE) SARS-CoV-2 target nucleic acids are NOT DETECTED. The SARS-CoV-2 RNA is generally detectable in upper and lower respiratory specimens during the acute phase of infection. Negative results do not preclude SARS-CoV-2 infection, do not rule out co-infections with other pathogens, and should not be used as the sole basis for treatment or other patient management decisions. Negative results must be combined with clinical observations, patient history, and epidemiological information. The expected result is Negative. Fact Sheet for Patients: SugarRoll.be Fact Sheet for Healthcare Providers:  https://www.woods-mathews.com/ This test is not yet approved or cleared by the [place] and  has been authorized for detection and/or diagnosis of SARS-CoV-2 by FDA under an Emergency Use Authorization (EUA). This EUA will remain  in effect (meaning this test can be used) for the duration of the COVID-19 declaration under Section 56 4(b)(1) of the Act, 21 U.S.C. section 360bbb-3(b)(1), unless the authorization is terminated or revoked sooner. Performed at [facility], [place] [address] [place], [place] [address]   Culture, blood (routine x 2)     Status: None   Collection Time: 04/02/2019  6:47 PM   Specimen: BLOOD RIGHT ARM  Result Value Ref Range Status   Specimen Description BLOOD RIGHT ARM  Final   Special Requests   Final    BOTTLES DRAWN AEROBIC AND ANAEROBIC Blood Culture adequate volume   Culture   Final    NO GROWTH 6 DAYS Performed at [facility], [place] [address], [place], [place] [address]    Report Status 04/02/2019 FINAL  Final  Culture, blood (routine x 2)     Status: None   Collection Time: 03/28/2019  7:00 PM   Specimen: BLOOD RIGHT WRIST  Result Value Ref Range Status   Specimen Description BLOOD RIGHT WRIST  Final   Special Requests   Final    BOTTLES DRAWN AEROBIC AND ANAEROBIC Blood Culture results may not be optimal due to an excessive volume of blood received in culture bottles   Culture   Final    NO GROWTH 6 DAYS Performed at [facility], [place] [address], [place], [place] [address]    Report Status 04/02/2019 FINAL  Final  Urine culture     Status: Abnormal   Collection Time: 03/19/2019 11:15 PM   Specimen: Urine, Random  Result Value Ref Range Status   Specimen Description URINE, RANDOM  Final   Special Requests NONE  Final   Culture (A)  Final    <10,000 COLONIES/mL INSIGNIFICANT GROWTH Performed at [facility], [place] [address] [place], [place] [address]    Report Status 03/29/2019 FINAL  Final  MRSA PCR Screening     Status: None   Collection Time: 03/28/19  8:34 AM   Specimen: Nasal Mucosa; Nasopharyngeal  Result Value Ref Range Status   MRSA by PCR NEGATIVE NEGATIVE Final    Comment:        The GeneXpert MRSA Assay (FDA approved for NASAL specimens only), is one component of a comprehensive MRSA colonization surveillance program. It is not intended to diagnose MRSA infection nor to guide or monitor treatment for MRSA infections. Performed at [facility], [place] [address]  [place], [place] [address]   C difficile quick scan w PCR reflex     Status: None   Collection Time: 03/29/19  2:47 AM   Specimen: STOOL  Result Value Ref Range Status   C Diff antigen NEGATIVE NEGATIVE Final   C Diff toxin NEGATIVE NEGATIVE Final   C Diff interpretation No C. difficile detected.  Final    Comment: Performed at [facility], [place] [address], [place], [place] [address]  GI pathogen panel by PCR, stool     Status: None   Collection Time: 03/29/19  8:17 AM   Specimen: Stool  Result Value Ref Range Status   Plesiomonas shigelloides NOT PERFORMED  Final    Comment: Test not performed   Yersinia enterocolitica NOT PERFORMED  Final  Comment: Test not performed   Vibrio NOT PERFORMED  Final    Comment: Test not performed   Enteropathogenic E coli NOT PERFORMED  Final    Comment: Test not performed   E coli (ETEC) LT/ST NOT PERFORMED  Final    Comment: Test not performed   E coli 0157 by PCR NOT PERFORMED  Final    Comment: Test not performed   Cryptosporidium by PCR NOT PERFORMED  Final    Comment: Test not performed   Entamoeba histolytica NOT PERFORMED  Final    Comment: Test not performed   Adenovirus F 40/41 NOT PERFORMED  Final    Comment: Test not performed   Norovirus GI/GII NOT PERFORMED  Final    Comment: Test not performed   Sapovirus NOT PERFORMED  Final    Comment: (NOTE) Test not performed Performed At: [facility] [place], [place] [id number] Rush Farmer MD [id number]    Vibrio cholerae NOT PERFORMED  Final    Comment: Test not performed   Campylobacter by PCR TIR4  Final    Comment: (NOTE) The specimen submitted does not meet the laboratory's criteria for acceptability. Refer to [employer] [facility] Services for specimen acceptability criteria.      Required: ParaPak Orange      Received: Sterile Container      Mayra Neer was notified 03/30/2019.    Salmonella by PCR NOT PERFORMED  Final    Comment: Test not  performed   E coli (STEC) NOT PERFORMED  Final    Comment: Test not performed   Enteroaggregative E coli NOT PERFORMED  Final    Comment: Test not performed   Shigella by PCR NOT PERFORMED  Final    Comment: Test not performed   Cyclospora cayetanensis NOT PERFORMED  Final    Comment: Test not performed   Astrovirus NOT PERFORMED  Final    Comment: Test not performed   G lamblia by PCR NOT PERFORMED  Final    Comment: Test not performed   Rotavirus A by PCR NOT PERFORMED  Final    Comment: Test not performed CALLED TO L.KING,RN AT 1732 03/30/19 BY L.PITT  Performed at [facility], [place] [address], [place], [place] [address]       Radiology Studies: No results found.   Scheduled Meds:  aspirin EC  81 mg Oral Daily   atorvastatin  80 mg Oral Daily   calcitRIOL  0.5 mcg Oral Once per day on Tue Thu Sat   And   calcitRIOL  1 mcg Oral Once per day on Sun Mon Wed Fri   Chlorhexidine Gluconate Cloth  6 each Topical Daily   furosemide  40 mg Intravenous Daily   influenza vaccine adjuvanted  0.5 mL Intramuscular Tomorrow-1000   mouth rinse  15 mL Mouth Rinse BID   mycophenolate  720 mg Oral BID   pneumococcal 23 valent vaccine  0.5 mL Intramuscular Tomorrow-1000   sodium bicarbonate  1,300 mg Oral BID   tamsulosin  0.4 mg Oral Daily   Warfarin - Pharmacist Dosing Inpatient   Does not apply q1800   Continuous Infusions:  sodium chloride 1,000 mL (03/31/19 1524)   sodium chloride Stopped (04/01/19 1257)   cefTRIAXone (ROCEPHIN)  IV 2 g (04/02/19 1016)   DAPTOmycin (CUBICIN)  IV 800 mg (03/31/19 1524)     LOS: 6 days    Time spent: 25 minutes spent in the coordination of care today.  Jonnie Finner, DO [facility] Pager [phone]  If 7PM-7AM, please contact night-coverage [email or web address] Password [email or web address] 04/02/2019, 3:14 PM

## 2019-04-02 NOTE — Plan of Care (Signed)

## 2019-04-02 NOTE — Progress Notes (Signed)
Patient ID: John Barton, male   DOB: May 05, 1946, 73 y.o.   MRN: [id number] S: John Barton is very upset that his TEE was cancelled yesterday and no one told him. O:BP (!) 142/67 (BP Location: Right Arm)   Pulse 81   Temp 98.3 F (36.8 C) (Oral)   Resp 12   Ht 5' 10.5" (1.791 m)   Wt 95.6 kg Comment: scale c  SpO2 97%   BMI 29.82 kg/m   Intake/Output Summary (Last 24 hours) at 04/02/2019 1026 Last data filed at 04/02/2019 0434 Gross per 24 hour  Intake 1621.05 ml  Output 2101 ml  Net -479.95 ml   Intake/Output: I/O last 3 completed shifts: In: 1861.1 [P.O.:1312; I.V.:419; IV Piggyback:130] Out: 2326 [Urine:2325; Stool:1]  Intake/Output this shift:  No intake/output data recorded. Weight change:  Gen: NAD CVS: no rub Resp: CTA Abd: +BS, soft, NT Ext: 1+ pitting edema of lower ext GU- +hematuria in foley bag  Recent Labs  Lab 04/08/2019 1822 03/28/19 0220 03/29/19 0703 03/29/19 1950 03/30/19 0216 03/31/19 0450 04/01/19 0534 04/02/19 0716  NA 134* 136 135 134* 134* 134* 137  136 138  K 4.1 4.3 4.3 4.4 4.2 4.1 4.0  4.0 3.5  CL 99 100 106 106 105 104 109  108 109  CO2 22 22 14* 14* 15* 15* 14*  14* 16*  GLUCOSE 136* 157* 139* 151* 161* 151* 119*  120* 126*  BUN 35* 41* 62* 66* 69* 80* 86*  86* 82*  CREATININE 1.64* 2.32* 4.31* 4.88* 5.13* 6.09* 6.00*  6.02* 4.39*  ALBUMIN 4.3 3.9  --   --   --  3.0* 2.8* 2.8*  CALCIUM 9.1 8.8* 7.0* 7.1* 7.0* 6.9* 6.9*  6.9* 7.0*  PHOS  --   --   --   --   --  3.6 3.7  3.7 3.0  AST 23 28  --   --   --   --   --   --   ALT 19 19  --   --   --   --   --   --    Liver Function Tests: Recent Labs  Lab 04/13/2019 1822 03/28/19 0220 03/31/19 0450 04/01/19 0534 04/02/19 0716  AST 23 28  --   --   --   ALT 19 19  --   --   --   ALKPHOS 85 78  --   --   --   BILITOT 1.1 1.3*  --   --   --   PROT 7.1 6.5  --   --   --   ALBUMIN 4.3 3.9 3.0* 2.8* 2.8*   No results for input(s): LIPASE, AMYLASE in the last 168 hours. No  results for input(s): AMMONIA in the last 168 hours. CBC: Recent Labs  Lab 03/29/19 0703 03/29/19 1950 03/30/19 0216 04/01/19 0534 04/02/19 0716  WBC 24.0* 22.1* 20.9* 11.7* 10.2  NEUTROABS 22.3*  --   --  10.8* 9.5*  HGB 10.2* 11.7* 10.9* 10.3* 10.6*  HCT 33.8* 39.3 35.1* 33.2* 32.9*  MCV 93.6 95.6 92.4 90.7 88.9  PLT 126* 127* 121* 150 172   Cardiac Enzymes: Recent Labs  Lab 04/01/19 0534  CKTOTAL 289   CBG: Recent Labs  Lab 03/28/19 1640 03/28/19 2134 03/29/19 0649 03/29/19 1206 03/29/19 1854  GLUCAP 150* 125* 143* 162* 133*    Iron Studies: No results for input(s): IRON, TIBC, TRANSFERRIN, FERRITIN in the last 72 hours. Studies/Results: No results found. Marland Kitchen aspirin  EC  81 mg Oral Daily  . atorvastatin  80 mg Oral Daily  . calcitRIOL  0.5 mcg Oral Once per day on Tue Thu Sat   And  . calcitRIOL  1 mcg Oral Once per day on Sun Mon Wed Fri  . Chlorhexidine Gluconate Cloth  6 each Topical Daily  . furosemide  40 mg Intravenous Daily  . influenza vaccine adjuvanted  0.5 mL Intramuscular Tomorrow-1000  . mouth rinse  15 mL Mouth Rinse BID  . mycophenolate  720 mg Oral BID  . pneumococcal 23 valent vaccine  0.5 mL Intramuscular Tomorrow-1000  . sodium bicarbonate  1,300 mg Oral BID  . tamsulosin  0.4 mg Oral Daily  . Warfarin - Pharmacist Dosing Inpatient   Does not apply q1800    BMET    Component Value Date/Time   NA 138 04/02/2019 0716   NA 136 (A) 12/31/2018   K 3.5 04/02/2019 0716   K 5.3 11/17/2013   CL 109 04/02/2019 0716   CL 100 11/17/2013   CO2 16 (L) 04/02/2019 0716   GLUCOSE 126 (H) 04/02/2019 0716   BUN 82 (H) 04/02/2019 0716   BUN 22 (A) 12/31/2018   CREATININE 4.39 (H) 04/02/2019 0716   CREATININE 1.34 11/17/2013   CALCIUM 7.0 (L) 04/02/2019 0716   CALCIUM 10.0 11/17/2013   GFRNONAA 12 (L) 04/02/2019 0716   GFRNONAA 54 11/17/2013   GFRAA 14 (L) 04/02/2019 0716   GFRAA 62 11/17/2013   CBC    Component Value Date/Time   WBC 10.2  04/02/2019 0716   RBC 3.70 (L) 04/02/2019 0716   HGB 10.6 (L) 04/02/2019 0716   HGB 12.9 (L) 08/15/2017 0949   HCT 32.9 (L) 04/02/2019 0716   HCT 37.9 08/15/2017 0949   HCT 44 11/17/2013   PLT 172 04/02/2019 0716   PLT 206 08/15/2017 0949   MCV 88.9 04/02/2019 0716   MCV 86 08/15/2017 0949   MCH 28.6 04/02/2019 0716   MCHC 32.2 04/02/2019 0716   RDW 14.6 04/02/2019 0716   RDW 14.7 08/15/2017 0949   RDW 14.2 11/17/2013   LYMPHSABS 0.4 (L) 04/02/2019 0716   MONOABS 0.3 04/02/2019 0716   EOSABS 0.0 04/02/2019 0716   EOSABS 0 11/17/2013   BASOSABS 0.0 04/02/2019 0716   BASOSABS 1 11/17/2013    Assessment/Plan:  1. AKI/CKD stage 3 presumably due to ischemic ATN in setting of volume depletion and hypotension. Remains oliguric despite IVF's, but UOP has picked up to 375 today. No indication for dialysis but will need to continue to monitor closely 1. Restarted lasix 03/31/19 due to edema  2. Cr seems to have reached a plateau and now improving to 4.39. 3. No indication for HD at this time 4. Continue to follow. 2. Sepsis with hypotension- blood cultures negative to date, TTE with oscillating density in the mitral valve and awaiting TEE but INR too high yesterday.  Currently on dapto and ceftriaxone per ID. 3. Diarrhea- possibly due to cellcept but with leukocytosis and hypotension,concern forinfectious etiology, however gi pathogen panel not performed for unclear reasons. Will change to myfortic. 4. Metabolic acidosis- bicarb and follow 5. Hematuria- may be due to foley.  Will d/c foley and follow. 6. 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  John Potts, MD John Barton [phone]

## 2019-04-02 NOTE — Progress Notes (Signed)
[facility]  Date of Admission:  04/10/2019     Total days of antibiotics 7         ASSESSMENT:  John Barton was unfortunately unable to complete his TEE yesterday due to elevated INR levels. He is frustrated today and wanting to go home as he has had no fever and feels he has been loaded with fluid. Discussed importance of completing TEE to rule out endocarditis especially in the setting of a prosthetic valve. Repeat cultures finalized without growth. Source of infection remains unclear. Duration of therapy pending TEE results. 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 Acute kidney injury appears to be resolving. Pharmacy to discuss Vitamin K for reversal of previous coumadin and lower INR.   PLAN:  1. Continue Daptomycin and ceftriaxone. 2. Monitor CK levels while on Daptomycin 3. Will likely need IR placed central line due to acute kidney injury 4. Await results of TEE.   Principal Problem:   Endocarditis Active Problems:   Hx of CABG   Kidney replaced by transplant   Sepsis secondary to UTI [facility])   Community acquired pneumonia   . aspirin EC  81 mg Oral Daily  . atorvastatin  80 mg Oral Daily  . calcitRIOL  0.5 mcg Oral Once per day on Tue Thu Sat   And  . calcitRIOL  1 mcg Oral Once per day on Sun Mon Wed Fri  . Chlorhexidine Gluconate Cloth  6 each Topical Daily  . furosemide  40 mg Intravenous Daily  . influenza vaccine adjuvanted  0.5 mL Intramuscular Tomorrow-1000  . mouth rinse  15 mL Mouth Rinse BID  . mycophenolate  720 mg Oral BID  . pneumococcal 23 valent vaccine  0.5 mL Intramuscular Tomorrow-1000  . sodium bicarbonate  1,300 mg Oral BID  . tamsulosin  0.4 mg Oral Daily  . Warfarin - Pharmacist Dosing Inpatient   Does not apply q1800    SUBJECTIVE:  Afebrile  overnight with continued improvement of leukocytosis with most recent white blood cell count of 10.2.  John Barton is frustrated today due to communication regarding his transesophageal echocardiogram.  He is wanting to go home.  Allergies  Allergen Reactions  . Vancomycin     Acute renal failure and h/o renal transplant. DO NOT GIVE!     Review of Systems: Review of Systems  Constitutional: Negative for chills, fever and weight loss.  Respiratory: Negative for cough, shortness of breath and wheezing.   Cardiovascular: Negative for chest pain and leg swelling.  Gastrointestinal: Negative for abdominal pain, constipation, diarrhea, nausea and vomiting.  Skin: Negative for rash.      OBJECTIVE: Vitals:   04/02/19 0000 04/02/19 0432 04/02/19 0827 04/02/19 0854  BP:  (!) 143/68 (!) 131/114 (!) 142/67  Pulse:  86 84 81  Resp:  18 12   Temp:  98.3 F (36.8 C) 98.3 F (36.8 C)   TempSrc:  Oral Oral   SpO2:  97% 96% 97%  Weight: 95.6 kg     Height:       Body mass index is 29.82 kg/m.  Physical Exam Constitutional:      General: He is not in acute distress.    Appearance: He is well-developed.     Comments: Seated in the chair; frustrated/cantankerous.  Cardiovascular:  Rate and Rhythm: Normal rate and regular rhythm.     Heart sounds: Normal heart sounds.  Pulmonary:     Effort: Pulmonary effort is normal.     Breath sounds: Normal breath sounds. No wheezing or rhonchi.  Skin:    General: Skin is warm and dry.  Neurological:     Mental Status: He is alert.     Lab Results Lab Results  Component Value Date   WBC 10.2 04/02/2019   HGB 10.6 (L) 04/02/2019   HCT 32.9 (L) 04/02/2019   MCV 88.9 04/02/2019   PLT 172 04/02/2019    Lab Results  Component Value Date   CREATININE 4.39 (H) 04/02/2019   BUN 82 (H) 04/02/2019   NA 138 04/02/2019   K 3.5 04/02/2019   CL 109 04/02/2019   CO2 16 (L) 04/02/2019    Lab Results  Component Value Date   ALT 19  03/28/2019   AST 28 03/28/2019   ALKPHOS 78 03/28/2019   BILITOT 1.3 (H) 03/28/2019     Microbiology: Recent Results (from the past 240 hour(s))  SARS CORONAVIRUS 2 (TAT 6-24 HRS) Nasopharyngeal Nasopharyngeal Swab     Status: None   Collection Time: 04/17/2019  6:23 PM   Specimen: Nasopharyngeal Swab  Result Value Ref Range Status   SARS Coronavirus 2 NEGATIVE NEGATIVE Final    Comment: (NOTE) SARS-CoV-2 target nucleic acids are NOT DETECTED. The SARS-CoV-2 RNA is generally detectable in upper and lower respiratory specimens during the acute phase of infection. Negative results do not preclude SARS-CoV-2 infection, do not rule out co-infections with other pathogens, and should not be used as the sole basis for treatment or other patient management decisions. Negative results must be combined with clinical observations, patient history, and epidemiological information. The expected result is Negative. Fact Sheet for Patients: SugarRoll.be Fact Sheet for Healthcare Providers: https://www.woods-mathews.com/ This test is not yet approved or cleared by the [place] FDA and  has been authorized for detection and/or diagnosis of SARS-CoV-2 by FDA under an Emergency Use Authorization (EUA). This EUA will remain  in effect (meaning this test can be used) for the duration of the COVID-19 declaration under Section 56 4(b)(1) of the Act, 21 U.S.C. section 360bbb-3(b)(1), unless the authorization is terminated or revoked sooner. Performed at [facility], [place] [address], [place], [place] [address]   Culture, blood (routine x 2)     Status: None   Collection Time: 04/18/2019  6:47 PM   Specimen: BLOOD RIGHT ARM  Result Value Ref Range Status   Specimen Description BLOOD RIGHT ARM  Final   Special Requests   Final    BOTTLES DRAWN AEROBIC AND ANAEROBIC Blood Culture adequate volume   Culture   Final    NO GROWTH 6 DAYS Performed at [facility], [place] [address], [place], [place] [address]    Report Status 04/02/2019 FINAL  Final  Culture, blood (routine x 2)     Status: None   Collection Time: 03/26/2019  7:00 PM   Specimen: BLOOD RIGHT WRIST  Result Value Ref Range Status   Specimen Description BLOOD RIGHT WRIST  Final   Special Requests   Final    BOTTLES DRAWN AEROBIC AND ANAEROBIC Blood Culture results may not be optimal due to an excessive volume of blood received in culture bottles   Culture   Final    NO GROWTH 6 DAYS Performed at [facility], [place] [address], [place], [place]  [id number]    Report Status 04/02/2019 FINAL  Final  Urine culture     Status: Abnormal   Collection Time: 04/06/2019 11:15 PM   Specimen: Urine, Random  Result Value Ref Range Status   Specimen Description URINE, RANDOM  Final   Special Requests NONE  Final   Culture (A)  Final    <10,000 COLONIES/mL INSIGNIFICANT GROWTH Performed at [facility], [place] [address] [place], [place] [address]    Report Status 03/29/2019 FINAL  Final  MRSA PCR Screening     Status: None   Collection Time: 03/28/19  8:34 AM   Specimen: Nasal Mucosa; Nasopharyngeal  Result Value Ref Range Status   MRSA by PCR NEGATIVE NEGATIVE Final    Comment:        The GeneXpert MRSA Assay (FDA approved for NASAL specimens only), is one component of a comprehensive MRSA colonization surveillance program. It is not intended to diagnose MRSA infection nor to guide or monitor treatment for MRSA infections. Performed at [facility], [place] [address] [place], [place] [address]   C difficile quick scan w PCR reflex     Status: None   Collection Time: 03/29/19  2:47 AM   Specimen: STOOL  Result Value Ref Range Status   C Diff antigen NEGATIVE NEGATIVE Final   C Diff toxin NEGATIVE NEGATIVE Final   C Diff interpretation No C. difficile detected.  Final    Comment: Performed at [facility], [place] [address], [place], [place] [address]  GI  pathogen panel by PCR, stool     Status: None   Collection Time: 03/29/19  8:17 AM   Specimen: Stool  Result Value Ref Range Status   Plesiomonas shigelloides NOT PERFORMED  Final    Comment: Test not performed   Yersinia enterocolitica NOT PERFORMED  Final    Comment: Test not performed   Vibrio NOT PERFORMED  Final    Comment: Test not performed   Enteropathogenic E coli NOT PERFORMED  Final    Comment: Test not performed   E coli (ETEC) LT/ST NOT PERFORMED  Final    Comment: Test not performed   E coli 0157 by PCR NOT PERFORMED  Final    Comment: Test not performed   Cryptosporidium by PCR NOT PERFORMED  Final    Comment: Test not performed   Entamoeba histolytica NOT PERFORMED  Final    Comment: Test not performed   Adenovirus F 40/41 NOT PERFORMED  Final    Comment: Test not performed   Norovirus GI/GII NOT PERFORMED  Final    Comment: Test not performed   Sapovirus NOT PERFORMED  Final    Comment: (NOTE) Test not performed Performed At: [facility] [place], [place] [id number] Rush Farmer MD [id number]    Vibrio cholerae NOT PERFORMED  Final    Comment: Test not performed   Campylobacter by PCR [id number]  Final    Comment: (NOTE) The specimen submitted does not meet the laboratory's criteria for acceptability. Refer to [employer] [facility] for specimen acceptability criteria.      Required: ParaPak Orange      Received: Sterile Container      Mayra Neer was notified 03/30/2019.    Salmonella by PCR NOT PERFORMED  Final    Comment: Test not performed   E coli (STEC) NOT PERFORMED  Final    Comment: Test not performed   Enteroaggregative E coli NOT PERFORMED  Final  Comment: Test not performed   Shigella by PCR NOT PERFORMED  Final    Comment: Test not performed   Cyclospora cayetanensis NOT PERFORMED  Final    Comment: Test not performed   Astrovirus NOT PERFORMED  Final    Comment: Test not performed   G lamblia by PCR  NOT PERFORMED  Final    Comment: Test not performed   Rotavirus A by PCR NOT PERFORMED  Final    Comment: Test not performed CALLED TO L.KING,RN AT 1732 03/30/19 BY L.PITT  Performed at [facility], [place] [address], [place], [place] [address]      Terri Piedra, [place] [facility] [phone] Pager  04/02/2019  10:35 AM

## 2019-04-03 ENCOUNTER — Encounter (HOSPITAL_COMMUNITY): Payer: Self-pay | Admitting: Certified Registered Nurse Anesthetist

## 2019-04-03 ENCOUNTER — Encounter (HOSPITAL_COMMUNITY): Payer: Self-pay | Admitting: Emergency Medicine

## 2019-04-03 ENCOUNTER — Encounter (HOSPITAL_COMMUNITY): Admission: EM | Disposition: E | Payer: Self-pay | Source: Home / Self Care | Attending: Internal Medicine

## 2019-04-03 ENCOUNTER — Other Ambulatory Visit (HOSPITAL_COMMUNITY): Payer: Medicare Other

## 2019-04-03 ENCOUNTER — Inpatient Hospital Stay (HOSPITAL_COMMUNITY): Payer: Medicare Other

## 2019-04-03 DIAGNOSIS — R7881 Bacteremia: Secondary | ICD-10-CM

## 2019-04-03 DIAGNOSIS — N179 Acute kidney failure, unspecified: Secondary | ICD-10-CM

## 2019-04-03 DIAGNOSIS — R509 Fever, unspecified: Secondary | ICD-10-CM

## 2019-04-03 DIAGNOSIS — M7989 Other specified soft tissue disorders: Secondary | ICD-10-CM

## 2019-04-03 DIAGNOSIS — I340 Nonrheumatic mitral (valve) insufficiency: Secondary | ICD-10-CM

## 2019-04-03 HISTORY — PX: TEE WITHOUT CARDIOVERSION: SHX5443

## 2019-04-03 LAB — PROTIME-INR
INR: 1.4 — ABNORMAL HIGH (ref 0.8–1.2)
Prothrombin Time: 17.1 seconds — ABNORMAL HIGH (ref 11.4–15.2)

## 2019-04-03 LAB — CBC WITH DIFFERENTIAL/PLATELET
Abs Immature Granulocytes: 0 10*3/uL (ref 0.00–0.07)
Basophils Absolute: 0.1 10*3/uL (ref 0.0–0.1)
Basophils Relative: 1 %
Eosinophils Absolute: 0.4 10*3/uL (ref 0.0–0.5)
Eosinophils Relative: 4 %
HCT: 32.3 % — ABNORMAL LOW (ref 39.0–52.0)
Hemoglobin: 10.7 g/dL — ABNORMAL LOW (ref 13.0–17.0)
Lymphocytes Relative: 9 %
Lymphs Abs: 0.9 10*3/uL (ref 0.7–4.0)
MCH: 29.1 pg (ref 26.0–34.0)
MCHC: 33.1 g/dL (ref 30.0–36.0)
MCV: 87.8 fL (ref 80.0–100.0)
Monocytes Absolute: 0.4 10*3/uL (ref 0.1–1.0)
Monocytes Relative: 4 %
Neutro Abs: 8.5 10*3/uL — ABNORMAL HIGH (ref 1.7–7.7)
Neutrophils Relative %: 82 %
Platelets: 186 10*3/uL (ref 150–400)
RBC: 3.68 MIL/uL — ABNORMAL LOW (ref 4.22–5.81)
RDW: 14.4 % (ref 11.5–15.5)
WBC: 10.4 10*3/uL (ref 4.0–10.5)
nRBC: 0 % (ref 0.0–0.2)
nRBC: 0 /100 WBC

## 2019-04-03 LAB — RENAL FUNCTION PANEL
Albumin: 2.9 g/dL — ABNORMAL LOW (ref 3.5–5.0)
Anion gap: 8 (ref 5–15)
BUN: 61 mg/dL — ABNORMAL HIGH (ref 8–23)
CO2: 21 mmol/L — ABNORMAL LOW (ref 22–32)
Calcium: 7.1 mg/dL — ABNORMAL LOW (ref 8.9–10.3)
Chloride: 111 mmol/L (ref 98–111)
Creatinine, Ser: 3.2 mg/dL — ABNORMAL HIGH (ref 0.61–1.24)
GFR calc Af Amer: 21 mL/min — ABNORMAL LOW (ref >60)
GFR calc non Af Amer: 18 mL/min — ABNORMAL LOW (ref >60)
Glucose, Bld: 130 mg/dL — ABNORMAL HIGH (ref 70–99)
Phosphorus: 2.8 mg/dL (ref 2.5–4.6)
Potassium: 3.4 mmol/L — ABNORMAL LOW (ref 3.5–5.1)
Sodium: 140 mmol/L (ref 135–145)

## 2019-04-03 LAB — HEPARIN LEVEL (UNFRACTIONATED): Heparin Unfractionated: 0.27 IU/mL — ABNORMAL LOW (ref 0.30–0.70)

## 2019-04-03 LAB — MAGNESIUM: Magnesium: 2.1 mg/dL (ref 1.7–2.4)

## 2019-04-03 LAB — CK: Total CK: 188 U/L (ref 49–397)

## 2019-04-03 SURGERY — ECHOCARDIOGRAM, TRANSESOPHAGEAL
Anesthesia: Moderate Sedation

## 2019-04-03 MED ORDER — MIDAZOLAM HCL (PF) 5 MG/ML IJ SOLN
INTRAMUSCULAR | Status: DC | PRN
  Administered 2019-04-03 (×2): 2 mg via INTRAVENOUS

## 2019-04-03 MED ORDER — FENTANYL CITRATE (PF) 100 MCG/2ML IJ SOLN
INTRAMUSCULAR | Status: DC | PRN
  Administered 2019-04-03 (×2): 25 ug via INTRAVENOUS

## 2019-04-03 MED ORDER — MIDAZOLAM HCL (PF) 5 MG/ML IJ SOLN
INTRAMUSCULAR | Status: AC
  Filled 2019-04-03: qty 2

## 2019-04-03 MED ORDER — WARFARIN SODIUM 5 MG PO TABS
5.0000 mg | ORAL_TABLET | Freq: Once | ORAL | Status: AC
  Administered 2019-04-03: 5 mg via ORAL
  Filled 2019-04-03: qty 1

## 2019-04-03 MED ORDER — HEPARIN (PORCINE) 25000 UT/250ML-% IV SOLN
1350.0000 [IU]/h | INTRAVENOUS | Status: DC
  Administered 2019-04-03: 1300 [IU]/h via INTRAVENOUS
  Administered 2019-04-04 (×2): 1500 [IU]/h via INTRAVENOUS
  Administered 2019-04-05: 1350 [IU]/h via INTRAVENOUS
  Filled 2019-04-03 (×5): qty 250

## 2019-04-03 MED ORDER — FENTANYL CITRATE (PF) 100 MCG/2ML IJ SOLN
INTRAMUSCULAR | Status: AC
  Filled 2019-04-03: qty 4

## 2019-04-03 MED ORDER — HEPARIN BOLUS VIA INFUSION
4000.0000 [IU] | Freq: Once | INTRAVENOUS | Status: AC
  Administered 2019-04-03: 4000 [IU] via INTRAVENOUS
  Filled 2019-04-03: qty 4000

## 2019-04-03 NOTE — Plan of Care (Signed)
Patient did not want his foley care to be done at 1000 this morning, he said that it has been done around 0430 this morning. Patient is educated about the importance of doing foley care to prevent him from having an infection. He is agreeable to let us do it later on during the day.

## 2019-04-03 NOTE — Progress Notes (Signed)
[facility]  Date of Admission:  03/20/2019     Total days of antibiotics 8         ASSESSMENT:  John Barton is awaiting TEE which previously was rescheduled due to elevated INR which has now been corrected. 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 Cultures remain negative and will continue with Daptomycin and ceftriaxone pending the TEE results. Will need PICC / Central line placement for prolonged therapy with duration to be determined. Acute kidney injury continues to improve.   PLAN:  1. Continue Daptomycin and ceftriaxone. 2. Await TEE results. 3. Will need central line / PICC.  Principal Problem:   Endocarditis Active Problems:   Hx of CABG   Kidney replaced by transplant   Sepsis secondary to UTI [facility])   Community acquired pneumonia   . aspirin EC  81 mg Oral Daily  . atorvastatin  80 mg Oral Daily  . calcitRIOL  0.5 mcg Oral Once per day on Tue Thu Sat   And  . calcitRIOL  1 mcg Oral Once per day on Sun Mon Wed Fri  . Chlorhexidine Gluconate Cloth  6 each Topical Daily  . furosemide  40 mg Intravenous Daily  . influenza vaccine adjuvanted  0.5 mL Intramuscular Tomorrow-1000  . mouth rinse  15 mL Mouth Rinse BID  . mycophenolate  720 mg Oral BID  . pneumococcal 23 valent vaccine  0.5 mL Intramuscular Tomorrow-1000  . sodium bicarbonate  1,300 mg Oral BID  . tamsulosin  0.4 mg Oral Daily  . warfarin  5 mg Oral ONCE-1800  . Warfarin - Pharmacist Dosing Inpatient   Does not apply q1800    SUBJECTIVE:  Afebrile overnight with no acute concerns/complaints.  INR now 1.4 following vitamin K dosing.  Scheduled for TEE today at 4:00.  Feeling okay today and apologetic for frustration yesterday.  Denies any fevers, chills, sweats, or pain at present.  Allergies  Allergen Reactions  . Vancomycin     Acute renal failure and h/o renal transplant. DO NOT GIVE!     Review of Systems: Review  of Systems  Constitutional: Negative for chills, fever and weight loss.  Respiratory: Negative for cough, shortness of breath and wheezing.   Cardiovascular: Negative for chest pain and leg swelling.  Gastrointestinal: Negative for abdominal pain, constipation, diarrhea, nausea and vomiting.  Skin: Negative for rash.      OBJECTIVE: Vitals:   04/13/2019 0149 04/11/2019 0313 04/12/2019 0522 03/30/2019 0750  BP: (!) 147/75  (!) 143/85 139/80  Pulse: 91  95 87  Resp: 16  18 19   Temp: 98.7 F (37.1 C)  98.6 F (37 C) 98.2 F (36.8 C)  TempSrc: Oral  Oral Oral  SpO2: 95%  97% 95%  Weight:  93.8 kg    Height:       Body mass index is 29.25 kg/m.  Physical Exam Constitutional:      General: He is not in acute distress.    Appearance: He is well-developed.     Comments: Seated in the chair next to the bed; pleasant.   Cardiovascular:     Rate and Rhythm: Normal rate and regular rhythm.     Heart sounds: Normal heart sounds.  Pulmonary:     Effort: Pulmonary effort is normal.     Breath sounds: Normal breath sounds.  Skin:    General: Skin is warm and dry.  Neurological:  Mental Status: He is alert and oriented to person, place, and time.  Psychiatric:        Behavior: Behavior normal.        Thought Content: Thought content normal.        Judgment: Judgment normal.     Lab Results Lab Results  Component Value Date   WBC 10.4 04/08/2019   HGB 10.7 (L) 04/12/2019   HCT 32.3 (L) 03/27/2019   MCV 87.8 03/20/2019   PLT 186 04/07/2019    Lab Results  Component Value Date   CREATININE 3.20 (H) 03/19/2019   BUN 61 (H) 03/19/2019   NA 140 03/23/2019   K 3.4 (L) 04/17/2019   CL 111 04/17/2019   CO2 21 (L) 04/13/2019    Lab Results  Component Value Date   ALT 19 03/28/2019   AST 28 03/28/2019   ALKPHOS 78 03/28/2019   BILITOT 1.3 (H) 03/28/2019     Microbiology: Recent Results (from the past 240 hour(s))  SARS CORONAVIRUS 2 (TAT 6-24 HRS) Nasopharyngeal  Nasopharyngeal Swab     Status: None   Collection Time: 03/19/2019  6:23 PM   Specimen: Nasopharyngeal Swab  Result Value Ref Range Status   SARS Coronavirus 2 NEGATIVE NEGATIVE Final    Comment: (NOTE) SARS-CoV-2 target nucleic acids are NOT DETECTED. The SARS-CoV-2 RNA is generally detectable in upper and lower respiratory specimens during the acute phase of infection. Negative results do not preclude SARS-CoV-2 infection, do not rule out co-infections with other pathogens, and should not be used as the sole basis for treatment or other patient management decisions. Negative results must be combined with clinical observations, patient history, and epidemiological information. The expected result is Negative. Fact Sheet for Patients: SugarRoll.be Fact Sheet for Healthcare Providers: https://www.woods-mathews.com/ This test is not yet approved or cleared by the [place] FDA and  has been authorized for detection and/or diagnosis of SARS-CoV-2 by FDA under an Emergency Use Authorization (EUA). This EUA will remain  in effect (meaning this test can be used) for the duration of the COVID-19 declaration under Section 56 4(b)(1) of the Act, 21 U.S.C. section 360bbb-3(b)(1), unless the authorization is terminated or revoked sooner. Performed at [facility], [place] [address] [place], [place] [address]   Culture, blood (routine x 2)     Status: None   Collection Time: 03/23/2019  6:47 PM   Specimen: BLOOD RIGHT ARM  Result Value Ref Range Status   Specimen Description BLOOD RIGHT ARM  Final   Special Requests   Final    BOTTLES DRAWN AEROBIC AND ANAEROBIC Blood Culture adequate volume   Culture   Final    NO GROWTH 6 DAYS Performed at [facility], [place] [address] [place], [place] [address]    Report Status 04/02/2019 FINAL  Final  Culture, blood (routine x 2)     Status: None   Collection Time: 04/06/2019  7:00 PM   Specimen:  BLOOD RIGHT WRIST  Result Value Ref Range Status   Specimen Description BLOOD RIGHT WRIST  Final   Special Requests   Final    BOTTLES DRAWN AEROBIC AND ANAEROBIC Blood Culture results may not be optimal due to an excessive volume of blood received in culture bottles   Culture   Final    NO GROWTH 6 DAYS Performed at [facility], [place] [address] [place], [place] [address]    Report Status 04/02/2019 FINAL  Final  Urine culture  Status: Abnormal   Collection Time: 04/03/2019 11:15 PM   Specimen: Urine, Random  Result Value Ref Range Status   Specimen Description URINE, RANDOM  Final   Special Requests NONE  Final   Culture (A)  Final    <10,000 COLONIES/mL INSIGNIFICANT GROWTH Performed at [facility], [address] [place], [place] [address]    Report Status 03/29/2019 FINAL  Final  MRSA PCR Screening     Status: None   Collection Time: 03/28/19  8:34 AM   Specimen: Nasal Mucosa; Nasopharyngeal  Result Value Ref Range Status   MRSA by PCR NEGATIVE NEGATIVE Final    Comment:        The GeneXpert MRSA Assay (FDA approved for NASAL specimens only), is one component of a comprehensive MRSA colonization surveillance program. It is not intended to diagnose MRSA infection nor to guide or monitor treatment for MRSA infections. Performed at [facility], [place] [address] [place], [place] [address]   C difficile quick scan w PCR reflex     Status: None   Collection Time: 03/29/19  2:47 AM   Specimen: STOOL  Result Value Ref Range Status   C Diff antigen NEGATIVE NEGATIVE Final   C Diff toxin NEGATIVE NEGATIVE Final   C Diff interpretation No C. difficile detected.  Final    Comment: Performed at [facility], [place] [address], [place], [place] [address]  GI pathogen panel by PCR, stool     Status: None   Collection Time: 03/29/19  8:17 AM   Specimen: Stool  Result Value Ref Range Status   Plesiomonas shigelloides NOT PERFORMED  Final    Comment: Test  not performed   Yersinia enterocolitica NOT PERFORMED  Final    Comment: Test not performed   Vibrio NOT PERFORMED  Final    Comment: Test not performed   Enteropathogenic E coli NOT PERFORMED  Final    Comment: Test not performed   E coli (ETEC) LT/ST NOT PERFORMED  Final    Comment: Test not performed   E coli 0157 by PCR NOT PERFORMED  Final    Comment: Test not performed   Cryptosporidium by PCR NOT PERFORMED  Final    Comment: Test not performed   Entamoeba histolytica NOT PERFORMED  Final    Comment: Test not performed   Adenovirus F 40/41 NOT PERFORMED  Final    Comment: Test not performed   Norovirus GI/GII NOT PERFORMED  Final    Comment: Test not performed   Sapovirus NOT PERFORMED  Final    Comment: (NOTE) Test not performed Performed At: [facility] [place], [place] [id number] Rush Farmer MD [id number]    Vibrio cholerae NOT PERFORMED  Final    Comment: Test not performed   Campylobacter by PCR [id number]  Final    Comment: (NOTE) The specimen submitted does not meet the laboratory's criteria for acceptability. Refer to [employer] [facility] for specimen acceptability criteria.      Required: ParaPak Orange      Received: Sterile Container      Mayra Neer was notified 03/30/2019.    Salmonella by PCR NOT PERFORMED  Final    Comment: Test not performed   E coli (STEC) NOT PERFORMED  Final    Comment: Test not performed   Enteroaggregative E coli NOT PERFORMED  Final    Comment: Test not performed   Shigella by PCR NOT PERFORMED  Final  Comment: Test not performed   Cyclospora cayetanensis NOT PERFORMED  Final    Comment: Test not performed   Astrovirus NOT PERFORMED  Final    Comment: Test not performed   G lamblia by PCR NOT PERFORMED  Final    Comment: Test not performed   Rotavirus A by PCR NOT PERFORMED  Final    Comment: Test not performed CALLED TO L.KING,RN AT 1732 03/30/19 BY L.PITT  Performed at [facility], [place] [address], [place], [place] [address]      Terri Piedra, [place] [facility] [employer] [phone] Pager  04/07/2019  11:07 AM

## 2019-04-03 NOTE — H&P (View-Only) (Signed)
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 Jimmy Footman.  Patient presented to the ED on 10/9 with sudden onset of fever, chills and tremors associated with right-sided chest pain at home.  In the ED, patient had a temperature 100.2, blood pressure was initially normal and later trended down as low as 86/57. Required low-flow oxygen supply to maintain O2 sat above 90%. Work-up in the ED showed n WBC count elevated to 21.4, hemoglobin 12.8, platelet 163, sodium 134, BUN/creatinine 35/1.64, INR therapeutic at 2.4, lactic acid level normal COVID-19 test negative. Urinalysis showed cloudy urine with WBC >50 and rare bacteria. Chest x-ray showed streaky airspace opacities in the left lung base possibly consolidation. CT abdomen and pelvis showed a 5.1 cm infrarenal abdominal aortic aneurysm. Also left lower quadrant renal transplant with mild perinephric stranding no hydronephrosis. 1.8 cm bladder calculus.  Patient initiated on IV antibiotics and is was admitted for further evaluation and management.  Over the course of last 3 days, patient has had unstable blood pressure down as low as 60s. Because of compromised renal perfusion, he has had AKI with significant rise in creatinine which is still ongoing. He was transferred to ICU on 10/11, briefly required pressors and transfer out on 10/12.  10/15: Per nursing, pt threatening to leave AMA 10/16: In a better mood today. Eager to get TEE done. Denies complaints.    Assessment & Plan:   Principal Problem:   Endocarditis Active Problems:   Hx of CABG    Kidney replaced by transplant   Sepsis secondary to UTI [facility])   Community acquired pneumonia   Sepsis with hypotension      - Met sepsis criteria on admission with leukocytosis, fever and potential source.       - Bld Cx did not show any growth.  UCx showed insignificant growth of less than 10,000 CFU per mL.       - TTE showed a possible oscillating density in the mitral valve. Scheduled for TEE but unable to do d/t elevated INR     - ID consultation appreciated.  Currently on IV Rocephin and IV daptomycin.      - BPs improved     - presumed source is endocarditis; however, INR is elevated so unable to do TEE right now; will give vit K today and see how well he reverses; coumadin is per pharmacy, currently held     - INR appropriate for TEE today; scheduled for this afternoon; continue current Tx for now  Acute kidney injury on a transplant kidney Metabolic acidosis     - Renal transplant in 1998.  Follows up with Dr. Jimmy Footman.      - Baseline creatinine 1.3-1.4.       - Patient presented with creatinine elevated to 1.64, steadily worsening, peaked at 6.09; it is 3.20 today.     - Fluid balance +9.7 L. UOP ok.     - NaHCO3 13108m BID; Bicarb is 16     - Immunosuppression changed from CellCept to mycophenolate by nephrology.     - If no further improvement in  creatinine, patient may end up requiring dialysis.     - nephrology onboard, appreciate assistance  Diarrhea     - resolved       - Patient had few episodes of greenish fluids bowel movement in first 2 days of hospitalization which he states is because of brown-colored CellCept that he has been getting in the hospital.  Once the pill was switched, diarrhea stopped.     - denies diarrhea prior to admission   Type 2 diabetes mellitus     - A1c 6.6 in 2018     - glucose ok  Mechanical aortic valve Supratherapeutic INR     - On Coumadin, INR 2.4 on presentation.  Pharmacy to dose Coumadin to maintain INR between 2.5-3.      - INR 5.7 today     - urine is blood tinged     - will add OT dose vit K     - 10/16: INR 1.4 today; heparin gtt/coumadin bridge  CAD s/p CABG 1995 Elevated troponin     - Troponin was slightly elevated at 21 on admission. Currently not complaining of chest pain.  Continue to monitor.     - Home meds include aspirin, statin; continue  Combined systolic and diastolic CHF  History of hypertension     - Echo 10/10, EF 45 to 50%, unchanged.     - Home meds include Lasix 40 mg daily, lisinopril 5 mg daily. lisinopril hold because of hypotension and AKI.     - currently on lasix 3m IV; monitor    Sinus bradycardia      - s/p loop recorder  BPH      - continue Flomax.  DVT prophylaxis: heparin gtt/coumadin Code Status: FULL   Disposition Plan: TBD  Consultants:   ID  Cardiology  Nephrolo  Antimicrobials:   Dapto, rocephin   ROS:  Denies N,V, CP. Remainder 10-pt ROS is negative for all not previously mentioned.  Subjective: "Well let's get it done then."  Objective: Vitals:   04/12/2019 0522 04/04/2019 0750 03/20/2019 1212 03/28/2019 1437  BP: (!) 143/85 139/80 (!) 132/106 (!) 155/64  Pulse: 95 87 78 90  Resp: _0 Temp: 98.6 F (37 C) 98.2 F (36.8 C) 98.8 F (37.1 C) 98.8 F (37.1 C)  TempSrc: Oral Oral Oral Oral  SpO2: 97% 95% 97% 98%  Weight:      Height:        Intake/Output Summary (Last 24 hours) at 04/08/2019 1444 Last data filed at 04/01/2019 1209 Gross per 24 hour  Intake 600 ml  Output 3400 ml  Net -2800 ml   Filed Weights   03/31/19 0404 04/02/19 0000 03/21/2019 0313  Weight: 96.6 kg 95.6 kg 93.8 kg    Examination:  General: 73y.o. male resting in chair in NAD Cardiovascular: RRR, +S1, S2, no m/g/r, equal pulses throughout Respiratory: CTABL, no w/r/r GI: BS+, NDNT, no masses noted, no organomegaly noted MSK: No e/c/c Neuro: Alert to name, follows commands Psyc: Appropriate interaction and affect, calm/cooperative    Data Reviewed: I have personally reviewed following labs and imaging studies.  CBC: Recent Labs  Lab 03/29/2019 1822  03/29/19 0703 03/29/19 1950 03/30/19 0216 04/01/19 0534 04/02/19 0716 04/05/2019 0507  WBC 21.4*   < > 24.0* 22.1* 20.9* 11.7* 10.2 10.4  NEUTROABS 20.1*  --  22.3*  --   --  10.8* 9.5* 8.5*  HGB 12.8*   < > 10.2* 11.7* 10.9* 10.3* 10.6*  10.7*  HCT 38.8*   < > 33.8* 39.3 35.1* 33.2* 32.9* 32.3*  MCV 87.8   < > 93.6 95.6 92.4 90.7 88.9 87.8  PLT 163   < > 126* 127* 121* 150 172 186   < > = values in this interval not displayed.   Basic Metabolic Panel: Recent Labs  Lab 03/29/19 0703  03/30/19 0216 03/31/19 0450 04/01/19 0534 04/02/19 0716 03/26/2019 0507  NA 135   < > 134* 134* 137  136 138 140  K 4.3   < > 4.2 4.1 4.0  4.0 3.5 3.4*  CL 106   < > 105 104 109  108 109 111  CO2 14*   < > 15* 15* 14*  14* 16* 21*  GLUCOSE 139*   < > 161* 151* 119*  120* 126* 130*  BUN 62*   < > 69* 80* 86*  86* 82* 61*  CREATININE 4.31*   < > 5.13* 6.09* 6.00*  6.02* 4.39* 3.20*  CALCIUM 7.0*   < > 7.0* 6.9* 6.9*  6.9* 7.0* 7.1*  MG 1.4*  --  2.2  --  2.2  --  2.1  PHOS  --   --   --  3.6 3.7  3.7 3.0 2.8   < > = values in this interval not displayed.   GFR: Estimated Creatinine Clearance: 23.1 mL/min (A) (by C-G formula based on SCr of 3.2 mg/dL (H)). Liver Function Tests: Recent Labs  Lab 03/20/2019 1822 03/28/19 0220 03/31/19 0450 04/01/19 0534 04/02/19 0716 04/05/2019 0507  AST 23 28  --   --   --   --   ALT 19 19  --   --   --   --   ALKPHOS 85 78  --   --   --   --   BILITOT 1.1 1.3*  --   --   --   --   PROT 7.1 6.5  --   --   --   --   ALBUMIN 4.3 3.9 3.0* 2.8* 2.8* 2.9*   No results for input(s): LIPASE, AMYLASE in the last 168 hours. No results for input(s): AMMONIA in the last 168 hours. Coagulation Profile: Recent Labs  Lab 03/30/19 0216 03/31/19 0450 04/01/19 0534 04/02/19 0716 04/17/2019 0507  INR 2.8* 4.5* 6.6* 5.7* 1.4*   Cardiac  Enzymes: Recent Labs  Lab 04/01/19 0534 04/18/2019 0507  CKTOTAL 289 188   BNP (last 3 results) No results for input(s): PROBNP in the last 8760 hours. HbA1C: No results for input(s): HGBA1C in the last 72 hours. CBG: Recent Labs  Lab 03/28/19 1640 03/28/19 2134 03/29/19 0649 03/29/19 1206 03/29/19 1854  GLUCAP 150* 125* 143* 162* 133*   Lipid Profile: No results for input(s): CHOL, HDL, LDLCALC, TRIG, CHOLHDL, LDLDIRECT in the last 72 hours. Thyroid Function Tests: No results for input(s): TSH, T4TOTAL, FREET4, T3FREE, THYROIDAB in the last 72 hours. Anemia Panel: No results for input(s): VITAMINB12, FOLATE, FERRITIN, TIBC, IRON, RETICCTPCT in the last 72 hours. Sepsis Labs: Recent Labs  Lab 04/06/2019 1822 03/28/19 0220 03/29/19 1950 03/30/19 0932  PROCALCITON 0.18  --   --  28.50  LATICACIDVEN 0.9 1.6 0.9  --     Recent Results (from the past 240 hour(s))  SARS CORONAVIRUS 2 (TAT 6-24 HRS) Nasopharyngeal Nasopharyngeal Swab     Status: None   Collection Time: 03/31/2019  6:23 PM   Specimen: Nasopharyngeal Swab  Result Value Ref Range Status   SARS Coronavirus  2 NEGATIVE NEGATIVE Final    Comment: (NOTE) SARS-CoV-2 target nucleic acids are NOT DETECTED. The SARS-CoV-2 RNA is generally detectable in upper and lower respiratory specimens during the acute phase of infection. Negative results do not preclude SARS-CoV-2 infection, do not rule out co-infections with other pathogens, and should not be used as the sole basis for treatment or other patient management decisions. Negative results must be combined with clinical observations, patient history, and epidemiological information. The expected result is Negative. Fact Sheet for Patients: SugarRoll.be Fact Sheet for Healthcare Providers: https://www.woods-mathews.com/ This test is not yet approved or cleared by the [place] FDA and  has been authorized for detection  and/or diagnosis of SARS-CoV-2 by FDA under an Emergency Use Authorization (EUA). This EUA will remain  in effect (meaning this test can be used) for the duration of the COVID-19 declaration under Section 56 4(b)(1) of the Act, 21 U.S.C. section 360bbb-3(b)(1), unless the authorization is terminated or revoked sooner. Performed at [facility], [place] [address], [place], [place] [address]   Culture, blood (routine x 2)     Status: None   Collection Time: 03/21/2019  6:47 PM   Specimen: BLOOD RIGHT ARM  Result Value Ref Range Status   Specimen Description BLOOD RIGHT ARM  Final   Special Requests   Final    BOTTLES DRAWN AEROBIC AND ANAEROBIC Blood Culture adequate volume   Culture   Final    NO GROWTH 6 DAYS Performed at [facility], [place] [address] [place], [place] [address]    Report Status 04/02/2019 FINAL  Final  Culture, blood (routine x 2)     Status: None   Collection Time: 04/12/2019  7:00 PM   Specimen: BLOOD RIGHT WRIST  Result Value Ref Range Status   Specimen Description BLOOD RIGHT WRIST  Final   Special Requests   Final    BOTTLES DRAWN AEROBIC AND ANAEROBIC Blood Culture results may not be optimal due to an excessive volume of blood received in culture bottles   Culture   Final    NO GROWTH 6 DAYS Performed at [facility], [place] [address], [place], [place] [address]    Report Status 04/02/2019 FINAL  Final  Urine culture     Status: Abnormal   Collection Time: 04/13/2019 11:15 PM   Specimen: Urine, Random  Result Value Ref Range Status   Specimen Description URINE, RANDOM  Final   Special Requests NONE  Final   Culture (A)  Final    <10,000 COLONIES/mL INSIGNIFICANT GROWTH Performed at [facility], [place] [address], [place], [place] [address]    Report Status 03/29/2019 FINAL  Final  MRSA PCR Screening     Status: None   Collection Time: 03/28/19  8:34 AM   Specimen: Nasal Mucosa; Nasopharyngeal  Result Value Ref Range Status   MRSA by  PCR NEGATIVE NEGATIVE Final    Comment:        The GeneXpert MRSA Assay (FDA approved for NASAL specimens only), is one component of a comprehensive MRSA colonization surveillance program. It is not intended to diagnose MRSA infection nor to guide or monitor treatment for MRSA infections. Performed at [facility], [place] [address] [place], [place] [address]   C difficile quick scan w PCR reflex     Status: None   Collection Time: 03/29/19  2:47 AM   Specimen: STOOL  Result Value Ref Range Status   C Diff antigen NEGATIVE  NEGATIVE Final   C Diff toxin NEGATIVE NEGATIVE Final   C Diff interpretation No C. difficile detected.  Final    Comment: Performed at [facility], [place] [address] [place], [place] [address]  GI pathogen panel by PCR, stool     Status: None   Collection Time: 03/29/19  8:17 AM   Specimen: Stool  Result Value Ref Range Status   Plesiomonas shigelloides NOT PERFORMED  Final    Comment: Test not performed   Yersinia enterocolitica NOT PERFORMED  Final    Comment: Test not performed   Vibrio NOT PERFORMED  Final    Comment: Test not performed   Enteropathogenic E coli NOT PERFORMED  Final    Comment: Test not performed   E coli (ETEC) LT/ST NOT PERFORMED  Final    Comment: Test not performed   E coli 0157 by PCR NOT PERFORMED  Final    Comment: Test not performed   Cryptosporidium by PCR NOT PERFORMED  Final    Comment: Test not performed   Entamoeba histolytica NOT PERFORMED  Final    Comment: Test not performed   Adenovirus F 40/41 NOT PERFORMED  Final    Comment: Test not performed   Norovirus GI/GII NOT PERFORMED  Final    Comment: Test not performed   Sapovirus NOT PERFORMED  Final    Comment: (NOTE) Test not performed Performed At: [facility] [place], [place] [id number] Rush Farmer MD GE:[id number]    Vibrio cholerae NOT PERFORMED  Final    Comment: Test not performed   Campylobacter by PCR WNU2  Final     Comment: (NOTE) The specimen submitted does not meet the laboratory's criteria for acceptability. Refer to [employer] [facility] for specimen acceptability criteria.      Required: ParaPak Orange      Received: Sterile Container      Mayra Neer was notified 03/30/2019.    Salmonella by PCR NOT PERFORMED  Final    Comment: Test not performed   E coli (STEC) NOT PERFORMED  Final    Comment: Test not performed   Enteroaggregative E coli NOT PERFORMED  Final    Comment: Test not performed   Shigella by PCR NOT PERFORMED  Final    Comment: Test not performed   Cyclospora cayetanensis NOT PERFORMED  Final    Comment: Test not performed   Astrovirus NOT PERFORMED  Final    Comment: Test not performed   G lamblia by PCR NOT PERFORMED  Final    Comment: Test not performed   Rotavirus A by PCR NOT PERFORMED  Final    Comment: Test not performed CALLED TO L.KING,RN AT 1732 03/30/19 BY L.PITT  Performed at [facility], [place] [address] [place], [place] [address]       Radiology Studies: No results found.   Scheduled Meds: . [MAR Hold] aspirin EC  81 mg Oral Daily  . [MAR Hold] atorvastatin  80 mg Oral Daily  . [MAR Hold] calcitRIOL  0.5 mcg Oral Once per day on Tue Thu Sat   And  . [MAR Hold] calcitRIOL  1 mcg Oral Once per day on Sun Mon Wed Fri  . [MAR Hold] Chlorhexidine Gluconate Cloth  6 each Topical Daily  . [MAR Hold] furosemide  40 mg Intravenous Daily  . influenza vaccine adjuvanted  0.5 mL Intramuscular Tomorrow-1000  . [MAR Hold] mouth rinse  15 mL Mouth Rinse BID  . [  MAR Hold] mycophenolate  720 mg Oral BID  . pneumococcal 23 valent vaccine  0.5 mL Intramuscular Tomorrow-1000  . [MAR Hold] sodium bicarbonate  1,300 mg Oral BID  . [MAR Hold] tamsulosin  0.4 mg Oral Daily  . [MAR Hold] warfarin  5 mg Oral ONCE-1800  . [MAR Hold] Warfarin - Pharmacist Dosing Inpatient   Does not apply q1800   Continuous Infusions: . [MAR Hold] sodium chloride 250 mL  (03/30/2019 0932)  . sodium chloride Stopped (04/01/19 1257)  . [MAR Hold] cefTRIAXone (ROCEPHIN)  IV 2 g (03/23/2019 1146)  . [MAR Hold] DAPTOmycin (CUBICIN)  IV 800 mg (04/02/19 2010)  . heparin 1,300 Units/hr (03/30/2019 1015)     LOS: 7 days    Time spent: 25 minutes spent in the coordination of care today.    Jonnie Finner, DO [facility] Pager [phone]  If 7PM-7AM, please contact night-coverage [email or web address] Password [email or web address] 04/07/2019, 2:44 PM

## 2019-04-03 NOTE — Progress Notes (Signed)
ANTICOAGULATION CONSULT NOTE - Follow Up Consult  Pharmacy Consult for Heparin Indication: Afib, mechanical AVR  Allergies  Allergen Reactions  . Vancomycin     Acute renal failure and h/o renal transplant. DO NOT GIVE!    Patient Measurements: Height: 5' 10.5" (179.1 cm) Weight: 206 lb 12.8 oz (93.8 kg) IBW/kg (Calculated) : 74.15  Vital Signs: Temp: 97.6 F (36.4 C) (10/16 1632) Temp Source: Oral (10/16 1632) BP: 142/73 (10/16 1632) Pulse Rate: 75 (10/16 1632)  Labs: Recent Labs    04/01/19 0534 04/02/19 0716 04/02/2019 0507 03/30/2019 1723  HGB 10.3* 10.6* 10.7*  --   HCT 33.2* 32.9* 32.3*  --   PLT 150 172 186  --   LABPROT 56.7* 50.3* 17.1*  --   INR 6.6* 5.7* 1.4*  --   HEPARINUNFRC  --   --   --  0.27*  CREATININE 6.00*  6.02* 4.39* 3.20*  --   CKTOTAL 289  --  188  --     Estimated Creatinine Clearance: 23.1 mL/min (A) (by C-G formula based on SCr of 3.2 mg/dL (H)).  Assessment: 73 year continues on heparin while warfarin is on hold Heparin level 0.27  Goal of Therapy:  Heparin level 0.3-0.7 units/ml Monitor platelets by anticoagulation protocol: Yes   Plan:  Increase heparin to 1500 units / hr Follow up AM heparin level, CBC  Thank you Anette Guarneri, PharmD  03/20/2019,6:32 PM

## 2019-04-03 NOTE — Progress Notes (Signed)
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 Jimmy Footman.  Patient presented to the ED on 10/9 with sudden onset of fever, chills and tremors associated with right-sided chest pain at home.  In the ED, patient had a temperature 100.2, blood pressure was initially normal and later trended down as low as 86/57. Required low-flow oxygen supply to maintain O2 sat above 90%. Work-up in the ED showed n WBC count elevated to 21.4, hemoglobin 12.8, platelet 163, sodium 134, BUN/creatinine 35/1.64, INR therapeutic at 2.4, lactic acid level normal COVID-19 test negative. Urinalysis showed cloudy urine with WBC >50 and rare bacteria. Chest x-ray showed streaky airspace opacities in the left lung base possibly consolidation. CT abdomen and pelvis showed a 5.1 cm infrarenal abdominal aortic aneurysm. Also left lower quadrant renal transplant with mild perinephric stranding no hydronephrosis. 1.8 cm bladder calculus.  Patient initiated on IV antibiotics and is was admitted for further evaluation and management.  Over the course of last 3 days, patient has had unstable blood pressure down as low as 60s. Because of compromised renal perfusion, he has had AKI with significant rise in creatinine which is still ongoing. He was transferred to ICU on 10/11, briefly required pressors and transfer out on 10/12.  10/15: Per nursing, pt threatening to leave AMA 10/16: In a better mood today. Eager to get TEE done. Denies complaints.    Assessment & Plan:   Principal Problem:   Endocarditis Active Problems:   Hx of CABG    Kidney replaced by transplant   Sepsis secondary to UTI [facility])   Community acquired pneumonia   Sepsis with hypotension      - Met sepsis criteria on admission with leukocytosis, fever and potential source.       - Bld Cx did not show any growth.  UCx showed insignificant growth of less than 10,000 CFU per mL.       - TTE showed a possible oscillating density in the mitral valve. Scheduled for TEE but unable to do d/t elevated INR     - ID consultation appreciated.  Currently on IV Rocephin and IV daptomycin.      - BPs improved     - presumed source is endocarditis; however, INR is elevated so unable to do TEE right now; will give vit K today and see how well he reverses; coumadin is per pharmacy, currently held     - INR appropriate for TEE today; scheduled for this afternoon; continue current Tx for now  Acute kidney injury on a transplant kidney Metabolic acidosis     - Renal transplant in 1998.  Follows up with Dr. Jimmy Footman.      - Baseline creatinine 1.3-1.4.       - Patient presented with creatinine elevated to 1.64, steadily worsening, peaked at 6.09; it is 3.20 today.     - Fluid balance +9.7 L. UOP ok.     - NaHCO3 13108m BID; Bicarb is 16     - Immunosuppression changed from CellCept to mycophenolate by nephrology.     - If no further improvement in  creatinine, patient may end up requiring dialysis.     - nephrology onboard, appreciate assistance  Diarrhea     - resolved       - Patient had few episodes of greenish fluids bowel movement in first 2 days of hospitalization which he states is because of brown-colored CellCept that he has been getting in the hospital.  Once the pill was switched, diarrhea stopped.     - denies diarrhea prior to admission   Type 2 diabetes mellitus     - A1c 6.6 in 2018     - glucose ok  Mechanical aortic valve Supratherapeutic INR     - On Coumadin, INR 2.4 on presentation.  Pharmacy to dose Coumadin to maintain INR between 2.5-3.      - INR 5.7 today     - urine is blood tinged     - will add OT dose vit K     - 10/16: INR 1.4 today; heparin gtt/coumadin bridge  CAD s/p CABG 1995 Elevated troponin     - Troponin was slightly elevated at 21 on admission. Currently not complaining of chest pain.  Continue to monitor.     - Home meds include aspirin, statin; continue  Combined systolic and diastolic CHF  History of hypertension     - Echo 10/10, EF 45 to 50%, unchanged.     - Home meds include Lasix 40 mg daily, lisinopril 5 mg daily. lisinopril hold because of hypotension and AKI.     - currently on lasix 3m IV; monitor    Sinus bradycardia      - s/p loop recorder  BPH      - continue Flomax.  DVT prophylaxis: heparin gtt/coumadin Code Status: FULL   Disposition Plan: TBD  Consultants:   ID  Cardiology  Nephrolo  Antimicrobials:   Dapto, rocephin   ROS:  Denies N,V, CP. Remainder 10-pt ROS is negative for all not previously mentioned.  Subjective: "Well let's get it done then."  Objective: Vitals:   03/30/2019 0522 04/06/2019 0750 03/20/2019 1212 03/29/2019 1437  BP: (!) 143/85 139/80 (!) 132/106 (!) 155/64  Pulse: 95 87 78 90  Resp: _0 Temp: 98.6 F (37 C) 98.2 F (36.8 C) 98.8 F (37.1 C) 98.8 F (37.1 C)  TempSrc: Oral Oral Oral Oral  SpO2: 97% 95% 97% 98%  Weight:      Height:        Intake/Output Summary (Last 24 hours) at 03/29/2019 1444 Last data filed at 03/22/2019 1209 Gross per 24 hour  Intake 600 ml  Output 3400 ml  Net -2800 ml   Filed Weights   03/31/19 0404 04/02/19 0000 03/25/2019 0313  Weight: 96.6 kg 95.6 kg 93.8 kg    Examination:  General: 73y.o. male resting in chair in NAD Cardiovascular: RRR, +S1, S2, no m/g/r, equal pulses throughout Respiratory: CTABL, no w/r/r GI: BS+, NDNT, no masses noted, no organomegaly noted MSK: No e/c/c Neuro: Alert to name, follows commands Psyc: Appropriate interaction and affect, calm/cooperative    Data Reviewed: I have personally reviewed following labs and imaging studies.  CBC: Recent Labs  Lab 04/13/2019 1822  03/29/19 0703 03/29/19 1950 03/30/19 0216 04/01/19 0534 04/02/19 0716 03/29/2019 0507  WBC 21.4*   < > 24.0* 22.1* 20.9* 11.7* 10.2 10.4  NEUTROABS 20.1*  --  22.3*  --   --  10.8* 9.5* 8.5*  HGB 12.8*   < > 10.2* 11.7* 10.9* 10.3* 10.6*  10.7*  HCT 38.8*   < > 33.8* 39.3 35.1* 33.2* 32.9* 32.3*  MCV 87.8   < > 93.6 95.6 92.4 90.7 88.9 87.8  PLT 163   < > 126* 127* 121* 150 172 186   < > = values in this interval not displayed.   Basic Metabolic Panel: Recent Labs  Lab 03/29/19 0703  03/30/19 0216 03/31/19 0450 04/01/19 0534 04/02/19 0716 04/06/2019 0507  NA 135   < > 134* 134* 137  136 138 140  K 4.3   < > 4.2 4.1 4.0  4.0 3.5 3.4*  CL 106   < > 105 104 109  108 109 111  CO2 14*   < > 15* 15* 14*  14* 16* 21*  GLUCOSE 139*   < > 161* 151* 119*  120* 126* 130*  BUN 62*   < > 69* 80* 86*  86* 82* 61*  CREATININE 4.31*   < > 5.13* 6.09* 6.00*  6.02* 4.39* 3.20*  CALCIUM 7.0*   < > 7.0* 6.9* 6.9*  6.9* 7.0* 7.1*  MG 1.4*  --  2.2  --  2.2  --  2.1  PHOS  --   --   --  3.6 3.7  3.7 3.0 2.8   < > = values in this interval not displayed.   GFR: Estimated Creatinine Clearance: 23.1 mL/min (A) (by C-G formula based on SCr of 3.2 mg/dL (H)). Liver Function Tests: Recent Labs  Lab 04/04/2019 1822 03/28/19 0220 03/31/19 0450 04/01/19 0534 04/02/19 0716 04/12/2019 0507  AST 23 28  --   --   --   --   ALT 19 19  --   --   --   --   ALKPHOS 85 78  --   --   --   --   BILITOT 1.1 1.3*  --   --   --   --   PROT 7.1 6.5  --   --   --   --   ALBUMIN 4.3 3.9 3.0* 2.8* 2.8* 2.9*   No results for input(s): LIPASE, AMYLASE in the last 168 hours. No results for input(s): AMMONIA in the last 168 hours. Coagulation Profile: Recent Labs  Lab 03/30/19 0216 03/31/19 0450 04/01/19 0534 04/02/19 0716 04/10/2019 0507  INR 2.8* 4.5* 6.6* 5.7* 1.4*   Cardiac  Enzymes: Recent Labs  Lab 04/01/19 0534 03/22/2019 0507  CKTOTAL 289 188   BNP (last 3 results) No results for input(s): PROBNP in the last 8760 hours. HbA1C: No results for input(s): HGBA1C in the last 72 hours. CBG: Recent Labs  Lab 03/28/19 1640 03/28/19 2134 03/29/19 0649 03/29/19 1206 03/29/19 1854  GLUCAP 150* 125* 143* 162* 133*   Lipid Profile: No results for input(s): CHOL, HDL, LDLCALC, TRIG, CHOLHDL, LDLDIRECT in the last 72 hours. Thyroid Function Tests: No results for input(s): TSH, T4TOTAL, FREET4, T3FREE, THYROIDAB in the last 72 hours. Anemia Panel: No results for input(s): VITAMINB12, FOLATE, FERRITIN, TIBC, IRON, RETICCTPCT in the last 72 hours. Sepsis Labs: Recent Labs  Lab 03/26/2019 1822 03/28/19 0220 03/29/19 1950 03/30/19 0932  PROCALCITON 0.18  --   --  28.50  LATICACIDVEN 0.9 1.6 0.9  --     Recent Results (from the past 240 hour(s))  SARS CORONAVIRUS 2 (TAT 6-24 HRS) Nasopharyngeal Nasopharyngeal Swab     Status: None   Collection Time: 04/15/2019  6:23 PM   Specimen: Nasopharyngeal Swab  Result Value Ref Range Status   SARS Coronavirus  2 NEGATIVE NEGATIVE Final    Comment: (NOTE) SARS-CoV-2 target nucleic acids are NOT DETECTED. The SARS-CoV-2 RNA is generally detectable in upper and lower respiratory specimens during the acute phase of infection. Negative results do not preclude SARS-CoV-2 infection, do not rule out co-infections with other pathogens, and should not be used as the sole basis for treatment or other patient management decisions. Negative results must be combined with clinical observations, patient history, and epidemiological information. The expected result is Negative. Fact Sheet for Patients: SugarRoll.be Fact Sheet for Healthcare Providers: https://www.woods-mathews.com/ This test is not yet approved or cleared by the [place] FDA and  has been authorized for detection  and/or diagnosis of SARS-CoV-2 by FDA under an Emergency Use Authorization (EUA). This EUA will remain  in effect (meaning this test can be used) for the duration of the COVID-19 declaration under Section 56 4(b)(1) of the Act, 21 U.S.C. section 360bbb-3(b)(1), unless the authorization is terminated or revoked sooner. Performed at [facility], [place] [address], [place], [place] [address]   Culture, blood (routine x 2)     Status: None   Collection Time: 03/20/2019  6:47 PM   Specimen: BLOOD RIGHT ARM  Result Value Ref Range Status   Specimen Description BLOOD RIGHT ARM  Final   Special Requests   Final    BOTTLES DRAWN AEROBIC AND ANAEROBIC Blood Culture adequate volume   Culture   Final    NO GROWTH 6 DAYS Performed at [facility], [place] [address], [place], [place] [address]    Report Status 04/02/2019 FINAL  Final  Culture, blood (routine x 2)     Status: None   Collection Time: 03/30/2019  7:00 PM   Specimen: BLOOD RIGHT WRIST  Result Value Ref Range Status   Specimen Description BLOOD RIGHT WRIST  Final   Special Requests   Final    BOTTLES DRAWN AEROBIC AND ANAEROBIC Blood Culture results may not be optimal due to an excessive volume of blood received in culture bottles   Culture   Final    NO GROWTH 6 DAYS Performed at [facility], [place] [address], [place], [place] [address]    Report Status 04/02/2019 FINAL  Final  Urine culture     Status: Abnormal   Collection Time: 03/23/2019 11:15 PM   Specimen: Urine, Random  Result Value Ref Range Status   Specimen Description URINE, RANDOM  Final   Special Requests NONE  Final   Culture (A)  Final    <10,000 COLONIES/mL INSIGNIFICANT GROWTH Performed at [facility], [place] [address], [place], [place] [address]    Report Status 03/29/2019 FINAL  Final  MRSA PCR Screening     Status: None   Collection Time: 03/28/19  8:34 AM   Specimen: Nasal Mucosa; Nasopharyngeal  Result Value Ref Range Status   MRSA by  PCR NEGATIVE NEGATIVE Final    Comment:        The GeneXpert MRSA Assay (FDA approved for NASAL specimens only), is one component of a comprehensive MRSA colonization surveillance program. It is not intended to diagnose MRSA infection nor to guide or monitor treatment for MRSA infections. Performed at [facility], [place] [address], [place], [place] [address]   C difficile quick scan w PCR reflex     Status: None   Collection Time: 03/29/19  2:47 AM   Specimen: STOOL  Result Value Ref Range Status   C Diff antigen NEGATIVE  NEGATIVE Final   C Diff toxin NEGATIVE NEGATIVE Final   C Diff interpretation No C. difficile detected.  Final    Comment: Performed at [facility], [place] [address] [place], [place] [address]  GI pathogen panel by PCR, stool     Status: None   Collection Time: 03/29/19  8:17 AM   Specimen: Stool  Result Value Ref Range Status   Plesiomonas shigelloides NOT PERFORMED  Final    Comment: Test not performed   Yersinia enterocolitica NOT PERFORMED  Final    Comment: Test not performed   Vibrio NOT PERFORMED  Final    Comment: Test not performed   Enteropathogenic E coli NOT PERFORMED  Final    Comment: Test not performed   E coli (ETEC) LT/ST NOT PERFORMED  Final    Comment: Test not performed   E coli 0157 by PCR NOT PERFORMED  Final    Comment: Test not performed   Cryptosporidium by PCR NOT PERFORMED  Final    Comment: Test not performed   Entamoeba histolytica NOT PERFORMED  Final    Comment: Test not performed   Adenovirus F 40/41 NOT PERFORMED  Final    Comment: Test not performed   Norovirus GI/GII NOT PERFORMED  Final    Comment: Test not performed   Sapovirus NOT PERFORMED  Final    Comment: (NOTE) Test not performed Performed At: [facility] [place], [place] [id number] Rush Farmer MD GE:[id number]    Vibrio cholerae NOT PERFORMED  Final    Comment: Test not performed   Campylobacter by PCR WNU2  Final     Comment: (NOTE) The specimen submitted does not meet the laboratory's criteria for acceptability. Refer to [employer] [facility] Services for specimen acceptability criteria.      Required: ParaPak Orange      Received: Sterile Container      Mayra Neer was notified 03/30/2019.    Salmonella by PCR NOT PERFORMED  Final    Comment: Test not performed   E coli (STEC) NOT PERFORMED  Final    Comment: Test not performed   Enteroaggregative E coli NOT PERFORMED  Final    Comment: Test not performed   Shigella by PCR NOT PERFORMED  Final    Comment: Test not performed   Cyclospora cayetanensis NOT PERFORMED  Final    Comment: Test not performed   Astrovirus NOT PERFORMED  Final    Comment: Test not performed   G lamblia by PCR NOT PERFORMED  Final    Comment: Test not performed   Rotavirus A by PCR NOT PERFORMED  Final    Comment: Test not performed CALLED TO L.KING,RN AT 1732 03/30/19 BY L.PITT  Performed at [facility], [place] [address] [place], [place] [address]       Radiology Studies: No results found.   Scheduled Meds: . [MAR Hold] aspirin EC  81 mg Oral Daily  . [MAR Hold] atorvastatin  80 mg Oral Daily  . [MAR Hold] calcitRIOL  0.5 mcg Oral Once per day on Tue Thu Sat   And  . [MAR Hold] calcitRIOL  1 mcg Oral Once per day on Sun Mon Wed Fri  . [MAR Hold] Chlorhexidine Gluconate Cloth  6 each Topical Daily  . [MAR Hold] furosemide  40 mg Intravenous Daily  . influenza vaccine adjuvanted  0.5 mL Intramuscular Tomorrow-1000  . [MAR Hold] mouth rinse  15 mL Mouth Rinse BID  . [  MAR Hold] mycophenolate  720 mg Oral BID  . pneumococcal 23 valent vaccine  0.5 mL Intramuscular Tomorrow-1000  . [MAR Hold] sodium bicarbonate  1,300 mg Oral BID  . [MAR Hold] tamsulosin  0.4 mg Oral Daily  . [MAR Hold] warfarin  5 mg Oral ONCE-1800  . [MAR Hold] Warfarin - Pharmacist Dosing Inpatient   Does not apply q1800   Continuous Infusions: . [MAR Hold] sodium chloride 250 mL  (04/18/2019 0932)  . sodium chloride Stopped (04/01/19 1257)  . [MAR Hold] cefTRIAXone (ROCEPHIN)  IV 2 g (04/04/2019 1146)  . [MAR Hold] DAPTOmycin (CUBICIN)  IV 800 mg (04/02/19 2010)  . heparin 1,300 Units/hr (04/04/2019 1015)     LOS: 7 days    Time spent: 25 minutes spent in the coordination of care today.    Jonnie Finner, DO [facility] Pager [phone]  If 7PM-7AM, please contact night-coverage [email or web address] Password [email or web address] 04/08/2019, 2:44 PM

## 2019-04-03 NOTE — Progress Notes (Signed)
ANTICOAGULATION CONSULT NOTE - Initial Consult  Pharmacy Consult for Heparin + Coumadin + Dapto Indication: Afib + mechanical AVR (goal 2.5-3.5) + mitral valve vegetation  Allergies  Allergen Reactions  . Vancomycin     Acute renal failure and h/o renal transplant. DO NOT GIVE!    Patient Measurements: Height: 5' 10.5" (179.1 cm) Weight: 206 lb 12.8 oz (93.8 kg) IBW/kg (Calculated) : 74.15 Heparin Dosing Weight: 93kg  Vital Signs: Temp: 98.2 F (36.8 C) (10/16 0750) Temp Source: Oral (10/16 0750) BP: 139/80 (10/16 0750) Pulse Rate: 87 (10/16 0750)  Labs: Recent Labs    04/01/19 0534 04/02/19 0716 03/19/2019 0507  HGB 10.3* 10.6* 10.7*  HCT 33.2* 32.9* 32.3*  PLT 150 172 186  LABPROT 56.7* 50.3* 17.1*  INR 6.6* 5.7* 1.4*  CREATININE 6.00*  6.02* 4.39* 3.20*  CKTOTAL 289  --  188    Estimated Creatinine Clearance: 23.1 mL/min (A) (by C-G formula based on SCr of 3.2 mg/dL (H)).   Medical History: Past Medical History:  Diagnosis Date  . Arthritis   . Bifascicular block    First degree AV block, right bundle branch block left anterior fascicular block.  Re-eval with event monitor 07/2017--cardiology  . Blood transfusion without reported diagnosis   . BPH with obstruction/lower urinary tract symptoms   . Carotid stenosis   . Chronic gout    due to renal impairment  . 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 Jimmy Footman.  GFR stable at 55 ml/min (Cr 1.28) 07/16/17.  Neph f/u 08/08/17, Cr up to 1.71, GFR 39 (in context of vol ovld & imp bladder emptying b/c out of flomax x 1 wk).  Cr  1.38 and GFR 50 ml/min as of 08/21/17 renal f/u.  Cr 1.27, GFR 56 on renal f/u 10/01/17. Cr 1.24, GFR 57 on 01/06/18. Cr 1.41, GFR 49 Jan 2020. Stab GFR 09/2018   . COPD (chronic obstructive pulmonary disease) ([place])   . Coronary artery disease    CABG 1995.  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 Diabetes  mellitus without complication ([place]) 56/3875   New dx 10/15/16 by A1c criteria (6.6%)--recommended nutritionist referral/no meds at initial dx.  . 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 Glaucoma   . H/O paroxysmal supraventricular tachycardia    with wide complex. + Hx of a-fib as well.  Coumadin mgmt --nephrology  . History of renal transplant 1988  . Hyperlipidemia   . Hypertension   . ICD (implantable cardioverter-defibrillator) in place    Normal device check 01/28/16.  . Ischemic cardiomyopathy    Ischemic heart disease and mild depressed LV and RV function + DD and mild pulm HTN  . Post-transplant erythrocytosis   . 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  //  Echo 2/18: mild LVH, EF 45-50, diff HK, Gr 1 DD, severe MAC, mild Mitral Stenosis, normally functioning mechanical AVR (mean 12, peak 25).  Per cards, repeat echo planned for 2020.  Marland Kitchen Secondary hyperparathyroidism ([place])    parathyroidectomy done; subsequently has hypoparathyroidism, takes vit D  . Sinus bradycardia   . Syncope    He is s/p loop recorder insertion; as of Dr. Olin Pia f/u 01/16/16 he has had no further syncope and no significant arrhythmias.  He elected to keep the loop recorder in and was told to f/u 1 yr with Dr. Caryl Comes.   NORMAL DEVICE REMOTE TRANSMISSION--NO ATRIAL FIBRILLATION--as  of 09/02/15.   Assessment:  Anticoag: Warfarin PTA for Afib + mechanical AVR (goal 2.5-3.5 per MD). INR 2.8>>4.5>6.6>5.7>1.4. Hgb 10.7. Plts 186 - 10/15: Vit K 24m IV x 1 - 10/16: Reddish/orange urine noted by RN. Pt refused foley removal  **PTA regimen 743mTues/Thurs, 74m40mODs  ID: Dapto/CTX for possible vegetation on mitral valve. PC 28.5 (10/12). WBC 10.4. Afebrile. Cr 3.2 declining - TTE showed possible vegetation on mitral valve with no growth on blood cultures.  - TTE showed a possible oscillating density in the mitral valve - - TEE: perform when INR lower   Vancomycin  10/09 >> 10/11 Cefepime 10/09 >> 10/13 Dapto 10/13> Ceftriaxone 10/13>> Flagyl 10/09 X 1   10/11 Cdiff: neg 10/10 MRSA PCR: neg 10/9: Ucx: insignificant growth 10/09 BCx: negative 10/09 COVID: neg   Goal of Therapy:  Heparin level 0.3-0.7 units/ml  INR 2.5-3.5 Monitor platelets by anticoagulation protocol: Yes   Plan:  Start IV heparin per Dr. KylMarylyn Ishiharatart IV heparin for bridging with 4000 unit IV bolus, infusion 1300 units/hr. Check HL 6-8 hrs after started -Coumadin 74mg59m x 1 tonight (s/p Vit K 10/15) -Daily HL, CBC, INR  - Continue Daptomycin 800 mg every 48 (8 mg/kg) IV once every 48 hours (for CrCl<30) - Ceftriaxone 2 gm every 24 hours  - f/u TEE   Nitish Roes S. RobeAlford HighlandarmD, BCPS Clinical Staff Pharmacist RobeEilene Ghazillinger 03/28/2019,9:21 AM

## 2019-04-03 NOTE — Interval H&P Note (Signed)
History and Physical Interval Note:  04/04/2019 3:03 PM  John Barton  has presented today for surgery, with the diagnosis of BACTEREMIA.  The various methods of treatment have been discussed with the patient and family. After consideration of risks, benefits and other options for treatment, the patient has consented to  Procedure(s): TRANSESOPHAGEAL ECHOCARDIOGRAM (TEE) (N/A) as a surgical intervention.  The patient's history has been reviewed, patient examined, no change in status, stable for surgery.  I have reviewed the patient's chart and labs.  Questions were answered to the patient's satisfaction.     Donato Heinz

## 2019-04-03 NOTE — Progress Notes (Addendum)
  Echocardiogram Transesophageal echocardiogram has been performed.  John Barton 03/21/2019, 4:15 PM

## 2019-04-03 NOTE — Progress Notes (Signed)
Patient ID: MAXFIELD OLBERA, male   DOB: 1945-09-16, 73 y.o.   MRN: [id number] S: Feeling better O:BP (!) 132/106 (BP Location: Right Arm)   Pulse 78   Temp 98.8 F (37.1 C) (Oral)   Resp 18   Ht 5' 10.5" (1.791 m)   Wt 93.8 kg   SpO2 97%   BMI 29.25 kg/m   Intake/Output Summary (Last 24 hours) at 03/29/2019 1344 Last data filed at 03/29/2019 1209 Gross per 24 hour  Intake 600 ml  Output 5400 ml  Net -4800 ml   Intake/Output: I/O last 3 completed shifts: In: 1387.1 [P.O.:838; I.V.:419; IV Piggyback:130] Out: 4851 [Urine:4850; Stool:1]  Intake/Output this shift:  Total I/O In: 360 [P.O.:360] Out: 1750 [Urine:1750] Weight change: -1.814 kg Gen: NAD CVS: no rub Resp: CTA Abd: +BS, soft, NT/ND Ext: 1+ bilateral lower ext edema  Recent Labs  Lab 03/21/2019 1822 03/28/19 0220 03/29/19 0703 03/29/19 1950 03/30/19 0216 03/31/19 0450 04/01/19 0534 04/02/19 0716 03/30/2019 0507  NA 134* 136 135 134* 134* 134* 137  136 138 140  K 4.1 4.3 4.3 4.4 4.2 4.1 4.0  4.0 3.5 3.4*  CL 99 100 106 106 105 104 109  108 109 111  CO2 22 22 14* 14* 15* 15* 14*  14* 16* 21*  GLUCOSE 136* 157* 139* 151* 161* 151* 119*  120* 126* 130*  BUN 35* 41* 62* 66* 69* 80* 86*  86* 82* 61*  CREATININE 1.64* 2.32* 4.31* 4.88* 5.13* 6.09* 6.00*  6.02* 4.39* 3.20*  ALBUMIN 4.3 3.9  --   --   --  3.0* 2.8* 2.8* 2.9*  CALCIUM 9.1 8.8* 7.0* 7.1* 7.0* 6.9* 6.9*  6.9* 7.0* 7.1*  PHOS  --   --   --   --   --  3.6 3.7  3.7 3.0 2.8  AST 23 28  --   --   --   --   --   --   --   ALT 19 19  --   --   --   --   --   --   --    Liver Function Tests: Recent Labs  Lab 04/06/2019 1822 03/28/19 0220  04/01/19 0534 04/02/19 0716 03/27/2019 0507  AST 23 28  --   --   --   --   ALT 19 19  --   --   --   --   ALKPHOS 85 78  --   --   --   --   BILITOT 1.1 1.3*  --   --   --   --   PROT 7.1 6.5  --   --   --   --   ALBUMIN 4.3 3.9   < > 2.8* 2.8* 2.9*   < > = values in this interval not displayed.   No  results for input(s): LIPASE, AMYLASE in the last 168 hours. No results for input(s): AMMONIA in the last 168 hours. CBC: Recent Labs  Lab 03/29/19 1950 03/30/19 0216 04/01/19 0534 04/02/19 0716 04/08/2019 0507  WBC 22.1* 20.9* 11.7* 10.2 10.4  NEUTROABS  --   --  10.8* 9.5* 8.5*  HGB 11.7* 10.9* 10.3* 10.6* 10.7*  HCT 39.3 35.1* 33.2* 32.9* 32.3*  MCV 95.6 92.4 90.7 88.9 87.8  PLT 127* 121* 150 172 186   Cardiac Enzymes: Recent Labs  Lab 04/01/19 0534 04/13/2019 0507  CKTOTAL 289 188   CBG: Recent Labs  Lab 03/28/19 1640 03/28/19 2134 03/29/19 [id number]  03/29/19 1206 03/29/19 1854  GLUCAP 150* 125* 143* 162* 133*    Iron Studies: No results for input(s): IRON, TIBC, TRANSFERRIN, FERRITIN in the last 72 hours. Studies/Results: No results found. Marland Kitchen aspirin EC  81 mg Oral Daily  . atorvastatin  80 mg Oral Daily  . calcitRIOL  0.5 mcg Oral Once per day on Tue Thu Sat   And  . calcitRIOL  1 mcg Oral Once per day on Sun Mon Wed Fri  . Chlorhexidine Gluconate Cloth  6 each Topical Daily  . furosemide  40 mg Intravenous Daily  . influenza vaccine adjuvanted  0.5 mL Intramuscular Tomorrow-1000  . mouth rinse  15 mL Mouth Rinse BID  . mycophenolate  720 mg Oral BID  . pneumococcal 23 valent vaccine  0.5 mL Intramuscular Tomorrow-1000  . sodium bicarbonate  1,300 mg Oral BID  . tamsulosin  0.4 mg Oral Daily  . warfarin  5 mg Oral ONCE-1800  . Warfarin - Pharmacist Dosing Inpatient   Does not apply q1800    BMET    Component Value Date/Time   NA 140 03/23/2019 0507   NA 136 (A) 12/31/2018   K 3.4 (L) 03/29/2019 0507   K 5.3 11/17/2013   CL 111 03/21/2019 0507   CL 100 11/17/2013   CO2 21 (L) 04/15/2019 0507   GLUCOSE 130 (H) 03/30/2019 0507   BUN 61 (H) 03/24/2019 0507   BUN 22 (A) 12/31/2018   CREATININE 3.20 (H) 04/05/2019 0507   CREATININE 1.34 11/17/2013   CALCIUM 7.1 (L) 03/28/2019 0507   CALCIUM 10.0 11/17/2013   GFRNONAA 18 (L) 03/23/2019 0507   GFRNONAA 54  11/17/2013   GFRAA 21 (L) 03/19/2019 0507   GFRAA 62 11/17/2013   CBC    Component Value Date/Time   WBC 10.4 04/06/2019 0507   RBC 3.68 (L) 04/17/2019 0507   HGB 10.7 (L) 03/24/2019 0507   HGB 12.9 (L) 08/15/2017 0949   HCT 32.3 (L) 03/24/2019 0507   HCT 37.9 08/15/2017 0949   HCT 44 11/17/2013   PLT 186 04/08/2019 0507   PLT 206 08/15/2017 0949   MCV 87.8 04/18/2019 0507   MCV 86 08/15/2017 0949   MCH 29.1 04/05/2019 0507   MCHC 33.1 04/05/2019 0507   RDW 14.4 04/08/2019 0507   RDW 14.7 08/15/2017 0949   RDW 14.2 11/17/2013   LYMPHSABS 0.9 04/09/2019 0507   MONOABS 0.4 04/08/2019 0507   EOSABS 0.4 04/01/2019 0507   EOSABS 0 11/17/2013   BASOSABS 0.1 03/26/2019 0507   BASOSABS 1 11/17/2013      Assessment/Plan:  1. AKI/CKD stage 3 presumably due to ischemic ATN in setting of volume depletion and hypotension. Remains oliguric despite IVF's, but UOP has picked up to 375 today. No indication for dialysis but will need to continue to monitor closely 1. Restartedlasix10/13/20due to edema 2. Cr continues to improve after reaching a peak of 6.09 and now down to 3.2. 3. No indication for HD at this time 4. Continue to follow. 2. Sepsis with hypotension- blood cultures negative to date, TTE with oscillating density in the mitral valve and awaiting TEE but INR too high yesterday.  Currently on dapto and ceftriaxone per ID. 1. He should not have PICC given his CKD and recommend central, tunneled catheter for prolonged antibiotic therapy (central picc by IR not PICC by IV team) 3. Diarrhea- possibly due to cellcept but with leukocytosis and hypotension,concern forinfectious etiology, however gi pathogen panel not performed for unclear reasons. Will  change to myfortic. 4. Metabolic acidosis- bicarb and follow 5. Hematuria- may be due to foley.  Will d/c foley and follow. 6. 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  Donetta Potts, MD [employer] [phone]

## 2019-04-03 NOTE — CV Procedure (Signed)
    TRANSESOPHAGEAL ECHOCARDIOGRAM   NAME:  John Barton   MRN: [id number] DOB:  Sep 01, 1945   ADMIT DATE: 04/07/2019  INDICATIONS: Endocarditis evaluation  PROCEDURE:   Informed consent was obtained prior to the procedure. The risks, benefits and alternatives for the procedure were discussed and the patient comprehended these risks.  Risks include, but are not limited to, cough, sore throat, vomiting, nausea, somnolence, esophageal and stomach trauma or perforation, bleeding, low blood pressure, aspiration, pneumonia, infection, trauma to the teeth and death.    Procedural time out performed. The oropharynx was anesthetized with topical 1% benzocaine.    During this procedure the patient is administered a total of Versed 4 mg and Fentanyl 50 mcg to achieve and maintain moderate conscious sedation.  The patient's heart rate, blood pressure, and oxygen saturation are monitored continuously during the procedure. The period of conscious sedation is 21 minutes, of which I was present face-to-face 100% of this time.   The transesophageal probe was inserted in the esophagus and stomach without difficulty and multiple views were obtained.    COMPLICATIONS:    There were no immediate complications.  FINDINGS:  LEFT VENTRICLE: EF = 45-50%  RIGHT VENTRICLE: Normal size and function.   LEFT ATRIUM: No thrombus/mass.  LEFT ATRIAL APPENDAGE: No thrombus/mass.   RIGHT ATRIUM: No thrombus/mass.  AORTIC VALVE:  S/p mechanical AVR.  No regurgitation. No vegetation.  MITRAL VALVE:    Normal structure. Mild-moderate regurgitation. No vegetation.  TRICUSPID VALVE: Normal structure. Mild regurgitation. No vegetation.  PULMONIC VALVE: Grossly normal structure. No regurgitation. No apparent vegetation.  INTERATRIAL SEPTUM: No PFO or ASD seen by color Doppler.  PERICARDIUM: No effusion noted.  DESCENDING AORTA: Moderate diffuse plaque seen   CONCLUSION: No vegetation seen  Oswaldo Milian MD [facility]  [address], [place], [place] [address] [phone]   6:12 PM

## 2019-04-04 DIAGNOSIS — N186 End stage renal disease: Secondary | ICD-10-CM

## 2019-04-04 DIAGNOSIS — R011 Cardiac murmur, unspecified: Secondary | ICD-10-CM

## 2019-04-04 DIAGNOSIS — Z992 Dependence on renal dialysis: Secondary | ICD-10-CM

## 2019-04-04 DIAGNOSIS — I509 Heart failure, unspecified: Secondary | ICD-10-CM

## 2019-04-04 DIAGNOSIS — Z79899 Other long term (current) drug therapy: Secondary | ICD-10-CM

## 2019-04-04 LAB — CBC WITH DIFFERENTIAL/PLATELET
Abs Immature Granulocytes: 0.77 10*3/uL — ABNORMAL HIGH (ref 0.00–0.07)
Basophils Absolute: 0 10*3/uL (ref 0.0–0.1)
Basophils Relative: 0 %
Eosinophils Absolute: 0.2 10*3/uL (ref 0.0–0.5)
Eosinophils Relative: 2 %
HCT: 32.9 % — ABNORMAL LOW (ref 39.0–52.0)
Hemoglobin: 11 g/dL — ABNORMAL LOW (ref 13.0–17.0)
Immature Granulocytes: 7 %
Lymphocytes Relative: 7 %
Lymphs Abs: 0.8 10*3/uL (ref 0.7–4.0)
MCH: 29.3 pg (ref 26.0–34.0)
MCHC: 33.4 g/dL (ref 30.0–36.0)
MCV: 87.7 fL (ref 80.0–100.0)
Monocytes Absolute: 0.8 10*3/uL (ref 0.1–1.0)
Monocytes Relative: 7 %
Neutro Abs: 8.3 10*3/uL — ABNORMAL HIGH (ref 1.7–7.7)
Neutrophils Relative %: 77 %
Platelets: 193 10*3/uL (ref 150–400)
RBC: 3.75 MIL/uL — ABNORMAL LOW (ref 4.22–5.81)
RDW: 14.7 % (ref 11.5–15.5)
WBC: 10.8 10*3/uL — ABNORMAL HIGH (ref 4.0–10.5)
nRBC: 0 % (ref 0.0–0.2)

## 2019-04-04 LAB — PROTIME-INR
INR: 1.4 — ABNORMAL HIGH (ref 0.8–1.2)
Prothrombin Time: 16.7 seconds — ABNORMAL HIGH (ref 11.4–15.2)

## 2019-04-04 LAB — MAGNESIUM: Magnesium: 1.8 mg/dL (ref 1.7–2.4)

## 2019-04-04 LAB — HEPARIN LEVEL (UNFRACTIONATED): Heparin Unfractionated: 0.55 IU/mL (ref 0.30–0.70)

## 2019-04-04 MED ORDER — MYCOPHENOLATE MOFETIL 250 MG PO CAPS
1000.0000 mg | ORAL_CAPSULE | Freq: Two times a day (BID) | ORAL | Status: DC
  Filled 2019-04-04: qty 4

## 2019-04-04 MED ORDER — WARFARIN SODIUM 10 MG PO TABS
10.0000 mg | ORAL_TABLET | Freq: Once | ORAL | Status: AC
  Administered 2019-04-04: 10 mg via ORAL
  Filled 2019-04-04: qty 1

## 2019-04-04 MED ORDER — POTASSIUM CHLORIDE CRYS ER 20 MEQ PO TBCR
40.0000 meq | EXTENDED_RELEASE_TABLET | Freq: Every day | ORAL | Status: DC
  Administered 2019-04-04 – 2019-04-06 (×3): 40 meq via ORAL
  Filled 2019-04-04 (×4): qty 2

## 2019-04-04 MED ORDER — MYCOPHENOLATE MOFETIL 500 MG PO TABS
1000.0000 mg | ORAL_TABLET | Freq: Two times a day (BID) | ORAL | Status: DC
  Administered 2019-04-04 – 2019-04-11 (×14): 1000 mg via ORAL
  Filled 2019-04-04 (×15): qty 2

## 2019-04-04 NOTE — Progress Notes (Signed)
Foley cathter removed. Voiding trial started.

## 2019-04-04 NOTE — Progress Notes (Signed)
ANTICOAGULATION CONSULT NOTE - Follow Up Consult  Pharmacy Consult for Heparin Indication: Afib, mechanical AVR  Allergies  Allergen Reactions  . Vancomycin     Acute renal failure and h/o renal transplant. DO NOT GIVE!    Patient Measurements: Height: 5' 10.5" (179.1 cm) Weight: 204 lb (92.5 kg)(scale c) IBW/kg (Calculated) : 74.15 Heparin dosing weight 86 kg  Vital Signs: Temp: 98.2 F (36.8 C) (10/17 0620) Temp Source: Oral (10/17 0620) BP: 156/84 (10/17 0621) Pulse Rate: 70 (10/17 0621)  Labs: Recent Labs    04/02/19 0716 04/02/2019 0507 03/20/2019 1723 04/04/19 0503  HGB 10.6* 10.7*  --  11.0*  HCT 32.9* 32.3*  --  32.9*  PLT 172 186  --  193  LABPROT 50.3* 17.1*  --  16.7*  INR 5.7* 1.4*  --  1.4*  HEPARINUNFRC  --   --  0.27* 0.55  CREATININE 4.39* 3.20*  --  2.14*  CKTOTAL  --  188  --   --     Estimated Creatinine Clearance: 34.4 mL/min (A) (by C-G formula based on SCr of 2.14 mg/dL (H)).  Assessment: 33 yom admitted 10/9 with INR of 2.4 on warfarin PTA (7 mg Tuesday and Thursday, 5 mg all other days per med rec). Patient has a mechanical aortic valve. Warfarin held on 10/13-10/15 and received 1x 5 mg dose of vitamin k on 10/15 so pt could get TEE. Cr today 2.14, trending down after AKI d/t hypotension.   INR today after 1 dose of 5 mg is subtherapeutic at 1.4. H&H is stable at 11.0/32.9, plts wnl. Patient is eating meals and has no noted severe drug interactions. Given higher INR goal and 1 x dose of vit. K, along with patient's improved renal function, will give 1.5 times patient's normal dose of 5 mg.   Heparin level today is therapeutic at 0.55 on 1500 units/hour.   Goal of Therapy:  INR goal: 2.5-3 Heparin level 0.3-0.7 units/ml Monitor platelets by anticoagulation protocol: Yes   Plan:  Continue heparin 1500 units / hr Warfarin 10 mg x 1 tonight  Daily heparin level, CBC, and INR  Monitor signs of bleeding    Thank you,   Eddie Candle,  PharmD PGY-1 Pharmacy Resident   Please check amion for clinical pharmacist contact number  04/04/2019,9:00 AM

## 2019-04-04 NOTE — Progress Notes (Signed)
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 Appetite has been slowly improving but ambulatory efforts have been poor due to LE swelling and multiple attached lines/monitors per pt. Fever curve, WBC & Cr trends, imaging, cx results, and ABX usage all independently reviewed.   Current Facility-Administered Medications:  .  0.9 %  sodium chloride infusion, , Intravenous, PRN, Donato Heinz, MD, Last Rate: 10 mL/hr at 04/04/19 1147, 250 mL at 04/04/19 1147 .  acetaminophen (TYLENOL) tablet 650 mg, 650 mg, Oral, Q6H PRN, 650 mg at 03/28/19 0715 **OR** acetaminophen (TYLENOL) suppository 650 mg, 650 mg, Rectal, Q6H PRN, Donato Heinz, MD .  aspirin EC tablet 81 mg, 81 mg, Oral, Daily, Donato Heinz, MD, 81 mg at 04/04/19 0820 .  atorvastatin (LIPITOR) tablet 80 mg, 80 mg, Oral, Daily, Donato Heinz, MD, 80 mg at 04/04/19 0818 .  calcitRIOL (ROCALTROL) capsule 0.5 mcg, 0.5 mcg, Oral, Once per day on Tue Thu Sat, 0.5 mcg at 04/04/19 1158 **AND** calcitRIOL (ROCALTROL) capsule 1 mcg, 1 mcg, Oral, Once per day on Sun Mon Wed Fri, Schumann, Christopher L, MD, 1 mcg at 04/13/2019 1201 .  cefTRIAXone (ROCEPHIN) 2 g in sodium chloride 0.9 % 100 mL IVPB, 2 g, Intravenous, Q24H, Donato Heinz, MD, Last Rate: 200 mL/hr at 04/04/19 1150, 2 g at 04/04/19 1150 .  Chlorhexidine Gluconate Cloth 2 % PADS 6 each, 6 each, Topical, Daily, Donato Heinz, MD, 6 each at 04/04/19 0820 .  DAPTOmycin (CUBICIN) 800 mg in sodium chloride 0.9 % IVPB, 800 mg, Intravenous, Q48H, Donato Heinz, MD, Stopped at 04/02/19 2040 .  diphenhydrAMINE (BENADRYL) capsule 25 mg, 25 mg, Oral, QHS PRN, Donato Heinz, MD, 25 mg at 03/22/2019 2204 .  furosemide (LASIX) injection 40 mg, 40 mg, Intravenous, Daily, Donato Heinz, MD, 40 mg at 04/04/19 0818 .  heparin ADULT infusion 100 units/mL (25000 units/269mL sodium chloride 0.45%), 1,500 Units/hr, Intravenous, Continuous, Donato Heinz, MD, Last Rate: 15 mL/hr at 04/04/19 0207, 1,500 Units/hr at 04/04/19 0207 .  influenza vaccine adjuvanted (FLUAD) injection 0.5 mL, 0.5 mL, Intramuscular, Tomorrow-1000, Dahal, Marlowe Aschoff, MD, Stopped at 03/29/19 1046 .  MEDLINE mouth rinse, 15 mL, Mouth Rinse, BID, Donato Heinz, MD, 15 mL at 04/04/19 0820 .  mycophenolate (CELLCEPT) capsule 1,000 mg, 1,000 mg, Oral, BID, Coladonato, Joseph, MD .  ondansetron (ZOFRAN) tablet 4 mg, 4 mg, Oral, Q6H PRN **OR** ondansetron (ZOFRAN) injection 4 mg, 4 mg, Intravenous, Q6H PRN, Donato Heinz, MD .  pneumococcal 23 valent vaccine (PNU-IMMUNE) injection 0.5 mL, 0.5 mL, Intramuscular, Tomorrow-1000, Dahal, Marlowe Aschoff, MD, Stopped at 03/29/19 1045 .  potassium chloride SA (KLOR-CON) CR tablet 40 mEq, 40 mEq, Oral, Daily, Kyle, Tyrone A, DO, 40 mEq at 04/04/19 0818 .  sodium bicarbonate tablet 1,300 mg, 1,300 mg, Oral, BID, Donato Heinz, MD, 1,300 mg at 04/04/19 0819 .  tamsulosin (FLOMAX) capsule 0.4 mg, 0.4 mg, Oral, Daily, Donato Heinz, MD, 0.4 mg at 04/04/19 0819 .  warfarin (COUMADIN) tablet 10 mg, 10 mg, Oral, ONCE-1800, Kyle, Tyrone A, DO .  Warfarin - Pharmacist Dosing Inpatient, , Does not apply, q1800,  Donato Heinz, MD   Physical Exam:   Vitals:   04/04/19 0621 04/04/19 1225  BP: (!) 156/84 136/64  Pulse: 70 79  Resp:  18  Temp:  98.6 F (37 C)  SpO2:  96%   Physical Exam Gen: chronically ill-appearing, obese, NAD, A&Ox 3 Head: NCAT, no temporal wasting evident EENT: PERRL,  EOMI, MMM, adequate dentition Neck: supple, mild JVD CV: NRRR, I/VI SEM at LLSB Pulm: CTA bilaterally, no wheeze, rare retractions Abd: soft, obese, NTND, +BS Extrems: 2+ pitting bilateral LE edema, 1+ pulses Skin: no rashes, adequate skin turgor Neuro: CN II-XII grossly intact, no focal neurologic deficits appreciated, gait was not assessed, A&Ox 3   Review of Systems:  Review of Systems  Constitutional: Positive for fever and malaise/fatigue. Negative for chills and weight loss.  HENT: Negative for congestion, hearing loss, sinus pain and sore throat.   Eyes: Negative for blurred vision, photophobia and discharge.  Respiratory: Positive for shortness of breath. Negative for cough and hemoptysis.   Cardiovascular: Positive for leg swelling. Negative for chest pain, palpitations and orthopnea.  Gastrointestinal: Negative for abdominal pain, constipation, diarrhea, heartburn, nausea and vomiting.  Genitourinary: Negative for dysuria, flank pain, frequency and urgency.  Musculoskeletal: Negative for back pain, joint pain and myalgias.  Skin: Negative for itching and rash.  Neurological: Negative for tremors, seizures, weakness and headaches.  Endo/Heme/Allergies: Negative for polydipsia. Does not bruise/bleed easily.  Psychiatric/Behavioral: Negative for depression and substance abuse. The patient is not nervous/anxious and does not have insomnia.      Lab Results  Component Value Date   WBC 10.8 (H) 04/04/2019   HGB 11.0 (L) 04/04/2019   HCT 32.9 (L) 04/04/2019   MCV 87.7 04/04/2019   PLT 193 04/04/2019    Lab Results  Component Value Date   CREATININE 2.14 (H) 04/04/2019   BUN 39 (H) 04/04/2019   NA 141 04/04/2019   K 3.2 (L) 04/04/2019   CL 107 04/04/2019   CO2 23 04/04/2019    Lab Results  Component Value Date   ALT 19 03/28/2019   AST 28 03/28/2019   ALKPHOS 78 03/28/2019     Microbiology: Recent Results (from the past 240 hour(s))  SARS CORONAVIRUS 2 (TAT 6-24  HRS) Nasopharyngeal Nasopharyngeal Swab     Status: None   Collection Time: 03/30/2019  6:23 PM   Specimen: Nasopharyngeal Swab  Result Value Ref Range Status   SARS Coronavirus 2 NEGATIVE NEGATIVE Final    Comment: (NOTE) SARS-CoV-2 target nucleic acids are NOT DETECTED. The SARS-CoV-2 RNA is generally detectable in upper and lower respiratory specimens during the acute phase of infection. Negative results do not preclude SARS-CoV-2 infection, do not rule out co-infections with other pathogens, and should not be used as the sole basis for treatment or other patient management decisions. Negative results must be combined with clinical observations, patient history, and epidemiological information. The expected result is Negative. Fact Sheet for Patients: SugarRoll.be Fact Sheet for Healthcare Providers: https://www.woods-mathews.com/ This test is not yet approved or cleared by the [place] FDA and  has been authorized for detection and/or diagnosis of SARS-CoV-2 by FDA under an Emergency Use Authorization (EUA). This EUA will remain  in effect (meaning this test can be used) for the duration of the COVID-19 declaration under Section 56 4(b)(1) of the Act, 21 U.S.C. section 360bbb-3(b)(1), unless the authorization is terminated or revoked sooner. Performed at [facility], [place] [address], [place], [place] [address]   Culture,  blood (routine x 2)     Status: None   Collection Time: 03/26/2019  6:47 PM   Specimen: BLOOD RIGHT ARM  Result Value Ref Range Status   Specimen Description BLOOD RIGHT ARM  Final   Special Requests   Final    BOTTLES DRAWN AEROBIC AND ANAEROBIC Blood Culture adequate volume   Culture   Final    NO GROWTH 6 DAYS Performed at [facility], [address], [place], [place] [address]    Report Status 04/02/2019 FINAL  Final  Culture, blood (routine x 2)     Status: None   Collection Time: 03/30/2019  7:00  PM   Specimen: BLOOD RIGHT WRIST  Result Value Ref Range Status   Specimen Description BLOOD RIGHT WRIST  Final   Special Requests   Final    BOTTLES DRAWN AEROBIC AND ANAEROBIC Blood Culture results may not be optimal due to an excessive volume of blood received in culture bottles   Culture   Final    NO GROWTH 6 DAYS Performed at [facility], [place] [address] [place], [place] [address]    Report Status 04/02/2019 FINAL  Final  Urine culture     Status: Abnormal   Collection Time: 04/07/2019 11:15 PM   Specimen: Urine, Random  Result Value Ref Range Status   Specimen Description URINE, RANDOM  Final   Special Requests NONE  Final   Culture (A)  Final    <10,000 COLONIES/mL INSIGNIFICANT GROWTH Performed at [facility], [place] [address] [place], [place] [address]    Report Status 03/29/2019 FINAL  Final  MRSA PCR Screening     Status: None   Collection Time: 03/28/19  8:34 AM   Specimen: Nasal Mucosa; Nasopharyngeal  Result Value Ref Range Status   MRSA by PCR NEGATIVE NEGATIVE Final    Comment:        The GeneXpert MRSA Assay (FDA approved for NASAL specimens only), is one component of a comprehensive MRSA colonization surveillance program. It is not intended to diagnose MRSA infection nor to guide or monitor treatment for MRSA infections. Performed at [facility], [place] [address], [place], [place] [address]   C difficile quick scan w PCR reflex     Status: None   Collection Time: 03/29/19  2:47 AM   Specimen: STOOL  Result Value Ref Range Status   C Diff antigen NEGATIVE NEGATIVE Final   C Diff toxin NEGATIVE NEGATIVE Final   C Diff interpretation No C. difficile detected.  Final    Comment: Performed at [facility], [place] [address] [place],  [address]  GI pathogen panel by PCR, stool     Status: None   Collection Time: 03/29/19  8:17 AM   Specimen: Stool  Result Value Ref Range Status   Plesiomonas shigelloides NOT PERFORMED  Final     Comment: Test not performed   Yersinia enterocolitica NOT PERFORMED  Final    Comment: Test not performed   Vibrio NOT PERFORMED  Final    Comment: Test not performed   Enteropathogenic E coli NOT PERFORMED  Final    Comment: Test not performed   E coli (ETEC) LT/ST NOT PERFORMED  Final    Comment: Test not performed   E coli 0157 by PCR NOT PERFORMED  Final    Comment: Test not performed   Cryptosporidium by PCR NOT PERFORMED  Final    Comment: Test not performed  Entamoeba histolytica NOT PERFORMED  Final    Comment: Test not performed   Adenovirus F 40/41 NOT PERFORMED  Final    Comment: Test not performed   Norovirus GI/GII NOT PERFORMED  Final    Comment: Test not performed   Sapovirus NOT PERFORMED  Final    Comment: (NOTE) Test not performed Performed At: [facility] [place], [place] [id number] Rush Farmer MD [id number]    Vibrio cholerae NOT PERFORMED  Final    Comment: Test not performed   Campylobacter by PCR [id number]  Final    Comment: (NOTE) The specimen submitted does not meet the laboratory's criteria for acceptability. Refer to [employer] [facility] Services for specimen acceptability criteria.      Required: ParaPak Orange      Received: Sterile Container      Mayra Neer was notified 03/30/2019.    Salmonella by PCR NOT PERFORMED  Final    Comment: Test not performed   E coli (STEC) NOT PERFORMED  Final    Comment: Test not performed   Enteroaggregative E coli NOT PERFORMED  Final    Comment: Test not performed   Shigella by PCR NOT PERFORMED  Final    Comment: Test not performed   Cyclospora cayetanensis NOT PERFORMED  Final    Comment: Test not performed   Astrovirus NOT PERFORMED  Final    Comment: Test not performed   G lamblia by PCR NOT PERFORMED  Final    Comment: Test not performed   Rotavirus A by PCR NOT PERFORMED  Final    Comment: Test not performed CALLED TO L.KING,RN AT 1732 03/30/19 BY L.PITT  Performed  at [facility], [place] [address] [place], [place] [address]     Impression/Plan: Patient is a 73 year old white male with end-stage renal disease status post renal transplant on chronic immunosuppression and history of aortic valve replacement presenting with a fever, chills, and questionable mitral valve endocarditis/vegetation.  1.  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  While his TEE would be viewed as more sensitive, he does have a mild to moderate regurgitation to this valve and has the presence of an aortic valve replacement.  Fortunately, his AVR does not show evidence of infection.  Given the risk of not treating the patient, it would likely be of best benefit to continue his daptomycin and Rocephin for a minimum of 4 weeks and most likely 6 weeks of treatment, especially as the patient does have an ICD in addition to his AVR.  Will defer placement of PICC line and duration of treatment to Dr. Graylon Good upon her return on Monday.  Blood cultures obtained thus far remain unrevealing.  Recall that the patient did initially have an elevated temperature of 100.2 and had pronounced leukocytosis near 35,000 prior to the initiation of antibiotics.  Both his fever curve and his white blood cell count trends have improved with ongoing treatment.  2.  ESRD/renal transplant -patient's creatinine today is 2.14.  Per notes reviewed, the patient did develop acute renal failure early during this hospitalization (questionably due to vancomycin load) with peak creatinine of a 6.09 four days ago.  Labs from April and July of this year show a creatinine of 1.3-1.4 appears to be his baseline.  We will dose the patient's antibiotics for creatinine clearance of 34.4.  I will touch base with pharmacy as this would be  in line more with daily dosing of his daptomycin rather than every 48 hour dosing but I am  uncertain if they are calculating his creatinine clearance based on ideal or actual body weight.  3. Immunosuppression -although the patient is many years out from his initial transplant, his immunosuppressive regimen is rather peculiar as he takes Myfortic 720 mg p.o. twice daily as an outpatient.  While here, we have substituted mycophenolate 1000 mg p.o. twice daily.  Given his recent fluctuations in renal function, I would encourage that the pharmacy staff reach out to Dr. Idelia Salm the patient's nephrologist to clarify his immunosuppressive regimen as I suspect that he may also require low-dose of either prednisone or either cyclosporine or tacrolimus as well.

## 2019-04-04 NOTE — Progress Notes (Signed)
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  Patient presented to the ED on 10/9 with sudden onset of fever, chills and tremors associated with right-sided chest pain at home.  In the ED, patient had a temperature 100.2, blood pressure was initially normal and later trended down as low as 86/57. Required low-flow oxygen supply to maintain O2 sat above 90%. Work-up in the ED showed n WBC count elevated to 21.4, hemoglobin 12.8, platelet 163, sodium 134, BUN/creatinine 35/1.64, INR therapeutic at 2.4, lactic acid level normal COVID-19 test negative. Urinalysis showed cloudy urine with WBC >50 and rare bacteria. Chest x-ray showed streaky airspace opacities in the left lung base possibly consolidation. CT abdomen and pelvis showed a 5.1 cm infrarenal abdominal aortic aneurysm. Also left lower quadrant renal transplant with mild perinephric stranding no hydronephrosis. 1.8 cm bladder calculus.  Patient initiated on IV antibiotics and is was admitted for further evaluation and management.  Over the course of last 3 days, patient has had unstable blood pressure down as low as 60s. Because of compromised renal perfusion, he has had AKI with significant rise in creatinine which is still ongoing. He was transferred to ICU on 10/11, briefly required pressors and transfer out on 10/12.  10/15: Per nursing, pt threatening to leave AMA 10/16: In a better mood today. Eager to get TEE done. Denies complaints. 10/17: TEE negative for vegetation. ?Cx-neg endocarditis?   Assessment & Plan:    Principal Problem:   Endocarditis Active Problems:   H/O prosthetic aortic valve replacement   Hx of CABG   Renal transplant recipient   Sepsis secondary to UTI [facility])   Community acquired pneumonia   AKI (acute kidney injury) ([place])   Fever   Sepsis with hypotension  - Met sepsis criteria on admission with leukocytosis, fever and potential source.  - Bld Cx did not show any growth. UCx showed insignificant growth of less than 10,000 CFU per mL.  - TTE showed a possible oscillating density in the mitral valve. Scheduled for TEE but unable to do d/t elevated INR - ID consultation appreciated. Currently on IV Rocephin and IV daptomycin.  - BPs improved - presumed source is endocarditis; however, INR is elevated so unable to do TEE right now; will give vit K today and see how well he reverses; coumadin is per pharmacy, currently held     - INR appropriate for TEE today; scheduled for this afternoon; continue current Tx for now     - 10/17: TEE negative for vegetation     - will need central tunneled cath by IR for long duration abx (not picc)  Acute kidney injury on a transplant kidney Metabolic acidosis - Renal transplant in 1998. Follows up with John Barton.  - Baseline creatinine 1.3-1.4.  - Patient presented with creatinine elevated to 1.64, steadily worsening, peaked at 6.09; it is 3.20 today. - Fluid balance +9.7 L. UOP ok. - NaHCO3 1380m BID; Bicarb is 16 - Immunosuppression changed from CellCept to mycophenolate by nephrology. - If no further improvement in creatinine, patient may end up requiring dialysis. -  nephrology onboard, appreciate assistance     - 10/17: Renal function improving; renal has s/o'd   Diarrhea - resolved  - Patient had few episodes of greenish fluids bowel movement in first 2 days of hospitalization which he states is because of brown-colored CellCept that he has been getting in  the hospital. Once the pill was switched, diarrhea stopped. - denies diarrhea prior to admission   Type 2 diabetes mellitus - A1c 6.6 in 2018 - glucose ok  Mechanical aortic valve Supratherapeutic INR - On Coumadin, INR 2.4 on presentation. Pharmacy to dose Coumadin to maintain INR between 2.5-3. - INR 5.7 today - urine is blood tinged - will add OT dose vit K     - 10/16: INR 1.4 today; heparin gtt/coumadin bridge     - 10/17: INR still 1.4; continuing heparin gtt/coumadin bridge  CAD s/p CABG 1995 Elevated troponin - Troponin was slightly elevated at 21 on admission. Currently not complaining of chest pain. Continue to monitor. - Home meds include aspirin, statin; continue  Combined systolic and diastolic CHF  History of hypertension - Echo 10/10, EF 45 to 50%, unchanged. - Home meds include Lasix 40 mg daily, lisinopril 5 mg daily. lisinopril hold because of hypotension and AKI. - currently on lasix 43m IV; monitor   Sinus bradycardia  - s/p loop recorder  BPH  - continue Flomax.  Hypokalemia     - give K+; monitor  DVT prophylaxis:heparin gtt/coumadin Code Status:FULL Disposition Plan:TBD  Consultants:  ID  Cardiology  Nephrolo  Antimicrobials:  Dapto, rocephin  ROS:  Denies dyspnea, CP, N, V. Remainder 10-pt ROS is negative for all not previously mentioned.  Subjective: "I heard they didn't find anything."  Objective: Vitals:   03/29/2019 2002 03/29/2019 2341 04/04/19 0620 04/04/19 0621  BP: 117/75 127/69 (!) 142/110 (!) 156/84  Pulse: 63 84 73 70  Resp: 16 14 16    Temp: 98.7 F (37.1 C) 98.4 F (36.9 C) 98.2 F (36.8 C)   TempSrc: Oral Oral Oral   SpO2: 99% 96% 97%   Weight:    92.5 kg  Height:        Intake/Output Summary (Last 24 hours) at 04/04/2019 0740 Last data filed at 04/04/2019 0649 Gross per 24 hour  Intake 2019.33 ml  Output 2900 ml  Net -880.67 ml    Filed Weights   04/02/19 0000 04/15/2019 0313 04/04/19 0621  Weight: 95.6 kg 93.8 kg 92.5 kg    Examination:  General:73 y.o.maleresting in chairin NAD Cardiovascular: RRR, +S1, S2, no m/g/r, equal pulses throughout Respiratory: CTABL, no w/r/r GI: BS+, NDNT, no masses noted MSK: No e/c/c; good ROM Neuro: Alert to name, follows commands Psyc: Appropriate interaction and affect, calm/cooperative   Data Reviewed: I have personally reviewed following labs and imaging studies.  CBC: Recent Labs  Lab 03/29/19 0703  03/30/19 0216 04/01/19 0534 04/02/19 0716 04/02/2019 0507 04/04/19 0503  WBC 24.0*   < > 20.9* 11.7* 10.2 10.4 10.8*  NEUTROABS 22.3*  --   --  10.8* 9.5* 8.5* 8.3*  HGB 10.2*   < > 10.9* 10.3* 10.6* 10.7* 11.0*  HCT 33.8*   < > 35.1* 33.2* 32.9* 32.3* 32.9*  MCV 93.6   < > 92.4 90.7 88.9 87.8 87.7  PLT 126*   < > 121* 150 172 186 193   < > = values in this interval not displayed.   Basic Metabolic Panel: Recent Labs  Lab 03/29/19 0703  03/30/19 0216 03/31/19 0450 04/01/19 0534  04/02/19 0716 03/20/2019 0507 04/04/19 0503  NA 135   < > 134* 134* 137  136 138 140 141  K 4.3   < > 4.2 4.1 4.0  4.0 3.5 3.4* 3.2*  CL 106   < > 105 104 109  108 109 111 107  CO2 14*   < > 15* 15* 14*  14* 16* 21* 23  GLUCOSE 139*   < > 161* 151* 119*  120* 126* 130* 126*  BUN 62*   < > 69* 80* 86*  86* 82* 61* 39*  CREATININE 4.31*   < > 5.13* 6.09* 6.00*  6.02* 4.39* 3.20* 2.14*  CALCIUM 7.0*   < > 7.0* 6.9* 6.9*  6.9* 7.0* 7.1* 7.3*  MG 1.4*  --  2.2  --  2.2  --  2.1 1.8  PHOS  --   --   --  3.6 3.7  3.7 3.0 2.8 2.7   < > = values in this interval not displayed.   GFR: Estimated Creatinine Clearance: 34.4 mL/min (A) (by C-G formula based on SCr of 2.14 mg/dL (H)). Liver Function Tests: Recent Labs  Lab 03/31/19 0450 04/01/19 0534 04/02/19 0716 03/26/2019 0507 04/04/19 0503  ALBUMIN 3.0* 2.8* 2.8* 2.9* 3.1*   No results for input(s): LIPASE, AMYLASE in the  last 168 hours. No results for input(s): AMMONIA in the last 168 hours. Coagulation Profile: Recent Labs  Lab 03/31/19 0450 04/01/19 0534 04/02/19 0716 04/02/2019 0507 04/04/19 0503  INR 4.5* 6.6* 5.7* 1.4* 1.4*   Cardiac Enzymes: Recent Labs  Lab 04/01/19 0534 03/26/2019 0507  CKTOTAL 289 188   BNP (last 3 results) No results for input(s): PROBNP in the last 8760 hours. HbA1C: No results for input(s): HGBA1C in the last 72 hours. CBG: Recent Labs  Lab 03/28/19 1640 03/28/19 2134 03/29/19 0649 03/29/19 1206 03/29/19 1854  GLUCAP 150* 125* 143* 162* 133*   Lipid Profile: No results for input(s): CHOL, HDL, LDLCALC, TRIG, CHOLHDL, LDLDIRECT in the last 72 hours. Thyroid Function Tests: No results for input(s): TSH, T4TOTAL, FREET4, T3FREE, THYROIDAB in the last 72 hours. Anemia Panel: No results for input(s): VITAMINB12, FOLATE, FERRITIN, TIBC, IRON, RETICCTPCT in the last 72 hours. Sepsis Labs: Recent Labs  Lab 03/29/19 1950 03/30/19 0932  PROCALCITON  --  28.50  LATICACIDVEN 0.9  --     Recent Results (from the past 240 hour(s))  SARS CORONAVIRUS 2 (TAT 6-24 HRS) Nasopharyngeal Nasopharyngeal Swab     Status: None   Collection Time: 04/06/2019  6:23 PM   Specimen: Nasopharyngeal Swab  Result Value Ref Range Status   SARS Coronavirus 2 NEGATIVE NEGATIVE Final    Comment: (NOTE) SARS-CoV-2 target nucleic acids are NOT DETECTED. The SARS-CoV-2 RNA is generally detectable in upper and lower respiratory specimens during the acute phase of infection. Negative results do not preclude SARS-CoV-2 infection, do not rule out co-infections with other pathogens, and should not be used as the sole basis for treatment or other patient management decisions. Negative results must be combined with clinical observations, patient history, and epidemiological information. The expected result is Negative. Fact Sheet for Patients: SugarRoll.be Fact  Sheet for Healthcare Providers: https://www.woods-mathews.com/ This test is not yet approved or cleared by the [place] FDA and  has been authorized for detection and/or diagnosis of SARS-CoV-2 by FDA under an Emergency Use Authorization (EUA). This EUA will remain  in effect (meaning this test can be used) for the duration of the COVID-19 declaration under Section 56  4(b)(1) of the Act, 21 U.S.C. section 360bbb-3(b)(1), unless the authorization is terminated or revoked sooner. Performed at [facility], [place] [address] [place], [place] [address]   Culture, blood (routine x 2)     Status: None   Collection Time: 04/15/2019  6:47 PM   Specimen: BLOOD RIGHT ARM  Result Value Ref Range Status   Specimen Description BLOOD RIGHT ARM  Final   Special Requests   Final    BOTTLES DRAWN AEROBIC AND ANAEROBIC Blood Culture adequate volume   Culture   Final    NO GROWTH 6 DAYS Performed at [facility], [place] [address] [place], [place] [address]    Report Status 04/02/2019 FINAL  Final  Culture, blood (routine x 2)     Status: None   Collection Time: 03/21/2019  7:00 PM   Specimen: BLOOD RIGHT WRIST  Result Value Ref Range Status   Specimen Description BLOOD RIGHT WRIST  Final   Special Requests   Final    BOTTLES DRAWN AEROBIC AND ANAEROBIC Blood Culture results may not be optimal due to an excessive volume of blood received in culture bottles   Culture   Final    NO GROWTH 6 DAYS Performed at [facility], [place] [address], [place], [place] [address]    Report Status 04/02/2019 FINAL  Final  Urine culture     Status: Abnormal   Collection Time: 03/26/2019 11:15 PM   Specimen: Urine, Random  Result Value Ref Range Status   Specimen Description URINE, RANDOM  Final   Special Requests NONE  Final   Culture (A)  Final    <10,000 COLONIES/mL INSIGNIFICANT GROWTH Performed at [facility], [place] [address], [place], [place] [address]    Report Status  03/29/2019 FINAL  Final  MRSA PCR Screening     Status: None   Collection Time: 03/28/19  8:34 AM   Specimen: Nasal Mucosa; Nasopharyngeal  Result Value Ref Range Status   MRSA by PCR NEGATIVE NEGATIVE Final    Comment:        The GeneXpert MRSA Assay (FDA approved for NASAL specimens only), is one component of a comprehensive MRSA colonization surveillance program. It is not intended to diagnose MRSA infection nor to guide or monitor treatment for MRSA infections. Performed at [facility], [place] [address] [place], [place] [address]   C difficile quick scan w PCR reflex     Status: None   Collection Time: 03/29/19  2:47 AM   Specimen: STOOL  Result Value Ref Range Status   C Diff antigen NEGATIVE NEGATIVE Final   C Diff toxin NEGATIVE NEGATIVE Final   C Diff interpretation No C. difficile detected.  Final    Comment: Performed at [facility], [place] [address] [place],  [address]  GI pathogen panel by PCR, stool     Status: None   Collection Time: 03/29/19  8:17 AM   Specimen: Stool  Result Value Ref Range Status   Plesiomonas shigelloides NOT PERFORMED  Final    Comment: Test not performed   Yersinia enterocolitica NOT PERFORMED  Final    Comment: Test not performed   Vibrio NOT PERFORMED  Final    Comment: Test not performed   Enteropathogenic E coli NOT PERFORMED  Final    Comment: Test not performed   E coli (ETEC) LT/ST NOT PERFORMED  Final    Comment: Test not performed   E coli  0157 by PCR NOT PERFORMED  Final    Comment: Test not performed   Cryptosporidium by PCR NOT PERFORMED  Final    Comment: Test not performed   Entamoeba histolytica NOT PERFORMED  Final    Comment: Test not performed   Adenovirus F 40/41 NOT PERFORMED  Final    Comment: Test not performed   Norovirus GI/GII NOT PERFORMED  Final    Comment: Test not performed   Sapovirus NOT PERFORMED  Final    Comment: (NOTE) Test not performed Performed At: [facility]  [place], [place] [id number] Rush Farmer MD AQ:[id number]    Vibrio cholerae NOT PERFORMED  Final    Comment: Test not performed   Campylobacter by PCR TGY5  Final    Comment: (NOTE) The specimen submitted does not meet the laboratory's criteria for acceptability. Refer to [employer] [facility] [employer] for specimen acceptability criteria.      Required: ParaPak Orange      Received: Sterile Container      Mayra Neer was notified 03/30/2019.    Salmonella by PCR NOT PERFORMED  Final    Comment: Test not performed   E coli (STEC) NOT PERFORMED  Final    Comment: Test not performed   Enteroaggregative E coli NOT PERFORMED  Final    Comment: Test not performed   Shigella by PCR NOT PERFORMED  Final    Comment: Test not performed   Cyclospora cayetanensis NOT PERFORMED  Final    Comment: Test not performed   Astrovirus NOT PERFORMED  Final    Comment: Test not performed   G lamblia by PCR NOT PERFORMED  Final    Comment: Test not performed   Rotavirus A by PCR NOT PERFORMED  Final    Comment: Test not performed CALLED TO L.KING,RN AT 1732 03/30/19 BY L.PITT  Performed at [facility], [place] [address] [place], [place] [address]       Radiology Studies: No results found.   Scheduled Meds: . aspirin EC  81 mg Oral Daily  . atorvastatin  80 mg Oral Daily  . calcitRIOL  0.5 mcg Oral Once per day on Tue Thu Sat   And  . calcitRIOL  1 mcg Oral Once per day on Sun Mon Wed Fri  . Chlorhexidine Gluconate Cloth  6 each Topical Daily  . furosemide  40 mg Intravenous Daily  . influenza vaccine adjuvanted  0.5 mL Intramuscular Tomorrow-1000  . mouth rinse  15 mL Mouth Rinse BID  . mycophenolate  720 mg Oral BID  . pneumococcal 23 valent vaccine  0.5 mL Intramuscular Tomorrow-1000  . potassium chloride  40 mEq Oral Daily  . sodium bicarbonate  1,300 mg Oral BID  . tamsulosin  0.4 mg Oral Daily  . Warfarin - Pharmacist Dosing Inpatient   Does not apply q1800    Continuous Infusions: . sodium chloride Stopped (04/12/2019 1423)  . cefTRIAXone (ROCEPHIN)  IV Stopped (04/12/2019 1217)  . DAPTOmycin (CUBICIN)  IV Stopped (04/02/19 2040)  . heparin 1,500 Units/hr (04/04/19 0207)     LOS: 8 days    Time spent: 25 minutes spent in the coordination of care today.   Jonnie Finner, DO [facility] Pager [phone]  If 7PM-7AM, please contact night-coverage [email or web address] Password [email or web address] 04/04/2019, 7:40 AM

## 2019-04-04 NOTE — Plan of Care (Signed)
  Problem: Health Behavior/Discharge Planning: Goal: Ability to manage health-related needs will improve Outcome: Progressing   Problem: Education: Goal: Knowledge of General Education information will improve Description: Including pain rating scale, medication(s)/side effects and non-pharmacologic comfort measures Outcome: Progressing   

## 2019-04-04 NOTE — Progress Notes (Signed)
Patient ID: John Barton, male   DOB: 1945-10-03, 73 y.o.   MRN: [id number] S: Feels better and anxious to go home. O:BP (!) 156/84   Pulse 70   Temp 98.2 F (36.8 C) (Oral)   Resp 16   Ht 5' 10.5" (1.791 m)   Wt 92.5 kg Comment: scale c  SpO2 97%   BMI 28.86 kg/m   Intake/Output Summary (Last 24 hours) at 04/04/2019 1054 Last data filed at 04/04/2019 0802 Gross per 24 hour  Intake 1899.33 ml  Output 2900 ml  Net -1000.67 ml   Intake/Output: I/O last 3 completed shifts: In: 2019.3 [P.O.:1200; I.V.:503.3; IV Piggyback:316] Out: 4000 [Urine:4000]  Intake/Output this shift:  Total I/O In: 240 [P.O.:240] Out: -  Weight change: -1.27 kg Gen: NAD CVS: no rub Resp: CTA Abd: +BS, soft, NT Ext: 1+ edema  Recent Labs  Lab 03/29/19 1950 03/30/19 0216 03/31/19 0450 04/01/19 0534 04/02/19 0716 03/30/2019 0507 04/04/19 0503  NA 134* 134* 134* 137  136 138 140 141  K 4.4 4.2 4.1 4.0  4.0 3.5 3.4* 3.2*  CL 106 105 104 109  108 109 111 107  CO2 14* 15* 15* 14*  14* 16* 21* 23  GLUCOSE 151* 161* 151* 119*  120* 126* 130* 126*  BUN 66* 69* 80* 86*  86* 82* 61* 39*  CREATININE 4.88* 5.13* 6.09* 6.00*  6.02* 4.39* 3.20* 2.14*  ALBUMIN  --   --  3.0* 2.8* 2.8* 2.9* 3.1*  CALCIUM 7.1* 7.0* 6.9* 6.9*  6.9* 7.0* 7.1* 7.3*  PHOS  --   --  3.6 3.7  3.7 3.0 2.8 2.7   Liver Function Tests: Recent Labs  Lab 04/02/19 0716 04/07/2019 0507 04/04/19 0503  ALBUMIN 2.8* 2.9* 3.1*   No results for input(s): LIPASE, AMYLASE in the last 168 hours. No results for input(s): AMMONIA in the last 168 hours. CBC: Recent Labs  Lab 03/30/19 0216 04/01/19 0534 04/02/19 0716 04/09/2019 0507 04/04/19 0503  WBC 20.9* 11.7* 10.2 10.4 10.8*  NEUTROABS  --  10.8* 9.5* 8.5* 8.3*  HGB 10.9* 10.3* 10.6* 10.7* 11.0*  HCT 35.1* 33.2* 32.9* 32.3* 32.9*  MCV 92.4 90.7 88.9 87.8 87.7  PLT 121* 150 172 186 193   Cardiac Enzymes: Recent Labs  Lab 04/01/19 0534 04/15/2019 0507  CKTOTAL 289 188    CBG: Recent Labs  Lab 03/28/19 1640 03/28/19 2134 03/29/19 0649 03/29/19 1206 03/29/19 1854  GLUCAP 150* 125* 143* 162* 133*    Iron Studies: No results for input(s): IRON, TIBC, TRANSFERRIN, FERRITIN in the last 72 hours. Studies/Results: No results found. [place] Kitchen aspirin EC  81 mg Oral Daily  . atorvastatin  80 mg Oral Daily  . calcitRIOL  0.5 mcg Oral Once per day on Tue Thu Sat   And  . calcitRIOL  1 mcg Oral Once per day on Sun Mon Wed Fri  . Chlorhexidine Gluconate Cloth  6 each Topical Daily  . furosemide  40 mg Intravenous Daily  . influenza vaccine adjuvanted  0.5 mL Intramuscular Tomorrow-1000  . mouth rinse  15 mL Mouth Rinse BID  . mycophenolate  720 mg Oral BID  . pneumococcal 23 valent vaccine  0.5 mL Intramuscular Tomorrow-1000  . potassium chloride  40 mEq Oral Daily  . sodium bicarbonate  1,300 mg Oral BID  . tamsulosin  0.4 mg Oral Daily  . warfarin  10 mg Oral ONCE-1800  . Warfarin - Pharmacist Dosing Inpatient   Does not apply [phone]  BMET    Component Value Date/Time   NA 141 04/04/2019 0503   NA 136 (A) 12/31/2018   K 3.2 (L) 04/04/2019 0503   K 5.3 11/17/2013   CL 107 04/04/2019 0503   CL 100 11/17/2013   CO2 23 04/04/2019 0503   GLUCOSE 126 (H) 04/04/2019 0503   BUN 39 (H) 04/04/2019 0503   BUN 22 (A) 12/31/2018   CREATININE 2.14 (H) 04/04/2019 0503   CREATININE 1.34 11/17/2013   CALCIUM 7.3 (L) 04/04/2019 0503   CALCIUM 10.0 11/17/2013   GFRNONAA 29 (L) 04/04/2019 0503   GFRNONAA 54 11/17/2013   GFRAA 34 (L) 04/04/2019 0503   GFRAA 62 11/17/2013   CBC    Component Value Date/Time   WBC 10.8 (H) 04/04/2019 0503   RBC 3.75 (L) 04/04/2019 0503   HGB 11.0 (L) 04/04/2019 0503   HGB 12.9 (L) 08/15/2017 0949   HCT 32.9 (L) 04/04/2019 0503   HCT 37.9 08/15/2017 0949   HCT 44 11/17/2013   PLT 193 04/04/2019 0503   PLT 206 08/15/2017 0949   MCV 87.7 04/04/2019 0503   MCV 86 08/15/2017 0949   MCH 29.3 04/04/2019 0503   MCHC 33.4  04/04/2019 0503   RDW 14.7 04/04/2019 0503   RDW 14.7 08/15/2017 0949   RDW 14.2 11/17/2013   LYMPHSABS 0.8 04/04/2019 0503   MONOABS 0.8 04/04/2019 0503   EOSABS 0.2 04/04/2019 0503   EOSABS 0 11/17/2013   BASOSABS 0.0 04/04/2019 0503   BASOSABS 1 11/17/2013     Assessment/Plan:  1. AKI/CKD stage 3 presumably due to ischemic ATN in setting of volume depletion and hypotension.  1. Restartedlasix10/13/20due to edemaand diuresing well 2. Cr continues to improve after reaching a peak of 6.09 and now down to 2.14. 3. No indication for HD at this time 4. Nothing further to add.  Will sign off and have pt follow up with Dr. Jimmy Footman after discharge (had an appointment on 04/01/19 but will reschedule) 2. Sepsis with hypotension- blood cultures negative to date, TTE with oscillating density in the mitral valve and awaiting TEE negative for vegetation. 1. Currently on dapto and ceftriaxone per ID. 2. He should not have PICC given his CKD and recommend central, tunneled catheter for prolonged antibiotic therapy (central picc by IR not PICC by IV team) 3. Diarrhea- resolved. 4. Hypokalemia- will replete orally 5. Metabolic acidosis- bicarb and follow 6. Hematuria- may be due to foley. Will d/c foley and follow. 7. Renal transplant from sister only taking cellcept.changed to myfortic due to diarrhea but will change back to cellcept which is what he has been on for years. 8. Disposition- awaiting therapeutic INR before discharge.  Follow up with Dr. Jimmy Footman above.  Please call with any questions or concerns.   Donetta Potts, MD [employer] [phone]

## 2019-04-05 ENCOUNTER — Encounter (HOSPITAL_COMMUNITY): Payer: Self-pay | Admitting: Cardiology

## 2019-04-05 LAB — CBC
HCT: 32.3 % — ABNORMAL LOW (ref 39.0–52.0)
Hemoglobin: 10.5 g/dL — ABNORMAL LOW (ref 13.0–17.0)
MCH: 28.8 pg (ref 26.0–34.0)
MCHC: 32.5 g/dL (ref 30.0–36.0)
MCV: 88.5 fL (ref 80.0–100.0)
Platelets: 240 10*3/uL (ref 150–400)
RBC: 3.65 MIL/uL — ABNORMAL LOW (ref 4.22–5.81)
RDW: 14.6 % (ref 11.5–15.5)
WBC: 11.5 10*3/uL — ABNORMAL HIGH (ref 4.0–10.5)
nRBC: 0 % (ref 0.0–0.2)

## 2019-04-05 LAB — RENAL FUNCTION PANEL
Albumin: 3.1 g/dL — ABNORMAL LOW (ref 3.5–5.0)
Albumin: 3.1 g/dL — ABNORMAL LOW (ref 3.5–5.0)
Anion gap: 11 (ref 5–15)
Anion gap: 11 (ref 5–15)
BUN: 39 mg/dL — ABNORMAL HIGH (ref 8–23)
BUN: 25 mg/dL — ABNORMAL HIGH (ref 8–23)
CO2: 23 mmol/L (ref 22–32)
CO2: 25 mmol/L (ref 22–32)
Calcium: 7.3 mg/dL — ABNORMAL LOW (ref 8.9–10.3)
Calcium: 7.4 mg/dL — ABNORMAL LOW (ref 8.9–10.3)
Chloride: 107 mmol/L (ref 98–111)
Chloride: 105 mmol/L (ref 98–111)
Creatinine, Ser: 2.14 mg/dL — ABNORMAL HIGH (ref 0.61–1.24)
Creatinine, Ser: 1.6 mg/dL — ABNORMAL HIGH (ref 0.61–1.24)
GFR calc Af Amer: 34 mL/min — ABNORMAL LOW (ref >60)
GFR calc Af Amer: 48 mL/min — ABNORMAL LOW (ref >60)
GFR calc non Af Amer: 29 mL/min — ABNORMAL LOW (ref >60)
GFR calc non Af Amer: 42 mL/min — ABNORMAL LOW (ref >60)
Glucose, Bld: 126 mg/dL — ABNORMAL HIGH (ref 70–99)
Glucose, Bld: 124 mg/dL — ABNORMAL HIGH (ref 70–99)
Phosphorus: 2.7 mg/dL (ref 2.5–4.6)
Phosphorus: 2 mg/dL — ABNORMAL LOW (ref 2.5–4.6)
Potassium: 3.2 mmol/L — ABNORMAL LOW (ref 3.5–5.1)
Potassium: 3.3 mmol/L — ABNORMAL LOW (ref 3.5–5.1)
Sodium: 141 mmol/L (ref 135–145)
Sodium: 141 mmol/L (ref 135–145)

## 2019-04-05 LAB — GLUCOSE, CAPILLARY: Glucose-Capillary: 122 mg/dL — ABNORMAL HIGH (ref 70–99)

## 2019-04-05 LAB — HEPARIN LEVEL (UNFRACTIONATED)
Heparin Unfractionated: 0.82 IU/mL — ABNORMAL HIGH (ref 0.30–0.70)
Heparin Unfractionated: 0.53 IU/mL (ref 0.30–0.70)

## 2019-04-05 LAB — MAGNESIUM: Magnesium: 1.4 mg/dL — ABNORMAL LOW (ref 1.7–2.4)

## 2019-04-05 LAB — PROTIME-INR
INR: 1.4 — ABNORMAL HIGH (ref 0.8–1.2)
Prothrombin Time: 17.4 seconds — ABNORMAL HIGH (ref 11.4–15.2)

## 2019-04-05 MED ORDER — OXYCODONE-ACETAMINOPHEN 7.5-325 MG PO TABS
1.0000 | ORAL_TABLET | Freq: Four times a day (QID) | ORAL | Status: DC | PRN
  Administered 2019-04-05 – 2019-04-08 (×6): 1 via ORAL
  Filled 2019-04-05 (×6): qty 1

## 2019-04-05 MED ORDER — WARFARIN SODIUM 7.5 MG PO TABS
7.5000 mg | ORAL_TABLET | Freq: Once | ORAL | Status: AC
  Administered 2019-04-05: 7.5 mg via ORAL
  Filled 2019-04-05: qty 1

## 2019-04-05 MED ORDER — MAGNESIUM SULFATE 2 GM/50ML IV SOLN
2.0000 g | Freq: Once | INTRAVENOUS | Status: AC
  Administered 2019-04-05: 2 g via INTRAVENOUS
  Filled 2019-04-05: qty 50

## 2019-04-05 MED ORDER — SODIUM PHOSPHATES 45 MMOLE/15ML IV SOLN
30.0000 mmol | Freq: Once | INTRAVENOUS | Status: AC
  Administered 2019-04-05: 30 mmol via INTRAVENOUS
  Filled 2019-04-05: qty 10

## 2019-04-05 MED ORDER — SODIUM CHLORIDE 0.9 % IV SOLN
800.0000 mg | Freq: Every day | INTRAVENOUS | Status: DC
  Administered 2019-04-05 – 2019-04-06 (×2): 800 mg via INTRAVENOUS
  Filled 2019-04-05 (×4): qty 16

## 2019-04-05 NOTE — Progress Notes (Signed)
Patient was complaining of muscle pain to left groin area whenever he tried to move left leg. Patient said that he had this pain yesterday but it is worse today. Notified MD and got an order of percocet. Pt walked in hallway without any respiratory distress. Sustained 95% on RA with walking.Tele reported that pt had wide QRS at 13:17 Notified MD

## 2019-04-05 NOTE — Progress Notes (Signed)
ANTICOAGULATION CONSULT NOTE - Follow Up Consult  Pharmacy Consult for Heparin Indication: Afib, mechanical AVR  Allergies  Allergen Reactions  . Vancomycin     Acute renal failure and h/o renal transplant. DO NOT GIVE!    Patient Measurements: Height: 5' 10.5" (179.1 cm) Weight: 203 lb 4.8 oz (92.2 kg)(scale c) IBW/kg (Calculated) : 74.15 Heparin dosing weight 86 kg  Vital Signs: Temp: 98.7 F (37.1 C) (10/18 0509) Temp Source: Oral (10/18 0509) BP: 156/90 (10/18 0509) Pulse Rate: 96 (10/18 0509)  Labs: Recent Labs    04/11/2019 0507 04/05/2019 1723 04/04/19 0503 04/05/19 0601  HGB 10.7*  --  11.0* 10.5*  HCT 32.3*  --  32.9* 32.3*  PLT 186  --  193 240  LABPROT 17.1*  --  16.7* 17.4*  INR 1.4*  --  1.4* 1.4*  HEPARINUNFRC  --  0.27* 0.55 0.82*  CREATININE 3.20*  --  2.14* 1.60*  CKTOTAL 188  --   --   --     Estimated Creatinine Clearance: 45.9 mL/min (A) (by C-G formula based on SCr of 1.6 mg/dL (H)).  Assessment: 28 yom admitted 10/9 with INR of 2.4 on warfarin PTA (7 mg Tuesday and Thursday, 5 mg all other days per med rec). Patient has a mechanical aortic valve. Warfarin held on 10/13-10/15 and received 1x 5 mg dose of vitamin k on 10/15 so pt could get TEE. Cr today improving and is now 1.6  INR today is still subtherapeutic at 1.4. Expect to see a slight increase in INR tomorrow.   Heparin level today is supratherapeutic at 0.82. H&H is stable at 10.5/32.3, plts wnl.   Goal of Therapy:  INR goal: 2.5-3 Heparin level 0.3-0.7 units/ml Monitor platelets by anticoagulation protocol: Yes   Plan:  Decrease heparin infusion to 1350 units / hr  Warfarin 7.5 mg x 1 tonight  6 hour heparin level to confirm within range after dose dec.  Daily heparin level, CBC, and INR  Monitor signs of bleeding     Thank you,   Eddie Candle, PharmD PGY-1 Pharmacy Resident   Please check amion for clinical pharmacist contact number  04/05/2019,8:50 AM

## 2019-04-05 NOTE — Progress Notes (Signed)
Pharmacy Antibiotic Note  John Barton is a 73 y.o. male admitted on 03/28/2019 with culture negative endocarditis.  Pharmacy has been consulted for daptomycin dosing. Patient has a history of kidney transplant and is currently immunosuppressed with Cellcept.   10/10 TTE showed possible vegetation on mitral valve with no growth on blood cultures and possible oscillating density in the mitral valve but the 10/16 TEE  is neg for vegetation.   Today patient is afebrile with slightly increased WBC of 11.5. CK is wnl at 188 on 10/16. Patient is recovering from AKI with creatinine continuing to trend down, today at 1.60. With continued recovery of renal function, will decrease dosing interval of daptomycin.   Per ID, will treat for 4-6 weeks due to patient's ICD, AVR, and conflicting echo results.   Antimicrobials this admission: Dapto 10/13> Ceftriaxone 10/13>> Vancomycin 10/09 >> 10/11 Cefepime 10/09 >> 10/13 Flagyl 10/09 X 1  Dose adjustments this admission: daptomycin q48 > q24 hours   Microbiology results: 10/11 Cdiff: neg 10/10 MRSA PCR: neg 10/9: Ucx: insignificant growth 10/09 BCx: negative 10/09 COVID: neg  Plan: Change daptomycin to 800 mg (~8 mg/kg) IV every 24 hours Continue ceftriaxone 2 grams IV every 24 hours   Height: 5' 10.5" (179.1 cm) Weight: 203 lb 4.8 oz (92.2 kg)(scale c) IBW/kg (Calculated) : 74.15  Temp (24hrs), Avg:98.7 F (37.1 C), Min:98.6 F (37 C), Max:98.9 F (37.2 C)  Recent Labs  Lab 03/29/19 1950  04/01/19 0534 04/02/19 0716 04/05/2019 0507 04/04/19 0503 04/05/19 0601  WBC 22.1*   < > 11.7* 10.2 10.4 10.8* 11.5*  CREATININE 4.88*   < > 6.00*  6.02* 4.39* 3.20* 2.14* 1.60*  LATICACIDVEN 0.9  --   --   --   --   --   --    < > = values in this interval not displayed.    Estimated Creatinine Clearance: 45.9 mL/min (A) (by C-G formula based on SCr of 1.6 mg/dL (H)).    Allergies  Allergen Reactions  . Vancomycin     Acute renal failure  and h/o renal transplant. DO NOT GIVE!    Thank you for allowing pharmacy to be a part of this patient's care.  Maryan Char Jayshon Dommer 04/05/2019 9:02 AM

## 2019-04-05 NOTE — Progress Notes (Signed)
ANTICOAGULATION CONSULT NOTE - Follow Up Consult  Pharmacy Consult for Heparin Indication: Afib, mechanical AVR  Patient Measurements: Height: 5' 10.5" (179.1 cm) Weight: 203 lb 4.8 oz (92.2 kg)(scale c) IBW/kg (Calculated) : 74.15 Heparin dosing weight 86 kg  Vital Signs: Temp: 98.2 F (36.8 C) (10/18 1253) Temp Source: Oral (10/18 1253) BP: 136/69 (10/18 1253) Pulse Rate: 70 (10/18 1253)  Labs: Recent Labs    03/31/2019 0507  04/04/19 0503 04/05/19 0601 04/05/19 1528  HGB 10.7*  --  11.0* 10.5*  --   HCT 32.3*  --  32.9* 32.3*  --   PLT 186  --  193 240  --   LABPROT 17.1*  --  16.7* 17.4*  --   INR 1.4*  --  1.4* 1.4*  --   HEPARINUNFRC  --    < > 0.55 0.82* 0.53  CREATININE 3.20*  --  2.14* 1.60*  --   CKTOTAL 188  --   --   --   --    < > = values in this interval not displayed.    Estimated Creatinine Clearance: 45.9 mL/min (A) (by C-G formula based on SCr of 1.6 mg/dL (H)).  Assessment: 19 yom admitted 10/9 with INR of 2.4 on warfarin PTA (7 mg Tuesday and Thursday, 5 mg all other days per med rec). Patient has a mechanical aortic valve. Warfarin held on 10/13-10/15 and received 1x 5 mg dose of vitamin k on 10/15 so pt could get TEE. Cr today improving and is now 1.6  Patient continues on IV heparin. Heparin level is now therapeutic.   Goal of Therapy:  Heparin level 0.3-0.7 units/ml Monitor platelets by anticoagulation protocol: Yes   Plan:  Continue heparin gtt 1350 units/hr Daily heparin level and CBC  Salome Arnt, PharmD, BCPS Please see AMION for all pharmacy numbers 04/05/2019 4:15 PM

## 2019-04-05 NOTE — Progress Notes (Signed)
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 Jimmy Barton.  Patient presented to the ED on 10/9 with sudden onset of fever, chills and tremors associated with right-sided chest pain at home.  In the ED, patient had a temperature 100.2, blood pressure was initially normal and later trended down as low as 86/57. Required low-flow oxygen supply to maintain O2 sat above 90%. Work-up in the ED showed n WBC count elevated to 21.4, hemoglobin 12.8, platelet 163, sodium 134, BUN/creatinine 35/1.64, INR therapeutic at 2.4, lactic acid level normal COVID-19 test negative. Urinalysis showed cloudy urine with WBC >50 and rare bacteria. Chest x-ray showed streaky airspace opacities in the left lung base possibly consolidation. CT abdomen and pelvis showed a 5.1 cm infrarenal abdominal aortic aneurysm. Also left lower quadrant renal transplant with mild perinephric stranding no hydronephrosis. 1.8 cm bladder calculus.  Patient initiated on IV antibiotics and is was admitted for further evaluation and management.  Over the course of last 3 days, patient has had unstable blood pressure down as low as 60s. Because of compromised renal perfusion, he has had AKI with significant rise in creatinine which is still ongoing. He was transferred to ICU on 10/11, briefly required pressors and transfer out on 10/12.  10/15: Per nursing, pt threatening to leave AMA 10/16: In a better mood today. Eager to get TEE done. Denies complaints. 10/17: TEE negative for vegetation. ?Cx-neg endocarditis? 10/18: No acute events ON. Denies  complaints today.    Assessment & Plan:   Principal Problem:   Endocarditis Active Problems:   H/O prosthetic aortic valve replacement   Hx of CABG   Renal transplant recipient   Sepsis secondary to UTI [facility])   Community acquired pneumonia   AKI (acute kidney injury) ([place])   Fever   Sepsis with hypotension  - Met sepsis criteria on admission with leukocytosis, fever and potential source.  - Bld Cx did not show any growth. UCx showed insignificant growth of less than 10,000 CFU per mL.  - TTE showed a possible oscillating density in the mitral valve. Scheduled for TEE but unable to do d/t elevated INR - ID consultation appreciated. Currently on IV Rocephin and IV daptomycin.  - BPs improved - presumed source is endocarditis; however, INR is elevated so unable to do TEE right now; will give vit K today and see how well he reverses; coumadin is per pharmacy, currently held - INR appropriate for TEE today; scheduled for this afternoon; continue current Tx for now     - 10/17: TEE negative for vegetation     - will need central tunneled cath by IR for long duration abx (not picc)     - 10/18: will continue abx for now (duration pending)  Acute kidney injury on a transplant kidney Metabolic acidosis - Renal transplant in 1998. Follows up with Dr. Jimmy Barton.  - Baseline creatinine 1.3-1.4.  - Patient presented with creatinine elevated to 1.64, steadily worsening, peaked at 6.09; it is3.20today. - Fluid balance +9.7 L. UOP ok. - NaHCO3 1339m BID; Bicarb is 16 - Immunosuppression changed from CellCept to mycophenolate  by nephrology. - If no further improvement in creatinine, patient may end up requiring dialysis. - nephrology onboard, appreciate assistance     - 10/17: Renal function improving; renal has s/o'd      - 10/18: renal function continues to improve, will need follow up with nephrology outpt  Diarrhea  - resolved  - Patient had few episodes of greenish fluids bowel movement in first 2 days of hospitalization which he states is because of brown-colored CellCept that he has been getting in the hospital. Once the pill was switched, diarrhea stopped. - denies diarrhea prior to admission   Type 2 diabetes mellitus - A1c 6.6 in 2018 - glucose ok  Mechanical aortic valve Supratherapeutic INR - On Coumadin, INR 2.4 on presentation. Pharmacy to dose Coumadin to maintain INR between 2.5-3. - INR 5.7 today - urine is blood tinged - will add OT dose vit K - 10/16: INR 1.4 today; heparin gtt/coumadin bridge     - 10/17: INR still 1.4; continuing heparin gtt/coumadin bridge  CAD s/p CABG 1995 Elevated troponin - Troponin was slightly elevated at 21 on admission. Currently not complaining of chest pain. Continue to monitor. - Home meds include aspirin, statin; continue  Combined systolic and diastolic CHF  History of hypertension - Echo 10/10, EF 45 to 50%, unchanged. - Home meds include Lasix 40 mg daily, lisinopril 5 mg daily. lisinopril hold because of hypotension and AKI. - currently on lasix 34m IV; monitor   Sinus bradycardia  - s/p loop recorder  BPH  - continue Flomax.  Hypokalemia Hypomagnesemia Hypophosphatemia     - replete, monitor  DVT prophylaxis:heparin gtt/coumadin Code Status:FULL Disposition Plan:TBD  Consultants:  ID  Cardiology  Nephrolo  Antimicrobials:  Dapto, rocephin  ROS:  Denies CP, ab pain, dyspnea. Remainder 10-pt ROS is negative for all not previously mentioned.  Subjective: "They promised I could go tomorrow."  Objective: Vitals:   04/04/19 2204 04/05/19 0509 04/05/19 0950 04/05/19 1253  BP: (!) 160/79 (!) 156/90 (!) 160/84 136/69  Pulse: 88 96 82 70  Resp: 16 16  18   Temp: 98.9 F (37.2 C) 98.7 F (37.1 C) 97.6 F (36.4 C) 98.2 F (36.8  C)  TempSrc: Oral Oral Oral Oral  SpO2: 98% 95% 97% 96%  Weight:  92.2 kg    Height:        Intake/Output Summary (Last 24 hours) at 04/05/2019 1411 Last data filed at 04/05/2019 1300 Gross per 24 hour  Intake 1417.41 ml  Output 2780 ml  Net -1362.59 ml   Filed Weights   03/22/2019 0313 04/04/19 0621 04/05/19 0509  Weight: 93.8 kg 92.5 kg 92.2 kg    Examination:  General:73 y.o.maleresting in chairin NAD Cardiovascular: RRR, +S1, S2, no m/g/r Respiratory: CTABL, no w/r/r, normal WOB GI: BS+, NDNT, no masses noted, no organomegaly noted MSK: No e/c/c Neuro: Alert to name, follows commands Psyc: Appropriate interaction and affect, calm/cooperative   Data Reviewed: I have personally reviewed following labs and imaging studies.  CBC: Recent Labs  Lab 04/01/19 0534 04/02/19 0716 04/06/2019 0507 04/04/19 0503 04/05/19 0601  WBC 11.7* 10.2 10.4 10.8* 11.5*  NEUTROABS 10.8* 9.5* 8.5* 8.3*  --   HGB 10.3* 10.6* 10.7* 11.0* 10.5*  HCT 33.2* 32.9* 32.3* 32.9* 32.3*  MCV 90.7 88.9 87.8 87.7 88.5  PLT 150 172 186 193 2053  Basic Metabolic Panel: Recent Labs  Lab 03/30/19 0216  04/01/19 0534 04/02/19 0716 03/23/2019 0507 04/04/19 0503 04/05/19 0601  NA 134*   < >  137  136 138 140 141 141  K 4.2   < > 4.0  4.0 3.5 3.4* 3.2* 3.3*  CL 105   < > 109  108 109 111 107 105  CO2 15*   < > 14*  14* 16* 21* 23 25  GLUCOSE 161*   < > 119*  120* 126* 130* 126* 124*  BUN 69*   < > 86*  86* 82* 61* 39* 25*  CREATININE 5.13*   < > 6.00*  6.02* 4.39* 3.20* 2.14* 1.60*  CALCIUM 7.0*   < > 6.9*  6.9* 7.0* 7.1* 7.3* 7.4*  MG 2.2  --  2.2  --  2.1 1.8 1.4*  PHOS  --    < > 3.7  3.7 3.0 2.8 2.7 2.0*   < > = values in this interval not displayed.   GFR: Estimated Creatinine Clearance: 45.9 mL/min (A) (by C-G formula based on SCr of 1.6 mg/dL (H)). Liver Function Tests: Recent Labs  Lab 04/01/19 0534 04/02/19 0716 03/28/2019 0507 04/04/19 0503 04/05/19 0601  ALBUMIN  2.8* 2.8* 2.9* 3.1* 3.1*   No results for input(s): LIPASE, AMYLASE in the last 168 hours. No results for input(s): AMMONIA in the last 168 hours. Coagulation Profile: Recent Labs  Lab 04/01/19 0534 04/02/19 0716 04/01/2019 0507 04/04/19 0503 04/05/19 0601  INR 6.6* 5.7* 1.4* 1.4* 1.4*   Cardiac Enzymes: Recent Labs  Lab 04/01/19 0534 04/17/2019 0507  CKTOTAL 289 188   BNP (last 3 results) No results for input(s): PROBNP in the last 8760 hours. HbA1C: No results for input(s): HGBA1C in the last 72 hours. CBG: Recent Labs  Lab 03/29/19 1854 04/05/19 0730  GLUCAP 133* 122*   Lipid Profile: No results for input(s): CHOL, HDL, LDLCALC, TRIG, CHOLHDL, LDLDIRECT in the last 72 hours. Thyroid Function Tests: No results for input(s): TSH, T4TOTAL, FREET4, T3FREE, THYROIDAB in the last 72 hours. Anemia Panel: No results for input(s): VITAMINB12, FOLATE, FERRITIN, TIBC, IRON, RETICCTPCT in the last 72 hours. Sepsis Labs: Recent Labs  Lab 03/29/19 1950 03/30/19 0932  PROCALCITON  --  28.50  LATICACIDVEN 0.9  --     Recent Results (from the past 240 hour(s))  SARS CORONAVIRUS 2 (TAT 6-24 HRS) Nasopharyngeal Nasopharyngeal Swab     Status: None   Collection Time: 04/15/2019  6:23 PM   Specimen: Nasopharyngeal Swab  Result Value Ref Range Status   SARS Coronavirus 2 NEGATIVE NEGATIVE Final    Comment: (NOTE) SARS-CoV-2 target nucleic acids are NOT DETECTED. The SARS-CoV-2 RNA is generally detectable in upper and lower respiratory specimens during the acute phase of infection. Negative results do not preclude SARS-CoV-2 infection, do not rule out co-infections with other pathogens, and should not be used as the sole basis for treatment or other patient management decisions. Negative results must be combined with clinical observations, patient history, and epidemiological information. The expected result is Negative. Fact Sheet for Patients:  SugarRoll.be Fact Sheet for Healthcare Providers: https://www.woods-mathews.com/ This test is not yet approved or cleared by the [place] FDA and  has been authorized for detection and/or diagnosis of SARS-CoV-2 by FDA under an Emergency Use Authorization (EUA). This EUA will remain  in effect (meaning this test can be used) for the duration of the COVID-19 declaration under Section 56 4(b)(1) of the Act, 21 U.S.C. section 360bbb-3(b)(1), unless the authorization is terminated or revoked sooner. Performed at [facility], [place] [address] [place], [place] [address]   Culture, blood (routine x  2)     Status: None   Collection Time: 03/23/2019  6:47 PM   Specimen: BLOOD RIGHT ARM  Result Value Ref Range Status   Specimen Description BLOOD RIGHT ARM  Final   Special Requests   Final    BOTTLES DRAWN AEROBIC AND ANAEROBIC Blood Culture adequate volume   Culture   Final    NO GROWTH 6 DAYS Performed at [facility], [address] [place], [place] [address]    Report Status 04/02/2019 FINAL  Final  Culture, blood (routine x 2)     Status: None   Collection Time: 03/23/2019  7:00 PM   Specimen: BLOOD RIGHT WRIST  Result Value Ref Range Status   Specimen Description BLOOD RIGHT WRIST  Final   Special Requests   Final    BOTTLES DRAWN AEROBIC AND ANAEROBIC Blood Culture results may not be optimal due to an excessive volume of blood received in culture bottles   Culture   Final    NO GROWTH 6 DAYS Performed at [facility], [place] [address], [place], [place] [address]    Report Status 04/02/2019 FINAL  Final  Urine culture     Status: Abnormal   Collection Time: 03/26/2019 11:15 PM   Specimen: Urine, Random  Result Value Ref Range Status   Specimen Description URINE, RANDOM  Final   Special Requests NONE  Final   Culture (A)  Final    <10,000 COLONIES/mL INSIGNIFICANT GROWTH Performed at [facility], [place] [address], [place], [place] [address]    Report Status 03/29/2019 FINAL  Final  MRSA PCR Screening     Status: None   Collection Time: 03/28/19  8:34 AM   Specimen: Nasal Mucosa; Nasopharyngeal  Result Value Ref Range Status   MRSA by PCR NEGATIVE NEGATIVE Final    Comment:        The GeneXpert MRSA Assay (FDA approved for NASAL specimens only), is one component of a comprehensive MRSA colonization surveillance program. It is not intended to diagnose MRSA infection nor to guide or monitor treatment for MRSA infections. Performed at [facility], [place] [address], [place], [place] [address]   C difficile quick scan w PCR reflex     Status: None   Collection Time: 03/29/19  2:47 AM   Specimen: STOOL  Result Value Ref Range Status   C Diff antigen NEGATIVE NEGATIVE Final   C Diff toxin NEGATIVE NEGATIVE Final   C Diff interpretation No C. difficile detected.  Final    Comment: Performed at [facility], [place] [address], [place], [place] [address]  GI pathogen panel by PCR, stool     Status: None   Collection Time: 03/29/19  8:17 AM   Specimen: Stool  Result Value Ref Range Status   Plesiomonas shigelloides NOT PERFORMED  Final    Comment: Test not performed   Yersinia enterocolitica NOT PERFORMED  Final    Comment: Test not performed   Vibrio NOT PERFORMED  Final    Comment: Test not performed   Enteropathogenic E coli NOT PERFORMED  Final    Comment: Test not performed   E coli (ETEC) LT/ST NOT PERFORMED  Final    Comment: Test not performed   E coli 0157 by PCR NOT PERFORMED  Final    Comment: Test not performed   Cryptosporidium by PCR NOT PERFORMED  Final    Comment: Test not performed   Entamoeba histolytica NOT  PERFORMED  Final    Comment: Test not performed   Adenovirus F 40/41 NOT PERFORMED  Final    Comment: Test not performed   Norovirus GI/GII NOT PERFORMED  Final    Comment: Test not performed   Sapovirus NOT PERFORMED  Final    Comment: (NOTE) Test not  performed Performed At: [facility] [place], [place] [id number] Rush Farmer MD PI:[id number]    Vibrio cholerae NOT PERFORMED  Final    Comment: Test not performed   Campylobacter by PCR YTK1  Final    Comment: (NOTE) The specimen submitted does not meet the laboratory's criteria for acceptability. Refer to [employer] of Services for specimen acceptability criteria.      Required: ParaPak Orange      Received: Sterile Container      Mayra Neer was notified 03/30/2019.    Salmonella by PCR NOT PERFORMED  Final    Comment: Test not performed   E coli (STEC) NOT PERFORMED  Final    Comment: Test not performed   Enteroaggregative E coli NOT PERFORMED  Final    Comment: Test not performed   Shigella by PCR NOT PERFORMED  Final    Comment: Test not performed   Cyclospora cayetanensis NOT PERFORMED  Final    Comment: Test not performed   Astrovirus NOT PERFORMED  Final    Comment: Test not performed   G lamblia by PCR NOT PERFORMED  Final    Comment: Test not performed   Rotavirus A by PCR NOT PERFORMED  Final    Comment: Test not performed CALLED TO L.KING,RN AT 1732 03/30/19 BY L.PITT  Performed at [facility], [place] [address] [place], [place] [address]       Radiology Studies: No results found.   Scheduled Meds: . aspirin EC  81 mg Oral Daily  . atorvastatin  80 mg Oral Daily  . calcitRIOL  0.5 mcg Oral Once per day on Tue Thu Sat   And  . calcitRIOL  1 mcg Oral Once per day on Sun Mon Wed Fri  . Chlorhexidine Gluconate Cloth  6 each Topical Daily  . furosemide  40 mg Intravenous Daily  . influenza vaccine adjuvanted  0.5 mL Intramuscular Tomorrow-1000  . mouth rinse  15 mL Mouth Rinse BID  . mycophenolate  1,000 mg Oral BID  . pneumococcal 23 valent vaccine  0.5 mL Intramuscular Tomorrow-1000  . potassium chloride  40 mEq Oral Daily  . sodium bicarbonate  1,300 mg Oral BID  . tamsulosin  0.4 mg Oral Daily  . warfarin  7.5  mg Oral ONCE-1800  . Warfarin - Pharmacist Dosing Inpatient   Does not apply q1800   Continuous Infusions: . sodium chloride 250 mL (04/04/19 1147)  . cefTRIAXone (ROCEPHIN)  IV 2 g (04/05/19 1001)  . DAPTOmycin (CUBICIN)  IV    . heparin 1,350 Units/hr (04/05/19 1011)  . sodium phosphate  Dextrose 5% IVPB 30 mmol (04/05/19 1141)     LOS: 9 days    Time spent: 25 minutes spent in the coordination of care today.    Jonnie Finner, DO [facility] Pager [phone]  If 7PM-7AM, please contact night-coverage [email or web address] Password [email or web address] 04/05/2019, 2:11 PM

## 2019-04-06 ENCOUNTER — Inpatient Hospital Stay (HOSPITAL_COMMUNITY): Payer: Medicare Other

## 2019-04-06 ENCOUNTER — Encounter (HOSPITAL_COMMUNITY): Payer: Self-pay | Admitting: General Surgery

## 2019-04-06 DIAGNOSIS — K429 Umbilical hernia without obstruction or gangrene: Secondary | ICD-10-CM

## 2019-04-06 DIAGNOSIS — R109 Unspecified abdominal pain: Secondary | ICD-10-CM

## 2019-04-06 DIAGNOSIS — R58 Hemorrhage, not elsewhere classified: Secondary | ICD-10-CM

## 2019-04-06 LAB — CBC
HCT: 27.4 % — ABNORMAL LOW (ref 39.0–52.0)
Hemoglobin: 8.5 g/dL — ABNORMAL LOW (ref 13.0–17.0)
MCH: 28.1 pg (ref 26.0–34.0)
MCHC: 31 g/dL (ref 30.0–36.0)
MCV: 90.4 fL (ref 80.0–100.0)
Platelets: 239 10*3/uL (ref 150–400)
RBC: 3.03 MIL/uL — ABNORMAL LOW (ref 4.22–5.81)
RDW: 14.6 % (ref 11.5–15.5)
WBC: 13.5 10*3/uL — ABNORMAL HIGH (ref 4.0–10.5)
nRBC: 0 % (ref 0.0–0.2)

## 2019-04-06 LAB — RENAL FUNCTION PANEL
Albumin: 2.9 g/dL — ABNORMAL LOW (ref 3.5–5.0)
Anion gap: 10 (ref 5–15)
BUN: 24 mg/dL — ABNORMAL HIGH (ref 8–23)
CO2: 25 mmol/L (ref 22–32)
Calcium: 6.9 mg/dL — ABNORMAL LOW (ref 8.9–10.3)
Chloride: 106 mmol/L (ref 98–111)
Creatinine, Ser: 1.82 mg/dL — ABNORMAL HIGH (ref 0.61–1.24)
GFR calc Af Amer: 41 mL/min — ABNORMAL LOW (ref >60)
GFR calc non Af Amer: 36 mL/min — ABNORMAL LOW (ref >60)
Glucose, Bld: 173 mg/dL — ABNORMAL HIGH (ref 70–99)
Phosphorus: 3.8 mg/dL (ref 2.5–4.6)
Potassium: 3.5 mmol/L (ref 3.5–5.1)
Sodium: 141 mmol/L (ref 135–145)

## 2019-04-06 LAB — CBC WITH DIFFERENTIAL/PLATELET
Abs Immature Granulocytes: 0 10*3/uL (ref 0.00–0.07)
Basophils Absolute: 0 10*3/uL (ref 0.0–0.1)
Basophils Relative: 0 %
Eosinophils Absolute: 0 10*3/uL (ref 0.0–0.5)
Eosinophils Relative: 0 %
HCT: 32.4 % — ABNORMAL LOW (ref 39.0–52.0)
Hemoglobin: 11 g/dL — ABNORMAL LOW (ref 13.0–17.0)
Lymphocytes Relative: 8 %
Lymphs Abs: 1.3 10*3/uL (ref 0.7–4.0)
MCH: 29.6 pg (ref 26.0–34.0)
MCHC: 34 g/dL (ref 30.0–36.0)
MCV: 87.3 fL (ref 80.0–100.0)
Monocytes Absolute: 0.7 10*3/uL (ref 0.1–1.0)
Monocytes Relative: 4 %
Neutro Abs: 14.6 10*3/uL — ABNORMAL HIGH (ref 1.7–7.7)
Neutrophils Relative %: 88 %
Platelets: 127 10*3/uL — ABNORMAL LOW (ref 150–400)
RBC: 3.71 MIL/uL — ABNORMAL LOW (ref 4.22–5.81)
RDW: 14.6 % (ref 11.5–15.5)
WBC: 16.6 10*3/uL — ABNORMAL HIGH (ref 4.0–10.5)
nRBC: 0 % (ref 0.0–0.2)
nRBC: 0 /100 WBC

## 2019-04-06 LAB — HEPATIC FUNCTION PANEL
ALT: 22 U/L (ref 0–44)
AST: 25 U/L (ref 15–41)
Albumin: 2.6 g/dL — ABNORMAL LOW (ref 3.5–5.0)
Alkaline Phosphatase: 54 U/L (ref 38–126)
Bilirubin, Direct: <0.1 mg/dL (ref 0.0–0.2)
Total Bilirubin: 0.4 mg/dL (ref 0.3–1.2)
Total Protein: 4.8 g/dL — ABNORMAL LOW (ref 6.5–8.1)

## 2019-04-06 LAB — PREPARE RBC (CROSSMATCH)

## 2019-04-06 LAB — HEMOGLOBIN AND HEMATOCRIT, BLOOD
HCT: 21.5 % — ABNORMAL LOW (ref 39.0–52.0)
HCT: 25.4 % — ABNORMAL LOW (ref 39.0–52.0)
Hemoglobin: 6.6 g/dL — CL (ref 13.0–17.0)
Hemoglobin: 7.9 g/dL — ABNORMAL LOW (ref 13.0–17.0)

## 2019-04-06 LAB — POCT I-STAT 4, (NA,K, GLUC, HGB,HCT)
Glucose, Bld: 194 mg/dL — ABNORMAL HIGH (ref 70–99)
HCT: 22 % — ABNORMAL LOW (ref 39.0–52.0)
Hemoglobin: 7.5 g/dL — ABNORMAL LOW (ref 13.0–17.0)
Potassium: 4.5 mmol/L (ref 3.5–5.1)
Sodium: 141 mmol/L (ref 135–145)

## 2019-04-06 LAB — BPAM CRYOPRECIPITATE
Blood Product Expiration Date: 202010191915
ISSUE DATE / TIME: 202010191403
Unit Type and Rh: 6200

## 2019-04-06 LAB — MASSIVE TRANSFUSION PROTOCOL ORDER (BLOOD BANK NOTIFICATION)

## 2019-04-06 LAB — BPAM PLATELET PHERESIS
Blood Product Expiration Date: 202010202359
ISSUE DATE / TIME: 202010191314
Unit Type and Rh: 7300

## 2019-04-06 LAB — HEPARIN LEVEL (UNFRACTIONATED): Heparin Unfractionated: 0.46 IU/mL (ref 0.30–0.70)

## 2019-04-06 LAB — PREPARE CRYOPRECIPITATE: Unit division: 0

## 2019-04-06 LAB — BASIC METABOLIC PANEL
Anion gap: 11 (ref 5–15)
BUN: 26 mg/dL — ABNORMAL HIGH (ref 8–23)
CO2: 21 mmol/L — ABNORMAL LOW (ref 22–32)
Calcium: 6.2 mg/dL — CL (ref 8.9–10.3)
Chloride: 106 mmol/L (ref 98–111)
Creatinine, Ser: 1.82 mg/dL — ABNORMAL HIGH (ref 0.61–1.24)
GFR calc Af Amer: 41 mL/min — ABNORMAL LOW (ref >60)
GFR calc non Af Amer: 36 mL/min — ABNORMAL LOW (ref >60)
Glucose, Bld: 210 mg/dL — ABNORMAL HIGH (ref 70–99)
Potassium: 4.9 mmol/L (ref 3.5–5.1)
Sodium: 138 mmol/L (ref 135–145)

## 2019-04-06 LAB — PROTIME-INR
INR: 1.8 — ABNORMAL HIGH (ref 0.8–1.2)
INR: 1.1 (ref 0.8–1.2)
Prothrombin Time: 20.2 seconds — ABNORMAL HIGH (ref 11.4–15.2)
Prothrombin Time: 14.5 seconds (ref 11.4–15.2)

## 2019-04-06 LAB — LIPASE, BLOOD: Lipase: 60 U/L — ABNORMAL HIGH (ref 11–51)

## 2019-04-06 LAB — PREPARE PLATELET PHERESIS: Unit division: 0

## 2019-04-06 LAB — MAGNESIUM: Magnesium: 1.4 mg/dL — ABNORMAL LOW (ref 1.7–2.4)

## 2019-04-06 LAB — ABO/RH: ABO/RH(D): A NEG

## 2019-04-06 LAB — GLUCOSE, CAPILLARY: Glucose-Capillary: 228 mg/dL — ABNORMAL HIGH (ref 70–99)

## 2019-04-06 MED ORDER — PHENYLEPHRINE HCL-NACL 10-0.9 MG/250ML-% IV SOLN
0.0000 ug/min | INTRAVENOUS | Status: DC
  Administered 2019-04-06: 13:00:00 20 ug/min via INTRAVENOUS

## 2019-04-06 MED ORDER — CALCIUM CARBONATE ANTACID 500 MG PO CHEW: 1.0000 | CHEWABLE_TABLET | Freq: Two times a day (BID) | ORAL | Status: DC

## 2019-04-06 MED ORDER — PROTAMINE SULFATE 10 MG/ML IV SOLN
10.0000 mg | Freq: Once | INTRAVENOUS | Status: AC
  Administered 2019-04-06: 10 mg via INTRAVENOUS

## 2019-04-06 MED ORDER — LIDOCAINE HCL 1 % IJ SOLN
5.0000 mL | Freq: Once | INTRAMUSCULAR | Status: AC
  Administered 2019-04-06: 17:00:00 5 mL via INTRADERMAL
  Filled 2019-04-06: qty 5

## 2019-04-06 MED ORDER — SODIUM CHLORIDE 0.9 % IV BOLUS
1000.0000 mL | Freq: Once | INTRAVENOUS | Status: AC
  Administered 2019-04-06: 1000 mL via INTRAVENOUS

## 2019-04-06 MED ORDER — PHENYLEPHRINE HCL-NACL 10-0.9 MG/250ML-% IV SOLN: 0.0000 ug/min | INTRAVENOUS | Status: DC

## 2019-04-06 MED ORDER — WARFARIN SODIUM 7.5 MG PO TABS: 7.5000 mg | ORAL_TABLET | Freq: Once | ORAL | Status: DC

## 2019-04-06 MED ORDER — SODIUM CHLORIDE 0.9% IV SOLUTION
Freq: Once | INTRAVENOUS | Status: AC
  Administered 2019-04-06: 17:00:00 via INTRAVENOUS

## 2019-04-06 MED ORDER — PANTOPRAZOLE SODIUM 40 MG IV SOLR
40.0000 mg | INTRAVENOUS | Status: DC
  Administered 2019-04-06 – 2019-04-08 (×3): 40 mg via INTRAVENOUS
  Filled 2019-04-06 (×4): qty 40

## 2019-04-06 MED ORDER — CALCIUM GLUCONATE-NACL 1-0.675 GM/50ML-% IV SOLN
1.0000 g | Freq: Once | INTRAVENOUS | Status: AC
  Administered 2019-04-06: 1000 mg via INTRAVENOUS
  Filled 2019-04-06: qty 50

## 2019-04-06 MED ORDER — LIDOCAINE HCL (PF) 1 % IJ SOLN
INTRAMUSCULAR | Status: AC
  Administered 2019-04-06: 5 mL via INTRADERMAL
  Filled 2019-04-06: qty 5

## 2019-04-06 MED ORDER — SODIUM CHLORIDE 0.9 % IV BOLUS
Freq: Once | INTRAVENOUS | Status: AC
  Administered 2019-04-06: 11:00:00 via INTRAVENOUS

## 2019-04-06 MED ORDER — SODIUM CHLORIDE 0.9% IV SOLUTION
Freq: Once | INTRAVENOUS | Status: AC
  Administered 2019-04-06: 15:00:00 via INTRAVENOUS

## 2019-04-06 MED ORDER — PROTHROMBIN COMPLEX CONC HUMAN 500 UNITS IV KIT
1652.0000 [IU] | PACK | Status: AC
  Administered 2019-04-06: 1652 [IU] via INTRAVENOUS
  Filled 2019-04-06: qty 1152

## 2019-04-06 MED ORDER — PHENYLEPHRINE HCL-NACL 10-0.9 MG/250ML-% IV SOLN
INTRAVENOUS | Status: AC
  Administered 2019-04-06: 20 ug/min via INTRAVENOUS
  Filled 2019-04-06: qty 250

## 2019-04-06 MED ORDER — ONDANSETRON HCL 4 MG/2ML IJ SOLN: 4.0000 mg | Freq: Four times a day (QID) | INTRAMUSCULAR | Status: DC

## 2019-04-06 MED ORDER — VITAMIN K1 10 MG/ML IJ SOLN
10.0000 mg | INTRAVENOUS | Status: AC
  Administered 2019-04-06: 14:00:00 10 mg via INTRAVENOUS
  Filled 2019-04-06: qty 1

## 2019-04-06 MED ORDER — MAGNESIUM SULFATE 2 GM/50ML IV SOLN
2.0000 g | Freq: Once | INTRAVENOUS | Status: AC
  Administered 2019-04-06: 2 g via INTRAVENOUS
  Filled 2019-04-06: qty 50

## 2019-04-06 NOTE — Procedures (Signed)
Arterial Catheter Insertion Procedure Note AZON SUGDEN [id number] November 19, 1945  Procedure: Insertion of Arterial Catheter  Indications: Blood pressure monitoring  Procedure Details Consent: Risks of procedure as well as the alternatives and risks of each were explained to the (patient/caregiver).  Consent for procedure obtained. Time Out: Verified patient identification, verified procedure, site/side was marked, verified correct patient position, special equipment/implants available, medications/allergies/relevent history reviewed, required imaging and test results available.  Performed  Maximum sterile technique was used including antiseptics, cap, gloves, gown, hand hygiene, mask and sheet. Skin prep: Chlorhexidine; local anesthetic administered 20 gauge catheter was inserted into right radial artery using the Seldinger technique. ULTRASOUND GUIDANCE USED: NO Evaluation Blood flow good; BP tracing good. Complications: No apparent complications.  Initially wire threaded easily, catheter threaded over with slight pressure, but no blood back, slowly drew catheter back until pulsatile blood back, reinserted guidewire then catheter rethreaded over wire easily, with good waveform.     [name] 04/06/2019

## 2019-04-06 NOTE — Progress Notes (Signed)
Attempted to update Daughter per pt request.

## 2019-04-06 NOTE — Progress Notes (Signed)
ANTICOAGULATION CONSULT NOTE - Follow Up Consult  Pharmacy Consult for Heparin/Warfarin Indication: Afib, mechanical AVR  Patient Measurements: Height: 5' 10.5" (179.1 cm) Weight: 204 lb 12.8 oz (92.9 kg) IBW/kg (Calculated) : 74.15 Heparin dosing weight 86 kg  Vital Signs: Temp: 97.3 F (36.3 C) (10/19 0400) Temp Source: Oral (10/19 0400) BP: 109/55 (10/19 0400) Pulse Rate: 48 (10/19 0400)  Labs: Recent Labs    04/04/19 0503 04/05/19 0601 04/05/19 1528 04/06/19 0428  HGB 11.0* 10.5*  --  8.5*  HCT 32.9* 32.3*  --  27.4*  PLT 193 240  --  239  LABPROT 16.7* 17.4*  --  20.2*  INR 1.4* 1.4*  --  1.8*  HEPARINUNFRC 0.55 0.82* 0.53 0.46  CREATININE 2.14* 1.60*  --  1.82*    Estimated Creatinine Clearance: 40.5 mL/min (A) (by C-G formula based on SCr of 1.82 mg/dL (H)).  Assessment: 36 yom admitted 10/9 with INR of 2.4 on warfarin PTA (7 mg Tuesday and Thursday, 5 mg all other days per med rec). Patient has a mechanical aortic valve. Warfarin held on 10/13-10/15 and received 1x 5 mg dose of vitamin k on 10/15 so pt could get TEE.   Patient continues on IV heparin. Heparin level is therapeutic.  Warfarin resumed with INR of 1.8  Goal of Therapy:  Heparin level 0.3-0.7 units/ml Monitor platelets by anticoagulation protocol: Yes  INR 2.5 to 3   Plan:  Continue heparin gtt 1350 units/hr Repeat Warfarin 7.5 mg po x 1 tonight Daily heparin level, CBC, INR  Thank you Anette Guarneri, PharmD Please see AMION for all pharmacy numbers 04/06/2019 7:56 AM

## 2019-04-06 NOTE — TOC Progression Note (Signed)
Transition of Care [facility]) - Progression Note    Patient Details  Name: John Barton MRN: [id number] Date of Birth: 25-Jan-1946  Transition of [facility]) CM/SW Contact  Zenon Mayo, RN Phone Number: 04/06/2019, 3:27 PM  Clinical Narrative:    Patient with low BP's , transferred to ICU today, he is a HD patient also.  NCM received call from Carolynn Sayers iv infusion RN , she is following along with ID with this patient. He will need HHRN for iv abx at dc, will need to offer choice when close to dc.        Expected Discharge Plan and Services                                                 Social Determinants of Health (SDOH) Interventions    Readmission Risk Interventions No flowsheet data found.

## 2019-04-06 NOTE — Significant Event (Signed)
Rapid Response Event Note  Overview: Time Called: 1153 Arrival Time: 1155 Event Type: Hypotension  Initial Focused Assessment: BP 76/57  HR 81  RR 20 O2 sat 100% on RA Pain left flank.  Syncope when he stood up to go to the bathroom.  Lying flat he is pale and weak.  Interventions: Heparin gtt stopped NS bolus given Type and screen drawn CT abdomen and pelvis Transported to [place] (if not transferred):  Event Summary: Name of Physician Notified: Cherylann Ratel at bedside at    Name of Consulting Physician Notified: CCM at 1200  Outcome: Transferred (Comment)  Event End Time: [place]  Raliegh Ip

## 2019-04-06 NOTE — Progress Notes (Signed)
Pt. stood up got very lightheaded and weak with NT at bedside. Returned back to bed. MD made aware.

## 2019-04-06 NOTE — Care Management Important Message (Signed)
Important Message  Patient Details  Name: John Barton MRN: [id number] Date of Birth: 1946/01/27   Medicare Important Message Given:  Yes     [name] 04/06/2019, 1:17 PM

## 2019-04-06 NOTE — Progress Notes (Signed)
Transfused 4 units PRBC via rapid transfuser as well as 2 FFP this shift. Transfusing 2 other units of PRBC at rate of 600 per MD. Each unit ordered to be transfused in 30 mins.

## 2019-04-06 NOTE — Progress Notes (Signed)
Attempted aline x2 per MD. Restricted on left side, so attempted on right. Easy stick x2, but would not thread. MD aware, and is going to try herself or go femoral

## 2019-04-06 NOTE — Progress Notes (Signed)
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 Jimmy Footman.  Patient presented to the ED on 10/9 with sudden onset of fever, chills and tremors associated with right-sided chest pain at home.  In the ED, patient had a temperature 100.2, blood pressure was initially normal and later trended down as low as 86/57. Required low-flow oxygen supply to maintain O2 sat above 90%. Work-up in the ED showed n WBC count elevated to 21.4, hemoglobin 12.8, platelet 163, sodium 134, BUN/creatinine 35/1.64, INR therapeutic at 2.4, lactic acid level normal COVID-19 test negative. Urinalysis showed cloudy urine with WBC >50 and rare bacteria. Chest x-ray showed streaky airspace opacities in the left lung base possibly consolidation. CT abdomen and pelvis showed a 5.1 cm infrarenal abdominal aortic aneurysm. Also left lower quadrant renal transplant with mild perinephric stranding no hydronephrosis. 1.8 cm bladder calculus.  Patient initiated on IV antibiotics and is was admitted for further evaluation and management.  Over the course of last 3 days, patient has had unstable blood pressure down as low as 60s. Because of compromised renal perfusion, he has had AKI with significant rise in creatinine which is still ongoing. He was transferred to ICU on 10/11, briefly required pressors and transfer out on 10/12.  10/15: Per nursing, pt threatening to leave AMA 10/16: In a better mood today. Eager to get TEE done. Denies complaints. 10/17: TEE negative for vegetation. ?Cx-neg endocarditis? 10/18: No acute events ON. Denies  complaints today.  10/19: C/o of weakness, ab pain today   Assessment & Plan:   Principal Problem:   Endocarditis Active Problems:   H/O prosthetic aortic valve replacement   Hx of CABG   Renal transplant recipient   Sepsis secondary to UTI [facility])   Community acquired pneumonia   AKI (acute kidney injury) ([place])   Fever   Sepsis with hypotension  - Met sepsis criteria on admission with leukocytosis, fever and potential source.  - Bld Cx did not show any growth. UCx showed insignificant growth of less than 10,000 CFU per mL.  - TTE showed a possible oscillating density in the mitral valve. Scheduled for TEE but unable to do d/t elevated INR - ID consultation appreciated. Currently on IV Rocephin and IV daptomycin.  - BPs improved - presumed source is endocarditis; however, INR is elevated so unable to do TEE right now; will give vit K today and see how well he reverses; coumadin is per pharmacy, currently held - INR appropriate for TEE today; scheduled for this afternoon; continue current Tx for now - 10/17: TEE negative for vegetation - will need central tunneled cath by IR for long duration abx (not picc)     - 10/18: will continue abx for now (duration pending)     - 10/19: C/o weakness, abdominal pain  Near syncope Symptomatic anemia      - 10/19: c/o weakness, abdominal pain this morning      - near syncope noted as patient was trying to get up from bed; became diaphoretic      - noted to be pale at bedside and hypotensive; periods  of bradycardia      - of noted 2 pt drop in H&H today      - repeat H&H was 6.6      - PCCM notified for ICU transfer      - 1L NS bolused      - 2u pRBCs ordered      - CT ab/pelvis ordered      - transfer patient to ICU  Acute kidney injury on a transplant kidney Metabolic acidosis - Renal transplant in 1998. Follows up with Dr. Jimmy Footman.  - Baseline creatinine 1.3-1.4.  - Patient  presented with creatinine elevated to 1.64, steadily worsening, peaked at 6.09; it is3.20today. - Fluid balance +9.7 L. UOP ok. - NaHCO3 1352m BID; Bicarb is 16 - Immunosuppression changed from CellCept to mycophenolate by nephrology. - nephrology onboard, appreciate assistance - 10/17: Renal function improving; renal has s/o'd  Diarrhea - resolved  - Patient had few episodes of greenish fluids bowel movement in first 2 days of hospitalization which he states is because of brown-colored CellCept that he has been getting in the hospital. Once the pill was switched, diarrhea stopped. - denies diarrhea prior to admission   Type 2 diabetes mellitus - A1c 6.6 in 2018 - glucose ok  Mechanical aortic valve Supratherapeutic INR - On Coumadin, INR 2.4 on presentation. Pharmacy to dose Coumadin to maintain INR between 2.5-3. - INR 5.7 today - urine is blood tinged - will add OT dose vit K - 10/19: INR is 1.8 today; 2 pt drop in Hgb; hold heparin   CAD s/p CABG 1995 Elevated troponin - Troponin was slightly elevated at 21 on admission. Currently not complaining of chest pain. Continue to monitor. - Home meds include aspirin, statin; continue  Combined systolic and diastolic CHF  History of hypertension - Echo 10/10, EF 45 to 50%, unchanged. - Home meds include Lasix 40 mg daily, lisinopril 5 mg daily. lisinopril hold because of hypotension and AKI. - currently on lasix 461mIV; monitor     - 10/19: hypotensive today; hold lasix   Sinus bradycardia  - s/p loop recorder  BPH  - continue Flomax.  Hypokalemia Hypomagnesemia Hypophosphatemia Hypocalcemia - Mg2+, Ca2+ by IV today; monitor     - continue K+ PO     - phos is ok  DVT prophylaxis: SCD/coumadin Code Status: FULL Family Communication: Multiple attempts made to dtr; VM each time   Disposition Plan: TBD   Consultants:   PCCM  Cardiology  ID  Neprhology  Procedures:   TEE  Antimicrobials:  . Rocephin, Daptomycin   ROS:  C/o ab pain, weakness, lightheadedness. Denies CP, dyspnea. Remainder 10-pt ROS is negative for all not previously mentioned.  Subjective: "It feels like I was kicked there."  Objective: Vitals:   04/05/19 2105 04/06/19 0151 04/06/19 0353 04/06/19 0400  BP: (!) 145/78 106/76  (!) 109/55  Pulse: 80 87  (!) 48  Resp: _0 Temp: 98.4 F (36.9 C) 98.6 F (37 C)  (!) 97.3 F (36.3 C)  TempSrc: Oral Oral  Oral  SpO2: 96% 95%  96%  Weight:   92.9 kg   Height:        Intake/Output Summary (Last 24 hours) at 04/06/2019 0719 Last data filed at 04/05/2019 2200 Gross per 24 hour  Intake 1537.42 ml  Output 1760 ml  Net -222.58 ml   Filed Weights   04/04/19 0621 04/05/19 0509 04/06/19 0353  Weight: 92.5 kg 92.2 kg 92.9  kg    Examination:  General: 73 y.o. male resting in bed pale, diaphoretic Cardiovascular: RRR, +S1, S2, no g/r, 1/6 SEM Respiratory: CTABL, no w/r/r, normal WOB GI: BS+, NDNT, no masses noted, no organomegaly noted MSK: No e/c/c Skin: No rashes, bruises, ulcerations noted Neuro: alert to name, follows commands   Data Reviewed: I have personally reviewed following labs and imaging studies.  CBC: Recent Labs  Lab 04/01/19 0534 04/02/19 0716 04/08/2019 0507 04/04/19 0503 04/05/19 0601 04/06/19 0428  WBC 11.7* 10.2 10.4 10.8* 11.5* 13.5*  NEUTROABS 10.8* 9.5* 8.5* 8.3*  --   --   HGB 10.3* 10.6* 10.7* 11.0* 10.5* 8.5*  HCT 33.2* 32.9* 32.3* 32.9* 32.3* 27.4*  MCV 90.7 88.9 87.8 87.7 88.5 90.4  PLT 150 172 186 193 240 371   Basic Metabolic Panel: Recent Labs  Lab 04/01/19 0534 04/02/19 0716 04/01/2019 0507 04/04/19 0503 04/05/19 0601 04/06/19 0428  NA 137  136 138 140 141 141 141  K 4.0  4.0 3.5 3.4* 3.2* 3.3* 3.5  CL 109  108 109 111 107 105 106  CO2 14*  14* 16* 21* _0 GLUCOSE 119*  120* 126* 130*  126* 124* 173*  BUN 86*  86* 82* 61* 39* 25* 24*  CREATININE 6.00*  6.02* 4.39* 3.20* 2.14* 1.60* 1.82*  CALCIUM 6.9*  6.9* 7.0* 7.1* 7.3* 7.4* 6.9*  MG 2.2  --  2.1 1.8 1.4* 1.4*  PHOS 3.7  3.7 3.0 2.8 2.7 2.0* 3.8   GFR: Estimated Creatinine Clearance: 40.5 mL/min (A) (by C-G formula based on SCr of 1.82 mg/dL (H)). Liver Function Tests: Recent Labs  Lab 04/02/19 0716 04/11/2019 0507 04/04/19 0503 04/05/19 0601 04/06/19 0428  ALBUMIN 2.8* 2.9* 3.1* 3.1* 2.9*   No results for input(s): LIPASE, AMYLASE in the last 168 hours. No results for input(s): AMMONIA in the last 168 hours. Coagulation Profile: Recent Labs  Lab 04/02/19 0716 04/02/2019 0507 04/04/19 0503 04/05/19 0601 04/06/19 0428  INR 5.7* 1.4* 1.4* 1.4* 1.8*   Cardiac Enzymes: Recent Labs  Lab 04/01/19 0534 04/04/2019 0507  CKTOTAL 289 188   BNP (last 3 results) No results for input(s): PROBNP in the last 8760 hours. HbA1C: No results for input(s): HGBA1C in the last 72 hours. CBG: Recent Labs  Lab 04/05/19 0730  GLUCAP 122*   Lipid Profile: No results for input(s): CHOL, HDL, LDLCALC, TRIG, CHOLHDL, LDLDIRECT in the last 72 hours. Thyroid Function Tests: No results for input(s): TSH, T4TOTAL, FREET4, T3FREE, THYROIDAB in the last 72 hours. Anemia Panel: No results for input(s): VITAMINB12, FOLATE, FERRITIN, TIBC, IRON, RETICCTPCT in the last 72 hours. Sepsis Labs: Recent Labs  Lab 03/30/19 0932  PROCALCITON 28.50    Recent Results (from the past 240 hour(s))  SARS CORONAVIRUS 2 (TAT 6-24 HRS) Nasopharyngeal Nasopharyngeal Swab     Status: None   Collection Time: 03/29/2019  6:23 PM   Specimen: Nasopharyngeal Swab  Result Value Ref Range Status   SARS Coronavirus 2 NEGATIVE NEGATIVE Final    Comment: (NOTE) SARS-CoV-2 target nucleic acids are NOT DETECTED. The SARS-CoV-2 RNA is generally detectable in upper and lower respiratory specimens during the acute phase of infection. Negative results  do not preclude SARS-CoV-2 infection, do not rule out co-infections with other pathogens, and should not be used as the sole basis for treatment or other patient management decisions. Negative results must be combined with clinical observations, patient history, and epidemiological information. The expected result is Negative. Fact Sheet for  Patients: SugarRoll.be Fact Sheet for Healthcare Providers: https://www.woods-mathews.com/ This test is not yet approved or cleared by the [place] FDA and  has been authorized for detection and/or diagnosis of SARS-CoV-2 by FDA under an Emergency Use Authorization (EUA). This EUA will remain  in effect (meaning this test can be used) for the duration of the COVID-19 declaration under Section 56 4(b)(1) of the Act, 21 U.S.C. section 360bbb-3(b)(1), unless the authorization is terminated or revoked sooner. Performed at [facility], [place] [address] [place], [place] [address]   Culture, blood (routine x 2)     Status: None   Collection Time: 04/01/2019  6:47 PM   Specimen: BLOOD RIGHT ARM  Result Value Ref Range Status   Specimen Description BLOOD RIGHT ARM  Final   Special Requests   Final    BOTTLES DRAWN AEROBIC AND ANAEROBIC Blood Culture adequate volume   Culture   Final    NO GROWTH 6 DAYS Performed at [facility], [place] [address] [place], [place] [address]    Report Status 04/02/2019 FINAL  Final  Culture, blood (routine x 2)     Status: None   Collection Time: 04/01/2019  7:00 PM   Specimen: BLOOD RIGHT WRIST  Result Value Ref Range Status   Specimen Description BLOOD RIGHT WRIST  Final   Special Requests   Final    BOTTLES DRAWN AEROBIC AND ANAEROBIC Blood Culture results may not be optimal due to an excessive volume of blood received in culture bottles   Culture   Final    NO GROWTH 6 DAYS Performed at [facility], [place] [address], [place], [place] [address]    Report  Status 04/02/2019 FINAL  Final  Urine culture     Status: Abnormal   Collection Time: 04/08/2019 11:15 PM   Specimen: Urine, Random  Result Value Ref Range Status   Specimen Description URINE, RANDOM  Final   Special Requests NONE  Final   Culture (A)  Final    <10,000 COLONIES/mL INSIGNIFICANT GROWTH Performed at [facility], [place] [address] [place], [place] [address]    Report Status 03/29/2019 FINAL  Final  MRSA PCR Screening     Status: None   Collection Time: 03/28/19  8:34 AM   Specimen: Nasal Mucosa; Nasopharyngeal  Result Value Ref Range Status   MRSA by PCR NEGATIVE NEGATIVE Final    Comment:        The GeneXpert MRSA Assay (FDA approved for NASAL specimens only), is one component of a comprehensive MRSA colonization surveillance program. It is not intended to diagnose MRSA infection nor to guide or monitor treatment for MRSA infections. Performed at [facility], [place] [address] [place], [place] [address]   C difficile quick scan w PCR reflex     Status: None   Collection Time: 03/29/19  2:47 AM   Specimen: STOOL  Result Value Ref Range Status   C Diff antigen NEGATIVE NEGATIVE Final   C Diff toxin NEGATIVE NEGATIVE Final   C Diff interpretation No C. difficile detected.  Final    Comment: Performed at [facility], [place] [address] [place], [place] [address]  GI pathogen panel by PCR, stool     Status: None   Collection Time: 03/29/19  8:17 AM   Specimen: Stool  Result Value Ref Range Status   Plesiomonas shigelloides NOT PERFORMED  Final    Comment: Test not performed   Yersinia  enterocolitica NOT PERFORMED  Final    Comment: Test not performed   Vibrio NOT PERFORMED  Final    Comment: Test not performed   Enteropathogenic E coli NOT PERFORMED  Final    Comment: Test not performed   E coli (ETEC) LT/ST NOT PERFORMED  Final    Comment: Test not performed   E coli 0157 by PCR NOT PERFORMED  Final    Comment: Test not performed    Cryptosporidium by PCR NOT PERFORMED  Final    Comment: Test not performed   Entamoeba histolytica NOT PERFORMED  Final    Comment: Test not performed   Adenovirus F 40/41 NOT PERFORMED  Final    Comment: Test not performed   Norovirus GI/GII NOT PERFORMED  Final    Comment: Test not performed   Sapovirus NOT PERFORMED  Final    Comment: (NOTE) Test not performed Performed At: [facility] [place], [place] [id number] Rush Farmer MD JA:[id number]    Vibrio cholerae NOT PERFORMED  Final    Comment: Test not performed   Campylobacter by PCR ALP3  Final    Comment: (NOTE) The specimen submitted does not meet the laboratory's criteria for acceptability. Refer to [employer] [facility] Services for specimen acceptability criteria.      Required: ParaPak Orange      Received: Sterile Container      Mayra Neer was notified 03/30/2019.    Salmonella by PCR NOT PERFORMED  Final    Comment: Test not performed   E coli (STEC) NOT PERFORMED  Final    Comment: Test not performed   Enteroaggregative E coli NOT PERFORMED  Final    Comment: Test not performed   Shigella by PCR NOT PERFORMED  Final    Comment: Test not performed   Cyclospora cayetanensis NOT PERFORMED  Final    Comment: Test not performed   Astrovirus NOT PERFORMED  Final    Comment: Test not performed   G lamblia by PCR NOT PERFORMED  Final    Comment: Test not performed   Rotavirus A by PCR NOT PERFORMED  Final    Comment: Test not performed CALLED TO L.KING,RN AT 1732 03/30/19 BY L.PITT  Performed at [facility], [place] [address], [place], [place] [address]       Radiology Studies: No results found.   Scheduled Meds: . aspirin EC  81 mg Oral Daily  . atorvastatin  80 mg Oral Daily  . calcitRIOL  0.5 mcg Oral Once per day on Tue Thu Sat   And  . calcitRIOL  1 mcg Oral Once per day on Sun Mon Wed Fri  . calcium carbonate  1 tablet Oral BID WC  . Chlorhexidine Gluconate Cloth  6  each Topical Daily  . furosemide  40 mg Intravenous Daily  . influenza vaccine adjuvanted  0.5 mL Intramuscular Tomorrow-1000  . mouth rinse  15 mL Mouth Rinse BID  . mycophenolate  1,000 mg Oral BID  . pneumococcal 23 valent vaccine  0.5 mL Intramuscular Tomorrow-1000  . potassium chloride  40 mEq Oral Daily  . sodium bicarbonate  1,300 mg Oral BID  . tamsulosin  0.4 mg Oral Daily  . Warfarin - Pharmacist Dosing Inpatient   Does not apply q1800   Continuous Infusions: . sodium chloride 250 mL (04/04/19 1147)  . cefTRIAXone (ROCEPHIN)  IV 2 g (04/05/19 1001)  . DAPTOmycin (CUBICIN)  IV 800 mg (04/05/19 2130)  . heparin  1,350 Units/hr (04/05/19 1011)  . magnesium sulfate bolus IVPB       LOS: 10 days    Time spent: 60 minutes spent in the coordination of care today.    Jonnie Finner, DO [facility] Pager [phone]  If 7PM-7AM, please contact night-coverage [email or web address] Password [email or web address] 04/06/2019, 7:19 AM

## 2019-04-06 NOTE — Consult Note (Signed)
John Barton [birth year]  [id number].    Requesting MD: Dr. [name] Chief Complaint/Reason for Consult: retroperitoneal hemorrhage  HPI:  This is 73 yo white male with multiple medical problems including a solitary kidney, s/p renal tx on Cellcept with STage III CKD, mechanical heart valve on anticoagulation, amongst other things who was admitted last week secondary to fevers and an elevated WBC.  He has had a negative work up for endocarditis with a negative TEE, but ID is still treating him as such with Daptomycin and ceftriaxone.    Overnight he developed abdominal pain on the left side.  High hgb was 10.5 yesterday and dropped to 8.5 this morning.  His BP began dropping significantly and a hgb was rechecked and found to be 6.7.  He was transferred to the unit and has received 4 units of pRBCs and 2 units of FFP.  His BP is currently stable at 114/60s not on pressor support.  He underwent a non-contrasted CT scan that revealed a large RTP hemorrhage.  We have been asked to see the patient for further recommendations.  ROS: ROS: Please see HPI, otherwise all other systems have currently negative.  Family History  Problem Relation Age of Onset   Heart failure Father    Heart disease Father    Hyperlipidemia Father    Hypertension Father    Diabetes Father    Arthritis Mother    Heart disease Mother    Hyperlipidemia Mother    Hypertension Mother    Heart failure Mother    Heart disease Brother    Diabetes Daughter    Alcohol abuse Brother    Liver disease Brother        Liver transplant, ETOH    Past Medical History:  Diagnosis Date   Arthritis    Bifascicular block    First degree AV block, right bundle branch block left anterior fascicular block.  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 Jimmy Footman.  GFR stable at 55 ml/min (Cr 1.28) 07/16/17.  Neph f/u 08/08/17, Cr up to 1.71, GFR 39 (in context of vol ovld & imp bladder emptying b/c out of flomax x 1 wk).  Cr  1.38 and GFR 50 ml/min as of 08/21/17 renal f/u.  Cr 1.27, GFR 56 on renal f/u 10/01/17. Cr 1.24, GFR 57 on 01/06/18. Cr 1.41, GFR 49 Jan 2020. Stab GFR 09/2018    COPD (chronic obstructive pulmonary disease) ([place])    Coronary artery disease    CABG 1995.  Prior inferior MI noted on Myoview 2010   Diabetes mellitus without complication ([place]) 08/1592   New dx 10/15/16 by A1c criteria (6.6%)--recommended nutritionist referral/no meds at initial dx.   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 + Hx of a-fib as well.  Coumadin mgmt --nephrology   History of renal transplant 1988   Hyperlipidemia    Hypertension    ICD (implantable cardioverter-defibrillator) in place    Normal device check 01/28/16.   Ischemic cardiomyopathy    Ischemic heart disease and mild depressed LV and RV function + DD and mild pulm HTN   Post-transplant  erythrocytosis    S/P aortic valve replacement 1995   mechanical (for bicuspid aortic valve)--coumadin managed by nephrology.  //  Echo 2/18: mild LVH, EF 45-50, diff HK, Gr 1 DD, severe MAC, mild Mitral Stenosis, normally functioning mechanical AVR (mean 12, peak 25).  Per cards, repeat echo planned for 2020.   Secondary hyperparathyroidism ([place])    parathyroidectomy done; subsequently has hypoparathyroidism, takes vit D   Sinus bradycardia    Syncope    He is s/p loop recorder insertion; as of Dr. Olin Pia f/u 01/16/16 he has had no further syncope and no significant arrhythmias.  He elected to keep the loop recorder in and was told to f/u 1 yr with Dr. Caryl Comes.   NORMAL DEVICE REMOTE  TRANSMISSION--NO ATRIAL FIBRILLATION--as of 09/02/15.    Past Surgical History:  Procedure Laterality Date   AORTIC VALVE REPLACEMENT  1995   ST.Jude mechanical valve   CARDIAC CATHETERIZATION  1995   CARDIAC EVENT MONITOR  07/2017   PVCs, sinus brady---no significant abnormal findings.   CORONARY ARTERY BYPASS GRAFT  1995   RCA andLAD   ELECTROPHYSIOLOGY STUDY N/A 09/11/2012   Procedure: ELECTROPHYSIOLOGY STUDY;  Surgeon: Deboraha Sprang, MD;  Location[facility] C[facility] LAB;  Service: Cardiovascular;  Laterality: N/A;   KIDNEY TRANSPLANT  09/10/1996   (born w/1 kidney).  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  2018: EF 45-50%, normal functioning prosthetic AV, diffuse hypokinesis, grd I DD, mild mitral stenosis.  10/2017: EF 40-45%, global hypokinesis, grd II DD, mild MR, severe LAE, mild RV dysfxn, mild inc pulm press.    Social History:  reports that he quit smoking about 4 years ago. His smoking use included cigars. He has never used smokeless tobacco. He reports current alcohol use of about 14.0 standard drinks of alcohol per week. He reports that he does not use drugs.  Allergies:  Allergies  Allergen Reactions   Vancomycin     Acute renal failure and h/o renal transplant. DO NOT GIVE!    Medications Prior to Admission  Medication Sig Dispense Refill   allopurinol (ZYLOPRIM) 100 MG tablet Take 100-200 mg by mouth 2 (two) times daily. 1 tablet by mouth in the morning 2 tablets by mouth at night     aspirin EC 81 MG tablet Take 1 tablet (81 mg total) by mouth daily. 90 tablet 3    atorvastatin (LIPITOR) 80 MG tablet Take 1 tablet (80 mg total) by mouth daily. 90 tablet 3   calcitRIOL (ROCALTROL) 0.5 MCG capsule Take 0.5 mcg by mouth daily.      furosemide (LASIX) 40 MG tablet Take 40 mg by mouth daily.     lisinopril (PRINIVIL,ZESTRIL) 5 MG tablet Take 5 mg by mouth daily.     Multiple Vitamins-Minerals (OCUVITE PO) Take 1 tablet by mouth 2 (two) times a day.      mycophenolate (CELLCEPT) 500 MG tablet Take 1,000 mg by mouth 2 (two) times daily.      Naproxen Sodium (ALEVE PO) Take 1 tablet by mouth daily as needed (pain).     tamsulosin (FLOMAX) 0.4 MG CAPS capsule Take 0.4 mg by mouth daily.  warfarin (COUMADIN) 2 MG tablet Take 7 mg by mouth See admin instructions. Tuesday & Thursday patient takes a  total dose of 7 mg. Patient takes with 5 mg to equal total dose of 7 mg.     warfarin (COUMADIN) 5 MG tablet Take 5 mg by mouth See admin instructions. Monday,Wednesday,Friday,Saturday & Sunday patient takes 5 mg.  On Tuesday and Thursday patient takes 7 mg.  10     Physical Exam: Blood pressure (!) 87/74, pulse 86, temperature (!) 97.5 F (36.4 C), temperature source Oral, resp. rate 18, height 5' 10.5" (1.791 m), weight 92.9 kg, SpO2 100 %. General: pleasant, WD, WN white male who is laying in bed in NAD HEENT: head is normocephalic, atraumatic.  Sclera are noninjected.  PERRL.  Ears and nose without any masses or lesions.  Mouth is pink and moist Heart: regular, rate, and rhythm.  Normal s1,s2. No obvious murmurs, gallops, or rubs noted.  Palpable radial and pedal pulses bilaterally Lungs: CTAB, no wheezes, rhonchi, or rales noted.  Respiratory effort nonlabored Abd: soft, distention and very tender on the left side of his abdomen, +BS, no masses.  Reducible umbilical hernia. MS: all 4 extremities are symmetrical with no cyanosis, clubbing, but he has significant lower extremity edema Skin: warm and dry with no masses, lesions, or rashes, slightly pale  appearing Psych: A&Ox3 with an appropriate affect.   Results for orders placed or performed during the hospital encounter of 03/21/2019 (from the past 48 hour(s))  Protime-INR     Status: Abnormal   Collection Time: 04/05/19  6:01 AM  Result Value Ref Range   Prothrombin Time 17.4 (H) 11.4 - 15.2 seconds   INR 1.4 (H) 0.8 - 1.2    Comment: (NOTE) INR goal varies based on device and disease states. Performed at [facility], [place] [address] [place], [place] [address]   Renal function panel     Status: Abnormal   Collection Time: 04/05/19  6:01 AM  Result Value Ref Range   Sodium 141 135 - 145 mmol/L   Potassium 3.3 (L) 3.5 - 5.1 mmol/L   Chloride 105 98 - 111 mmol/L   CO2 25 22 - 32 mmol/L   Glucose, Bld 124 (H) 70 - 99 mg/dL   BUN 25 (H) 8 - 23 mg/dL   Creatinine, Ser 1.60 (H) 0.61 - 1.24 mg/dL   Calcium 7.4 (L) 8.9 - 10.3 mg/dL   Phosphorus 2.0 (L) 2.5 - 4.6 mg/dL   Albumin 3.1 (L) 3.5 - 5.0 g/dL   GFR calc non Af Amer 42 (L) >60 mL/min   GFR calc Af Amer 48 (L) >60 mL/min   Anion gap 11 5 - 15    Comment: Performed at [facility] [address], [place], [place] [address]  CBC     Status: Abnormal   Collection Time: 04/05/19  6:01 AM  Result Value Ref Range   WBC 11.5 (H) 4.0 - 10.5 K/uL   RBC 3.65 (L) 4.22 - 5.81 MIL/uL   Hemoglobin 10.5 (L) 13.0 - 17.0 g/dL   HCT 32.3 (L) 39.0 - 52.0 %   MCV 88.5 80.0 - 100.0 fL   MCH 28.8 26.0 - 34.0 pg   MCHC 32.5 30.0 - 36.0 g/dL   RDW 14.6 11.5 - 15.5 %   Platelets 240 150 - 400 K/uL   nRBC 0.0 0.0 - 0.2 %    Comment: Performed at [facility], [place] [address],  [place], [place] [address]  Heparin level (unfractionated)     Status: Abnormal   Collection Time: 04/05/19  6:01 AM  Result Value Ref Range   Heparin Unfractionated 0.82 (H) 0.30 - 0.70 IU/mL    Comment: (NOTE) If heparin results are below expected values, and patient dosage has  been confirmed, suggest follow up testing of antithrombin III  levels. Performed at [facility], [place] [address], [place], [place] [address]   Magnesium     Status: Abnormal   Collection Time: 04/05/19  6:01 AM  Result Value Ref Range   Magnesium 1.4 (L) 1.7 - 2.4 mg/dL    Comment: Performed at [facility] [address] [place], [place] [address]  Glucose, capillary     Status: Abnormal   Collection Time: 04/05/19  7:30 AM  Result Value Ref Range   Glucose-Capillary 122 (H) 70 - 99 mg/dL   Comment 1 Notify RN    Comment 2 Document in Chart   Heparin level (unfractionated)     Status: None   Collection Time: 04/05/19  3:28 PM  Result Value Ref Range   Heparin Unfractionated 0.53 0.30 - 0.70 IU/mL    Comment: (NOTE) If heparin results are below expected values, and patient dosage has  been confirmed, suggest follow up testing of antithrombin III levels. Performed at [facility], [place] [address] [place], [place] [address]   Protime-INR     Status: Abnormal   Collection Time: 04/06/19  4:28 AM  Result Value Ref Range   Prothrombin Time 20.2 (H) 11.4 - 15.2 seconds   INR 1.8 (H) 0.8 - 1.2    Comment: (NOTE) INR goal varies based on device and disease states. Performed at [facility], [place] [address] [place], [place] [address]   Renal function panel     Status: Abnormal   Collection Time: 04/06/19  4:28 AM  Result Value Ref Range   Sodium 141 135 - 145 mmol/L   Potassium 3.5 3.5 - 5.1 mmol/L   Chloride 106 98 - 111 mmol/L   CO2 25 22 - 32 mmol/L   Glucose, Bld 173 (H) 70 - 99 mg/dL   BUN 24 (H) 8 - 23 mg/dL   Creatinine, Ser 1.82 (H) 0.61 - 1.24 mg/dL   Calcium 6.9 (L) 8.9 - 10.3 mg/dL   Phosphorus 3.8 2.5 - 4.6 mg/dL   Albumin 2.9 (L) 3.5 - 5.0 g/dL   GFR calc non Af Amer 36 (L) >60 mL/min   GFR calc Af Amer 41 (L) >60 mL/min   Anion gap 10 5 - 15    Comment: Performed at [facility] [address] [place], [place] [address]  Heparin level (unfractionated)     Status: None   Collection Time:  04/06/19  4:28 AM  Result Value Ref Range   Heparin Unfractionated 0.46 0.30 - 0.70 IU/mL    Comment: (NOTE) If heparin results are below expected values, and patient dosage has  been confirmed, suggest follow up testing of antithrombin III levels. Performed at [facility], [place] [address], [place] [address]   CBC     Status: Abnormal   Collection Time: 04/06/19  4:28 AM  Result Value Ref Range   WBC 13.5 (H) 4.0 - 10.5 K/uL   RBC 3.03 (L) 4.22 - 5.81 MIL/uL   Hemoglobin 8.5 (L) 13.0 - 17.0 g/dL   HCT 27.4 (L) 39.0 - 52.0 %  MCV 90.4 80.0 - 100.0 fL   MCH 28.1 26.0 - 34.0 pg   MCHC 31.0 30.0 - 36.0 g/dL   RDW 14.6 11.5 - 15.5 %   Platelets 239 150 - 400 K/uL   nRBC 0.0 0.0 - 0.2 %    Comment: Performed at [facility], [place] [address], [place], [place] [address]  Magnesium     Status: Abnormal   Collection Time: 04/06/19  4:28 AM  Result Value Ref Range   Magnesium 1.4 (L) 1.7 - 2.4 mg/dL    Comment: Performed at [facility] [address], [place], [place] [address]  Lipase, blood     Status: Abnormal   Collection Time: 04/06/19 10:24 AM  Result Value Ref Range   Lipase 60 (H) 11 - 51 U/L    Comment: Performed at [facility] [address] [place], [place] [address]  Glucose, capillary     Status: Abnormal   Collection Time: 04/06/19 10:44 AM  Result Value Ref Range   Glucose-Capillary 228 (H) 70 - 99 mg/dL  Hepatic function panel     Status: Abnormal   Collection Time: 04/06/19 11:59 AM  Result Value Ref Range   Total Protein 4.8 (L) 6.5 - 8.1 g/dL   Albumin 2.6 (L) 3.5 - 5.0 g/dL   AST 25 15 - 41 U/L   ALT 22 0 - 44 U/L   Alkaline Phosphatase 54 38 - 126 U/L   Total Bilirubin 0.4 0.3 - 1.2 mg/dL   Bilirubin, Direct <0.1 0.0 - 0.2 mg/dL   Indirect Bilirubin NOT CALCULATED 0.3 - 0.9 mg/dL    Comment: Performed at [facility], [place] [address] [place], [place] [address]  Hemoglobin and hematocrit, blood     Status: Abnormal    Collection Time: 04/06/19 11:59 AM  Result Value Ref Range   Hemoglobin 6.6 (LL) 13.0 - 17.0 g/dL    Comment: REPEATED TO VERIFY THIS CRITICAL RESULT HAS VERIFIED AND BEEN CALLED TO S.PUTAM,RN BY BONNIE DAVIS ON 10 19 2020 AT 1234, AND HAS BEEN READ BACK.     HCT 21.5 (L) 39.0 - 52.0 %    Comment: Performed at [facility], [place] [address], [place], [place] [address]  Type and screen [place]     Status: None (Preliminary result)   Collection Time: 04/06/19 12:21 PM  Result Value Ref Range   ABO/RH(D) A NEG    Antibody Screen NEG    Sample Expiration      04/09/2019,2359 Performed at [facility], [place] [address], [place], [place] [address]    Unit Number [id number]    Blood Component Type RED CELLS,LR    Unit division 00    Status of Unit ISSUED    Unit tag comment VERBAL ORDERS PER DR GONZALEZ    Transfusion Status OK TO TRANSFUSE    Crossmatch Result COMPATIBLE    Unit Number [id number]    Blood Component Type RED CELLS,LR    Unit division 00    Status of Unit ISSUED    Unit tag comment VERBAL ORDERS PER DR GONZALEZ    Transfusion Status OK TO TRANSFUSE    Crossmatch Result COMPATIBLE    Unit Number [id number]    Blood Component Type RED CELLS,LR    Unit division 00    Status of Unit ISSUED    Unit tag comment VERBAL ORDERS PER DR Patsey Berthold    Transfusion Status  OK TO TRANSFUSE    Crossmatch Result COMPATIBLE    Unit Number [id number]    Blood Component Type RED CELLS,LR    Unit division 00    Status of Unit ISSUED    Unit tag comment VERBAL ORDERS PER DR GONZALEZ    Transfusion Status OK TO TRANSFUSE    Crossmatch Result COMPATIBLE    Unit Number [id number]    Blood Component Type RED CELLS,LR    Unit division 00    Status of Unit ISSUED    Unit tag comment VERBAL ORDERS PER DR GONZALEZ    Transfusion Status OK TO TRANSFUSE    Crossmatch Result COMPATIBLE    Unit Number [id number]    Blood Component Type RED  CELLS,LR    Unit division 00    Status of Unit ISSUED    Unit tag comment VERBAL ORDERS PER DR GONZALEZ    Transfusion Status OK TO TRANSFUSE    Crossmatch Result COMPATIBLE    Unit Number [id number]    Blood Component Type RED CELLS,LR    Unit division 00    Status of Unit ISSUED    Unit tag comment VERBAL ORDERS PER DR GONZALEZ    Transfusion Status OK TO TRANSFUSE    Crossmatch Result COMPATIBLE    Unit Number [id number]    Blood Component Type RED CELLS,LR    Unit division 00    Status of Unit ISSUED    Unit tag comment VERBAL ORDERS PER DR GONZALEZ    Transfusion Status OK TO TRANSFUSE    Crossmatch Result COMPATIBLE    Unit Number [id number]    Blood Component Type RED CELLS,LR    Unit division 00    Status of Unit ISSUED    Unit tag comment VERBAL ORDERS PER DR GONZALEZ    Transfusion Status OK TO TRANSFUSE    Crossmatch Result COMPATIBLE    Unit Number [id number]    Blood Component Type RED CELLS,LR    Unit division 00    Status of Unit ISSUED    Unit tag comment VERBAL ORDERS PER DR GONZALEZ    Transfusion Status OK TO TRANSFUSE    Crossmatch Result COMPATIBLE    Unit Number [id number]    Blood Component Type RED CELLS,LR    Unit division 00    Status of Unit ISSUED    Unit tag comment VERBAL ORDERS PER DR GONZALEZ    Transfusion Status OK TO TRANSFUSE    Crossmatch Result COMPATIBLE    Unit Number [id number]    Blood Component Type RED CELLS,LR    Unit division 00    Status of Unit ISSUED    Unit tag comment VERBAL ORDERS PER DR GONZALEZ    Transfusion Status OK TO TRANSFUSE    Crossmatch Result COMPATIBLE    Unit Number [id number]    Blood Component Type RED CELLS,LR    Unit division 00    Status of Unit ALLOCATED    Transfusion Status OK TO TRANSFUSE    Crossmatch Result COMPATIBLE    Unit Number [id number]    Blood Component Type RED CELLS,LR    Unit division 00    Status of Unit ALLOCATED    Transfusion Status OK TO  TRANSFUSE    Crossmatch Result COMPATIBLE    Unit Number [id number]    Blood Component Type RED CELLS,LR    Unit division 00    Status of Unit ALLOCATED    Transfusion Status OK TO TRANSFUSE    Crossmatch  Result COMPATIBLE    Unit Number [id number]    Blood Component Type RBC LR PHER2    Unit division 00    Status of Unit ALLOCATED    Transfusion Status OK TO TRANSFUSE    Crossmatch Result COMPATIBLE   Prepare RBC     Status: None   Collection Time: 04/06/19 12:21 PM  Result Value Ref Range   Order Confirmation      ORDER PROCESSED BY BLOOD BANK Performed at [facility], [place] [address] [place], [place] [address]   ABO/Rh     Status: None (Preliminary result)   Collection Time: 04/06/19 12:21 PM  Result Value Ref Range   ABO/RH(D)      A NEG Performed at [facility] [address], [place], [place] [address]   Prepare platelet pheresis     Status: None (Preliminary result)   Collection Time: 04/06/19 12:21 PM  Result Value Ref Range   Unit Number [id number]    Blood Component Type PLTPHER LR1    Unit division 00    Status of Unit ISSUED    Transfusion Status      OK TO TRANSFUSE Performed at [facility] [address], [place], [place] [address]   Initiate MTP (Blood Bank Notification)     Status: None   Collection Time: 04/06/19  1:01 PM  Result Value Ref Range   Initiate Massive Transfusion Protocol      MTP ORDER RECEIVED Performed at [facility], [place] [address], [place], [place] [address]   Prepare fresh frozen plasma     Status: None (Preliminary result)   Collection Time: 04/06/19  1:03 PM  Result Value Ref Range   Unit Number [id number]    Blood Component Type THAWED PLASMA    Unit division 00    Status of Unit ISSUED    Transfusion Status OK TO TRANSFUSE    Unit Number [id number]    Blood Component Type THAWED PLASMA    Unit division 00    Status of Unit ISSUED    Transfusion Status OK TO TRANSFUSE    Unit  Number [id number]    Blood Component Type THW PLS APHR    Unit division A0    Status of Unit ISSUED    Transfusion Status OK TO TRANSFUSE    Unit Number [id number]    Blood Component Type THAWED PLASMA    Unit division 00    Status of Unit ISSUED    Transfusion Status OK TO TRANSFUSE    Unit Number [id number]    Blood Component Type LIQ PLASMA    Unit division 00    Status of Unit ISSUED    Transfusion Status OK TO TRANSFUSE    Unit Number [id number]    Blood Component Type LIQ PLASMA    Unit division 00    Status of Unit ISSUED    Transfusion Status OK TO TRANSFUSE    Unit Number [id number]    Blood Component Type LIQ PLASMA    Unit division 00    Status of Unit ISSUED    Transfusion Status OK TO TRANSFUSE    Unit Number [id number]    Blood Component Type LIQ PLASMA    Unit division 00    Status of Unit ISSUED    Transfusion Status OK TO TRANSFUSE    Unit Number [id number]    Blood Component Type THAWED PLASMA    Unit division 00  Status of Unit ALLOCATED    Transfusion Status OK TO TRANSFUSE    Unit Number [id number]    Blood Component Type THAWED PLASMA    Unit division 00    Status of Unit ALLOCATED    Transfusion Status OK TO TRANSFUSE    Unit Number [id number]    Blood Component Type THAWED PLASMA    Unit division 00    Status of Unit ALLOCATED    Transfusion Status OK TO TRANSFUSE    Unit Number [id number]    Blood Component Type THAWED PLASMA    Unit division 00    Status of Unit ALLOCATED    Transfusion Status OK TO TRANSFUSE   Prepare cryoprecipitate     Status: None (Preliminary result)   Collection Time: 04/06/19  1:15 PM  Result Value Ref Range   Unit Number [id number]    Blood Component Type CRYPOOL THAW    Unit division 00    Status of Unit ISSUED    Transfusion Status      OK TO TRANSFUSE Performed at [facility], [address] [place], [place] [address]    Ct Abdomen Pelvis Wo Contrast  Result  Date: 04/06/2019 CLINICAL DATA:  Acute abdominal pain and left flank pain. EXAM: CT ABDOMEN AND PELVIS WITHOUT CONTRAST TECHNIQUE: Multidetector CT imaging of the abdomen and pelvis was performed following the standard protocol without IV contrast. COMPARISON:  CT scan dated 04/02/2019 FINDINGS: Other: There is a new left-sided large retroperitoneal inhomogeneous fluid collection measuring 22 x 11 x 23 cm extending from the level of the left hemidiaphragm to the left superior iliac crest. In addition, there is an inhomogeneous fluid collection in the left iliacus muscle measuring 9 x 3 x 3 cm extending to the level of the inguinal ligament. These fluid collections could represent subacute hematomas or abscesses. Lower chest: Extensive aortic atherosclerosis and coronary artery calcifications. New small left pleural effusion and tiny right effusion. Slight bibasilar atelectasis, new on the right. Hepatobiliary: Liver parenchyma is normal. Tiny stones in the otherwise normal gallbladder, unchanged. No biliary ductal dilatation. Pancreas: Normal. Spleen: Normal. Adrenals/Urinary Tract: Adrenal glands are normal. Chronic severely atrophic right kidney. Left kidney is been removed. There is a transplant kidney in the left lower quadrant with no hydronephrosis. There is a 22 mm stone in the bladder, unchanged. Stomach/Bowel: No acute abnormality of the bowel. Sigmoid diverticulosis. There is a small amount of fluid in the left pericolic gutter. 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 Extensive aortic atherosclerosis. No adenopathy. Reproductive: Prostate is unremarkable. Musculoskeletal: There is soft tissue stranding around the muscles of the lateral and posterolateral aspects of the left anterior abdominal wall adjacent to the extensive new fluid collections in the left side of the abdomen. There is new subcutaneous edema in the left flank and lateral aspect of both hips, left  greater than right. IMPRESSION: 1. New large inhomogeneous fluid collections in the left side of the abdomen and pelvis as described above. These could represent subacute hematomas or abscesses. 2. New subcutaneous edema in the left flank and lateral aspect of both hips, left greater than right. 3. 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 Electronically Signed   By: Lorriane Shire M.D.   On: 04/06/2019 12:48   Dg Chest Port 1 View  Result Date: 04/06/2019 CLINICAL DATA:  Line placement EXAM: PORTABLE CHEST 1 VIEW COMPARISON:  03/23/2019 FINDINGS: Interval placement of a right neck vascular sheath, tip  projecting in the vicinity of the right brachiocephalic vein. Otherwise unchanged AP portable examination with cardiomegaly status post median sternotomy and left basilar scarring or atelectasis. No acute appearing airspace opacity. IMPRESSION: 1. Interval placement of a right neck vascular sheath, tip projecting in the vicinity of the right brachiocephalic vein. 2. Otherwise unchanged AP portable examination with cardiomegaly status post median sternotomy and left basilar scarring or atelectasis. No acute appearing airspace opacity. Electronically Signed   By: Eddie Candle M.D.   On: 04/06/2019 14:27      Assessment/Plan MMP  Retroperitoneal hematoma The patient has a large RTP hematoma. This is new since overnight and likely spontaneous secondary to anticoagulation even though his INR has been under 2 for the last several days.  He will need to be transfused as needed to correct his anemia.  This will ultimately tamponade itself off after correction of anticoagulation and time.  IR has been asked regarding angiogram with embolization for this situation, but feel that in regards to RTP hematomas, the vessels are usually difficult to find to determine what should be embolized or unable to find them and that this was not a good option.  It is also likely not a great option given his kidney  situation.  No acute surgical intervention is recommended for this situation either.  Treatment is outlined as above.  This has been discussed with the primary CCM team as well as the patient.  Will defer further care to medical service.  We will sign off.   FEN - NPO VTE - on hold, being reversed ID - Dapto/[email or web address]  Henreitta Cea, [facility] Surgery 04/06/2019, 2:40 PM Please see Amion for pager number during day hours 7:00am-4:30pm

## 2019-04-06 NOTE — Procedures (Signed)
Central Venous Catheter Insertion Procedure Note John Barton [id number] Jan 20, 1946  Procedure: Insertion of Central Venous Catheter, Cordis introducer catheter Indications: Drug and/or fluid administration  Blood transfusion  Procedure Details Consent: Risks of procedure as well as the alternatives and risks of each were explained to the (patient/caregiver).  Consent for procedure obtained. Time Out: Verified patient identification, verified procedure, site/side was marked, verified correct patient position, special equipment/implants available, medications/allergies/relevent history reviewed, required imaging and test results available.  Performed  Maximum sterile technique was used including antiseptics, cap, gloves, gown, hand hygiene, mask and sheet. Skin prep: Chlorhexidine; local anesthetic administered A antimicrobial bonded/coated single lumen catheter was placed in the left internal jugular vein using the Seldinger technique.  Evaluation Blood flow good Complications: No apparent complications Patient did tolerate procedure well. Chest X-ray ordered to verify placement.  CXR: normal.   John Barton 04/06/2019, 2:44 PM

## 2019-04-06 NOTE — Progress Notes (Signed)
CRITICAL VALUE ALERT  Critical Value:  Calcium 6.2  Date & Time Notied:  04/06/2019 1742  Provider Notified: Dr. Patsey Berthold  Orders Received/Actions taken: None

## 2019-04-06 NOTE — Progress Notes (Signed)
[facility] for Infectious Disease  Date of Admission:  04/02/2019     Total days of antibiotics 11         ASSESSMENT:  John Barton has new onset abdominal pain of unclear origin and awaiting abdominal x-ray to rule out obstruction or constipation. From ID perspective he has remained stable. Will plan for treatment with 6 weeks of IV therapy with Daptomycin and ceftriaxone for culture negative endocarditis despite TEE not showing any vegetations. Will arrange for central line placement and home IV therapy. OPAT orders placed below and will plan for follow up in ID clinic.  PLAN:  1. Continue daptomycin and ceftriaxone. 2. Place central line when able for home health therapy. 3. Monitor CK levels while on daptomycin. 4. Plan for F/U in ID office.  ADDENDUM: Nursing notes reviewed and events leading up to ICU transfer reviewed. Will continue current plan of Daptomycin and ceftriaxone. Hold OPAT orders pending admission to ICU.   SUBJECTIVE:  Afebrile overnight. Mild increase in WBC count. Now having new onset left sided abdominal pain since yesterday. Has had multiple small bowel movements. Awaiting abdominal x-ray but resting comfortably at present.   Allergies  Allergen Reactions  . Vancomycin     Acute renal failure and h/o renal transplant. DO NOT GIVE!     Review of Systems: Review of Systems  Constitutional: Negative for chills, fever and weight loss.  Respiratory: Negative for cough, shortness of breath and wheezing.   Cardiovascular: Negative for chest pain and leg swelling.  Gastrointestinal: Positive for abdominal pain. Negative for constipation, diarrhea, nausea and vomiting.  Skin: Negative for rash.      OBJECTIVE: Vitals:   04/06/19 1131 04/06/19 1151 04/06/19 1201 04/06/19 1211  BP: (!) 75/51 (!) 81/48 (!) 79/62 (!) 76/57  Pulse:  (!) 40 79 76  Resp:      Temp:      TempSrc:      SpO2:  100%  100%  Weight:      Height:       Body mass index  is 28.97 kg/m.  Physical Exam Constitutional:      General: He is not in acute distress.    Appearance: Normal appearance. He is well-developed. He is not ill-appearing.     Comments: Lying in bed with head of bed elevated; pleasant.   Cardiovascular:     Rate and Rhythm: Normal rate and regular rhythm.     Heart sounds: Normal heart sounds.  Pulmonary:     Effort: Pulmonary effort is normal.     Breath sounds: Normal breath sounds.  Abdominal:     General: Bowel sounds are decreased. There is distension.  Skin:    General: Skin is warm and dry.  Neurological:     Mental Status: He is alert and oriented to person, place, and time.  Psychiatric:        Behavior: Behavior normal.        Thought Content: Thought content normal.        Judgment: Judgment normal.     Lab Results Lab Results  Component Value Date   WBC 13.5 (H) 04/06/2019   HGB 6.6 (LL) 04/06/2019   HCT 21.5 (L) 04/06/2019   MCV 90.4 04/06/2019   PLT 239 04/06/2019    Lab Results  Component Value Date   CREATININE 1.82 (H) 04/06/2019   BUN 24 (H) 04/06/2019   NA 141 04/06/2019   K 3.5 04/06/2019   CL 106 04/06/2019  CO2 25 04/06/2019    Lab Results  Component Value Date   ALT 22 04/06/2019   AST 25 04/06/2019   ALKPHOS 54 04/06/2019   BILITOT 0.4 04/06/2019     Microbiology: Recent Results (from the past 240 hour(s))  SARS CORONAVIRUS 2 (TAT 6-24 HRS) Nasopharyngeal Nasopharyngeal Swab     Status: None   Collection Time: 03/26/2019  6:23 PM   Specimen: Nasopharyngeal Swab  Result Value Ref Range Status   SARS Coronavirus 2 NEGATIVE NEGATIVE Final    Comment: (NOTE) SARS-CoV-2 target nucleic acids are NOT DETECTED. The SARS-CoV-2 RNA is generally detectable in upper and lower respiratory specimens during the acute phase of infection. Negative results do not preclude SARS-CoV-2 infection, do not rule out co-infections with other pathogens, and should not be used as the sole basis for treatment  or other patient management decisions. Negative results must be combined with clinical observations, patient history, and epidemiological information. The expected result is Negative. Fact Sheet for Patients: SugarRoll.be Fact Sheet for Healthcare Providers: https://www.woods-mathews.com/ This test is not yet approved or cleared by the [place] FDA and  has been authorized for detection and/or diagnosis of SARS-CoV-2 by FDA under an Emergency Use Authorization (EUA). This EUA will remain  in effect (meaning this test can be used) for the duration of the COVID-19 declaration under Section 56 4(b)(1) of the Act, 21 U.S.C. section 360bbb-3(b)(1), unless the authorization is terminated or revoked sooner. Performed at [facility], [place] [address], [place], [place] [address]   Culture, blood (routine x 2)     Status: None   Collection Time: 03/20/2019  6:47 PM   Specimen: BLOOD RIGHT ARM  Result Value Ref Range Status   Specimen Description BLOOD RIGHT ARM  Final   Special Requests   Final    BOTTLES DRAWN AEROBIC AND ANAEROBIC Blood Culture adequate volume   Culture   Final    NO GROWTH 6 DAYS Performed at [facility], [place] [address] [place], [place] [address]    Report Status 04/02/2019 FINAL  Final  Culture, blood (routine x 2)     Status: None   Collection Time: 03/28/2019  7:00 PM   Specimen: BLOOD RIGHT WRIST  Result Value Ref Range Status   Specimen Description BLOOD RIGHT WRIST  Final   Special Requests   Final    BOTTLES DRAWN AEROBIC AND ANAEROBIC Blood Culture results may not be optimal due to an excessive volume of blood received in culture bottles   Culture   Final    NO GROWTH 6 DAYS Performed at [facility], [place] [address], [place], [place] [address]    Report Status 04/02/2019 FINAL  Final  Urine culture     Status: Abnormal   Collection Time: 04/04/2019 11:15 PM   Specimen: Urine, Random  Result Value  Ref Range Status   Specimen Description URINE, RANDOM  Final   Special Requests NONE  Final   Culture (A)  Final    <10,000 COLONIES/mL INSIGNIFICANT GROWTH Performed at [facility], [place] [address], [place], [place] [address]    Report Status 03/29/2019 FINAL  Final  MRSA PCR Screening     Status: None   Collection Time: 03/28/19  8:34 AM   Specimen: Nasal Mucosa; Nasopharyngeal  Result Value Ref Range Status   MRSA by PCR NEGATIVE NEGATIVE Final    Comment:        The GeneXpert MRSA Assay (FDA approved  for NASAL specimens only), is one component of a comprehensive MRSA colonization surveillance program. It is not intended to diagnose MRSA infection nor to guide or monitor treatment for MRSA infections. Performed at [facility], [place] [address], [place], [place] [address]   C difficile quick scan w PCR reflex     Status: None   Collection Time: 03/29/19  2:47 AM   Specimen: STOOL  Result Value Ref Range Status   C Diff antigen NEGATIVE NEGATIVE Final   C Diff toxin NEGATIVE NEGATIVE Final   C Diff interpretation No C. difficile detected.  Final    Comment: Performed at [facility], [place] [address] [place], [place] [address]  GI pathogen panel by PCR, stool     Status: None   Collection Time: 03/29/19  8:17 AM   Specimen: Stool  Result Value Ref Range Status   Plesiomonas shigelloides NOT PERFORMED  Final    Comment: Test not performed   Yersinia enterocolitica NOT PERFORMED  Final    Comment: Test not performed   Vibrio NOT PERFORMED  Final    Comment: Test not performed   Enteropathogenic E coli NOT PERFORMED  Final    Comment: Test not performed   E coli (ETEC) LT/ST NOT PERFORMED  Final    Comment: Test not performed   E coli 0157 by PCR NOT PERFORMED  Final    Comment: Test not performed   Cryptosporidium by PCR NOT PERFORMED  Final    Comment: Test not performed   Entamoeba histolytica NOT PERFORMED  Final    Comment: Test not performed    Adenovirus F 40/41 NOT PERFORMED  Final    Comment: Test not performed   Norovirus GI/GII NOT PERFORMED  Final    Comment: Test not performed   Sapovirus NOT PERFORMED  Final    Comment: (NOTE) Test not performed Performed At: [facility] [place], [place] [id number] Rush Farmer MD [id number]    Vibrio cholerae NOT PERFORMED  Final    Comment: Test not performed   Campylobacter by PCR [id number]  Final    Comment: (NOTE) The specimen submitted does not meet the laboratory's criteria for acceptability. Refer to [employer] [facility] Services for specimen acceptability criteria.      Required: ParaPak Orange      Received: Sterile Container      Mayra Neer was notified 03/30/2019.    Salmonella by PCR NOT PERFORMED  Final    Comment: Test not performed   E coli (STEC) NOT PERFORMED  Final    Comment: Test not performed   Enteroaggregative E coli NOT PERFORMED  Final    Comment: Test not performed   Shigella by PCR NOT PERFORMED  Final    Comment: Test not performed   Cyclospora cayetanensis NOT PERFORMED  Final    Comment: Test not performed   Astrovirus NOT PERFORMED  Final    Comment: Test not performed   G lamblia by PCR NOT PERFORMED  Final    Comment: Test not performed   Rotavirus A by PCR NOT PERFORMED  Final    Comment: Test not performed CALLED TO L.KING,RN AT 1732 03/30/19 BY L.PITT  Performed at [facility], [place] [address], [place], [place] [address]      Terri Piedra, [place] [facility] [phone] Pager  04/06/2019  1:45 PM

## 2019-04-06 NOTE — Consult Note (Signed)
NAME:  John Barton, MRN:  [id number], DOB:  09/26/1945, LOS: 87 ADMISSION DATE:  04/04/2019, CONSULTATION DATE: 04/06/19 REFERRING MD:  Dr. 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    Admitted on 10/10, febrile, getting treated for presumed endocarditis.   Cultures negative, TEE eventually was able to be done and was negative for vegetations.    On coumadin at home, was supratheraputic, coumadin reversed, now was getting bridged back to ac with coumadin, had been on heparin infusion.   This am became persistently hypotensive with drop in H/H.  Intermittent bradycardia.  CT abd done, large RP hematoma, heterogenous.     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 No vegetation seen  2. Left ventricular ejection fraction, by visual estimation, is 45 to 50%. The left ventricle has mildly decreased function. There is mildly increased left ventricular hypertrophy.  3. Global right ventricle has normal systolic function.The right ventricular size is normal.  4. Severe mitral annular calcification.  5. The mitral valve is degenerative. Mild to moderate mitral valve regurgitation.  6. The tricuspid valve is normal in  structure. Tricuspid valve regurgitation is mild.  7. Mechanical prosthesis in the aortic valve position. No paravalvular leak  8. The pulmonic valve was not well visualized. Pulmonic valve regurgitation is not visualized by color flow Doppler.  Micro Data:  10/9 Bl cx neg  Antimicrobials:   Ceftriaxone 10/14 - current Daptomycin 10/13 --> current  Cefepime 10/9-10/13  Interim history/subjective:  Has L sided abdominal pain  Objective   Blood pressure (!) 75/51, pulse 79, temperature (!) 97.5 F (36.4 C), temperature source Oral, resp. rate 20, height 5' 10.5" (1.791 m), weight 92.9 kg, SpO2 96 %.        Intake/Output Summary (Last 24 hours) at 04/06/2019 1207 Last data filed at 04/05/2019 2200 Gross per 24 hour  Intake 1317.42 ml  Output 880 ml  Net 437.42 ml   Filed Weights   04/04/19 0621 04/05/19 0509 04/06/19 0353  Weight: 92.5 kg 92.2 kg 92.9 kg    Examination: General: was initially pale, appeared uncomfortable HENT: NCAT Lungs: CTAB Cardiovascular: RRR no mgr Abdomen: Tender on L side, Distended.  + bowel sounds.  Graft not tender Extremities: no edema, no erythema Neuro: A and O x 3  Resolved Hospital Problem list    Assessment & Plan:  Hypotension: appears 2/2 hemorrhage CT revealed RP hemorrhage, large.   Stabilized after 4 u prbc and 2 u ffp given rapidly.  Heparin stopped.   Reversed with protamine, also given Kcentra and vitamin K.   BP stabilized after blood, continue transfusions for goal hb >8.   Repeat INR PT.  Discussed with surgery and IR.  Will Monitor closely for now.   Will likely tamponade.  Reviewed with IR, no extension into peritoneal space, all RP.  Does not think likely that would find source of bleed with IR study.  Will call back if further decompensates.    Leukocytosis and fever: Bl cx negative.  Cont abtx per ID.  Fluid collection heterogenous, loculated, pelvis with smaller fluid collection.  Possible old hemorrhage vs  abscess?   AKI, s/p kidney transplant.  Cr slightly elevated at 1.82 today.  Bladder scan with about 150cc urine.   Monitor closely.    Type 2 diabetes mellitus - A1c 6.6 in 2018 - glucose ok  Mechanical aortic valve Supratherapeutic INR - On Coumadin, INR 2.4 on presentation. Pharmacy to dose Coumadin to maintain INR between 2.5-3. -HOLD anticoagulation for now.   CAD s/p CABG 1995 Elevated troponin - Troponin was slightly elevated at 21 on admission. Currently not complaining of chest pain. Continue to monitor. - Home meds include aspirin, statin; continue  Combined systolic and diastolic CHF  History of hypertension - Echo 10/10, EF 45 to 50%, unchanged. - Home meds include Lasix 40 mg daily, lisinopril 5 mg daily. lisinopril hold because of hypotension and AKI. - May need lasix, currently good respiratory status.    Sinus bradycardia  - s/p loop recorder  BPH  - continue Flomax.     Best practice:  Diet:  Pain/Anxiety/Delirium protocol (if indicated): VAP protocol (if indicated:  DVT prophylaxis: SCDs GI prophylaxis:  Glucose control:  Mobility:  Code Status: Full Family Communication: Spoke with [name] Disposition:   Labs   CBC: Recent Labs  Lab 04/01/19 0534 04/02/19 0716 03/30/2019 0507 04/04/19 0503 04/05/19 0601 04/06/19 0428  WBC 11.7* 10.2 10.4 10.8* 11.5* 13.5*  NEUTROABS 10.8* 9.5* 8.5* 8.3*  --   --   HGB 10.3* 10.6* 10.7* 11.0* 10.5* 8.5*  HCT 33.2* 32.9* 32.3* 32.9* 32.3* 27.4*  MCV 90.7 88.9 87.8 87.7 88.5 90.4  PLT 150 172 186 193 240 962    Basic Metabolic Panel: Recent Labs  Lab 04/01/19 0534 04/02/19 0716 03/21/2019 0507 04/04/19 0503 04/05/19 0601 04/06/19 0428  NA 137  136 138 140 141 141 141  K 4.0  4.0 3.5 3.4* 3.2* 3.3* 3.5  CL 109  108 109 111 107 105 106  CO2 14*  14* 16* 21* _0 GLUCOSE 119*  120* 126* 130* 126* 124* 173*  BUN 86*  86* 82* 61*  39* 25* 24*  CREATININE 6.00*  6.02* 4.39* 3.20* 2.14* 1.60* 1.82*  CALCIUM 6.9*  6.9* 7.0* 7.1* 7.3* 7.4* 6.9*  MG 2.2  --  2.1 1.8 1.4* 1.4*  PHOS 3.7  3.7 3.0 2.8 2.7 2.0* 3.8   GFR: Estimated Creatinine Clearance: 40.5 mL/min (A) (by C-G formula based on SCr of 1.82 mg/dL (H)). Recent Labs  Lab 04/04/2019 0507 04/04/19 0503 04/05/19 0601 04/06/19 0428  WBC 10.4 10.8* 11.5* 13.5*    Liver Function Tests: Recent Labs  Lab 04/02/19 0716 03/24/2019 0507 04/04/19 0503 04/05/19 0601 04/06/19 0428  ALBUMIN 2.8* 2.9* 3.1* 3.1* 2.9*   Recent Labs  Lab 04/06/19 1024  LIPASE 60*   No results for input(s): AMMONIA in the last 168 hours.  ABG No results found for: PHART, PCO2ART, PO2ART, HCO3, TCO2, ACIDBASEDEF, O2SAT   Coagulation Profile: Recent Labs  Lab 04/02/19 0716 03/22/2019 0507 04/04/19 0503 04/05/19 0601 04/06/19 0428  INR 5.7* 1.4* 1.4* 1.4* 1.8*  Cardiac Enzymes: Recent Labs  Lab 04/01/19 0534 04/17/2019 0507  CKTOTAL 289 188    HbA1C: Hemoglobin A1C  Date/Time Value Ref Range Status  04/22/2017 02:11 PM 6.1  Final   Hgb A1c MFr Bld  Date/Time Value Ref Range Status  03/29/2019 07:03 AM 6.9 (H) 4.8 - 5.6 % Final    Comment:    (NOTE) Pre diabetes:          5.7%-6.4% Diabetes:              >6.4% Glycemic control for   <7.0% adults with diabetes   10/16/2016 10:42 AM 6.6 (H) 4.6 - 6.5 % Final    Comment:    Glycemic Control Guidelines for People with Diabetes:Non Diabetic:  <6%Goal of Therapy: <7%Additional Action Suggested:  >8%     CBG: Recent Labs  Lab 04/05/19 0730 04/06/19 1044  GLUCAP 122* 228*    Review of Systems:   Negative except Lower L abdominal pain  Past Medical History  He,  has a past medical history of Arthritis, Bifascicular block, Blood transfusion without reported diagnosis, BPH with obstruction/lower urinary tract symptoms, Carotid stenosis, Chronic gout, Chronic renal insufficiency, stage III (moderate), COPD  (chronic obstructive pulmonary disease) ([place]), Coronary artery disease, Diabetes mellitus without complication ([place]) (83/0940), 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   Surgical History    Past Surgical History:  Procedure Laterality Date  . AORTIC VALVE REPLACEMENT  1995   [facility] mechanical valve  . CARDIAC CATHETERIZATION  1995  . CARDIAC EVENT MONITOR  07/2017   PVCs, sinus brady---no significant abnormal findings.  . CORONARY ARTERY BYPASS GRAFT  1995   RCA andLAD  . ELECTROPHYSIOLOGY STUDY N/A 09/11/2012   Procedure: ELECTROPHYSIOLOGY STUDY;  Surgeon: Deboraha Sprang, MD;  Location: [facility] CATH LAB;  Service: Cardiovascular;  Laterality: N/A;  . 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 parathyroidectomy    . TEE WITHOUT CARDIOVERSION N/A 04/06/2019   Procedure: TRANSESOPHAGEAL ECHOCARDIOGRAM (TEE);  Surgeon: Donato Heinz, MD;  Location: [facility] ENDOSCOPY;  Service: Endoscopy;  Laterality: N/A;  . TRANSTHORACIC ECHOCARDIOGRAM  08/15/12; 08/03/16; 11/04/17   2014: Mild LVH and LV dilation, EF 50-55%, normal aortic valve gradients.  2018: EF 45-50%, normal functioning prosthetic AV, diffuse hypokinesis, grd I DD, mild mitral stenosis.  10/2017: EF 40-45%, global hypokinesis, grd II DD, mild MR, severe LAE, mild RV dysfxn, mild inc pulm press.     Social History   reports that he quit smoking about 4 years ago. His smoking use included cigars. He has never used smokeless tobacco. He reports current alcohol use of about 14.0 standard drinks of alcohol per week. He reports that he  does not use drugs.   Family History   His family history includes Alcohol abuse in his brother; Arthritis in his mother; Diabetes in his daughter and father; Heart disease in his brother, father, and mother; Heart failure in his father and mother; Hyperlipidemia in his father and mother; Hypertension in his father and mother; Liver disease in his brother.   Allergies Allergies  Allergen Reactions  . Vancomycin     Acute renal failure and h/o renal transplant. DO NOT GIVE!     Home Medications  Prior to Admission medications  Medication Sig Start Date End Date Taking? Authorizing Provider  allopurinol (ZYLOPRIM) 100 MG tablet Take 100-200 mg by mouth 2 (two) times daily. 1 tablet by mouth in the morning 2 tablets by mouth at night   Yes [provider]  aspirin EC 81 MG tablet Take 1 tablet (81 mg total) by mouth daily. 11/03/18  Yes Deboraha Sprang, MD  atorvastatin (LIPITOR) 80 MG tablet Take 1 tablet (80 mg total) by mouth daily. 04/17/16  Yes McGowen, [name], MD  calcitRIOL (ROCALTROL) 0.5 MCG capsule Take 0.5 mcg by mouth daily.    Yes [provider]  furosemide (LASIX) 40 MG tablet Take 40 mg by mouth daily.   Yes [provider]  lisinopril (PRINIVIL,ZESTRIL) 5 MG tablet Take 5 mg by mouth daily.   Yes [provider]  Multiple Vitamins-Minerals (OCUVITE PO) Take 1 tablet by mouth 2 (two) times a day.    Yes [provider]  mycophenolate (CELLCEPT) 500 MG tablet Take 1,000 mg by mouth 2 (two) times daily.    Yes [provider]  Naproxen Sodium (ALEVE PO) Take 1 tablet by mouth daily as needed (pain).   Yes [provider]  tamsulosin (FLOMAX) 0.4 MG CAPS capsule Take 0.4 mg by mouth daily.  03/21/16  Yes [provider]  warfarin (COUMADIN) 2 MG tablet Take 7 mg by mouth See admin instructions. Tuesday & Thursday patient takes a  total dose of 7 mg. Patient takes with 5 mg to equal total dose of 7 mg.     [provider]  warfarin (COUMADIN) 5 MG tablet Take 5 mg by mouth See admin instructions. Monday,Wednesday,Friday,Saturday & Sunday patient takes 5 mg.  On Tuesday and Thursday patient takes 7 mg. 11/02/17   [provider]     Critical care time: 75 min

## 2019-04-07 DIAGNOSIS — R578 Other shock: Secondary | ICD-10-CM

## 2019-04-07 DIAGNOSIS — Z9889 Other specified postprocedural states: Secondary | ICD-10-CM

## 2019-04-07 DIAGNOSIS — Z95828 Presence of other vascular implants and grafts: Secondary | ICD-10-CM

## 2019-04-07 LAB — PREPARE FRESH FROZEN PLASMA
Unit division: 0
Unit division: 0
Unit division: 0
Unit division: 0
Unit division: 0
Unit division: 0
Unit division: 0
Unit division: 0
Unit division: 0
Unit division: 0
Unit division: 0

## 2019-04-07 LAB — BPAM FFP
Blood Product Expiration Date: 202010242359
Blood Product Expiration Date: 202010242359
Blood Product Expiration Date: 202010242359
Blood Product Expiration Date: 202010242359
Blood Product Expiration Date: 202010242359
Blood Product Expiration Date: 202010242359
Blood Product Expiration Date: 202010242359
Blood Product Expiration Date: 202010242359
Blood Product Expiration Date: 202011012359
Blood Product Expiration Date: 202011032359
Blood Product Expiration Date: 202011042359
Blood Product Expiration Date: 202011042359
ISSUE DATE / TIME: 202010191306
ISSUE DATE / TIME: 202010191306
ISSUE DATE / TIME: 202010191602
ISSUE DATE / TIME: 202010191602
ISSUE DATE / TIME: 202010191648
ISSUE DATE / TIME: 202010191648
ISSUE DATE / TIME: 202010191648
ISSUE DATE / TIME: 202010191648
ISSUE DATE / TIME: 202010191713
ISSUE DATE / TIME: 202010191713
ISSUE DATE / TIME: 202010191713
ISSUE DATE / TIME: 202010191713
Unit Type and Rh: 600
Unit Type and Rh: 600
Unit Type and Rh: 600
Unit Type and Rh: 6200
Unit Type and Rh: 6200
Unit Type and Rh: 6200
Unit Type and Rh: 6200
Unit Type and Rh: 6200
Unit Type and Rh: 6200
Unit Type and Rh: 6200
Unit Type and Rh: 6200
Unit Type and Rh: 6200

## 2019-04-07 LAB — HEMOGLOBIN AND HEMATOCRIT, BLOOD
HCT: 29 % — ABNORMAL LOW (ref 39.0–52.0)
HCT: 25.8 % — ABNORMAL LOW (ref 39.0–52.0)
HCT: 24.4 % — ABNORMAL LOW (ref 39.0–52.0)
Hemoglobin: 10.2 g/dL — ABNORMAL LOW (ref 13.0–17.0)
Hemoglobin: 8.7 g/dL — ABNORMAL LOW (ref 13.0–17.0)
Hemoglobin: 8.2 g/dL — ABNORMAL LOW (ref 13.0–17.0)

## 2019-04-07 LAB — CBC
HCT: 28.5 % — ABNORMAL LOW (ref 39.0–52.0)
Hemoglobin: 9.8 g/dL — ABNORMAL LOW (ref 13.0–17.0)
MCH: 29.5 pg (ref 26.0–34.0)
MCHC: 34.4 g/dL (ref 30.0–36.0)
MCV: 85.8 fL (ref 80.0–100.0)
Platelets: 133 10*3/uL — ABNORMAL LOW (ref 150–400)
RBC: 3.32 MIL/uL — ABNORMAL LOW (ref 4.22–5.81)
RDW: 15.8 % — ABNORMAL HIGH (ref 11.5–15.5)
WBC: 23.7 10*3/uL — ABNORMAL HIGH (ref 4.0–10.5)
nRBC: 0.2 % (ref 0.0–0.2)

## 2019-04-07 LAB — PROTIME-INR
INR: 1.2 (ref 0.8–1.2)
INR: 1.1 (ref 0.8–1.2)
Prothrombin Time: 15 seconds (ref 11.4–15.2)
Prothrombin Time: 14.4 seconds (ref 11.4–15.2)

## 2019-04-07 LAB — RENAL FUNCTION PANEL
Albumin: 2.7 g/dL — ABNORMAL LOW (ref 3.5–5.0)
Anion gap: 10 (ref 5–15)
BUN: 33 mg/dL — ABNORMAL HIGH (ref 8–23)
CO2: 23 mmol/L (ref 22–32)
Calcium: 6.8 mg/dL — ABNORMAL LOW (ref 8.9–10.3)
Chloride: 105 mmol/L (ref 98–111)
Creatinine, Ser: 2.31 mg/dL — ABNORMAL HIGH (ref 0.61–1.24)
GFR calc Af Amer: 31 mL/min — ABNORMAL LOW (ref >60)
GFR calc non Af Amer: 27 mL/min — ABNORMAL LOW (ref >60)
Glucose, Bld: 171 mg/dL — ABNORMAL HIGH (ref 70–99)
Phosphorus: 4.9 mg/dL — ABNORMAL HIGH (ref 2.5–4.6)
Potassium: 5 mmol/L (ref 3.5–5.1)
Sodium: 138 mmol/L (ref 135–145)

## 2019-04-07 LAB — BLOOD PRODUCT ORDER (VERBAL) VERIFICATION

## 2019-04-07 MED ORDER — SODIUM CHLORIDE 0.9 % IV SOLN
400.0000 mg | Freq: Three times a day (TID) | INTRAVENOUS | Status: DC
  Administered 2019-04-08 – 2019-04-14 (×18): 400 mg via INTRAVENOUS
  Filled 2019-04-07 (×29): qty 400

## 2019-04-07 MED ORDER — FENTANYL CITRATE (PF) 100 MCG/2ML IJ SOLN
25.0000 ug | INTRAMUSCULAR | Status: DC | PRN
  Administered 2019-04-07 – 2019-04-08 (×8): 50 ug via INTRAVENOUS
  Filled 2019-04-07 (×8): qty 2

## 2019-04-07 MED ORDER — FUROSEMIDE 10 MG/ML IJ SOLN
40.0000 mg | Freq: Once | INTRAMUSCULAR | Status: AC
  Administered 2019-04-07: 40 mg via INTRAVENOUS
  Filled 2019-04-07: qty 4

## 2019-04-07 MED ORDER — SODIUM CHLORIDE 0.9% FLUSH
10.0000 mL | Freq: Two times a day (BID) | INTRAVENOUS | Status: DC
  Administered 2019-04-07 – 2019-04-10 (×8): 10 mL

## 2019-04-07 MED ORDER — SODIUM CHLORIDE 0.9 % IV SOLN
400.0000 mg | Freq: Three times a day (TID) | INTRAVENOUS | Status: DC
  Administered 2019-04-07: 400 mg via INTRAVENOUS
  Filled 2019-04-07 (×3): qty 400

## 2019-04-07 NOTE — Progress Notes (Signed)
PHARMACY CONSULT NOTE FOR:  OUTPATIENT  PARENTERAL ANTIBIOTIC THERAPY (OPAT)  Indication: Culture negative endocarditis Regimen: Ceftaroline 400 mg q 8 hours  End date: 05/15/2019  IV antibiotic discharge orders are pended. To discharging provider:  please sign these orders via discharge navigator,  Select New Orders & click on the button choice - Manage This Unsigned Work.     Thank you for allowing pharmacy to be a part of this patient's care.  Jimmy Footman, PharmD, BCPS, BCIDP Infectious Diseases Clinical Pharmacist Phone: [phone] 04/07/2019, 10:00 AM

## 2019-04-07 NOTE — Progress Notes (Signed)
[name] for Infectious Disease  Date of Admission:  04/07/2019     Total days of antibiotics 12         ASSESSMENT:  John Barton hospitalization has now been complicated by the development of retroperitoneal hematoma s/p multiple units of PRBC and platlets and moved to the ICU. From an ID standpoint he remains stable. Will continue with previous plan for 6 weeks of IV therapy, however due to cost of medication will change from Daptomycin and Ceftriaxone to Ceftaroline. PICC line in place. Will plan for follow up in ID office.    PLAN:  1. Discontinue ceftriaxone and daptomycin 2. Start Ceftaroline with end date of 05/15/19. 3. Continue retroperitoneal hematoma care per primary team 4. Follow up with ID in office   ID will sign off and be available as needed.  Diagnosis: Fever / Endocarditis  Culture Result: Culture negative  Allergies  Allergen Reactions  . Vancomycin     Acute renal failure and h/o renal transplant. DO NOT GIVE!    OPAT Orders Discharge antibiotics: Ceftaroline Per pharmacy protocol  Aim for Vancomycin trough 15-20 (unless otherwise indicated) Duration: 6 weeks  End Date: 05/15/19 [facility] Care Per Protocol:  Labs weekly while on IV antibiotics: _X_ CBC with differential _X_ BMP __ CMP __ CRP __ ESR __ Vancomycin trough __ CK  _X_ Please pull PIC at completion of IV antibiotics __ Please leave PIC in place until doctor has seen patient or been notified  Fax weekly labs to [phone]  Clinic Follow Up Appt:  3pm at 11/23 with Terri Piedra, NP   Principal Problem:   Endocarditis Active Problems:   H/O prosthetic aortic valve replacement   Hx of CABG   Renal transplant recipient   Sepsis secondary to UTI [facility])   Community acquired pneumonia   AKI (acute kidney injury) ([place])   Fever   Abdominal discomfort   . aspirin EC  81 mg Oral Daily  . atorvastatin  80 mg Oral Daily  . calcitRIOL  0.5 mcg Oral Once per day on  Tue Thu Sat   And  . calcitRIOL  1 mcg Oral Once per day on Sun Mon Wed Fri  . Chlorhexidine Gluconate Cloth  6 each Topical Daily  . influenza vaccine adjuvanted  0.5 mL Intramuscular Tomorrow-1000  . mouth rinse  15 mL Mouth Rinse BID  . mycophenolate  1,000 mg Oral BID  . pantoprazole (PROTONIX) IV  40 mg Intravenous Q24H  . pneumococcal 23 valent vaccine  0.5 mL Intramuscular Tomorrow-1000  . potassium chloride  40 mEq Oral Daily  . sodium bicarbonate  1,300 mg Oral BID  . sodium chloride flush  10-40 mL Intracatheter Q12H  . tamsulosin  0.4 mg Oral Daily    SUBJECTIVE:  Afebrile overnight. Increased leukocytosis with WBC count of 23. Now in the ICU related to retroperitoneal hematoma s/p multiple transfusions. Sleepy and drowsy.   Allergies  Allergen Reactions  . Vancomycin     Acute renal failure and h/o renal transplant. DO NOT GIVE!     Review of Systems: Review of Systems  Constitutional: Negative for chills, fever and weight loss.  Respiratory: Negative for cough, shortness of breath and wheezing.   Cardiovascular: Negative for chest pain and leg swelling.  Gastrointestinal: Negative for abdominal pain, constipation, diarrhea, nausea and vomiting.  Skin: Negative for rash.    OBJECTIVE: Vitals:   04/07/19 0430 04/07/19 0442 04/07/19 0500 04/07/19 0723  BP:   115/70  Pulse:   81   Resp: 20  (!) 25   Temp:    98.4 F (36.9 C)  TempSrc:    Oral  SpO2:   100%   Weight:  101.2 kg    Height:       Body mass index is 31.56 kg/m.  Physical Exam Constitutional:      General: He is not in acute distress.    Appearance: He is well-developed.     Comments: Lying in bed with head of bed elevated; sleeping on entry but easily arousable.   Cardiovascular:     Rate and Rhythm: Normal rate and regular rhythm.     Heart sounds: Normal heart sounds.     Comments: Arterial line with dressing that is clean and dry. No evidence of infection. Pulmonary:     Effort:  Pulmonary effort is normal.     Breath sounds: Normal breath sounds.  Skin:    General: Skin is warm and dry.  Neurological:     Mental Status: He is alert and oriented to person, place, and time.  Psychiatric:        Behavior: Behavior normal.        Thought Content: Thought content normal.        Judgment: Judgment normal.     Lab Results Lab Results  Component Value Date   WBC 23.7 (H) 04/07/2019   HGB 9.8 (L) 04/07/2019   HCT 28.5 (L) 04/07/2019   MCV 85.8 04/07/2019   PLT 133 (L) 04/07/2019    Lab Results  Component Value Date   CREATININE 2.31 (H) 04/07/2019   BUN 33 (H) 04/07/2019   NA 138 04/07/2019   K 5.0 04/07/2019   CL 105 04/07/2019   CO2 23 04/07/2019    Lab Results  Component Value Date   ALT 22 04/06/2019   AST 25 04/06/2019   ALKPHOS 54 04/06/2019   BILITOT 0.4 04/06/2019     Microbiology: Recent Results (from the past 240 hour(s))  C difficile quick scan w PCR reflex     Status: None   Collection Time: 03/29/19  2:47 AM   Specimen: STOOL  Result Value Ref Range Status   C Diff antigen NEGATIVE NEGATIVE Final   C Diff toxin NEGATIVE NEGATIVE Final   C Diff interpretation No C. difficile detected.  Final    Comment: Performed at [facility], [place] [address] [place], [place] [address]  GI pathogen panel by PCR, stool     Status: None   Collection Time: 03/29/19  8:17 AM   Specimen: Stool  Result Value Ref Range Status   Plesiomonas shigelloides NOT PERFORMED  Final    Comment: Test not performed   Yersinia enterocolitica NOT PERFORMED  Final    Comment: Test not performed   Vibrio NOT PERFORMED  Final    Comment: Test not performed   Enteropathogenic E coli NOT PERFORMED  Final    Comment: Test not performed   E coli (ETEC) LT/ST NOT PERFORMED  Final    Comment: Test not performed   E coli 0157 by PCR NOT PERFORMED  Final    Comment: Test not performed   Cryptosporidium by PCR NOT PERFORMED  Final    Comment: Test not performed    Entamoeba histolytica NOT PERFORMED  Final    Comment: Test not performed   Adenovirus F 40/41 NOT PERFORMED  Final    Comment: Test not performed   Norovirus GI/GII NOT PERFORMED  Final    Comment: Test not performed   Sapovirus NOT PERFORMED  Final    Comment: (NOTE) Test not performed Performed At: [facility] [place], [place] [id number] Rush Farmer MD DY:[id number]    Vibrio cholerae NOT PERFORMED  Final    Comment: Test not performed   Campylobacter by PCR [id number]  Final    Comment: (NOTE) The specimen submitted does not meet the laboratory's criteria for acceptability. Refer to [employer] of Services for specimen acceptability criteria.      Required: ParaPak Orange      Received: Sterile Container      Mayra Neer was notified 03/30/2019.    Salmonella by PCR NOT PERFORMED  Final    Comment: Test not performed   E coli (STEC) NOT PERFORMED  Final    Comment: Test not performed   Enteroaggregative E coli NOT PERFORMED  Final    Comment: Test not performed   Shigella by PCR NOT PERFORMED  Final    Comment: Test not performed   Cyclospora cayetanensis NOT PERFORMED  Final    Comment: Test not performed   Astrovirus NOT PERFORMED  Final    Comment: Test not performed   G lamblia by PCR NOT PERFORMED  Final    Comment: Test not performed   Rotavirus A by PCR NOT PERFORMED  Final    Comment: Test not performed CALLED TO L.KING,RN AT 1732 03/30/19 BY L.PITT  Performed at [facility], [place] [address] [place], [place] [address]      Terri Piedra, [place] [facility] [phone] Pager  04/07/2019  9:54 AM

## 2019-04-07 NOTE — Progress Notes (Signed)
[place] Progress Note Patient Name: John Barton DOB: 12-20-1945 MRN: [id number]   Date of Service  04/07/2019  HPI/Events of Note  Oliguria - bladder scan with residual = 109 mL. LVEF = 45-50% with moderate MR. Creatinine = 1.82 (improved from 3.2)  eICU Interventions  Will order: 1. Lasix 40 mg IV now.      Intervention Category Intermediate Interventions: Oliguria - evaluation and management  Sommer,Steven Eugene 04/07/2019, 3:02 AM

## 2019-04-07 NOTE — Progress Notes (Signed)
Called by RN. Patient unable to void. Bladder scan confirmed 300+ cc's in the bladder.  I&O cathed >> 400 cc output. Consider insertion of foley cath if patient remains unable to void.  He has a solitary kidney , and creatinine bumped 10/20  Magdalen Spatz, MSN, AGACNP-BC [place] Pager # [phone] After 4 pm please call [phone] 04/07/2019 2:53 PM

## 2019-04-07 NOTE — Progress Notes (Signed)
NAME:  John Barton, MRN:  [id number], DOB:  1946-04-16, LOS: 13 ADMISSION DATE:  04/10/2019, CONSULTATION DATE: 04/06/19 REFERRING MD:  Dr. 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 ( Solitary Kidney)  He became abdominal L sided pain and hypotension  10/19, with drop in HGB, ( to 6.7) and intermittent bradycardia. CT was + for large retroperitoneal hematoma. 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    Admitted on 10/10, febrile, getting treated for presumed endocarditis.   Cultures negative, TEE eventually was able to be done and was negative for vegetations.    On coumadin at home, was supratheraputic, coumadin reversed, now was getting bridged back to ac with coumadin, had been on heparin infusion.   This am became persistently hypotensive with drop in H/H.  Intermittent bradycardia.  CT abd done, large RP hematoma, heterogenous.     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 No  vegetation seen  2. Left ventricular ejection fraction, by visual estimation, is 45 to 50%. The left ventricle has mildly decreased function. There is mildly increased left ventricular hypertrophy.  3. Global right ventricle has normal systolic function.The right ventricular size is normal.  4. Severe mitral annular calcification.  5. The mitral valve is degenerative. Mild to moderate mitral valve regurgitation.  6. The tricuspid valve is normal in structure. Tricuspid valve regurgitation is mild.  7. Mechanical prosthesis in the aortic valve position. No paravalvular leak  8. The pulmonic valve was not well visualized. Pulmonic valve regurgitation is not visualized by color flow Doppler.  Micro Data:  10/9 Bl cx neg  Antimicrobials:   Ceftriaxone 10/14 - current Daptomycin 10/13 --> current  Cefepime 10/9-10/13  Interim history/subjective:  Oliguria overnight>> given Lasix 40 per E Link MD>> + 5.8 L Creatinine up  trending from 1.82 to 2.30 overnight   HGB drop from 11 to 9.8 overnight Poor intake States his abdomen still hurts but is better than it was yesterday from pain perspective Per nursing it is less tight and distended than yesterday  Objective   Blood pressure 115/70, pulse 81, temperature 98.4 F (36.9 C), temperature source Oral, resp. rate (!) 25, height 5' 10.5" (1.791 m), weight 101.2 kg, SpO2 100 %.        Intake/Output Summary (Last 24 hours) at 04/07/2019 0954 Last data filed at 04/07/2019 0500 Gross per 24 hour  Intake 2059.36 ml  Output 100 ml  Net 1959.36 ml  Filed Weights   04/05/19 0509 04/06/19 0353 04/07/19 0442  Weight: 92.2 kg 92.9 kg 101.2 kg    Examination: General: awake and alert, supine in bed,appears uncomfortable, but states better than yesterday HENT: NCAT, NO LAD Lungs: Bilateral chest excursion, clear, diminished per bases Cardiovascular: S1, S2, RRR no mgr Abdomen: Tender on L side, Distended.  + bowel sounds.  Graft not tender  Extremities: no edema, no erythema, no obvious deformities Neuro: A and O x 3, MAE x 4, appriapiate  Resolved Hospital Problem list    Assessment & Plan:  Hypotension: 2/2  Large. RP hemorrhage due to anticoagulation:>> Hypotension Resolved  PT/INR is WNL Off pressors at present  HGB Stabilized after 4 u prbc and 2 u ffp given rapidly. Reversed with protamine, also given Kcentra and vitamin K.  Plan MAP goal is > 65 mm Hg No anticoagulation   Transfuse  for goal hb >8.   Trend  INR PT.   Discussed with surgery and IR.  Will Monitor closely for now.   Will likely tamponade.  Reviewed with IR, no extension into peritoneal space, all RP.  Does not think likely that would find source of bleed with IR study.  Will call back if further decompensates.    Leukocytosis and fever:  Cellcept for renal transplant>>  immunocompromised  Fluid collection heterogenous, loculated, pelvis with smaller fluid collection.  Possible old hemorrhage vs abscess?  Bl cx negative.  Afebrile last 24 hours  Plan Trend CBC and Fever Curve Cont abtx per ID. Re-culture as is clinically indicated  CXR prn     AKI, s/p kidney transplant.  Solitary Kidney Creatinine up trending from 1.82 to 2.31 Poor UO Poor oral  Intake Urine is very concentrated Plan Bladder scan  Now and prn Trend BMET and monitor UO.   Monitor closely.  Consider adding maintenance IVF if PO does not improve Consult renal if does not reverse within next 24 hours   Type 2 diabetes mellitus - A1c 6.6 in 2018 - glucose ok Plan  CBG's Q 4  SSI  Mechanical aortic valve Supratherapeutic INR - On Coumadin, INR 2.4 on presentation. Pharmacy to dose Coumadin to maintain INR between 2.5-3. -HOLD anticoagulation for now.   CAD s/p CABG 1995 Elevated troponin - Troponin was slightly elevated at 21 on admission. Currently not complaining of chest pain.  Plan - Continue lipitor      - Hold ASa for now       - 12 Lead EKG in am and prn  Combined systolic and diastolic CHF  History of hypertension - Echo 10/10, EF 45 to 50%, unchanged. Plan - Hold Home meds include Lasix 40 mg daily, lisinopril 5 mg daily. lisinopril hold because of hypotension and AKI. - May need lasix, currently good respiratory status on  2L  Sinus bradycardia  - s/p loop recorder Plan      - Continue Tele       - Monitor for changes       - !2 Lead in am and prn  BPH  Plan  - continue Flomax.  Concerned about poor UO and increasing creatinine in patient with solitary transplanted kidney.  Will consider renal US and low threshold to consult renal   Best practice:  Diet: Heart Healthy Pain/Anxiety/Delirium protocol (if indicated):Fentanyl 25-50 prn per E Link MD VAP protocol (if indicated: NA DVT prophylaxis: SCDs GI prophylaxis: None Glucose control: CBG Q 4, Mobility: BR for now with goal of OOB once  BP stable Code Status: Full Family Communication: Family updated by nursing Disposition: ICU  Labs   CBC: Recent Labs  Lab 04/01/19 0534 04/02/19 0716 03/31/2019 0507 04/04/19 0503 04/05/19 0601 04/06/19 0428  04/06/19 1431 04/06/19 1500 04/06/19 1710 04/06/19 2316 04/07/19 0408  WBC 11.7* 10.2 10.4 10.8* 11.5* 13.5*  --   --   --  16.6*  --  23.7*  NEUTROABS 10.8* 9.5* 8.5* 8.3*  --   --   --   --   --  14.6*  --   --   HGB 10.3* 10.6* 10.7* 11.0* 10.5* 8.5*   < > 7.5* 7.9* 11.0* 10.2* 9.8*  HCT 33.2* 32.9* 32.3* 32.9* 32.3* 27.4*   < > 22.0* 25.4* 32.4* 29.0* 28.5*  MCV 90.7 88.9 87.8 87.7 88.5 90.4  --   --   --  87.3  --  85.8  PLT 150 172 186 193 240 239  --   --   --  127*  --  133*   < > = values in this interval not displayed.    Basic Metabolic Panel: Recent Labs  Lab 04/01/19 0534  03/22/2019 0507 04/04/19 0503 04/05/19 0601 04/06/19 0428 04/06/19 1431 04/06/19 1710 04/07/19 0408  NA 137  136   < > 140 141 141 141 141 138 138  K 4.0  4.0   < > 3.4*  3.2* 3.3* 3.5 4.5 4.9 5.0  CL 109  108   < > 111 107 105 106  --  106 105  CO2 14*  14*   < > 21* 23 25 25   --  21* 23  GLUCOSE 119*  120*   < > 130* 126* 124* 173* 194* 210* 171*  BUN 86*  86*   < > 61* 39* 25* 24*  --  26* 33*  CREATININE 6.00*  6.02*   < > 3.20* 2.14* 1.60* 1.82*  --  1.82* 2.31*  CALCIUM 6.9*  6.9*   < > 7.1* 7.3* 7.4* 6.9*  --  6.2* 6.8*  MG 2.2  --  2.1 1.8 1.4* 1.4*  --   --   --   PHOS 3.7  3.7   < > 2.8 2.7 2.0* 3.8  --   --  4.9*   < > = values in this interval not displayed.   GFR: Estimated Creatinine Clearance: 33.2 mL/min (A) (by C-G formula based on SCr of 2.31 mg/dL (H)). Recent Labs  Lab 04/05/19 0601 04/06/19 0428 04/06/19 1710 04/07/19 0408  WBC 11.5* 13.5* 16.6* 23.7*    Liver Function Tests: Recent Labs  Lab 04/04/19 0503 04/05/19 0601 04/06/19 0428 04/06/19 1159 04/07/19 0408  AST  --   --   --  25  --   ALT  --   --   --  22  --   ALKPHOS  --   --   --  54  --   BILITOT  --   --   --  0.4  --   PROT  --   --   --  4.8*  --   ALBUMIN 3.1* 3.1* 2.9* 2.6* 2.7*   Recent Labs  Lab 04/06/19 1024  LIPASE 60*   No results for input(s): AMMONIA in the last 168 hours.  ABG No results found for: PHART, PCO2ART, PO2ART, HCO3, TCO2, ACIDBASEDEF, O2SAT   Coagulation Profile: Recent Labs  Lab 04/05/19 0601 04/06/19 0428 04/06/19 1905 04/06/19 2316 04/07/19 0600  INR 1.4* 1.8* 1.1 1.2 1.1  Cardiac Enzymes: Recent Labs  Lab 04/01/19 0534 04/18/2019 0507  CKTOTAL 289 188    HbA1C: Hemoglobin A1C  Date/Time Value Ref Range Status  04/22/2017 02:11 PM 6.1  Final   Hgb A1c MFr Bld  Date/Time Value Ref Range Status  03/29/2019 07:03 AM 6.9 (H) 4.8 - 5.6 % Final    Comment:    (NOTE) Pre diabetes:          5.7%-6.4% Diabetes:              >6.4% Glycemic control for   <7.0% adults with diabetes   10/16/2016 10:42 AM 6.6 (H) 4.6 - 6.5 % Final    Comment:    Glycemic Control Guidelines for People with Diabetes:Non  Diabetic:  <6%Goal of Therapy: <7%Additional Action Suggested:  >8%     CBG: Recent Labs  Lab 04/05/19 0730 04/06/19 1044  GLUCAP 122* 228*     Surgical History    Past Surgical History:  Procedure Laterality Date  . AORTIC VALVE REPLACEMENT  1995   [facility] mechanical valve  . CARDIAC CATHETERIZATION  1995  . CARDIAC EVENT MONITOR  07/2017   PVCs, sinus brady---no significant abnormal findings.  . CORONARY ARTERY BYPASS GRAFT  1995   RCA andLAD  . ELECTROPHYSIOLOGY STUDY N/A 09/11/2012   Procedure: ELECTROPHYSIOLOGY STUDY;  Surgeon: Deboraha Sprang, MD;  Location: [facility] CATH LAB;  Service: Cardiovascular;  Laterality: N/A;  . 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 parathyroidectomy    . TEE WITHOUT CARDIOVERSION N/A 03/29/2019   Procedure: TRANSESOPHAGEAL ECHOCARDIOGRAM (TEE);  Surgeon: Donato Heinz, MD;  Location[facility] ENDOSCOPY;  Service: Endoscopy;  Laterality: N/A;  . TRANSTHORACIC ECHOCARDIOGRAM  08/15/12; 08/03/16; 11/04/17   2014: Mild LVH and LV dilation, EF 50-55%, normal aortic valve gradients.  2018: EF 45-50%, normal functioning prosthetic AV, diffuse hypokinesis, grd I DD, mild mitral stenosis.  10/2017: EF 40-45%, global hypokinesis, grd II DD, mild MR, severe LAE, mild RV dysfxn, mild inc pulm press.    Allergies Allergies  Allergen Reactions  . Vancomycin     Acute renal failure and h/o renal transplant. DO NOT GIVE!     Home Medications  Prior to Admission medications   Medication Sig Start Date End Date Taking? Authorizing Provider  allopurinol (ZYLOPRIM) 100 MG tablet Take 100-200 mg by mouth 2 (two) times daily. 1 tablet by mouth in the morning 2 tablets by mouth at night   Yes [provider]  aspirin EC 81 MG tablet Take 1 tablet (81 mg total) by mouth daily. 11/03/18  Yes Deboraha Sprang, MD  atorvastatin (LIPITOR) 80 MG tablet Take 1 tablet  (80 mg total) by mouth daily. 04/17/16  Yes McGowen, [name], MD  calcitRIOL (ROCALTROL) 0.5 MCG capsule Take 0.5 mcg by mouth daily.    Yes [provider]  furosemide (LASIX) 40 MG tablet Take 40 mg by mouth daily.   Yes [provider]  lisinopril (PRINIVIL,ZESTRIL) 5 MG tablet Take 5 mg by mouth daily.   Yes [provider]  Multiple Vitamins-Minerals (OCUVITE PO) Take 1 tablet by mouth 2 (two) times a day.    Yes [provider]  mycophenolate (CELLCEPT) 500 MG tablet Take 1,000 mg by mouth 2 (two) times daily.    Yes [provider]  Naproxen Sodium (ALEVE PO) Take 1 tablet by mouth daily as needed (pain).   Yes [provider]  tamsulosin (FLOMAX) 0.4 MG CAPS capsule Take 0.4 mg by mouth daily.  03/21/16  Yes [provider]  warfarin (COUMADIN) 2 MG tablet Take 7 mg by mouth See admin instructions. Tuesday & Thursday patient takes a  total dose of 7 mg. Patient takes with 5 mg to equal total dose of 7 mg.    [provider]  warfarin (COUMADIN) 5 MG tablet Take 5 mg by mouth See admin instructions. Monday,Wednesday,Friday,Saturday & Sunday patient takes 5 mg.  On Tuesday and Thursday patient takes 7 mg. 11/02/17   [provider]     Critical care time: 40 minutes    Magdalen Spatz, MSN, AGACNP-BC [place] Pager # [phone] After 4 pm please call [phone] 04/07/2019 9:57 AM

## 2019-04-07 NOTE — Progress Notes (Signed)
[place] Progress Note Patient Name: John Barton DOB: 06-25-1945 MRN: [id number]   Date of Service  04/07/2019  HPI/Events of Note  Flank pain - Related to retroperitoneal hematoma. No improvement with percocet.  eICU Interventions  Will order: 1. Fentany 25-50 mcg IV Q 2 hours PRN pain.      Intervention Category Major Interventions: Other:  John Barton Cornelia Copa 04/07/2019, 1:10 AM

## 2019-04-08 DIAGNOSIS — D62 Acute posthemorrhagic anemia: Secondary | ICD-10-CM

## 2019-04-08 LAB — PHOSPHORUS: Phosphorus: 4 mg/dL (ref 2.5–4.6)

## 2019-04-08 LAB — COMPREHENSIVE METABOLIC PANEL
ALT: 20 U/L (ref 0–44)
AST: 30 U/L (ref 15–41)
Albumin: 2.7 g/dL — ABNORMAL LOW (ref 3.5–5.0)
Alkaline Phosphatase: 70 U/L (ref 38–126)
Anion gap: 9 (ref 5–15)
BUN: 34 mg/dL — ABNORMAL HIGH (ref 8–23)
CO2: 23 mmol/L (ref 22–32)
Calcium: 6.8 mg/dL — ABNORMAL LOW (ref 8.9–10.3)
Chloride: 106 mmol/L (ref 98–111)
Creatinine, Ser: 1.96 mg/dL — ABNORMAL HIGH (ref 0.61–1.24)
GFR calc Af Amer: 38 mL/min — ABNORMAL LOW (ref >60)
GFR calc non Af Amer: 32 mL/min — ABNORMAL LOW (ref >60)
Glucose, Bld: 141 mg/dL — ABNORMAL HIGH (ref 70–99)
Potassium: 4.8 mmol/L (ref 3.5–5.1)
Sodium: 138 mmol/L (ref 135–145)
Total Bilirubin: 1 mg/dL (ref 0.3–1.2)
Total Protein: 5.2 g/dL — ABNORMAL LOW (ref 6.5–8.1)

## 2019-04-08 LAB — CBC
HCT: 24.5 % — ABNORMAL LOW (ref 39.0–52.0)
Hemoglobin: 7.8 g/dL — ABNORMAL LOW (ref 13.0–17.0)
MCH: 28.9 pg (ref 26.0–34.0)
MCHC: 31.8 g/dL (ref 30.0–36.0)
MCV: 90.7 fL (ref 80.0–100.0)
Platelets: 128 10*3/uL — ABNORMAL LOW (ref 150–400)
RBC: 2.7 MIL/uL — ABNORMAL LOW (ref 4.22–5.81)
RDW: 16.8 % — ABNORMAL HIGH (ref 11.5–15.5)
WBC: 21.2 10*3/uL — ABNORMAL HIGH (ref 4.0–10.5)
nRBC: 0.2 % (ref 0.0–0.2)

## 2019-04-08 LAB — PROTIME-INR
INR: 1.2 (ref 0.8–1.2)
Prothrombin Time: 14.6 seconds (ref 11.4–15.2)

## 2019-04-08 MED ORDER — FENTANYL CITRATE (PF) 100 MCG/2ML IJ SOLN
12.5000 ug | INTRAMUSCULAR | Status: DC | PRN
  Administered 2019-04-08 (×3): 25 ug via INTRAVENOUS
  Administered 2019-04-09: 12.5 ug via INTRAVENOUS
  Administered 2019-04-09: 25 ug via INTRAVENOUS
  Administered 2019-04-09 (×2): 12.5 ug via INTRAVENOUS
  Administered 2019-04-10 – 2019-04-11 (×2): 25 ug via INTRAVENOUS
  Filled 2019-04-08 (×10): qty 2

## 2019-04-08 NOTE — Plan of Care (Signed)
  Problem: Education: Goal: Knowledge of General Education information will improve Description Including pain rating scale, medication(s)/side effects and non-pharmacologic comfort measures Outcome: Progressing   Problem: Health Behavior/Discharge Planning: Goal: Ability to manage health-related needs will improve Outcome: Progressing   

## 2019-04-08 NOTE — Progress Notes (Signed)
[place] Progress Note Patient Name: John Barton DOB: [birth year] MRN: [id number]   Date of Service  04/08/2019  HPI/Events of Note  K+ 4.8, Hgb 7.8 gm %  eICU Interventions  No intervention.        [name] U [name] 04/08/2019, 7:00 AM

## 2019-04-08 NOTE — Progress Notes (Signed)
CRITICAL VALUE ALERT  Critical Value:  Hgb 7.8, platelet 128  Date & Time Notied:  04/08/19 4:43 AM  Provider Notified: E-Link  Orders Received/Actions taken: None

## 2019-04-08 NOTE — Evaluation (Signed)
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  Admitted on 10/10, febrile, getting treated for presumed endocarditis and now with retroperitoneal hematoma.  Clinical Impression  Pt demonstrates deficits in strength, power, gait, balance, functional mobility, and endurance. Pt requiring physical assistance for all functional mobility to reduce falls risk. Pt with noted intermittent buckling of knees during ambulation which is corrected for with support of RW and minA from PT (pt reports this is due to LE weakness). Pt will benefit from continued acute PT services to improve LE strength/power, increase tolerance for OOB activity, and restore independence in all mobility. PT anticipates pt will progress to a level of mobility appropriate for discharge home with home health PT and a RW, however pt is not safe for discharge home at this time and will need at least 1-2 more additional PT sessions to get to this point.    Follow Up Recommendations Home health PT    Equipment Recommendations  Rolling walker with 5" wheels    Recommendations for Other Services       Precautions / Restrictions Precautions Precautions: Fall Restrictions Weight Bearing Restrictions: No      Mobility  Bed Mobility Overal bed mobility: Needs Assistance Bed Mobility: Supine to Sit     Supine to sit: Mod assist;HOB elevated        Transfers Overall transfer level: Needs assistance Equipment used: Rolling walker (2 wheeled) Transfers: Sit to/from Stand Sit to Stand: Min guard            Ambulation/Gait Ambulation/Gait  assistance: Min Web designer (Feet): 15 Feet Assistive device: Rolling walker (2 wheeled) Gait Pattern/deviations: Step-to pattern;Decreased step length - right;Decreased step length - left;Shuffle Gait velocity: reduced Gait velocity interpretation: <1.31 ft/sec, indicative of household ambulator General Gait Details: pt with short step to gait, intermittent minor knee buckling which able to correct with minA  Stairs            Wheelchair Mobility    Modified Rankin (Stroke Patients Only)       Balance Overall balance assessment: Needs assistance Sitting-balance support: Bilateral upper extremity supported;Feet supported Sitting balance-Leahy Scale: Fair Sitting balance - Comments: minG for sitting with BUE support   Standing balance support: Bilateral upper extremity supported Standing balance-Leahy Scale: Fair Standing balance comment: minA for static standing balance                             Pertinent Vitals/Pain Pain Assessment: Faces Faces Pain Scale: Hurts even more Pain Location: R flank Pain Descriptors / Indicators: Aching Pain Intervention(s): Limited activity within patient's tolerance    Home Living Family/patient expects to be discharged to:: Private residence Living Arrangements: Alone Available Help at Discharge: Family;Available PRN/intermittently Type of Home: House Home Access: Stairs to enter Entrance Stairs-Rails: Right Entrance Stairs-Number of Steps: 3 Home Layout: One level Home Equipment: None      Prior Function Level of Independence: Independent         Comments: working Horticulturist, commercial        Extremity/Trunk Assessment  Upper Extremity Assessment Upper Extremity Assessment: Generalized weakness    Lower Extremity Assessment Lower Extremity Assessment: Generalized weakness    Cervical / Trunk Assessment Cervical / Trunk Assessment: Normal  Communication   Communication:  No difficulties  Cognition Arousal/Alertness: Awake/alert Behavior During Therapy: WFL for tasks assessed/performed Overall Cognitive Status: Within Functional Limits for tasks assessed                                        General Comments      Exercises     Assessment/Plan    PT Assessment Patient needs continued PT services  PT Problem List Decreased strength;Decreased activity tolerance;Decreased balance;Decreased mobility;Decreased knowledge of use of DME;Decreased safety awareness;Decreased knowledge of precautions;Cardiopulmonary status limiting activity       PT Treatment Interventions DME instruction;Gait training;Stair training;Functional mobility training;Therapeutic activities;Balance training;Therapeutic exercise;Neuromuscular re-education;Patient/family education    PT Goals (Current goals can be found in the Care Plan section)  Acute Rehab PT Goals Patient Stated Goal: To return to prior level of function PT Goal Formulation: With patient Time For Goal Achievement: 04/22/19 Potential to Achieve Goals: Good    Frequency Min 3X/week   Barriers to discharge        Co-evaluation               AM-PAC PT "6 Clicks" Mobility  Outcome Measure Help needed turning from your back to your side while in a flat bed without using bedrails?: A Lot Help needed moving from lying on your back to sitting on the side of a flat bed without using bedrails?: A Lot Help needed moving to and from a bed to a chair (including a wheelchair)?: A Little Help needed standing up from a chair using your arms (e.g., wheelchair or bedside chair)?: A Little Help needed to walk in hospital room?: A Little Help needed climbing 3-5 steps with a railing? : A Lot 6 Click Score: 15    End of Session Equipment Utilized During Treatment: Gait belt Activity Tolerance: Patient tolerated treatment well Patient left: in chair;with call bell/phone within reach Nurse  Communication: Mobility status PT Visit Diagnosis: Muscle weakness (generalized) (M62.81)    Time: [id number] PT Time Calculation (min) (ACUTE ONLY): 27 min   Charges:   PT Evaluation $PT Eval Low Complexity: Rienzi, PT, DPT Acute Rehabilitation Pager: [phone]   Zenaida Niece 04/08/2019, 3:55 PM

## 2019-04-08 NOTE — Progress Notes (Signed)
CRITICAL VALUE ALERT  Critical Value:  K-4.8, patient is having a lot of PVCs this am. More frequently than the previous night   Date & Time Notied:  04/08/19 6:41 AM  Provider Notified:  E-Link  Orders Received/Actions taken: None

## 2019-04-08 NOTE — Progress Notes (Addendum)
NAME:  John Barton, MRN:  [id number], DOB:  [birth year], LOS: 29 ADMISSION DATE:  04/13/2019, CONSULTATION DATE: 04/06/19 REFERRING MD:  Dr. 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    Admitted on 10/10, febrile, getting treated for presumed endocarditis.   Cultures negative, TEE eventually was able to be done and was negative for vegetations.    On coumadin at home, was supratheraputic, coumadin reversed, now was getting bridged back to ac with coumadin, had been on heparin infusion.   This am became persistently hypotensive with drop in H/H.  Intermittent bradycardia.  CT abd done, large RP hematoma, heterogenous.     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 Reversed with protamine, also given Kcentra and vitamin K  Consults:  ID Nephrology Cardiology  Procedures:  10/19 Cordis RIJ 10/19 R radial art line  Significant Diagnostic Tests:   TEE 03/26/2019 1. No vegetation seen  2. Left ventricular ejection fraction, by visual estimation,  is 45 to 50%. The left ventricle has mildly decreased function. There is mildly increased left ventricular hypertrophy.  3. Global right ventricle has normal systolic function.The right ventricular size is normal.  4. Severe mitral annular calcification.  5. The mitral valve is degenerative. Mild to moderate mitral valve regurgitation.  6. The tricuspid valve is normal in structure. Tricuspid valve regurgitation is mild.  7. Mechanical prosthesis in the aortic valve position. No paravalvular leak  8. The pulmonic valve was not well visualized. Pulmonic valve regurgitation is not visualized by color flow Doppler.  Micro Data:  10/9 Bl cx neg  Antimicrobials:   Ceftriaxone 10/14 - current Daptomycin 10/13 --> current  Cefepime 10/9-10/13  Interim history/subjective:   04/08/2019  - Presumed endocarditis -since he is high risk, ID is recommending long-term ceftaroline with end date 05/15/2019. He is s/p 4 units PRBC. Off pressors. Not on vent. No delirium. Has pain and gets prn fentanyl - Pulse ox 100% on 2L [place]. Per RN has tendency to drop pulse ox when he gets fentanyl 32mcg prn. He refuses percocet. This is for flank pain. He refuses percocet  Not on anticoagulation yet. hgb 7.8 (was 8.2 yesterday)   Objective   Blood pressure 112/76, pulse 91, temperature 97.8 F (36.6 C), temperature source Oral, resp. rate (!) 29, height 5' 10.5" (1.791 m), weight 101.3 kg, SpO2 100 %.        Intake/Output Summary (Last 24 hours) at 04/08/2019 0839 Last data filed at 04/08/2019 0600 Gross per  24 hour  Intake 982.65 ml  Output 950 ml  Net 32.65 ml   Filed Weights   04/06/19 0353 04/07/19 0442 04/08/19 0500  Weight: 92.9 kg 101.2 kg 101.3 kg     General Appearance:  Looks well. Deconditioned.  Head:  Normocephalic, without obvious abnormality, atraumatic Eyes:  PERRL - yes, conjunctiva/corneas - muddy     Ears:  Normal external ear canals, both ears Nose:  G tube - no. Has Soper o2 +  Throat:  ETT TUBE - no , OG tube - no Neck:  Supple,  No enlargement/tenderness/nodules Lungs: Clear to auscultation bilaterally,  Heart:  S1 and S2 normal, no murmur, CVP - x.  Pressors - no Abdomen:  Soft, no masses, no organomegaly, Abd distended Genitalia / Rectal:  Not done Extremities:  Extremities- intact Skin:  ntact in exposed areas . Sacral area - not examined Neurologic:  Sedation - none -> RASS - +1 . Moves all 4s - yes. CAM-ICU - neg . Orientation - x3+      Resolved Hospital Problem list    Assessment & Plan:    Hypotension: 2/2  Large. RP hemorrhage due to anticoagulation - s/p massive transfusion (4 PRBC, 2 U FFP, K centra). CCS and IR recommend monitoring as of 04/07/2019   04/08/2019 - remains off pressors. No active bleeding. Small hgb drift down .     Plan MAP goal is > 65 mm Hg No anticoagulation   Transfuse  for goal hb >8.   Trend  INR PT.    Leukocytosis and fever:  Cellcept for renal transplant>>  immunocompromised  Fluid collection heterogenous, loculated, pelvis with smaller fluid collection.  Possible old hemorrhage vs abscess?  Bl cx negative.    04/08/2019 - afebrile x 48h   Plan Trend CBC and Fever Curve Cont abtx per ID. Re-culture as is clinically indicated  CXR prn     AKI, s/p kidney transplant.  Solitary Kidney  04/08/2019 - improved Ur Op. Creat better     Plan Monitor Avoid nephrotoxins [place] MAP and HR   Type 2 diabetes mellitus - A1c 6.6 in 2018 - glucose ok Plan  CBG's Q 4  SSI  Mechanical aortic valve with supratherapeutic INr - 6.6 on 04/01/2019 (peak)  04/08/2019 - INR 1.2 . No active bleed. Has some hgb drift today. Currently 48h without anticoagulation  Plan  - Consider restart IV heparin 04/09/19   -  Pharmacy to dose Coumadin to maintain INR between 2.5-3.     CAD s/p CABG 1995 Elevated troponin ? When  04/08/2019 - no chest pain. EKG looks nil acute - Plan -  Continue lipitor      -restart baby ASA    - check trop 04/09/19   Combined systolic and diastolic CHF  History of hypertension - Echo 10/10, EF 45 to 50%, unchanged. Plan - Hold Home meds include Lasix 40 mg daily, lisinopril 5 mg daily. lisinopril hold because of hypotension and AKI. - May need lasix, currently good respiratory status on  2L  Sinus bradycardia  - s/p loop recorder Plan      - Continue Tele       - Monitor for changes       - !2 Lead in am and prn  BPH  Plan  - continue Flomax.   Best practice:  Diet: Heart Healthy Pain/Anxiety/Delirium protocol (if indicated):Fentanyl 25-50 prn per E Link MD VAP protocol (if indicated: NA DVT prophylaxis: SCDs GI prophylaxis: None  Glucose control: CBG Q 4, Mobility: BR for now with goal of OOB once BP stable Code Status: Full Family Communication: patient at bedside  Disposition: to tele and TRH primary from 04/09/19 with ccm off (awaiting call from Dr Cyndia Skeeters)  ATTESTATION & SIGNATURE      Dr. Brand Males, M.D., [facility].C.P Pulmonary and Critical Care Medicine Staff Physician [facility] Pager: [phone], If no answer or between  15:00h - 7:00h: call [phone]  04/08/2019 8:54 AM     LABS    PULMONARY No results for input(s): PHART, PCO2ART, PO2ART, HCO3, TCO2, O2SAT in the last 168 hours.  Invalid input(s): PCO2, PO2  CBC Recent Labs  Lab 04/06/19 1710  04/07/19 0408 04/07/19 1330 04/07/19 2143 04/08/19 0332  HGB 11.0*   < > 9.8* 8.7* 8.2* 7.8*  HCT 32.4*   < > 28.5* 25.8* 24.4* 24.5*  WBC 16.6*  --  23.7*  --   --  21.2*  PLT 127*  --  133*  --   --  128*   < > = values in this interval not displayed.    COAGULATION Recent Labs  Lab 04/06/19 0428 04/06/19 1905 04/06/19 2316 04/07/19 0600 04/08/19 0332  INR 1.8* 1.1 1.2 1.1 1.2    CARDIAC  No results for input(s): TROPONINI in the last 168 hours. No  results for input(s): PROBNP in the last 168 hours.   CHEMISTRY Recent Labs  Lab 04/01/2019 0507 04/04/19 0503 04/05/19 0601 04/06/19 0428 04/06/19 1431 04/06/19 1710 04/07/19 0408 04/08/19 0332  NA 140 141 141 141 141 138 138 138  K 3.4* 3.2* 3.3* 3.5 4.5 4.9 5.0 4.8  CL 111 107 105 106  --  106 105 106  CO2 21* 23 25 25   --  21* 23 23  GLUCOSE 130* 126* 124* 173* 194* 210* 171* 141*  BUN 61* 39* 25* 24*  --  26* 33* 34*  CREATININE 3.20* 2.14* 1.60* 1.82*  --  1.82* 2.31* 1.96*  CALCIUM 7.1* 7.3* 7.4* 6.9*  --  6.2* 6.8* 6.8*  MG 2.1 1.8 1.4* 1.4*  --   --   --   --   PHOS 2.8 2.7 2.0* 3.8  --   --  4.9* 4.0   Estimated Creatinine Clearance: 39.2 mL/min (A) (by C-G formula based on SCr of 1.96 mg/dL (H)).   LIVER Recent Labs  Lab 04/05/19 0601 04/06/19 0428 04/06/19 1159 04/06/19 1905 04/06/19 2316 04/07/19 0408 04/07/19 0600 04/08/19 0332  AST  --   --  25  --   --   --   --  30  ALT  --   --  22  --   --   --   --  20  ALKPHOS  --   --  54  --   --   --   --  70  BILITOT  --   --  0.4  --   --   --   --  1.0  PROT  --   --  4.8*  --   --   --   --  5.2*  ALBUMIN 3.1* 2.9* 2.6*  --   --  2.7*  --  2.7*  INR 1.4* 1.8*  --  1.1 1.2  --  1.1 1.2     INFECTIOUS No results for input(s): LATICACIDVEN, PROCALCITON in the last 168 hours.   ENDOCRINE CBG (last 3)  Recent Labs    04/06/19 1044  GLUCAP 228*         IMAGING x48h  - image(s) personally visualized  -   highlighted in bold Ct Abdomen Pelvis Wo Contrast  Result Date: 04/06/2019 CLINICAL DATA:  Acute abdominal pain and left flank pain. EXAM: CT ABDOMEN AND PELVIS WITHOUT CONTRAST TECHNIQUE: Multidetector CT imaging of the abdomen and pelvis was performed following the standard protocol without IV contrast. COMPARISON:  CT scan dated 03/28/2019 FINDINGS: Other: There is a new left-sided large retroperitoneal inhomogeneous fluid collection measuring 22 x 11 x 23 cm extending from the level of the  left hemidiaphragm to the left superior iliac crest. In addition, there is an inhomogeneous fluid collection in the left iliacus muscle measuring 9 x 3 x 3 cm extending to the level of the inguinal ligament. These fluid collections could represent subacute hematomas or abscesses. Lower chest: Extensive aortic atherosclerosis and coronary artery calcifications. New small left pleural effusion and tiny right effusion. Slight bibasilar atelectasis, new on the right. Hepatobiliary: Liver parenchyma is normal. Tiny stones in the otherwise normal gallbladder, unchanged. No biliary ductal dilatation. Pancreas: Normal. Spleen: Normal. Adrenals/Urinary Tract: Adrenal glands are normal. Chronic severely atrophic right kidney. Left kidney is been removed. There is a transplant kidney in the left lower quadrant with no hydronephrosis. There is a 22 mm stone in the bladder, unchanged. Stomach/Bowel: No acute abnormality of the bowel. Sigmoid diverticulosis. There is a small amount of fluid in the left pericolic gutter. 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 Extensive aortic atherosclerosis. No adenopathy. Reproductive: Prostate is unremarkable. Musculoskeletal: There is soft tissue stranding around the muscles of the lateral and posterolateral aspects of the left anterior abdominal wall adjacent to the extensive new fluid collections in the left side of the abdomen. There is new subcutaneous edema in the left flank and lateral aspect of both hips, left greater than right. IMPRESSION: 1. New large inhomogeneous fluid collections in the left side of the abdomen and pelvis as described above. These could represent subacute hematomas or abscesses. 2. New subcutaneous edema in the left flank and lateral aspect of both hips, left greater than right. 3. 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 Electronically Signed   By: Lorriane Shire M.D.   On: 04/06/2019 12:48   Dg Abd 1 View   Result Date: 04/06/2019 CLINICAL DATA:  Left-sided abdominal pain and distention. EXAM: ABDOMEN - 1 VIEW COMPARISON:  None. FINDINGS: Gaseous distension of the stomach. Bowel loops are otherwise unremarkable. No findings to suggest bowel obstruction. No radio-opaque calculi or other significant radiographic abnormality are seen. IMPRESSION: Nonobstructive bowel gas pattern. Gaseous distension of the stomach, nonspecific. Electronically Signed   By: Kerby Moors M.D.   On: 04/06/2019 18:56   Dg Chest Port 1 View  Result Date: 04/06/2019 CLINICAL DATA:  Line placement EXAM: PORTABLE CHEST 1 VIEW COMPARISON:  03/21/2019 FINDINGS: Interval placement of a right neck vascular sheath, tip projecting in the vicinity of the right brachiocephalic vein. Otherwise unchanged AP portable examination with cardiomegaly status post median sternotomy and left basilar scarring or atelectasis. No acute appearing airspace opacity. IMPRESSION: 1. Interval placement of a right neck vascular sheath, tip projecting in the vicinity of the right brachiocephalic vein. 2. Otherwise unchanged AP portable examination with cardiomegaly status post median sternotomy and left basilar scarring or atelectasis. No acute appearing airspace opacity. Electronically Signed   By: Eddie Candle M.D.   On: 04/06/2019  14:27    

## 2019-04-08 NOTE — Plan of Care (Signed)

## 2019-04-09 ENCOUNTER — Inpatient Hospital Stay (HOSPITAL_COMMUNITY): Payer: Medicare Other

## 2019-04-09 HISTORY — PX: IR US GUIDE VASC ACCESS RIGHT: IMG2390

## 2019-04-09 HISTORY — PX: IR FLUORO GUIDE CV LINE RIGHT: IMG2283

## 2019-04-09 LAB — BASIC METABOLIC PANEL
Anion gap: 10 (ref 5–15)
Anion gap: 13 (ref 5–15)
BUN: 43 mg/dL — ABNORMAL HIGH (ref 8–23)
BUN: 44 mg/dL — ABNORMAL HIGH (ref 8–23)
CO2: 24 mmol/L (ref 22–32)
CO2: 22 mmol/L (ref 22–32)
Calcium: 6.8 mg/dL — ABNORMAL LOW (ref 8.9–10.3)
Calcium: 6.8 mg/dL — ABNORMAL LOW (ref 8.9–10.3)
Chloride: 104 mmol/L (ref 98–111)
Chloride: 104 mmol/L (ref 98–111)
Creatinine, Ser: 2.31 mg/dL — ABNORMAL HIGH (ref 0.61–1.24)
Creatinine, Ser: 2.39 mg/dL — ABNORMAL HIGH (ref 0.61–1.24)
GFR calc Af Amer: 31 mL/min — ABNORMAL LOW (ref >60)
GFR calc Af Amer: 30 mL/min — ABNORMAL LOW (ref >60)
GFR calc non Af Amer: 27 mL/min — ABNORMAL LOW (ref >60)
GFR calc non Af Amer: 26 mL/min — ABNORMAL LOW (ref >60)
Glucose, Bld: 152 mg/dL — ABNORMAL HIGH (ref 70–99)
Glucose, Bld: 167 mg/dL — ABNORMAL HIGH (ref 70–99)
Potassium: 5.8 mmol/L — ABNORMAL HIGH (ref 3.5–5.1)
Potassium: 5.4 mmol/L — ABNORMAL HIGH (ref 3.5–5.1)
Sodium: 138 mmol/L (ref 135–145)
Sodium: 139 mmol/L (ref 135–145)

## 2019-04-09 LAB — HEPATIC FUNCTION PANEL
ALT: 24 U/L (ref 0–44)
AST: 33 U/L (ref 15–41)
Albumin: 2.9 g/dL — ABNORMAL LOW (ref 3.5–5.0)
Alkaline Phosphatase: 56 U/L (ref 38–126)
Bilirubin, Direct: 0.2 mg/dL (ref 0.0–0.2)
Indirect Bilirubin: 0.8 mg/dL (ref 0.3–0.9)
Total Bilirubin: 1 mg/dL (ref 0.3–1.2)
Total Protein: 5.4 g/dL — ABNORMAL LOW (ref 6.5–8.1)

## 2019-04-09 LAB — GLUCOSE, CAPILLARY: Glucose-Capillary: 152 mg/dL — ABNORMAL HIGH (ref 70–99)

## 2019-04-09 LAB — PROTIME-INR
INR: 1.2 (ref 0.8–1.2)
Prothrombin Time: 14.7 seconds (ref 11.4–15.2)

## 2019-04-09 LAB — CBC
HCT: 23.2 % — ABNORMAL LOW (ref 39.0–52.0)
Hemoglobin: 7.2 g/dL — ABNORMAL LOW (ref 13.0–17.0)
MCH: 29.8 pg (ref 26.0–34.0)
MCHC: 31 g/dL (ref 30.0–36.0)
MCV: 95.9 fL (ref 80.0–100.0)
Platelets: 124 10*3/uL — ABNORMAL LOW (ref 150–400)
RBC: 2.42 MIL/uL — ABNORMAL LOW (ref 4.22–5.81)
RDW: 17.2 % — ABNORMAL HIGH (ref 11.5–15.5)
WBC: 18.2 10*3/uL — ABNORMAL HIGH (ref 4.0–10.5)
nRBC: 0.2 % (ref 0.0–0.2)

## 2019-04-09 LAB — TROPONIN I (HIGH SENSITIVITY)
Troponin I (High Sensitivity): 462 ng/L (ref <18)
Troponin I (High Sensitivity): 431 ng/L (ref <18)

## 2019-04-09 LAB — LACTIC ACID, PLASMA: Lactic Acid, Venous: 1.6 mmol/L (ref 0.5–1.9)

## 2019-04-09 LAB — PHOSPHORUS: Phosphorus: 4.5 mg/dL (ref 2.5–4.6)

## 2019-04-09 LAB — MAGNESIUM: Magnesium: 1.9 mg/dL (ref 1.7–2.4)

## 2019-04-09 MED ORDER — INSULIN ASPART 100 UNIT/ML ~~LOC~~ SOLN
0.0000 [IU] | Freq: Three times a day (TID) | SUBCUTANEOUS | Status: DC
  Administered 2019-04-10 – 2019-04-14 (×6): 1 [IU] via SUBCUTANEOUS

## 2019-04-09 MED ORDER — GELATIN ABSORBABLE 12-7 MM EX MISC
CUTANEOUS | Status: AC
  Filled 2019-04-09: qty 1

## 2019-04-09 MED ORDER — SODIUM ZIRCONIUM CYCLOSILICATE 10 G PO PACK
10.0000 g | PACK | Freq: Once | ORAL | Status: AC
  Administered 2019-04-09: 10 g via ORAL
  Filled 2019-04-09: qty 1

## 2019-04-09 MED ORDER — IPRATROPIUM-ALBUTEROL 0.5-2.5 (3) MG/3ML IN SOLN: 3.0000 mL | Freq: Four times a day (QID) | RESPIRATORY_TRACT | Status: DC | PRN

## 2019-04-09 MED ORDER — MORPHINE SULFATE (PF) 2 MG/ML IV SOLN
1.0000 mg | INTRAVENOUS | Status: DC | PRN
  Administered 2019-04-09 – 2019-04-11 (×6): 1 mg via INTRAVENOUS
  Filled 2019-04-09 (×8): qty 1

## 2019-04-09 MED ORDER — PANTOPRAZOLE SODIUM 40 MG PO TBEC
40.0000 mg | DELAYED_RELEASE_TABLET | Freq: Every day | ORAL | Status: DC
  Administered 2019-04-09 – 2019-04-11 (×3): 40 mg via ORAL
  Filled 2019-04-09 (×3): qty 1

## 2019-04-09 MED ORDER — FUROSEMIDE 10 MG/ML IJ SOLN
40.0000 mg | Freq: Once | INTRAMUSCULAR | Status: AC
  Administered 2019-04-09: 40 mg via INTRAVENOUS
  Filled 2019-04-09: qty 4

## 2019-04-09 MED ORDER — LIDOCAINE HCL 1 % IJ SOLN
INTRAMUSCULAR | Status: AC
  Filled 2019-04-09: qty 20

## 2019-04-09 MED ORDER — LIDOCAINE HCL (PF) 1 % IJ SOLN
INTRAMUSCULAR | Status: DC | PRN
  Administered 2019-04-09: 5 mL

## 2019-04-09 NOTE — Care Management Important Message (Signed)
Important Message  Patient Details  Name: John Barton MRN: [id number] Date of Birth: 12-Jun-1946   Medicare Important Message Given:  Yes     [name] 04/09/2019, 1:27 PM

## 2019-04-09 NOTE — Progress Notes (Signed)
Bladder scan at 1440. No bladder distension felt to palpation. 0 mL during bladder scan.   Paged DO Choi at 1445, informed bladder scan 0 mL & approx. 20 mL urine output in ext catheter since IV Lasix.no bladder distension observed.  I&O cath at 1600; 300 mL urine. Paged DO Choi at 1724; informed 360mL urine removed via I&O cath at 1600. patient now in IR for tunneled cath.

## 2019-04-09 NOTE — Progress Notes (Signed)
PT Cancellation Note  Patient Details Name: John Barton MRN: [id number] DOB: 07/30/1945   Cancelled Treatment:    Reason Eval/Treat Not Completed: Patient declined, no reason specified. PT attempted to see pt for treatment however pt declining therapy at this time, reporting that getting from the bed to bedside commode wore him out too much and he can't do therapy right now if moving that little wears him out. PT educates the pt on the need for continued mobility to improve endurance and activity tolerance, pt states he acknowledges this but he is in too much pain to attempt mobility. Pt states he recently received pain medication, yet also refuses bed level exercise. PT will attempt to follow up per PT plan of care.   Zenaida Niece 04/09/2019, 2:20 PM

## 2019-04-09 NOTE — Progress Notes (Signed)
Poor appetite due to bloating, did not want to eat breakfast.   Transducer removed approx 0958 by IV team and informed that dressing needs to remain in place for 24 hours.   Paged DO Choi at 1255, . Informed lost IV access before administering IV pain med .pt. wants pain meds now. oral option until new IV started?   Paged DO Choi at 0119, to clarify, you want tunneled catheter placed for longterm IV ABX or PICC line? Per callback Maylene Roes stated will place tunneled because of renal function.  IR to place tunneled cath today. Patient left floor for procedure at approx 1637. Returned to floor apporox 1850.  Paged NP Bl[place] at 33.Marland Kitchen Pt. wheezing, order PRN nebulizer?Marland Kitchen

## 2019-04-09 NOTE — Progress Notes (Signed)
Patient states his bladder feels full, abdomen is distended and patient had very little output. RN did bladder scan= 43mL.

## 2019-04-09 NOTE — Progress Notes (Signed)
Patient Status: [facility] - In-pt  Assessment and Plan: Patient in need of venous access for home antibiotics.  Patient with endocarditis in need of home antibiotics.  Planning for discharge soon.  History of CKD, will need tunneled central venous catheter.   Risks and benefits discussed with the patient including, but not limited to bleeding, infection, vascular injury, pneumothorax which may require chest tube placement, air embolism or even death  All of the patient's questions were answered, patient is agreeable to proceed. Consent signed and in chart.  ______________________________________________________________________   History of Present Illness: John Barton is a 73 y.o. male with past medical history of DM, COPD, aortic valve on coumadin, renal transplant admitted with endocarditis.  He also developed a large RP bleed treated conservatively with several units of PRBCs.  Patient currently bridging with heparin.  INR 1.2.  In need of tunneled central venous catheter for home infusions.  Allergies and medications reviewed.   Review of Systems: A 12 point ROS discussed and pertinent positives are indicated in the HPI above.  All other systems are negative.  Review of Systems  Constitutional: Positive for fatigue and fever.  Respiratory: Negative for cough and shortness of breath.   Cardiovascular: Negative for chest pain.  Gastrointestinal: Negative for abdominal pain, diarrhea, nausea and vomiting.  Musculoskeletal: Positive for back pain.  Psychiatric/Behavioral: Negative for behavioral problems and confusion.    Vital Signs: BP 118/61 (BP Location: Right Arm)   Pulse (!) 59   Temp 97.8 F (36.6 C) (Oral)   Resp 18   Ht 5' 10.5" (1.791 m)   Wt 225 lb 6.4 oz (102.2 kg) Comment: scale b  SpO2 98%   BMI 31.88 kg/m   Physical Exam Vitals signs and nursing note reviewed.  Constitutional:      Appearance: Normal appearance.  HENT:     Mouth/Throat:     Mouth:  Mucous membranes are moist.     Pharynx: Oropharynx is clear.  Neck:     Musculoskeletal: Normal range of motion and neck supple.  Cardiovascular:     Rate and Rhythm: Normal rate and regular rhythm.  Pulmonary:     Effort: Pulmonary effort is normal. No respiratory distress.     Breath sounds: Normal breath sounds.  Abdominal:     General: Abdomen is flat.     Palpations: Abdomen is soft.  Skin:    General: Skin is warm and dry.  Neurological:     General: No focal deficit present.     Mental Status: He is alert and oriented to person, place, and time.  Psychiatric:        Mood and Affect: Mood normal.        Behavior: Behavior normal.        Thought Content: Thought content normal.        Judgment: Judgment normal.      Imaging reviewed.   Labs:  COAGS: Recent Labs    04/06/19 2316 04/07/19 0600 04/08/19 0332 04/09/19 0500  INR 1.2 1.1 1.2 1.2    BMP: Recent Labs    04/07/19 0408 04/08/19 0332 04/09/19 0759 04/09/19 1304  NA 138 138 138 139  K 5.0 4.8 5.8* 5.4*  CL 105 106 104 104  CO2 23 23 24 22   GLUCOSE 171* 141* 152* 167*  BUN 33* 34* 43* 44*  CALCIUM 6.8* 6.8* 6.8* 6.8*  CREATININE 2.31* 1.96* 2.31* 2.39*  GFRNONAA 27* 32* 27* 26*  GFRAA 31* 38* 31* 30*  Electronically Signed: Docia Barrier, PA 04/09/2019, 4:45 PM   I spent a total of 15 minutes in face to face in clinical consultation, greater than 50% of which was counseling/coordinating care for venous access.

## 2019-04-09 NOTE — Procedures (Signed)
Infection, iv abx access  S/p Rt IJ tunneled picc  Tip svcra No comp Stable ebl min Ready for use Full report in pacs

## 2019-04-09 NOTE — Progress Notes (Signed)
Patient troponin 462, provider notified.

## 2019-04-09 NOTE — Progress Notes (Signed)
DBIV consult: Lab drawn from introducer, discussed removal with RN. She will address with MD during rounds.

## 2019-04-09 NOTE — Progress Notes (Signed)
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  Patient was admitted on 10/10 due to fevers.  Blood cultures were negative, patient underwent TEE which was negative for vegetation.  Infectious disease has been consulted, patient currently under treatment for culture-negative presumed endocarditis.  Patient was transferred to ICU on 10/19 secondary to hypotension with drop in hemoglobin.  CT abdomen revealed large retroperitoneal hematoma.  Patient underwent Coumadin reversal with protamine, Kcentra, vitamin K and received multiple blood product transfusions including packed red blood cells and FFP.  [facility] transferred to [facility] on 10/22.  New events last 24 hours / Subjective: Complaining of some lower abdominal discomfort as well as bloating.  Denies any chest pain.  Assessment & Plan:   Principal Problem:   Endocarditis Active Problems:   H/O prosthetic aortic valve replacement   Hx of CABG   Renal transplant recipient   Sepsis secondary to UTI [facility])   Community acquired pneumonia   AKI (acute kidney injury) ([place])   Fever   Abdominal discomfort   Culture-negative endocarditis, in immunocompromised state -Appreciate infectious disease -Continue ceftaroline, end date 11/27 -Follow-up with infectious disease 11/23 with Terri Piedra, NP -Per nephrology, patient should not have PICC line placed due to CKD, recommend IR to place central tunneled catheter  Hemorrhagic shock secondary to retroperitoneal hemorrhage -Patient is status post  protamine, Kcentra, vitamin K, 6 unit packed red blood cells, 2 unit FFP -Hemoglobin trended downward slightly 7.2.  Hold off on anticoagulation for now.  May resume tomorrow depending on hemoglobin -Blood pressure stable currently 112/64  Acute kidney injury on CKD stage IIIb -Patient has a solitary kidney and is status post kidney transplant.  Continue CellCept -Baseline creatinine 1.3.  Creatinine peaked at 6.09 and has slowly trended downward -Nephrology signed off 10/17.  Follow-up with Dr. Jimmy Footman on discharge  Type 2 diabetes, well controlled -Hemoglobin A1c 6.9 -Sliding-scale insulin  Mechanical aortic valve -Patient has been on Coumadin as an outpatient. INR goal 2.5-3 -Currently holding off on anticoagulation due to hemorrhagic shock  CAD status post CABG 1995 -Report of elevated troponin, although I cannot find previous troponin level in our computer system -Continue aspirin, Lipitor  Elevated troponin -Troponin 10/22 elevated 462 --> 431 -Denies any chest pain on my examination  Chronic combined systolic and diastolic heart failure -Echocardiogram 10/10 showed EF 45 to 50% -Patient's Lasix, lisinopril has been on hold due to hypotension and AKI -Due to patient's increase in volume status, will give 1 time dose of IV Lasix today and monitor I's and O's  Hyperkalemia -IV Lasix as above and repeat BMP this afternoon    DVT prophylaxis: SCDs Code Status: Full code Family Communication: None at bedside Disposition Plan: Pending further medical stabilization.  Physical therapy recommending home health   Consultants:   PCCM  Nephrology  Infectious disease  General surgery   Antimicrobials:  Anti-infectives (From admission, onward)   Start     Dose/Rate Route Frequency Ordered Stop   04/07/19 1630  ceftaroline (TEFLARO) 400 mg in sodium chloride 0.9 % 250 mL  IVPB     400 mg 250 mL/hr over 60 Minutes Intravenous Every 8 hours 04/07/19 1604     04/07/19  1400  ceftaroline (TEFLARO) 400 mg in sodium chloride 0.9 % 250 mL IVPB  Status:  Discontinued     400 mg 250 mL/hr over 60 Minutes Intravenous Every 8 hours 04/07/19 0936 04/07/19 1604   04/05/19 2000  DAPTOmycin (CUBICIN) 800 mg in sodium chloride 0.9 % IVPB  Status:  Discontinued     800 mg 232 mL/hr over 30 Minutes Intravenous Daily 04/05/19 0918 04/07/19 0936   04/01/19 1000  cefTRIAXone (ROCEPHIN) 2 g in sodium chloride 0.9 % 100 mL IVPB  Status:  Discontinued     2 g 200 mL/hr over 30 Minutes Intravenous Every 24 hours 03/31/19 1205 04/07/19 0936   03/31/19 2000  ceFEPIme (MAXIPIME) 1 g in sodium chloride 0.9 % 100 mL IVPB  Status:  Discontinued     1 g 200 mL/hr over 30 Minutes Intravenous Every 24 hours 03/31/19 0925 03/31/19 1205   03/31/19 1600  DAPTOmycin (CUBICIN) 800 mg in sodium chloride 0.9 % IVPB  Status:  Discontinued     800 mg 232 mL/hr over 30 Minutes Intravenous Every 48 hours 03/31/19 1209 04/05/19 0919   03/28/19 2200  vancomycin (VANCOCIN) 1,250 mg in sodium chloride 0.9 % 250 mL IVPB  Status:  Discontinued     1,250 mg 166.7 mL/hr over 90 Minutes Intravenous Every 24 hours 03/19/2019 2054 03/28/19 0824   03/28/19 2000  ceFEPIme (MAXIPIME) 2 g in sodium chloride 0.9 % 100 mL IVPB  Status:  Discontinued     2 g 200 mL/hr over 30 Minutes Intravenous Every 24 hours 03/28/19 0817 03/31/19 0925   03/28/19 0824  vancomycin variable dose per unstable renal function (pharmacist dosing)  Status:  Discontinued      Does not apply See admin instructions 03/28/19 0825 03/29/19 0958   03/28/19 0800  ceFEPIme (MAXIPIME) 2 g in sodium chloride 0.9 % 100 mL IVPB  Status:  Discontinued     2 g 200 mL/hr over 30 Minutes Intravenous Every 12 hours 03/23/2019 2054 03/28/19 0817   03/20/2019 1930  ceFEPIme (MAXIPIME) 2 g in sodium chloride 0.9 % 100 mL IVPB     2 g 200 mL/hr over 30 Minutes Intravenous  Once 04/13/2019 1921 03/20/2019 2055   03/29/2019 1930  metroNIDAZOLE (FLAGYL) IVPB 500 mg      500 mg 100 mL/hr over 60 Minutes Intravenous  Once 03/20/2019 1921 03/22/2019 2159   04/10/2019 1930  vancomycin (VANCOCIN) IVPB 1000 mg/200 mL premix  Status:  Discontinued     1,000 mg 200 mL/hr over 60 Minutes Intravenous  Once 04/18/2019 1921 03/22/2019 1925   04/04/2019 1930  vancomycin (VANCOCIN) 1,750 mg in sodium chloride 0.9 % 500 mL IVPB     1,750 mg 250 mL/hr over 120 Minutes Intravenous  Once 04/03/2019 1925 04/18/2019 2303        Objective: Vitals:   04/08/19 1949 04/09/19 0008 04/09/19 0441 04/09/19 0731  BP: 115/64 98/64 112/65 107/64  Pulse: 68 67 63 61  Resp: 18 18 20 18   Temp: 98.4 F (36.9 C) 98 F (36.7 C) 98.5 F (36.9 C) 97.6 F (36.4 C)  TempSrc: Oral Oral    SpO2: 94% 94% 98% 95%  Weight:   102.2 kg   Height:        Intake/Output Summary (Last 24 hours) at 04/09/2019 0813 Last data filed at 04/09/2019 0452 Gross per  24 hour  Intake 1515.11 ml  Output 400 ml  Net 1115.11 ml   Filed Weights   04/07/19 0442 04/08/19 0500 04/09/19 0441  Weight: 101.2 kg 101.3 kg 102.2 kg    Examination:  General exam: Appears calm and comfortable  Respiratory system: Clear to auscultation. Respiratory effort normal. No respiratory distress. No conversational dyspnea.  Cardiovascular system: S1 & S2 heard, RRR. No murmurs. Trace nonpitting pedal edema. Gastrointestinal system: Abdomen is mildly distended, soft and tender to palpation left lower quadrant. Normal bowel sounds heard. Central nervous system: Alert and oriented. No focal neurological deficits. Speech clear.  Extremities: Symmetric in appearance  Skin: No rashes, lesions or ulcers on exposed skin  Psychiatry: Judgement and insight appear normal. Mood & affect appropriate.   Data Reviewed: I have personally reviewed following labs and imaging studies  CBC: Recent Labs  Lab 04/18/2019 0507 04/04/19 0503  04/06/19 0428  04/06/19 1710  04/07/19 0408 04/07/19 1330 04/07/19 2143 04/08/19 0332 04/09/19 0500   WBC 10.4 10.8*   < > 13.5*  --  16.6*  --  23.7*  --   --  21.2* 18.2*  NEUTROABS 8.5* 8.3*  --   --   --  14.6*  --   --   --   --   --   --   HGB 10.7* 11.0*   < > 8.5*   < > 11.0*   < > 9.8* 8.7* 8.2* 7.8* 7.2*  HCT 32.3* 32.9*   < > 27.4*   < > 32.4*   < > 28.5* 25.8* 24.4* 24.5* 23.2*  MCV 87.8 87.7   < > 90.4  --  87.3  --  85.8  --   --  90.7 95.9  PLT 186 193   < > 239  --  127*  --  133*  --   --  128* 124*   < > = values in this interval not displayed.   Basic Metabolic Panel: Recent Labs  Lab 04/08/2019 0507 04/04/19 0503 04/05/19 0601 04/06/19 0428 04/06/19 1431 04/06/19 1710 04/07/19 0408 04/08/19 0332 04/09/19 0500  NA 140 141 141 141 141 138 138 138  --   K 3.4* 3.2* 3.3* 3.5 4.5 4.9 5.0 4.8  --   CL 111 107 105 106  --  106 105 106  --   CO2 21* 23 25 25   --  21* 23 23  --   GLUCOSE 130* 126* 124* 173* 194* 210* 171* 141*  --   BUN 61* 39* 25* 24*  --  26* 33* 34*  --   CREATININE 3.20* 2.14* 1.60* 1.82*  --  1.82* 2.31* 1.96*  --   CALCIUM 7.1* 7.3* 7.4* 6.9*  --  6.2* 6.8* 6.8*  --   MG 2.1 1.8 1.4* 1.4*  --   --   --   --  1.9  PHOS 2.8 2.7 2.0* 3.8  --   --  4.9* 4.0 4.5   GFR: Estimated Creatinine Clearance: 39.3 mL/min (A) (by C-G formula based on SCr of 1.96 mg/dL (H)). Liver Function Tests: Recent Labs  Lab 04/06/19 0428 04/06/19 1159 04/07/19 0408 04/08/19 0332 04/09/19 0500  AST  --  25  --  30 33  ALT  --  22  --  20 24  ALKPHOS  --  54  --  70 56  BILITOT  --  0.4  --  1.0 1.0  PROT  --  4.8*  --  5.2*  5.4*  ALBUMIN 2.9* 2.6* 2.7* 2.7* 2.9*   Recent Labs  Lab 04/06/19 1024  LIPASE 60*   No results for input(s): AMMONIA in the last 168 hours. Coagulation Profile: Recent Labs  Lab 04/06/19 1905 04/06/19 2316 04/07/19 0600 04/08/19 0332 04/09/19 0500  INR 1.1 1.2 1.1 1.2 1.2   Cardiac Enzymes: Recent Labs  Lab 03/25/2019 0507  CKTOTAL 188   BNP (last 3 results) No results for input(s): PROBNP in the last 8760 hours. HbA1C:  No results for input(s): HGBA1C in the last 72 hours. CBG: Recent Labs  Lab 04/05/19 0730 04/06/19 1044  GLUCAP 122* 228*   Lipid Profile: No results for input(s): CHOL, HDL, LDLCALC, TRIG, CHOLHDL, LDLDIRECT in the last 72 hours. Thyroid Function Tests: No results for input(s): TSH, T4TOTAL, FREET4, T3FREE, THYROIDAB in the last 72 hours. Anemia Panel: No results for input(s): VITAMINB12, FOLATE, FERRITIN, TIBC, IRON, RETICCTPCT in the last 72 hours. Sepsis Labs: Recent Labs  Lab 04/09/19 [id number]  LATICACIDVEN 1.6    No results found for this or any previous visit (from the past 240 hour(s)).    Radiology Studies: No results found.    Scheduled Meds: . aspirin EC  81 mg Oral Daily  . atorvastatin  80 mg Oral Daily  . calcitRIOL  0.5 mcg Oral Once per day on Tue Thu Sat   And  . calcitRIOL  1 mcg Oral Once per day on Sun Mon Wed Fri  . Chlorhexidine Gluconate Cloth  6 each Topical Daily  . influenza vaccine adjuvanted  0.5 mL Intramuscular Tomorrow-1000  . mouth rinse  15 mL Mouth Rinse BID  . mycophenolate  1,000 mg Oral BID  . pantoprazole (PROTONIX) IV  40 mg Intravenous Q24H  . pneumococcal 23 valent vaccine  0.5 mL Intramuscular Tomorrow-1000  . sodium bicarbonate  1,300 mg Oral BID  . sodium chloride flush  10-40 mL Intracatheter Q12H  . tamsulosin  0.4 mg Oral Daily   Continuous Infusions: . sodium chloride 10 mL/hr at 04/09/19 0422  . ceFTAROline (TEFLARO) IV Stopped (04/09/19 0316)     LOS: 13 days      Time spent: 45 minutes   Dessa Phi, DO [facility] 04/09/2019, 8:13 AM   Available via Epic secure chat 7am-7pm After these hours, please refer to coverage provider listed on [email or web address]

## 2019-04-10 ENCOUNTER — Encounter (HOSPITAL_COMMUNITY): Payer: Self-pay | Admitting: Interventional Radiology

## 2019-04-10 LAB — BASIC METABOLIC PANEL
Anion gap: 9 (ref 5–15)
BUN: 49 mg/dL — ABNORMAL HIGH (ref 8–23)
CO2: 25 mmol/L (ref 22–32)
Calcium: 7 mg/dL — ABNORMAL LOW (ref 8.9–10.3)
Chloride: 105 mmol/L (ref 98–111)
Creatinine, Ser: 2.57 mg/dL — ABNORMAL HIGH (ref 0.61–1.24)
GFR calc Af Amer: 27 mL/min — ABNORMAL LOW (ref >60)
GFR calc non Af Amer: 23 mL/min — ABNORMAL LOW (ref >60)
Glucose, Bld: 147 mg/dL — ABNORMAL HIGH (ref 70–99)
Potassium: 5.5 mmol/L — ABNORMAL HIGH (ref 3.5–5.1)
Sodium: 139 mmol/L (ref 135–145)

## 2019-04-10 LAB — TYPE AND SCREEN
ABO/RH(D): A NEG
Antibody Screen: NEGATIVE
Unit division: 0
Unit division: 0
Unit division: 0
Unit division: 0
Unit division: 0
Unit division: 0
Unit division: 0
Unit division: 0
Unit division: 0
Unit division: 0
Unit division: 0
Unit division: 0
Unit division: 0
Unit division: 0
Unit division: 0
Unit division: 0

## 2019-04-10 LAB — BPAM RBC
Blood Product Expiration Date: 202010272359
Blood Product Expiration Date: 202010282359
Blood Product Expiration Date: 202010282359
Blood Product Expiration Date: 202010282359
Blood Product Expiration Date: 202011032359
Blood Product Expiration Date: 202011052359
Blood Product Expiration Date: 202011062359
Blood Product Expiration Date: 202011072359
Blood Product Expiration Date: 202011112359
Blood Product Expiration Date: 202011112359
Blood Product Expiration Date: 202011112359
Blood Product Expiration Date: 202011112359
Blood Product Expiration Date: 202011122359
Blood Product Expiration Date: 202011122359
Blood Product Expiration Date: 202011122359
Blood Product Expiration Date: 202011132359
ISSUE DATE / TIME: 202010191304
ISSUE DATE / TIME: 202010191304
ISSUE DATE / TIME: 202010191304
ISSUE DATE / TIME: 202010191304
ISSUE DATE / TIME: 202010191310
ISSUE DATE / TIME: 202010191310
ISSUE DATE / TIME: 202010191310
ISSUE DATE / TIME: 202010191310
ISSUE DATE / TIME: 202010191320
ISSUE DATE / TIME: 202010191320
ISSUE DATE / TIME: 202010191939
ISSUE DATE / TIME: 202010192228
Unit Type and Rh: 600
Unit Type and Rh: 600
Unit Type and Rh: 600
Unit Type and Rh: 600
Unit Type and Rh: 6200
Unit Type and Rh: 6200
Unit Type and Rh: 6200
Unit Type and Rh: 6200
Unit Type and Rh: 6200
Unit Type and Rh: 6200
Unit Type and Rh: 6200
Unit Type and Rh: 6200
Unit Type and Rh: 6200
Unit Type and Rh: 6200
Unit Type and Rh: 6200
Unit Type and Rh: 6200

## 2019-04-10 LAB — GLUCOSE, CAPILLARY
Glucose-Capillary: 129 mg/dL — ABNORMAL HIGH (ref 70–99)
Glucose-Capillary: 144 mg/dL — ABNORMAL HIGH (ref 70–99)
Glucose-Capillary: 135 mg/dL — ABNORMAL HIGH (ref 70–99)
Glucose-Capillary: 123 mg/dL — ABNORMAL HIGH (ref 70–99)

## 2019-04-10 LAB — URINALYSIS, ROUTINE W REFLEX MICROSCOPIC
Bilirubin Urine: NEGATIVE
Glucose, UA: NEGATIVE mg/dL
Hgb urine dipstick: NEGATIVE
Ketones, ur: NEGATIVE mg/dL
Leukocytes,Ua: NEGATIVE
Nitrite: NEGATIVE
Protein, ur: 30 mg/dL — AB
Specific Gravity, Urine: 1.019 (ref 1.005–1.030)
pH: 5 (ref 5.0–8.0)

## 2019-04-10 LAB — CBC
HCT: 23.8 % — ABNORMAL LOW (ref 39.0–52.0)
Hemoglobin: 7.1 g/dL — ABNORMAL LOW (ref 13.0–17.0)
MCH: 29.2 pg (ref 26.0–34.0)
MCHC: 29.8 g/dL — ABNORMAL LOW (ref 30.0–36.0)
MCV: 97.9 fL (ref 80.0–100.0)
Platelets: 135 10*3/uL — ABNORMAL LOW (ref 150–400)
RBC: 2.43 MIL/uL — ABNORMAL LOW (ref 4.22–5.81)
RDW: 17.4 % — ABNORMAL HIGH (ref 11.5–15.5)
WBC: 15.1 10*3/uL — ABNORMAL HIGH (ref 4.0–10.5)
nRBC: 0.3 % — ABNORMAL HIGH (ref 0.0–0.2)

## 2019-04-10 MED ORDER — FUROSEMIDE 10 MG/ML IJ SOLN
80.0000 mg | Freq: Once | INTRAMUSCULAR | Status: AC
  Administered 2019-04-10: 80 mg via INTRAVENOUS
  Filled 2019-04-10: qty 8

## 2019-04-10 MED ORDER — FUROSEMIDE 10 MG/ML IJ SOLN
80.0000 mg | Freq: Two times a day (BID) | INTRAMUSCULAR | Status: AC
  Administered 2019-04-10 – 2019-04-11 (×2): 80 mg via INTRAVENOUS
  Filled 2019-04-10 (×2): qty 8

## 2019-04-10 NOTE — Progress Notes (Addendum)
PT Cancellation Note  Patient Details Name: John Barton MRN: [id number] DOB: Jun 23, 1945   Cancelled Treatment:    Reason Eval/Treat Not Completed: Patient declined, no reason specified;Pain limiting ability to participate Just given morphine and wants to sleep. Reported he tried to get up with nursing this AM and unable. Will follow.  Attempted to see again in PM, declined due to pain despite pain meds. WIll follow.   Marguarite Arbour A Hoang Reich 04/10/2019, 9:43 AM Wray Kearns, PT, DPT Acute Rehabilitation Services Pager [phone] Office [phone]

## 2019-04-10 NOTE — Progress Notes (Signed)
[facility]    NEPHROLOGY PROGRESS NOTE  SUBJECTIVE: Asked to reevaluate patient for worsening renal function.  Patient complaining of just generally feeling poorly, hiccups, and abdominal distention.  Denies chest pain.  Complains of left flank pain.  Feels uncomfortable and short of breath with significant movement.  Denies nausea or vomiting.  All other review of systems are negative.    OBJECTIVE:  Vitals:   04/10/19 1050 04/10/19 1210  BP:  130/62  Pulse: (!) 50 (!) 55  Resp:    Temp: 97.7 F (36.5 C)   SpO2: 96%     Intake/Output Summary (Last 24 hours) at 04/10/2019 1730 Last data filed at 04/10/2019 1439 Gross per 24 hour  Intake 706.44 ml  Output 300 ml  Net 406.44 ml      General:  AAOx3 NAD HEENT: MMM [place] AT anicteric sclera Neck:  No JVD, no adenopathy CV:  Heart RRR  Lungs:  L/S CTA bilaterally Abd:  abd distended with normal BS GU:  Bladder non-palpable Extremities: +3 bilateral lower extremity edema Skin:  No skin rash  MEDICATIONS:  . aspirin EC  81 mg Oral Daily  . atorvastatin  80 mg Oral Daily  . calcitRIOL  0.5 mcg Oral Once per day on Tue Thu Sat   And  . calcitRIOL  1 mcg Oral Once per day on Sun Mon Wed Fri  . Chlorhexidine Gluconate Cloth  6 each Topical Daily  . influenza vaccine adjuvanted  0.5 mL Intramuscular Tomorrow-1000  . insulin aspart  0-9 Units Subcutaneous TID WC  . mouth rinse  15 mL Mouth Rinse BID  . mycophenolate  1,000 mg Oral BID  . pantoprazole  40 mg Oral Daily  . pneumococcal 23 valent vaccine  0.5 mL Intramuscular Tomorrow-1000  . sodium bicarbonate  1,300 mg Oral BID  . sodium chloride flush  10-40 mL Intracatheter Q12H  . tamsulosin  0.4 mg Oral Daily       LABS:   CBC Latest Ref Rng & Units 04/10/2019 04/09/2019 04/08/2019  WBC 4.0 - 10.5 K/uL 15.1(H) 18.2(H) 21.2(H)  Hemoglobin 13.0 - 17.0 g/dL 7.1(L) 7.2(L) 7.8(L)  Hematocrit 39.0 - 52.0 % 23.8(L) 23.2(L) 24.5(L)  Platelets 150 - 400  K/uL 135(L) 124(L) 128(L)    CMP Latest Ref Rng & Units 04/10/2019 04/09/2019 04/09/2019  Glucose 70 - 99 mg/dL 147(H) 167(H) 152(H)  BUN 8 - 23 mg/dL 49(H) 44(H) 43(H)  Creatinine 0.61 - 1.24 mg/dL 2.57(H) 2.39(H) 2.31(H)  Sodium 135 - 145 mmol/L 139 139 138  Potassium 3.5 - 5.1 mmol/L 5.5(H) 5.4(H) 5.8(H)  Chloride 98 - 111 mmol/L 105 104 104  CO2 22 - 32 mmol/L 25 22 24   Calcium 8.9 - 10.3 mg/dL 7.0(L) 6.8(L) 6.8(L)  Total Protein 6.5 - 8.1 g/dL - - -  Total Bilirubin 0.3 - 1.2 mg/dL - - -  Alkaline Phos 38 - 126 U/L - - -  AST 15 - 41 U/L - - -  ALT 0 - 44 U/L - - -    Lab Results  Component Value Date   CALCIUM 7.0 (L) 04/10/2019   PHOS 4.5 04/09/2019       Component Value Date/Time   COLORURINE AMBER (A) 04/10/2019 1600   APPEARANCEUR CLEAR 04/10/2019 1600   LABSPEC 1.019 04/10/2019 1600   PHURINE 5.0 04/10/2019 1600   GLUCOSEU NEGATIVE 04/10/2019 1600   HGBUR NEGATIVE 04/10/2019 1600   BILIRUBINUR NEGATIVE 04/10/2019 1600   BILIRUBINUR Negative 11/17/2013   KETONESUR NEGATIVE  04/10/2019 1600   PROTEINUR 30 (A) 04/10/2019 1600   UROBILINOGEN 0.2 12/04/2008 1835   NITRITE NEGATIVE 04/10/2019 1600   LEUKOCYTESUR NEGATIVE 04/10/2019 1600   No results found for: PHART, PCO2ART, PO2ART, HCO3, TCO2, ACIDBASEDEF, O2SAT     Component Value Date/Time   FERRITIN 293 04/11/2019 1912       ASSESSMENT/PLAN:    1. AKI/CKD stage 3 presumably due to ischemic ATN in setting of volume depletion and hypotension.  1. Creatinine worsening again with exam consistent with volume overload.  No urgent indication for HD at this time.  Will add additional furosemide.  Recent renal transplant ultrasound with no evidence of obstruction or increased resistive indices. 2. Sepsis with hypotension- blood cultures negative to date, TTE with oscillating density in the mitral valve and awaiting TEE negative for vegetation. 1. Status post dapto and ceftriaxone per ID. 3. Diarrhea-  resolved. 4. Hyperkalemia.  Likely due to resorption of blood in retroperitoneum 5. Metabolic acidosis- improved.  DC sodium bicarbonate. 6. Hematuria-Foley moved  7. Renal transplant from sister only taking cellcept.changed to myfortic due to diarrhea but will change back to cellcept which is what he has been on for years. 8. Disposition- when creat stable, can follow up with Dr. Jimmy Footman above.  Please call with any questions or concerns.      [place], DO, [place]

## 2019-04-10 NOTE — Progress Notes (Signed)
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  Patient was admitted on 10/10 due to fevers.  Blood cultures were negative, patient underwent TEE which was negative for vegetation.  Infectious disease has been consulted, patient currently under treatment for culture-negative presumed endocarditis.  Patient was transferred to ICU on 10/19 secondary to hypotension with drop in hemoglobin.  CT abdomen revealed large retroperitoneal hematoma.  Patient underwent Coumadin reversal with protamine, Kcentra, vitamin K and received multiple blood product transfusions including packed red blood cells and FFP.  [facility] transferred to [facility] on 10/22.  New events last 24 hours / Subjective: Complaining of some lower abdominal discomfort as well as bloating.  Denies any chest pain.  Assessment & Plan:   Principal Problem:   Endocarditis Active Problems:   H/O prosthetic aortic valve replacement   Hx of CABG   Renal transplant recipient   Sepsis secondary to UTI [facility])   Community acquired pneumonia   AKI (acute kidney injury) ([place])   Fever   Abdominal discomfort   Culture-negative endocarditis, in immunocompromised state -Appreciate infectious disease -Continue ceftaroline, end date 11/27 -Follow-up with infectious disease 11/23 with Terri Piedra, NP -Per nephrology, patient should not have PICC line placed due to CKD. IR placed central tunneled catheter on 10/22   Hemorrhagic shock secondary to retroperitoneal hemorrhage -Patient is status post  protamine, Kcentra, vitamin K, 6 unit packed red blood cells, 2 unit FFP -Hemoglobin trended downward slightly 7.1.  Hold off on anticoagulation for now.  May resume tomorrow depending on hemoglobin trend -Blood pressure stable currently 111/95  -Trend CBC   Acute kidney injury on CKD stage IIIb -Patient has a solitary kidney and is status post kidney transplant 1998.  Continue CellCept -Baseline creatinine 1.3.  Creatinine peaked at 6.09 and has slowly trended downward to 1.6. Nephrology signed off 10/17.  Recommended to follow-up with Dr. Jimmy Footman on discharge -Over the past several days, Cr has trended up again, today to 2.57. He received dose of IV lasix on 10/22 due to fluid overload and only has made 314m UOP.  -Re-consult Nephrology today   Type 2 diabetes, well controlled -Hemoglobin A1c 6.9 -Sliding-scale insulin  Mechanical aortic valve -Patient has been on Coumadin as an outpatient. INR goal 2.5-3 -Currently holding off on anticoagulation due to hemorrhagic shock  CAD status post CABG 1995 -Report of elevated troponin, although I cannot find previous troponin level in our computer system -Continue aspirin, Lipitor  Elevated troponin -Troponin 10/22 elevated 462 --> 431 -Denies any chest pain on my examination  Chronic combined systolic and diastolic heart failure -Echocardiogram 10/10 showed EF 45 to 50% -Patient's Lasix, lisinopril has been on hold due to hypotension and AKI -Due to patient's increase in volume status, gave give 1 time dose of IV Lasix 10/22 but only has made 3019mUOP     DVT prophylaxis: SCDs Code Status: Full code Family Communication: None at bedside Disposition Plan: Pending further medical stabilization.  Physical therapy recommending home health. Nephrology re-consulted today.    Consultants:   PCCM  Nephrology  Infectious disease  General surgery   Antimicrobials:  Anti-infectives (From admission, onward)   Start      Dose/Rate Route Frequency Ordered Stop   04/07/19 1630  ceftaroline (TEFLARO) 400 mg in sodium chloride 0.9 % 250 mL IVPB     400 mg 250 mL/hr over 60 Minutes Intravenous Every 8 hours 04/07/19 1604     04/07/19 1400  ceftaroline (TEFLARO) 400 mg in sodium chloride 0.9 % 250 mL IVPB  Status:  Discontinued     400 mg 250 mL/hr over 60 Minutes Intravenous Every 8 hours 04/07/19 0936 04/07/19 1604   04/05/19 2000  DAPTOmycin (CUBICIN) 800 mg in sodium chloride 0.9 % IVPB  Status:  Discontinued     800 mg 232 mL/hr over 30 Minutes Intravenous Daily 04/05/19 0918 04/07/19 0936   04/01/19 1000  cefTRIAXone (ROCEPHIN) 2 g in sodium chloride 0.9 % 100 mL IVPB  Status:  Discontinued     2 g 200 mL/hr over 30 Minutes Intravenous Every 24 hours 03/31/19 1205 04/07/19 0936   03/31/19 2000  ceFEPIme (MAXIPIME) 1 g in sodium chloride 0.9 % 100 mL IVPB  Status:  Discontinued     1 g 200 mL/hr over 30 Minutes Intravenous Every 24 hours 03/31/19 0925 03/31/19 1205   03/31/19 1600  DAPTOmycin (CUBICIN) 800 mg in sodium chloride 0.9 % IVPB  Status:  Discontinued     800 mg 232 mL/hr over 30 Minutes Intravenous Every 48 hours 03/31/19 1209 04/05/19 0919   03/28/19 2200  vancomycin (VANCOCIN) 1,250 mg in sodium chloride 0.9 % 250 mL IVPB  Status:  Discontinued     1,250 mg 166.7 mL/hr over 90 Minutes Intravenous Every 24 hours 04/04/2019 2054 03/28/19 0824   03/28/19 2000  ceFEPIme (MAXIPIME) 2 g in sodium chloride 0.9 % 100 mL IVPB  Status:  Discontinued     2 g 200 mL/hr over 30 Minutes Intravenous Every 24 hours 03/28/19 0817 03/31/19 0925   03/28/19 0824  vancomycin variable dose per unstable renal function (pharmacist dosing)  Status:  Discontinued      Does not apply See admin instructions 03/28/19 0825 03/29/19 0958   03/28/19 0800  ceFEPIme (MAXIPIME) 2 g in sodium chloride 0.9 % 100 mL IVPB  Status:  Discontinued     2 g 200 mL/hr over 30 Minutes Intravenous Every 12 hours 03/24/2019 2054 03/28/19 0817     03/26/2019 1930  ceFEPIme (MAXIPIME) 2 g in sodium chloride 0.9 % 100 mL IVPB     2 g 200 mL/hr over 30 Minutes Intravenous  Once 04/09/2019 1921 03/28/2019 2055   04/07/2019 1930  metroNIDAZOLE (FLAGYL) IVPB 500 mg     500 mg 100 mL/hr over 60 Minutes Intravenous  Once 04/11/2019 1921 03/29/2019 2159   04/17/2019 1930  vancomycin (VANCOCIN) IVPB 1000 mg/200 mL premix  Status:  Discontinued     1,000 mg 200 mL/hr over 60 Minutes Intravenous  Once 03/20/2019 1921 04/02/2019 1925   03/24/2019 1930  vancomycin (VANCOCIN) 1,750 mg in sodium chloride 0.9 % 500 mL IVPB     1,750 mg 250 mL/hr over 120 Minutes Intravenous  Once 04/15/2019 1925 04/06/2019 2303       Objective: Vitals:   04/09/19 2133 04/10/19 0051 04/10/19 0422 04/10/19 0613  BP: 120/63 116/65 (!) 111/95   Pulse: (!) 55 (!) 55 (!) 56   Resp: 18 18 18    Temp: 98 F (36.7 C) 97.8 F (36.6 C) 98.6 F (37 C)   TempSrc:  Oral Oral    SpO2: 95% 96% 96%   Weight:    108 kg  Height:        Intake/Output Summary (Last 24 hours) at 04/10/2019 1021 Last data filed at 04/10/2019 0002 Gross per 24 hour  Intake 1293.81 ml  Output 321 ml  Net 972.81 ml   Filed Weights   04/08/19 0500 04/09/19 0441 04/10/19 0613  Weight: 101.3 kg 102.2 kg 108 kg    Examination: General exam: Appears calm and comfortable  Respiratory system: Diminished breath sounds, no distress noted but was  tachypenic  Cardiovascular system: S1 & S2 heard, RRR. +pitting bilateral pedal edema. Gastrointestinal system: Abdomen is mildly distended, soft and TTP LLQ. Normal bowel sounds heard.  Central nervous system: Alert and oriented. Non focal exam. Speech clear  Extremities: Symmetric in appearance bilaterally  Skin: No rashes, lesions or ulcers on exposed skin  Psychiatry: Judgement and insight appear stable. Mood & affect appropriate.    Data Reviewed: I have personally reviewed following labs and imaging studies  CBC: Recent Labs  Lab 04/04/19 0503  04/06/19 1710   04/07/19 0408 04/07/19 1330 04/07/19 2143 04/08/19 0332 04/09/19 0500 04/10/19 0435  WBC 10.8*   < > 16.6*  --  23.7*  --   --  21.2* 18.2* 15.1*  NEUTROABS 8.3*  --  14.6*  --   --   --   --   --   --   --   HGB 11.0*   < > 11.0*   < > 9.8* 8.7* 8.2* 7.8* 7.2* 7.1*  HCT 32.9*   < > 32.4*   < > 28.5* 25.8* 24.4* 24.5* 23.2* 23.8*  MCV 87.7   < > 87.3  --  85.8  --   --  90.7 95.9 97.9  PLT 193   < > 127*  --  133*  --   --  128* 124* 135*   < > = values in this interval not displayed.   Basic Metabolic Panel: Recent Labs  Lab 04/04/19 0503 04/05/19 0601 04/06/19 0428  04/07/19 0408 04/08/19 0332 04/09/19 0500 04/09/19 0759 04/09/19 1304 04/10/19 0435  NA 141 141 141   < > 138 138  --  138 139 139  K 3.2* 3.3* 3.5   < > 5.0 4.8  --  5.8* 5.4* 5.5*  CL 107 105 106   < > 105 106  --  104 104 105  CO2 23 25 25    < > 23 23  --  24 22 25   GLUCOSE 126* 124* 173*   < > 171* 141*  --  152* 167* 147*  BUN 39* 25* 24*   < > 33* 34*  --  43* 44* 49*  CREATININE 2.14* 1.60* 1.82*   < > 2.31* 1.96*  --  2.31* 2.39* 2.57*  CALCIUM 7.3* 7.4* 6.9*   < > 6.8* 6.8*  --  6.8* 6.8* 7.0*  MG 1.8 1.4* 1.4*  --   --   --  1.9  --   --   --   PHOS 2.7 2.0* 3.8  --  4.9* 4.0 4.5  --   --   --    < > = values in this interval not displayed.   GFR: Estimated Creatinine Clearance: 30.8 mL/min (A) (by C-G formula based on SCr of 2.57 mg/dL (H)). Liver Function Tests: Recent Labs  Lab 04/06/19 0428 04/06/19 1159 04/07/19 0408 04/08/19 0332 04/09/19 0500  AST  --  25  --  30 33  ALT  --  22  --  20 24  ALKPHOS  --  54  --  70 56  BILITOT  --  0.4  --  1.0 1.0  PROT  --  4.8*  --  5.2* 5.4*  ALBUMIN 2.9* 2.6* 2.7* 2.7* 2.9*   Recent Labs  Lab 04/06/19 1024  LIPASE 60*   No results for input(s): AMMONIA in the last 168 hours. Coagulation Profile: Recent Labs  Lab 04/06/19 1905 04/06/19 2316 04/07/19 0600 04/08/19 0332 04/09/19 0500  INR 1.1 1.2 1.1 1.2 1.2   Cardiac  Enzymes: No results for input(s): CKTOTAL, CKMB, CKMBINDEX, TROPONINI in the last 168 hours. BNP (last 3 results) No results for input(s): PROBNP in the last 8760 hours. HbA1C: No results for input(s): HGBA1C in the last 72 hours. CBG: Recent Labs  Lab 04/05/19 0730 04/06/19 1044 04/09/19 2129 04/10/19 0658  GLUCAP 122* 228* 152* 129*   Lipid Profile: No results for input(s): CHOL, HDL, LDLCALC, TRIG, CHOLHDL, LDLDIRECT in the last 72 hours. Thyroid Function Tests: No results for input(s): TSH, T4TOTAL, FREET4, T3FREE, THYROIDAB in the last 72 hours. Anemia Panel: No results for input(s): VITAMINB12, FOLATE, FERRITIN, TIBC, IRON, RETICCTPCT in the last 72 hours. Sepsis Labs: Recent Labs  Lab 04/09/19 4235  LATICACIDVEN 1.6    No results found for this or any previous visit (from the past 240 hour(s)).    Radiology Studies: US Renal Transplant W/doppler  Result Date: 04/09/2019 CLINICAL DATA:  Acute kidney injury. LEFT transplant kidney in March 1998. EXAM: ULTRASOUND OF RENAL TRANSPLANT WITH RENAL DOPPLER ULTRASOUND TECHNIQUE: Ultrasound examination of the renal transplant was performed with gray-scale, color and duplex doppler evaluation. COMPARISON:  CT of the abdomen and pelvis on 04/06/2019 FINDINGS: Transplant kidney location: LEFT iliac fossa Transplant Kidney: Renal measurements: 12.7 x 6.5 x 5.2 centimeter = volume: 222.40m. Normal in size and parenchymal echogenicity. No evidence of mass or hydronephrosis. No peri-transplant fluid collection seen. Color flow in the main renal artery:  Present Color flow in the main renal vein:  Present Duplex Doppler Evaluation: Main Renal Artery Resistive Index: 0.96 Venous waveform in main renal vein:  Present Intrarenal resistive index in upper pole:  0.84 (normal 0.6-0.8; equivocal 0.8-0.9; abnormal >= 0.9) Intrarenal resistive index in lower pole: 0.90 (normal 0.6-0.8; equivocal 0.8-0.9; abnormal >= 0.9) Bladder: Normal for degree of  bladder distention. Other findings:  None. IMPRESSION: 1. Normal ultrasound appearance of the transplant kidney in the LEFT iliac fossa. 2. No hydronephrosis. 3. Resistive indices are equivocal. Electronically Signed   By: ENolon NationsM.D.   On: 04/09/2019 19:04   Ir Fluoro Guide Cv Line Right  Result Date: 04/10/2019 INDICATION: Endocarditis, access for long-term antibiotics EXAM: TUNNELED PICC LINE WITH ULTRASOUND AND FLUOROSCOPIC GUIDANCE MEDICATIONS: 1% lidocaine local. The antibiotic was given in an appropriate time interval prior to skin puncture. ANESTHESIA/SEDATION: Moderate Sedation Time:  None. The patient was continuously monitored during the procedure by the interventional radiology nurse under my direct supervision. FLUOROSCOPY TIME:  Fluoroscopy Time: 0 minutes 12 seconds (1 mGy). COMPLICATIONS: None immediate. PROCEDURE: Informed written consent was obtained from the patient after a discussion of the risks, benefits, and alternatives to treatment. Questions regarding the procedure were encouraged and answered. The right neck and chest were prepped with chlorhexidine in a sterile fashion, and a sterile drape was applied covering the operative field. Maximum barrier sterile technique with sterile gowns and gloves were used for the  procedure. A timeout was performed prior to the initiation of the procedure. After creating a small venotomy incision, a micropuncture kit was utilized to access the right internal jugular vein under direct, real-time ultrasound guidance after the overlying soft tissues were anesthetized with 1% lidocaine with epinephrine. Ultrasound image documentation was performed. The microwire was kinked to measure appropriate catheter length. The micropuncture sheath was exchanged for a peel-away sheath over a guidewire. A 5 French dual lumen tunneled PICC measuring 27 cm was tunneled in a retrograde fashion from the anterior chest wall to the venotomy incision. The catheter  was then placed through the peel-away sheath with tip ultimately positioned at the superior caval-atrial junction. Final catheter positioning was confirmed and documented with a spot radiographic image. The catheter aspirates and flushes normally. The catheter was flushed with appropriate volume heparin dwells. The catheter exit site was secured with a 0-Prolene retention suture. The venotomy incision was closed with Dermabond. Dressings were applied. The patient tolerated the procedure well without immediate post procedural complication. FINDINGS: After catheter placement, the tip lies within the superior cavoatrial junction. The catheter aspirates and flushes normally and is ready for immediate use. IMPRESSION: Successful placement of 27cm dual lumen tunneled PICC catheter via the right internal jugular vein with tip terminating at the superior caval atrial junction. The catheter is ready for immediate use. Electronically Signed   By: Jerilynn Mages.  Shick M.D.   On: 04/10/2019 08:04   Ir US Guide Vasc Access Right  Result Date: 04/10/2019 INDICATION: Endocarditis, access for long-term antibiotics EXAM: TUNNELED PICC LINE WITH ULTRASOUND AND FLUOROSCOPIC GUIDANCE MEDICATIONS: 1% lidocaine local. The antibiotic was given in an appropriate time interval prior to skin puncture. ANESTHESIA/SEDATION: Moderate Sedation Time:  None. The patient was continuously monitored during the procedure by the interventional radiology nurse under my direct supervision. FLUOROSCOPY TIME:  Fluoroscopy Time: 0 minutes 12 seconds (1 mGy). COMPLICATIONS: None immediate. PROCEDURE: Informed written consent was obtained from the patient after a discussion of the risks, benefits, and alternatives to treatment. Questions regarding the procedure were encouraged and answered. The right neck and chest were prepped with chlorhexidine in a sterile fashion, and a sterile drape was applied covering the operative field. Maximum barrier sterile technique  with sterile gowns and gloves were used for the procedure. A timeout was performed prior to the initiation of the procedure. After creating a small venotomy incision, a micropuncture kit was utilized to access the right internal jugular vein under direct, real-time ultrasound guidance after the overlying soft tissues were anesthetized with 1% lidocaine with epinephrine. Ultrasound image documentation was performed. The microwire was kinked to measure appropriate catheter length. The micropuncture sheath was exchanged for a peel-away sheath over a guidewire. A 5 French dual lumen tunneled PICC measuring 27 cm was tunneled in a retrograde fashion from the anterior chest wall to the venotomy incision. The catheter was then placed through the peel-away sheath with tip ultimately positioned at the superior caval-atrial junction. Final catheter positioning was confirmed and documented with a spot radiographic image. The catheter aspirates and flushes normally. The catheter was flushed with appropriate volume heparin dwells. The catheter exit site was secured with a 0-Prolene retention suture. The venotomy incision was closed with Dermabond. Dressings were applied. The patient tolerated the procedure well without immediate post procedural complication. FINDINGS: After catheter placement, the tip lies within the superior cavoatrial junction. The catheter aspirates and flushes normally and is ready for immediate use. IMPRESSION: Successful placement of 27cm dual lumen tunneled PICC  catheter via the right internal jugular vein with tip terminating at the superior caval atrial junction. The catheter is ready for immediate use. Electronically Signed   By: Jerilynn Mages.  Shick M.D.   On: 04/10/2019 08:04      Scheduled Meds:  aspirin EC  81 mg Oral Daily   atorvastatin  80 mg Oral Daily   calcitRIOL  0.5 mcg Oral Once per day on Tue Thu Sat   And   calcitRIOL  1 mcg Oral Once per day on Sun Mon Wed Fri   Chlorhexidine  Gluconate Cloth  6 each Topical Daily   influenza vaccine adjuvanted  0.5 mL Intramuscular Tomorrow-1000   insulin aspart  0-9 Units Subcutaneous TID WC   mouth rinse  15 mL Mouth Rinse BID   mycophenolate  1,000 mg Oral BID   pantoprazole  40 mg Oral Daily   pneumococcal 23 valent vaccine  0.5 mL Intramuscular Tomorrow-1000   sodium bicarbonate  1,300 mg Oral BID   sodium chloride flush  10-40 mL Intracatheter Q12H   tamsulosin  0.4 mg Oral Daily   Continuous Infusions:  sodium chloride 250 mL (04/10/19 0445)   ceFTAROline (TEFLARO) IV 400 mg (04/10/19 0446)     LOS: 14 days      Time spent: 40 minutes   Dessa Phi, DO [facility] 04/10/2019, 10:21 AM   Available via Epic secure chat 7am-7pm After these hours, please refer to coverage provider listed on [email or web address]

## 2019-04-11 ENCOUNTER — Inpatient Hospital Stay (HOSPITAL_COMMUNITY): Payer: Medicare Other | Admitting: Anesthesiology

## 2019-04-11 ENCOUNTER — Inpatient Hospital Stay (HOSPITAL_COMMUNITY): Payer: Medicare Other

## 2019-04-11 DIAGNOSIS — I469 Cardiac arrest, cause unspecified: Secondary | ICD-10-CM | POA: Diagnosis not present

## 2019-04-11 LAB — BASIC METABOLIC PANEL
Anion gap: 11 (ref 5–15)
Anion gap: 15 (ref 5–15)
Anion gap: 13 (ref 5–15)
Anion gap: 13 (ref 5–15)
BUN: 56 mg/dL — ABNORMAL HIGH (ref 8–23)
BUN: 59 mg/dL — ABNORMAL HIGH (ref 8–23)
BUN: 59 mg/dL — ABNORMAL HIGH (ref 8–23)
BUN: 60 mg/dL — ABNORMAL HIGH (ref 8–23)
CO2: 25 mmol/L (ref 22–32)
CO2: 22 mmol/L (ref 22–32)
CO2: 23 mmol/L (ref 22–32)
CO2: 23 mmol/L (ref 22–32)
Calcium: 6.9 mg/dL — ABNORMAL LOW (ref 8.9–10.3)
Calcium: 6.4 mg/dL — CL (ref 8.9–10.3)
Calcium: 7 mg/dL — ABNORMAL LOW (ref 8.9–10.3)
Calcium: 6.8 mg/dL — ABNORMAL LOW (ref 8.9–10.3)
Chloride: 103 mmol/L (ref 98–111)
Chloride: 103 mmol/L (ref 98–111)
Chloride: 105 mmol/L (ref 98–111)
Chloride: 105 mmol/L (ref 98–111)
Creatinine, Ser: 2.63 mg/dL — ABNORMAL HIGH (ref 0.61–1.24)
Creatinine, Ser: 2.76 mg/dL — ABNORMAL HIGH (ref 0.61–1.24)
Creatinine, Ser: 2.58 mg/dL — ABNORMAL HIGH (ref 0.61–1.24)
Creatinine, Ser: 2.6 mg/dL — ABNORMAL HIGH (ref 0.61–1.24)
GFR calc Af Amer: 26 mL/min — ABNORMAL LOW (ref >60)
GFR calc Af Amer: 25 mL/min — ABNORMAL LOW (ref >60)
GFR calc Af Amer: 27 mL/min — ABNORMAL LOW (ref >60)
GFR calc Af Amer: 27 mL/min — ABNORMAL LOW (ref >60)
GFR calc non Af Amer: 23 mL/min — ABNORMAL LOW (ref >60)
GFR calc non Af Amer: 21 mL/min — ABNORMAL LOW (ref >60)
GFR calc non Af Amer: 23 mL/min — ABNORMAL LOW (ref >60)
GFR calc non Af Amer: 23 mL/min — ABNORMAL LOW (ref >60)
Glucose, Bld: 133 mg/dL — ABNORMAL HIGH (ref 70–99)
Glucose, Bld: 286 mg/dL — ABNORMAL HIGH (ref 70–99)
Glucose, Bld: 113 mg/dL — ABNORMAL HIGH (ref 70–99)
Glucose, Bld: 96 mg/dL (ref 70–99)
Potassium: 6 mmol/L — ABNORMAL HIGH (ref 3.5–5.1)
Potassium: 4.9 mmol/L (ref 3.5–5.1)
Potassium: 5.2 mmol/L — ABNORMAL HIGH (ref 3.5–5.1)
Potassium: 4.8 mmol/L (ref 3.5–5.1)
Sodium: 139 mmol/L (ref 135–145)
Sodium: 140 mmol/L (ref 135–145)
Sodium: 141 mmol/L (ref 135–145)
Sodium: 141 mmol/L (ref 135–145)

## 2019-04-11 LAB — CBC WITH DIFFERENTIAL/PLATELET
Abs Immature Granulocytes: 0 10*3/uL (ref 0.00–0.07)
Abs Immature Granulocytes: 0.79 10*3/uL — ABNORMAL HIGH (ref 0.00–0.07)
Basophils Absolute: 0 10*3/uL (ref 0.0–0.1)
Basophils Absolute: 0 10*3/uL (ref 0.0–0.1)
Basophils Relative: 0 %
Basophils Relative: 0 %
Eosinophils Absolute: 0 10*3/uL (ref 0.0–0.5)
Eosinophils Absolute: 0.1 10*3/uL (ref 0.0–0.5)
Eosinophils Relative: 0 %
Eosinophils Relative: 0 %
HCT: 24.4 % — ABNORMAL LOW (ref 39.0–52.0)
HCT: 23.6 % — ABNORMAL LOW (ref 39.0–52.0)
Hemoglobin: 7.5 g/dL — ABNORMAL LOW (ref 13.0–17.0)
Hemoglobin: 7.4 g/dL — ABNORMAL LOW (ref 13.0–17.0)
Immature Granulocytes: 5 %
Lymphocytes Relative: 2 %
Lymphocytes Relative: 2 %
Lymphs Abs: 0.3 10*3/uL — ABNORMAL LOW (ref 0.7–4.0)
Lymphs Abs: 0.4 10*3/uL — ABNORMAL LOW (ref 0.7–4.0)
MCH: 30.2 pg (ref 26.0–34.0)
MCH: 29.8 pg (ref 26.0–34.0)
MCHC: 30.7 g/dL (ref 30.0–36.0)
MCHC: 31.4 g/dL (ref 30.0–36.0)
MCV: 98.4 fL (ref 80.0–100.0)
MCV: 95.2 fL (ref 80.0–100.0)
Monocytes Absolute: 0.5 10*3/uL (ref 0.1–1.0)
Monocytes Absolute: 1 10*3/uL (ref 0.1–1.0)
Monocytes Relative: 3 %
Monocytes Relative: 7 %
Neutro Abs: 16.1 10*3/uL — ABNORMAL HIGH (ref 1.7–7.7)
Neutro Abs: 13 10*3/uL — ABNORMAL HIGH (ref 1.7–7.7)
Neutrophils Relative %: 95 %
Neutrophils Relative %: 86 %
Platelets: 121 10*3/uL — ABNORMAL LOW (ref 150–400)
Platelets: 132 10*3/uL — ABNORMAL LOW (ref 150–400)
RBC: 2.48 MIL/uL — ABNORMAL LOW (ref 4.22–5.81)
RBC: 2.48 MIL/uL — ABNORMAL LOW (ref 4.22–5.81)
RDW: 17.8 % — ABNORMAL HIGH (ref 11.5–15.5)
RDW: 17.5 % — ABNORMAL HIGH (ref 11.5–15.5)
WBC: 16.9 10*3/uL — ABNORMAL HIGH (ref 4.0–10.5)
WBC: 15.3 10*3/uL — ABNORMAL HIGH (ref 4.0–10.5)
nRBC: 0.7 % — ABNORMAL HIGH (ref 0.0–0.2)
nRBC: 1 /100 WBC — ABNORMAL HIGH
nRBC: 0.7 % — ABNORMAL HIGH (ref 0.0–0.2)

## 2019-04-11 LAB — GLUCOSE, CAPILLARY
Glucose-Capillary: 118 mg/dL — ABNORMAL HIGH (ref 70–99)
Glucose-Capillary: 132 mg/dL — ABNORMAL HIGH (ref 70–99)
Glucose-Capillary: 146 mg/dL — ABNORMAL HIGH (ref 70–99)
Glucose-Capillary: 122 mg/dL — ABNORMAL HIGH (ref 70–99)
Glucose-Capillary: 95 mg/dL (ref 70–99)

## 2019-04-11 LAB — HEPATIC FUNCTION PANEL
ALT: 154 U/L — ABNORMAL HIGH (ref 0–44)
AST: 180 U/L — ABNORMAL HIGH (ref 15–41)
Albumin: 2.7 g/dL — ABNORMAL LOW (ref 3.5–5.0)
Alkaline Phosphatase: 67 U/L (ref 38–126)
Bilirubin, Direct: 0.5 mg/dL — ABNORMAL HIGH (ref 0.0–0.2)
Indirect Bilirubin: 1.1 mg/dL — ABNORMAL HIGH (ref 0.3–0.9)
Total Bilirubin: 1.6 mg/dL — ABNORMAL HIGH (ref 0.3–1.2)
Total Protein: 5.2 g/dL — ABNORMAL LOW (ref 6.5–8.1)

## 2019-04-11 LAB — POCT I-STAT 7, (LYTES, BLD GAS, ICA,H+H)
Bicarbonate: 25.5 mmol/L (ref 20.0–28.0)
Calcium, Ion: 0.97 mmol/L — ABNORMAL LOW (ref 1.15–1.40)
HCT: 21 % — ABNORMAL LOW (ref 39.0–52.0)
Hemoglobin: 7.1 g/dL — ABNORMAL LOW (ref 13.0–17.0)
O2 Saturation: 100 %
Patient temperature: 98.6
Potassium: 5.1 mmol/L (ref 3.5–5.1)
Sodium: 139 mmol/L (ref 135–145)
TCO2: 27 mmol/L (ref 22–32)
pCO2 arterial: 42.8 mmHg (ref 32.0–48.0)
pH, Arterial: 7.383 (ref 7.350–7.450)
pO2, Arterial: 195 mmHg — ABNORMAL HIGH (ref 83.0–108.0)

## 2019-04-11 LAB — MAGNESIUM
Magnesium: 2 mg/dL (ref 1.7–2.4)
Magnesium: 1.7 mg/dL (ref 1.7–2.4)

## 2019-04-11 LAB — TROPONIN I (HIGH SENSITIVITY)
Troponin I (High Sensitivity): 243 ng/L (ref <18)
Troponin I (High Sensitivity): 257 ng/L (ref <18)

## 2019-04-11 LAB — LACTIC ACID, PLASMA
Lactic Acid, Venous: 4.7 mmol/L (ref 0.5–1.9)
Lactic Acid, Venous: 1.2 mmol/L (ref 0.5–1.9)

## 2019-04-11 LAB — CBC
HCT: 24.5 % — ABNORMAL LOW (ref 39.0–52.0)
HCT: 26 % — ABNORMAL LOW (ref 39.0–52.0)
Hemoglobin: 7.4 g/dL — ABNORMAL LOW (ref 13.0–17.0)
Hemoglobin: 7.6 g/dL — ABNORMAL LOW (ref 13.0–17.0)
MCH: 29.7 pg (ref 26.0–34.0)
MCH: 29.9 pg (ref 26.0–34.0)
MCHC: 30.2 g/dL (ref 30.0–36.0)
MCHC: 29.2 g/dL — ABNORMAL LOW (ref 30.0–36.0)
MCV: 98.4 fL (ref 80.0–100.0)
MCV: 102.4 fL — ABNORMAL HIGH (ref 80.0–100.0)
Platelets: 139 10*3/uL — ABNORMAL LOW (ref 150–400)
Platelets: 146 10*3/uL — ABNORMAL LOW (ref 150–400)
RBC: 2.49 MIL/uL — ABNORMAL LOW (ref 4.22–5.81)
RBC: 2.54 MIL/uL — ABNORMAL LOW (ref 4.22–5.81)
RDW: 17.5 % — ABNORMAL HIGH (ref 11.5–15.5)
RDW: 17.8 % — ABNORMAL HIGH (ref 11.5–15.5)
WBC: 14.5 10*3/uL — ABNORMAL HIGH (ref 4.0–10.5)
WBC: 16.1 10*3/uL — ABNORMAL HIGH (ref 4.0–10.5)
nRBC: 0.4 % — ABNORMAL HIGH (ref 0.0–0.2)
nRBC: 1.2 % — ABNORMAL HIGH (ref 0.0–0.2)

## 2019-04-11 LAB — ECHOCARDIOGRAM COMPLETE
Height: 70 in
Weight: 3795.44 oz

## 2019-04-11 LAB — PHOSPHORUS: Phosphorus: 7.1 mg/dL — ABNORMAL HIGH (ref 2.5–4.6)

## 2019-04-11 LAB — VITAMIN D 25 HYDROXY (VIT D DEFICIENCY, FRACTURES): Vit D, 25-Hydroxy: 8.41 ng/mL — ABNORMAL LOW (ref 30–100)

## 2019-04-11 MED ORDER — SCOPOLAMINE 1 MG/3DAYS TD PT72
1.0000 | MEDICATED_PATCH | TRANSDERMAL | Status: DC
  Administered 2019-04-11: 1.5 mg via TRANSDERMAL
  Filled 2019-04-11: qty 1

## 2019-04-11 MED ORDER — SODIUM ZIRCONIUM CYCLOSILICATE 10 G PO PACK
10.0000 g | PACK | Freq: Once | ORAL | Status: AC
  Administered 2019-04-11: 10 g via ORAL
  Filled 2019-04-11: qty 1

## 2019-04-11 MED ORDER — PROPOFOL 1000 MG/100ML IV EMUL
INTRAVENOUS | Status: AC
  Administered 2019-04-11: 10 ug/kg/min via INTRAVENOUS
  Filled 2019-04-11: qty 100

## 2019-04-11 MED ORDER — FENTANYL CITRATE (PF) 100 MCG/2ML IJ SOLN: 25.0000 ug | INTRAMUSCULAR | Status: DC | PRN

## 2019-04-11 MED ORDER — DEXTROSE 50 % IV SOLN
25.0000 g | Freq: Once | INTRAVENOUS | Status: AC
  Administered 2019-04-11: 25 g via INTRAVENOUS
  Filled 2019-04-11: qty 50

## 2019-04-11 MED ORDER — MIDAZOLAM HCL 2 MG/2ML IJ SOLN
INTRAMUSCULAR | Status: AC
  Filled 2019-04-11: qty 2

## 2019-04-11 MED ORDER — CALCIUM CARBONATE 1250 (500 CA) MG PO TABS
1.0000 | ORAL_TABLET | Freq: Two times a day (BID) | ORAL | Status: DC
  Administered 2019-04-11: 500 mg via ORAL
  Filled 2019-04-11 (×2): qty 1

## 2019-04-11 MED ORDER — FENTANYL CITRATE (PF) 100 MCG/2ML IJ SOLN
25.0000 ug | INTRAMUSCULAR | Status: DC | PRN
  Administered 2019-04-11: 14:00:00 100 ug via INTRAVENOUS
  Filled 2019-04-11: qty 2

## 2019-04-11 MED ORDER — FENTANYL 2500MCG IN NS 250ML (10MCG/ML) PREMIX INFUSION
0.0000 ug/h | INTRAVENOUS | Status: DC
  Administered 2019-04-11: 125 ug/h via INTRAVENOUS
  Administered 2019-04-12: 175 ug/h via INTRAVENOUS
  Filled 2019-04-11 (×2): qty 250

## 2019-04-11 MED ORDER — ORAL CARE MOUTH RINSE
15.0000 mL | OROMUCOSAL | Status: DC
  Administered 2019-04-11 – 2019-04-12 (×9): 15 mL via OROMUCOSAL

## 2019-04-11 MED ORDER — PROPOFOL 1000 MG/100ML IV EMUL
0.0000 ug/kg/min | INTRAVENOUS | Status: DC
  Administered 2019-04-11: 14:00:00 10 ug/kg/min via INTRAVENOUS

## 2019-04-11 MED ORDER — FENTANYL CITRATE (PF) 100 MCG/2ML IJ SOLN
INTRAMUSCULAR | Status: AC
  Administered 2019-04-11: 100 ug via INTRAVENOUS
  Filled 2019-04-11: qty 2

## 2019-04-11 MED ORDER — CALCIUM GLUCONATE-NACL 1-0.675 GM/50ML-% IV SOLN
1.0000 g | Freq: Once | INTRAVENOUS | Status: AC
  Administered 2019-04-11: 1000 mg via INTRAVENOUS
  Filled 2019-04-11: qty 50

## 2019-04-11 MED ORDER — MIDAZOLAM HCL 2 MG/2ML IJ SOLN: 2.0000 mg | INTRAMUSCULAR | Status: DC | PRN

## 2019-04-11 MED ORDER — FENTANYL 2500MCG IN NS 250ML (10MCG/ML) PREMIX INFUSION: 25.0000 ug/h | INTRAVENOUS | Status: DC

## 2019-04-11 MED ORDER — NOREPINEPHRINE 4 MG/250ML-% IV SOLN
0.0000 ug/min | INTRAVENOUS | Status: DC
  Filled 2019-04-11: qty 250

## 2019-04-11 MED ORDER — INSULIN ASPART 100 UNIT/ML IV SOLN
10.0000 [IU] | Freq: Once | INTRAVENOUS | Status: AC
  Administered 2019-04-11: 10 [IU] via INTRAVENOUS

## 2019-04-11 MED ORDER — MAGNESIUM SULFATE IN D5W 1-5 GM/100ML-% IV SOLN
1.0000 g | Freq: Once | INTRAVENOUS | Status: AC
  Administered 2019-04-12: 1 g via INTRAVENOUS
  Filled 2019-04-11: qty 100

## 2019-04-11 MED ORDER — MYCOPHENOLATE 200 MG/ML ORAL SUSPENSION
1000.0000 mg | Freq: Two times a day (BID) | ORAL | Status: DC
  Administered 2019-04-11 – 2019-04-12 (×2): 1000 mg via ORAL
  Filled 2019-04-11 (×2): qty 20

## 2019-04-11 MED ORDER — CHLORHEXIDINE GLUCONATE 0.12% ORAL RINSE (MEDLINE KIT)
15.0000 mL | Freq: Two times a day (BID) | OROMUCOSAL | Status: DC
  Administered 2019-04-11 – 2019-04-12 (×2): 15 mL via OROMUCOSAL

## 2019-04-11 MED ORDER — SODIUM CHLORIDE 0.9% FLUSH: 10.0000 mL | INTRAVENOUS | Status: DC | PRN

## 2019-04-11 MED ORDER — HEPARIN (PORCINE) 25000 UT/250ML-% IV SOLN
1200.0000 [IU]/h | INTRAVENOUS | Status: DC
  Filled 2019-04-11: qty 250

## 2019-04-11 MED ORDER — FENTANYL BOLUS VIA INFUSION: 25.0000 ug | INTRAVENOUS | Status: DC | PRN

## 2019-04-11 MED ORDER — FENTANYL CITRATE (PF) 100 MCG/2ML IJ SOLN: 25.0000 ug | Freq: Once | INTRAMUSCULAR | Status: DC

## 2019-04-11 MED ORDER — SODIUM CHLORIDE 0.9% FLUSH
10.0000 mL | Freq: Two times a day (BID) | INTRAVENOUS | Status: DC
  Administered 2019-04-13: 11:00:00 10 mL
  Administered 2019-04-14: 02:00:00 20 mL

## 2019-04-11 MED ORDER — CALCIUM GLUCONATE-NACL 1-0.675 GM/50ML-% IV SOLN
1.0000 g | Freq: Once | INTRAVENOUS | Status: DC
  Filled 2019-04-11 (×2): qty 50

## 2019-04-11 NOTE — Progress Notes (Signed)
Patient having frequent episodes of bradycardia with HR down to 30s with frequent PVCs (ventricular bigeminy) and long pauses. EKG taken (see results review) and called to CCM. QT interval on EKG-534 ms also mentioned to CCM. Patient alert and oriented and asymptomatic at this time.  See MD notes for orders.

## 2019-04-11 NOTE — Progress Notes (Signed)
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  Patient was admitted on 10/10 due to fevers.  Blood cultures were negative, patient underwent TEE which was negative for vegetation.  Infectious disease has been consulted, patient currently under treatment for culture-negative presumed endocarditis.  Patient was transferred to ICU on 10/19 secondary to hypotension with drop in hemoglobin.  CT abdomen revealed large retroperitoneal hematoma.  Patient underwent Coumadin reversal with protamine, Kcentra, vitamin K and received multiple blood product transfusions including packed red blood cells and FFP.  [facility] transferred [facility] on 10/22.  Due to slowly worsening kidney function, nephrology was reconsulted.  New events last 24 hours / Subjective: No new complaints.  His lower abdominal discomfort is about a 4 out of 10.  No worsening shortness of breath at rest but gets worst with activity. He has diuresed 1842m last 24 hours.   Assessment & Plan:   Principal Problem:   Endocarditis Active Problems:   H/O prosthetic aortic valve replacement   Hx of CABG   Renal transplant recipient   Sepsis secondary to UTI ([facility]   Community acquired pneumonia   [place] ([place] kidney [place]) [place]   Fever   Abdominal discomfort   Culture-negative endocarditis, in immunocompromised state -Appreciate infectious disease -Continue ceftaroline, end date 11/27 -Follow-up with infectious disease 11/23 with GTerri Piedra NP -Per nephrology, patient should not have  PICC line placed due to CKD. IR placed central tunneled catheter on 10/22   Hemorrhagic shock secondary to retroperitoneal hemorrhage -Patient is status post protamine, Kcentra, vitamin K, 6 unit packed red blood cells, 2 unit FFP -Hemoglobin has remained stable in the range of 7.  Will resume anticoagulation today closely monitor CBC  Acute kidney injury on CKD stage IIIb -Patient has a solitary kidney and is status post kidney transplant 1998.  Continue CellCept -Baseline creatinine 1.3.  Creatinine peaked at 6.09 and has slowly trended downward to 1.6. Nephrology signed off 10/17.  Recommended to follow-up with Dr. DJimmy Footmanon discharge -Over the past several days, Cr has trended up again.  Nephrology reconsulted 10/23 -Continue to trend BMP.  He received IV Lasix x 3 per nephrology -Strict I's and O's  Hyperkalemia -Lasix and Lokelma ordered -Check EKG  Type 2 diabetes, well controlled -Hemoglobin A1c 6.9 -Sliding-scale insulin  Mechanical aortic valve -Patient has been on Coumadin as an outpatient. INR goal 2.5-3 -Resume anticoagulation today  CAD status post CABG 1995 -Report of elevated troponin, although I cannot find previous troponin level in our computer system -Continue aspirin, Lipitor  Elevated troponin -Troponin 10/22 elevated 462 --> 431 -Denies any chest pain   Chronic combined systolic and diastolic heart failure -Echocardiogram 10/10 showed EF 45 to 50% -Patient's Lasix, lisinopril has been on hold due to hypotension and AKI -Due to patient's increase in volume status, gave give 1 time dose of IV Lasix 10/22 but only has made 3078mUOP  -Lasix IV as above    DVT prophylaxis: IV heparin Code Status: Full code Family Communication: None at bedside Disposition Plan: Pending further medical  stabilization.  Physical therapy recommending home health.    Consultants:   PCCM  Nephrology  Infectious disease  General surgery   Antimicrobials:    Anti-infectives (From admission, onward)   Start     Dose/Rate Route Frequency Ordered Stop   04/07/19 1630  ceftaroline (TEFLARO) 400 mg in sodium chloride 0.9 % 250 mL IVPB     400 mg 250 mL/hr over 60 Minutes Intravenous Every 8 hours 04/07/19 1604     04/07/19 1400  ceftaroline (TEFLARO) 400 mg in sodium chloride 0.9 % 250 mL IVPB  Status:  Discontinued     400 mg 250 mL/hr over 60 Minutes Intravenous Every 8 hours 04/07/19 0936 04/07/19 1604   04/05/19 2000  DAPTOmycin (CUBICIN) 800 mg in sodium chloride 0.9 % IVPB  Status:  Discontinued     800 mg 232 mL/hr over 30 Minutes Intravenous Daily 04/05/19 0918 04/07/19 0936   04/01/19 1000  cefTRIAXone (ROCEPHIN) 2 g in sodium chloride 0.9 % 100 mL IVPB  Status:  Discontinued     2 g 200 mL/hr over 30 Minutes Intravenous Every 24 hours 03/31/19 1205 04/07/19 0936   03/31/19 2000  ceFEPIme (MAXIPIME) 1 g in sodium chloride 0.9 % 100 mL IVPB  Status:  Discontinued     1 g 200 mL/hr over 30 Minutes Intravenous Every 24 hours 03/31/19 0925 03/31/19 1205   03/31/19 1600  DAPTOmycin (CUBICIN) 800 mg in sodium chloride 0.9 % IVPB  Status:  Discontinued     800 mg 232 mL/hr over 30 Minutes Intravenous Every 48 hours 03/31/19 1209 04/05/19 0919   03/28/19 2200  vancomycin (VANCOCIN) 1,250 mg in sodium chloride 0.9 % 250 mL IVPB  Status:  Discontinued     1,250 mg 166.7 mL/hr over 90 Minutes Intravenous Every 24 hours 04/17/2019 2054 03/28/19 0824   03/28/19 2000  ceFEPIme (MAXIPIME) 2 g in sodium chloride 0.9 % 100 mL IVPB  Status:  Discontinued     2 g 200 mL/hr over 30 Minutes Intravenous Every 24 hours 03/28/19 0817 03/31/19 0925   03/28/19 0824  vancomycin variable dose per unstable renal function (pharmacist dosing)  Status:  Discontinued      Does not apply See admin instructions 03/28/19 0825 03/29/19 0958   03/28/19 0800  ceFEPIme (MAXIPIME) 2 g in sodium chloride 0.9 % 100 mL IVPB  Status:  Discontinued     2 g 200 mL/hr over 30 Minutes  Intravenous Every 12 hours 04/02/2019 2054 03/28/19 0817   04/12/2019 1930  ceFEPIme (MAXIPIME) 2 g in sodium chloride 0.9 % 100 mL IVPB     2 g 200 mL/hr over 30 Minutes Intravenous  Once 04/02/2019 1921 03/24/2019 2055   04/08/2019 1930  metroNIDAZOLE (FLAGYL) IVPB 500 mg     500 mg 100 mL/hr over 60 Minutes Intravenous  Once 04/07/2019 1921 04/12/2019 2159   04/15/2019 1930  vancomycin (VANCOCIN) IVPB 1000 mg/200 mL premix  Status:  Discontinued     1,000 mg 200 mL/hr over 60 Minutes Intravenous  Once 03/31/2019 1921 04/02/2019 1925   04/01/2019 1930  vancomycin (VANCOCIN) 1,750 mg in sodium chloride 0.9 % 500 mL IVPB     1,750 mg 250 mL/hr over 120 Minutes Intravenous  Once 03/23/2019 1925 04/05/2019 2303       Objective: Vitals:   04/10/19 1210 04/10/19 2107 04/11/19 0349 04/11/19 0436  BP: 130/62 (!) 126/57 (!) 129/58   Pulse: (!) 55 (!) 55 (!) 56   Resp:  18 18  Temp:  98 F (36.7 C) 97.9 F (36.6 C)   TempSrc:  Oral Oral   SpO2:  95% 92%   Weight:    107.6 kg  Height:        Intake/Output Summary (Last 24 hours) at 04/11/2019 1022 Last data filed at 04/11/2019 0900 Gross per 24 hour  Intake 2154.57 ml  Output 1850 ml  Net 304.57 ml   Filed Weights   04/09/19 0441 04/10/19 0613 04/11/19 0436  Weight: 102.2 kg 108 kg 107.6 kg    Examination: General exam: Appears calm and comfortable  Respiratory system: Clear to auscultation anteriorly. Respiratory effort normal.  On nasal cannula O2 Cardiovascular system: S1 & S2 heard, RRR. + Bilateral pitting pedal edema. Gastrointestinal system: Abdomen is nondistended, soft and tender to palpation left lower quadrant. Normal bowel sounds heard. Central nervous system: Alert and oriented. Non focal exam. Speech clear  Extremities: Symmetric in appearance bilaterally  Skin: No rashes, lesions or ulcers on exposed skin  Psychiatry: Judgement and insight appear stable. Mood & affect appropriate.    Data Reviewed: I have personally reviewed  following labs and imaging studies  CBC: Recent Labs  Lab 04/06/19 1710  04/07/19 0408  04/07/19 2143 04/08/19 0332 04/09/19 0500 04/10/19 0435 04/11/19 0347  WBC 16.6*  --  23.7*  --   --  21.2* 18.2* 15.1* 14.5*  NEUTROABS 14.6*  --   --   --   --   --   --   --   --   HGB 11.0*   < > 9.8*   < > 8.2* 7.8* 7.2* 7.1* 7.4*  HCT 32.4*   < > 28.5*   < > 24.4* 24.5* 23.2* 23.8* 24.5*  MCV 87.3  --  85.8  --   --  90.7 95.9 97.9 98.4  PLT 127*  --  133*  --   --  128* 124* 135* 139*   < > = values in this interval not displayed.   Basic Metabolic Panel: Recent Labs  Lab 04/05/19 0601 04/06/19 0428  04/07/19 0408 04/08/19 0332 04/09/19 0500 04/09/19 0759 04/09/19 1304 04/10/19 0435 04/11/19 0347  NA 141 141   < > 138 138  --  138 139 139 139  K 3.3* 3.5   < > 5.0 4.8  --  5.8* 5.4* 5.5* 6.0*  CL 105 106   < > 105 106  --  104 104 105 103  CO2 25 25   < > 23 23  --  24 22 25 25   GLUCOSE 124* 173*   < > 171* 141*  --  152* 167* 147* 133*  BUN 25* 24*   < > 33* 34*  --  43* 44* 49* 56*  CREATININE 1.60* 1.82*   < > 2.31* 1.96*  --  2.31* 2.39* 2.57* 2.63*  CALCIUM 7.4* 6.9*   < > 6.8* 6.8*  --  6.8* 6.8* 7.0* 6.9*  MG 1.4* 1.4*  --   --   --  1.9  --   --   --   --   PHOS 2.0* 3.8  --  4.9* 4.0 4.5  --   --   --   --    < > = values in this interval not displayed.   GFR: Estimated Creatinine Clearance: 30.1 mL/min (A) (by C-G formula based on SCr of 2.63 mg/dL (H)). Liver Function Tests: Recent Labs  Lab 04/06/19 0428 04/06/19 1159 04/07/19 0408 04/08/19 0332 04/09/19 0500  AST  --  25  --  30 33  ALT  --  22  --  20 24  ALKPHOS  --  54  --  70 56  BILITOT  --  0.4  --  1.0 1.0  PROT  --  4.8*  --  5.2* 5.4*  ALBUMIN 2.9* 2.6* 2.7* 2.7* 2.9*   Recent Labs  Lab 04/06/19 1024  LIPASE 60*   No results for input(s): AMMONIA in the last 168 hours. Coagulation Profile: Recent Labs  Lab 04/06/19 1905 04/06/19 2316 04/07/19 0600 04/08/19 0332 04/09/19 0500    INR 1.1 1.2 1.1 1.2 1.2   Cardiac Enzymes: No results for input(s): CKTOTAL, CKMB, CKMBINDEX, TROPONINI in the last 168 hours. BNP (last 3 results) No results for input(s): PROBNP in the last 8760 hours. HbA1C: No results for input(s): HGBA1C in the last 72 hours. CBG: Recent Labs  Lab 04/10/19 0658 04/10/19 1103 04/10/19 1609 04/10/19 2107 04/11/19 0619  GLUCAP 129* 144* 135* 123* 118*   Lipid Profile: No results for input(s): CHOL, HDL, LDLCALC, TRIG, CHOLHDL, LDLDIRECT in the last 72 hours. Thyroid Function Tests: No results for input(s): TSH, T4TOTAL, FREET4, T3FREE, THYROIDAB in the last 72 hours. Anemia Panel: No results for input(s): VITAMINB12, FOLATE, FERRITIN, TIBC, IRON, RETICCTPCT in the last 72 hours. Sepsis Labs: Recent Labs  Lab 04/09/19 3614  LATICACIDVEN 1.6    No results found for this or any previous visit (from the past 240 hour(s)).    Radiology Studies: US Renal Transplant W/doppler  Result Date: 04/09/2019 CLINICAL DATA:  Acute kidney injury. LEFT transplant kidney in March 1998. EXAM: ULTRASOUND OF RENAL TRANSPLANT WITH RENAL DOPPLER ULTRASOUND TECHNIQUE: Ultrasound examination of the renal transplant was performed with gray-scale, color and duplex doppler evaluation. COMPARISON:  CT of the abdomen and pelvis on 04/06/2019 FINDINGS: Transplant kidney location: LEFT iliac fossa Transplant Kidney: Renal measurements: 12.7 x 6.5 x 5.2 centimeter = volume: 222.65m. Normal in size and parenchymal echogenicity. No evidence of mass or hydronephrosis. No peri-transplant fluid collection seen. Color flow in the main renal artery:  Present Color flow in the main renal vein:  Present Duplex Doppler Evaluation: Main Renal Artery Resistive Index: 0.96 Venous waveform in main renal vein:  Present Intrarenal resistive index in upper pole:  0.84 (normal 0.6-0.8; equivocal 0.8-0.9; abnormal >= 0.9) Intrarenal resistive index in lower pole: 0.90 (normal 0.6-0.8;  equivocal 0.8-0.9; abnormal >= 0.9) Bladder: Normal for degree of bladder distention. Other findings:  None. IMPRESSION: 1. Normal ultrasound appearance of the transplant kidney in the LEFT iliac fossa. 2. No hydronephrosis. 3. Resistive indices are equivocal. Electronically Signed   By: ENolon NationsM.D.   On: 04/09/2019 19:04   Ir Fluoro Guide Cv Line Right  Result Date: 04/10/2019 INDICATION: Endocarditis, access for long-term antibiotics EXAM: TUNNELED PICC LINE WITH ULTRASOUND AND FLUOROSCOPIC GUIDANCE MEDICATIONS: 1% lidocaine local. The antibiotic was given in an appropriate time interval prior to skin puncture. ANESTHESIA/SEDATION: Moderate Sedation Time:  None. The patient was continuously monitored during the procedure by the interventional radiology nurse under my direct supervision. FLUOROSCOPY TIME:  Fluoroscopy Time: 0 minutes 12 seconds (1 mGy). COMPLICATIONS: None immediate. PROCEDURE: Informed written consent was obtained from the patient after a discussion of the risks, benefits, and alternatives to treatment. Questions regarding the procedure were encouraged and answered. The right neck and chest were prepped with chlorhexidine in a sterile fashion, and a sterile drape was applied covering the operative field. Maximum barrier sterile technique with  sterile gowns and gloves were used for the procedure. A timeout was performed prior to the initiation of the procedure. After creating a small venotomy incision, a micropuncture kit was utilized to access the right internal jugular vein under direct, real-time ultrasound guidance after the overlying soft tissues were anesthetized with 1% lidocaine with epinephrine. Ultrasound image documentation was performed. The microwire was kinked to measure appropriate catheter length. The micropuncture sheath was exchanged for a peel-away sheath over a guidewire. A 5 French dual lumen tunneled PICC measuring 27 cm was tunneled in a retrograde fashion from  the anterior chest wall to the venotomy incision. The catheter was then placed through the peel-away sheath with tip ultimately positioned at the superior caval-atrial junction. Final catheter positioning was confirmed and documented with a spot radiographic image. The catheter aspirates and flushes normally. The catheter was flushed with appropriate volume heparin dwells. The catheter exit site was secured with a 0-Prolene retention suture. The venotomy incision was closed with Dermabond. Dressings were applied. The patient tolerated the procedure well without immediate post procedural complication. FINDINGS: After catheter placement, the tip lies within the superior cavoatrial junction. The catheter aspirates and flushes normally and is ready for immediate use. IMPRESSION: Successful placement of 27cm dual lumen tunneled PICC catheter via the right internal jugular vein with tip terminating at the superior caval atrial junction. The catheter is ready for immediate use. Electronically Signed   By: Jerilynn Mages.  Shick M.D.   On: 04/10/2019 08:04   Ir US Guide Vasc Access Right  Result Date: 04/10/2019 INDICATION: Endocarditis, access for long-term antibiotics EXAM: TUNNELED PICC LINE WITH ULTRASOUND AND FLUOROSCOPIC GUIDANCE MEDICATIONS: 1% lidocaine local. The antibiotic was given in an appropriate time interval prior to skin puncture. ANESTHESIA/SEDATION: Moderate Sedation Time:  None. The patient was continuously monitored during the procedure by the interventional radiology nurse under my direct supervision. FLUOROSCOPY TIME:  Fluoroscopy Time: 0 minutes 12 seconds (1 mGy). COMPLICATIONS: None immediate. PROCEDURE: Informed written consent was obtained from the patient after a discussion of the risks, benefits, and alternatives to treatment. Questions regarding the procedure were encouraged and answered. The right neck and chest were prepped with chlorhexidine in a sterile fashion, and a sterile drape was applied  covering the operative field. Maximum barrier sterile technique with sterile gowns and gloves were used for the procedure. A timeout was performed prior to the initiation of the procedure. After creating a small venotomy incision, a micropuncture kit was utilized to access the right internal jugular vein under direct, real-time ultrasound guidance after the overlying soft tissues were anesthetized with 1% lidocaine with epinephrine. Ultrasound image documentation was performed. The microwire was kinked to measure appropriate catheter length. The micropuncture sheath was exchanged for a peel-away sheath over a guidewire. A 5 French dual lumen tunneled PICC measuring 27 cm was tunneled in a retrograde fashion from the anterior chest wall to the venotomy incision. The catheter was then placed through the peel-away sheath with tip ultimately positioned at the superior caval-atrial junction. Final catheter positioning was confirmed and documented with a spot radiographic image. The catheter aspirates and flushes normally. The catheter was flushed with appropriate volume heparin dwells. The catheter exit site was secured with a 0-Prolene retention suture. The venotomy incision was closed with Dermabond. Dressings were applied. The patient tolerated the procedure well without immediate post procedural complication. FINDINGS: After catheter placement, the tip lies within the superior cavoatrial junction. The catheter aspirates and flushes normally and is ready for immediate use. IMPRESSION:  Successful placement of 27cm dual lumen tunneled PICC catheter via the right internal jugular vein with tip terminating at the superior caval atrial junction. The catheter is ready for immediate use. Electronically Signed   By: Jerilynn Mages.  Shick M.D.   On: 04/10/2019 08:04      Scheduled Meds:  aspirin EC  81 mg Oral Daily   atorvastatin  80 mg Oral Daily   calcitRIOL  0.5 mcg Oral Once per day on Tue Thu Sat   And   calcitRIOL  1  mcg Oral Once per day on Sun Mon Wed Fri   Chlorhexidine Gluconate Cloth  6 each Topical Daily   influenza vaccine adjuvanted  0.5 mL Intramuscular Tomorrow-1000   insulin aspart  0-9 Units Subcutaneous TID WC   mouth rinse  15 mL Mouth Rinse BID   mycophenolate  1,000 mg Oral BID   pantoprazole  40 mg Oral Daily   pneumococcal 23 valent vaccine  0.5 mL Intramuscular Tomorrow-1000   sodium chloride flush  10-40 mL Intracatheter Q12H   tamsulosin  0.4 mg Oral Daily   Continuous Infusions:  sodium chloride 10 mL/hr at 04/11/19 0602   ceFTAROline (TEFLARO) IV Stopped (04/11/19 0558)     LOS: 15 days      Time spent: 25 minutes   Dessa Phi, DO [facility] 04/11/2019, 10:22 AM   Available via Epic secure chat 7am-7pm After these hours, please refer to coverage provider listed on [email or web address]

## 2019-04-11 NOTE — Plan of Care (Signed)

## 2019-04-11 NOTE — Progress Notes (Signed)
[facility]    NEPHROLOGY PROGRESS NOTE  SUBJECTIVE: Patient status post PEA arrest.  Was going to the bathroom and when he came back, he developed agonal breathing.  The patient is now intubated and awake in the ICU.  OBJECTIVE:  Vitals:   04/11/19 1420 04/11/19 1425  BP: 109/82 122/62  Pulse: (!) 48 (!) 47  Resp:    Temp:    SpO2: 100% 100%    Intake/Output Summary (Last 24 hours) at 04/11/2019 1442 Last data filed at 04/11/2019 1100 Gross per 24 hour  Intake 2024.57 ml  Output 1750 ml  Net 274.57 ml      General: Awake NAD, no acute complaints HEENT: MMM [place] AT anicteric sclera, orally intubated Neck:  No JVD, no adenopathy CV:  Heart RRR  Lungs:  L/S CTA bilaterally Abd:  abd marked distention with normal BS GU:  Bladder non-palpable Extremities: +2 bilateral lower extremity edema Skin:  No skin rash  MEDICATIONS:  . aspirin EC  81 mg Oral Daily  . atorvastatin  80 mg Oral Daily  . calcitRIOL  0.5 mcg Oral Once per day on Tue Thu Sat   And  . calcitRIOL  1 mcg Oral Once per day on Sun Mon Wed Fri  . Chlorhexidine Gluconate Cloth  6 each Topical Daily  . influenza vaccine adjuvanted  0.5 mL Intramuscular Tomorrow-1000  . insulin aspart  0-9 Units Subcutaneous TID WC  . mouth rinse  15 mL Mouth Rinse BID  . midazolam      . mycophenolate  1,000 mg Oral BID  . pantoprazole  40 mg Oral Daily  . pneumococcal 23 valent vaccine  0.5 mL Intramuscular Tomorrow-1000  . sodium chloride flush  10-40 mL Intracatheter Q12H  . tamsulosin  0.4 mg Oral Daily       LABS:   CBC Latest Ref Rng & Units 04/11/2019 04/11/2019 04/10/2019  WBC 4.0 - 10.5 K/uL 16.1(H) 14.5(H) 15.1(H)  Hemoglobin 13.0 - 17.0 g/dL 7.6(L) 7.4(L) 7.1(L)  Hematocrit 39.0 - 52.0 % 26.0(L) 24.5(L) 23.8(L)  Platelets 150 - 400 K/uL 146(L) 139(L) 135(L)    CMP Latest Ref Rng & Units 04/11/2019 04/11/2019 04/10/2019  Glucose 70 - 99 mg/dL 286(H) 133(H) 147(H)  BUN 8 - 23 mg/dL 59(H)  56(H) 49(H)  Creatinine 0.61 - 1.24 mg/dL 2.76(H) 2.63(H) 2.57(H)  Sodium 135 - 145 mmol/L 140 139 139  Potassium 3.5 - 5.1 mmol/L 4.9 6.0(H) 5.5(H)  Chloride 98 - 111 mmol/L 103 103 105  CO2 22 - 32 mmol/L 22 25 25   Calcium 8.9 - 10.3 mg/dL 6.4(LL) 6.9(L) 7.0(L)  Total Protein 6.5 - 8.1 g/dL 5.2(L) - -  Total Bilirubin 0.3 - 1.2 mg/dL 1.6(H) - -  Alkaline Phos 38 - 126 U/L 67 - -  AST 15 - 41 U/L 180(H) - -  ALT 0 - 44 U/L 154(H) - -    Lab Results  Component Value Date   CALCIUM 6.4 (LL) 04/11/2019   PHOS 4.5 04/09/2019       Component Value Date/Time   COLORURINE AMBER (A) 04/10/2019 1600   APPEARANCEUR CLEAR 04/10/2019 1600   LABSPEC 1.019 04/10/2019 1600   PHURINE 5.0 04/10/2019 1600   GLUCOSEU NEGATIVE 04/10/2019 1600   HGBUR NEGATIVE 04/10/2019 1600   BILIRUBINUR NEGATIVE 04/10/2019 1600   BILIRUBINUR Negative 11/17/2013   KETONESUR NEGATIVE 04/10/2019 1600   PROTEINUR 30 (A) 04/10/2019 1600   UROBILINOGEN 0.2 12/04/2008 1835   NITRITE NEGATIVE 04/10/2019 1600   LEUKOCYTESUR  NEGATIVE 04/10/2019 1600   No results found for: PHART, PCO2ART, PO2ART, HCO3, TCO2, ACIDBASEDEF, O2SAT     Component Value Date/Time   FERRITIN 293 04/13/2019 1912       ASSESSMENT/PLAN:    1. AKI/CKD stage 3 presumably due to ischemic ATN in setting of volume depletion and hypotension.  1.  Recent renal transplant ultrasound with no evidence of obstruction or increased resistive indices. 2. Serum creatinine worse despite diuresis.  We will hold off on further diuresis for now. 2. Sepsis with hypotension- blood cultures negative to date, TTE with oscillating density in the mitral valve and awaiting TEE negative for vegetation. 1. Status post dapto and ceftriaxone per ID. 3. Diarrhea- resolved. 4. Hyperkalemia.  Likely due to resorption of blood in retroperitoneum.  S/p insulin, D50, and Lokelma.  Post cardiac arrest potassium within normal limits.  Will watch closely.  May need dialysis if  potassium increases 5. Metabolic acidosis- improved.  Off sodium bicarbonate. 6. Hematuria-Foley moved  7. Renal transplant from sister only taking cellcept.changed to myfortic due to diarrhea but changed back to cellcept which is what he has been on for years. 8. Hypocalcemia.  Check vitamin D level and PTH.  We will add calcium carbonate. 9. Status post cardiac arrest.  Question respiratory event.  Monitor potassium closely. 10. Disposition- when creat stable, can follow up with Dr. Jimmy Footman above. Gaylene Brooks, DO, FACP

## 2019-04-11 NOTE — Code Documentation (Cosign Needed)
  Patient Name: John Barton   MRN: [id number]   Date of Birth/ Sex: [birth year] , male      Admission Date: 03/26/2019  Attending Provider: Brand Males, MD  Primary Diagnosis: Endocarditis   Indication: Pt was in his usual state of health until this PM, when he was noted to be PEA arrest. Code blue was subsequently called. At the time of arrival on scene, ACLS protocol was underway.   Technical Description:  - CPR performance duration:  Roughly 10  minute  - Was defibrillation or cardioversion used? No   - Was external pacer placed? No  - Was patient intubated pre/post CPR? Yes   Medications Administered: Y = Yes; Blank = No Amiodarone    Atropine    Calcium  Y  Epinephrine  Y  Lidocaine    Magnesium    Norepinephrine    Phenylephrine    Sodium bicarbonate  Y  Vasopressin     Post CPR evaluation:  - Final Status - Was patient successfully resuscitated ? Yes - What is current rhythm? Atrial fibrillation  - What is current hemodynamic status? intact  Miscellaneous Information:  - Labs sent, including: BMP, Lactic Acid, Mg, CXR, EKG  - Primary team notified?  Yes  - Family Notified? No  - Additional notes/ transfer status: none     Marianna Payment, MD  04/11/2019, 8:47 PM

## 2019-04-11 NOTE — Progress Notes (Signed)
[facility] Progress Note Patient Name: John Barton DOB: 1945-11-05 MRN: [id number]   Date of Service  04/11/2019  HPI/Events of Note  Episodes of bradycardia with HR down into high 30's. Now off of a Propofol IV infusion. Frequent PVC's. BP = 153/60 and MAP = 71.  eICU Interventions  Will order: 1. Scopolamine patch 1.5 mg Q 72 hours. 2. BMP and Mg++ level now.      Intervention Category Major Interventions: Arrhythmia - evaluation and management  Tatiyana Foucher Cornelia Copa 04/11/2019, 9:38 PM

## 2019-04-11 NOTE — Anesthesia Procedure Notes (Signed)
Procedure Name: Intubation Date/Time: 04/11/2019 1:04 PM Performed by: Renato Shin, CRNA Pre-anesthesia Checklist: Patient identified, Emergency Drugs available, Suction available and Patient being monitored Patient Re-evaluated:Patient Re-evaluated prior to induction Oxygen Delivery Method: Circle system utilized Preoxygenation: Pre-oxygenation with 100% oxygen Ventilation: Mask ventilation without difficulty Laryngoscope Size: Miller and 3 Grade View: Grade I Tube type: Oral Tube size: 7.5 mm Number of attempts: 1 Airway Equipment and Method: Stylet and Oral airway Placement Confirmation: ETT inserted through vocal cords under direct vision,  positive ETCO2,  breath sounds checked- equal and bilateral and CO2 detector Secured at: 23 cm Tube secured with: Tape Dental Injury: Teeth and Oropharynx as per pre-operative assessment

## 2019-04-11 NOTE — Progress Notes (Addendum)
NAME:  John Barton, MRN:  [id number], DOB:  07-Dec-1945, LOS: 90 ADMISSION DATE:  04/18/2019, CONSULTATION DATE: 04/06/19 REFERRING MD:  Dr. 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    Admitted on 10/10, febrile, getting treated for presumed endocarditis.   Cultures negative, TEE eventually was able to be done and was negative for vegetations.    On coumadin at home, was supratheraputic, coumadin reversed, now was getting bridged back to ac with coumadin, had been on heparin infusion.   Developed hypotension with drop in H/H, CT abd done, large RP hematoma, heterogenous.    Transferred out of ICU 10/21   04/08/2019  - Presumed endocarditis -since he is high risk, ID is recommending long-term ceftaroline with end date 05/15/2019. He is s/p 4 units PRBC. Off pressors. Not on vent. No delirium. Has pain and gets prn fentanyl - Pulse ox 100% on 2L [place]. Per RN has tendency to drop pulse ox when he gets fentanyl 88mcg prn. He refuses percocet. This is for flank pain. He refuses percocet. Not on anticoagulation yet. hgb 7.8 (was 8.2 yesterday)    10/24: In Hospital cardiac arrest. Patient was sitting in chair, nurse went to assist back to bed. After standing, patient with agonal respirations and then became unresponsive. CODE BLUE was called. He received 2 amps calcium gluconate, 1 amp bicarb and 2 amps epi.  Intubated in the unit.     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 Reversed with protamine, also given Kcentra and vitamin K    10/24 -: In-Hospital cardiac arrest 10/24: post arrest following commands. Intubated.   Consults:  ID Nephrology Cardiology  Procedures:  10/19 Cordis RIJ 10/19 R radial art line  Significant Diagnostic Tests:   TEE 03/25/2019 1. No vegetation seen  2. Left ventricular ejection fraction, by visual estimation, is 45 to 50%. The left ventricle has mildly decreased function. There is mildly increased left ventricular hypertrophy.  3. Global right ventricle has normal systolic function.The right ventricular size is normal.  4. Severe mitral annular calcification.  5. The mitral valve is degenerative. Mild to moderate mitral valve regurgitation.  6. The tricuspid valve is normal in structure. Tricuspid valve regurgitation is mild.  7. Mechanical prosthesis in the aortic valve position. No paravalvular leak  8. The pulmonic valve was not well visualized. Pulmonic valve regurgitation is not visualized by color flow Doppler.  Micro Data:  10/9 Bl cx neg  Antimicrobials:   Ceftriaxone 10/14 -  current Daptomycin 10/13 --> current  Cefepime 10/9-10/13  Interim history/subjective:  Transfer to ICU today after in house PEA arrest of unclear etiology. Ongoing evaluation in progress   Objective   Blood pressure 126/67, pulse 92, temperature 97.9 F (36.6 C), temperature source Oral, resp. rate 18, height 5\' 10"  (1.778 m), weight 107.6 kg, SpO2 100 %.    Vent Mode: PRVC FiO2 (%):  [100 %] 100 %  Set Rate:  [18 bmp] 18 bmp Vt Set:  [580 mL] 580 mL PEEP:  [8 cmH20] 8 cmH20 Plateau Pressure:  [25 cmH20] 25 cmH20   Intake/Output Summary (Last 24 hours) at 04/11/2019 1410 Last data filed at 04/11/2019 1100 Gross per 24 hour  Intake 2144.57 ml  Output 2050 ml  Net 94.57 ml   Filed Weights   04/09/19 0441 04/10/19 0613 04/11/19 0436  Weight: 102.2 kg 108 kg 107.6 kg     General Appearance: Intubated,  Head:  AT/[place] Eyes:  PERRL   Lungs: Diminished breath sounds throughout Heart:  Bradycardia, RBBB on EKG Abdomen:  Soft, non tender, distended Extremities:  Extremities- intact Skin:  No rashes Neurologic: Eyes open to voice, follows commands in all extremities.    Resolved Hospital Problem list    Assessment & Plan:  In-house cardiac arrest -Unclear etiology -Hgb stable -EKG: No evidence of ischemia (RBBB), serial troponin.  -CXR: Pulmonary edema -Description of events concerning for orthostatic hypotension/vagal episode. Hemodynamics stable.  Plan: -Repeat labs pending. K elevated this am, but did receive temporizing measures -Following commands in all extremities with no focal deficits, will not pursue TTM -Ddx includes PE, not on DVT ppx in setting of RP bleed. Obtain ECHO to eval RV.   Acute hypoxic respiratory failure -Intubated 10/24 -CXR: pulmonary edema Plan -Pending stabilization, +7L this admission.  -Continue full vent support with lung protective strategies.  AKI, s/p kidney transplant.  Solitary Kidney CKD stage IIIb -baseline cr: 1.3 -Nephrology following Plan: -Cellcept   Abdominal distention Plan:  -KUB -NGT to LIWS  Hyperkalemia -s/p medical management with temporization measures Plan: -d/w Nephrology, pending BMP results to eval if indication to proceed with HD  Type 2 diabetes mellitus - A1c 6.6 in 2018 Plan: -SSI:  Mechanical aortic valve, prosthetic  -Systemic a/c on hold in setting of recent RP.  Plan was for resumption  of a/c today. Hold for now until labs returned.   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 Plan -Asa/statin -Repeat ECHO.   Hypotension Sinus bradycardia  - s/p loop recorder Plan -Hold propofol and transition to Fentanyl with PRN Versed -ECHO  BPH  Plan  - Hold Flomax.  Hypocalcemia - Replaced  Presumed endocarditis  -since he is high risk, ID recommended long-term ceftaroline with end date 05/15/2019  Best practice:  Diet: NPO Pain/Anxiety/Delirium protocol (if indicated):Fentanyl drip with PRN versed VAP protocol (if indicated): Yes DVT prophylaxis: SCDs.  GI prophylaxis: PPI Glucose control: CBG Q 4, Mobility: BR  Code Status: Full Family Communication: Daughter at bedside and updated  CCT: 70 min  LABS    PULMONARY No results for input(s): PHART, PCO2ART, PO2ART, HCO3, TCO2, O2SAT in the last 168 hours.  Invalid input(s): PCO2, PO2  CBC Recent Labs  Lab 04/10/19 0435 04/11/19 0347 04/11/19 1322  HGB 7.1* 7.4* 7.6*  HCT 23.8* 24.5* 26.0*  WBC 15.1* 14.5* 16.1*  PLT 135* 139* 146*    COAGULATION Recent Labs  Lab 04/06/19 1905 04/06/19  2316 04/07/19 0600 04/08/19 0332 04/09/19 0500  INR 1.1 1.2 1.1 1.2 1.2    CARDIAC  No results for input(s): TROPONINI in the last 168 hours. No results for input(s): PROBNP in the last 168 hours.   CHEMISTRY Recent Labs  Lab 04/05/19 0601 04/06/19 0428  04/07/19 0408 04/08/19 0332 04/09/19 0500 04/09/19 0759 04/09/19 1304 04/10/19 0435 04/11/19 0347  NA 141 141   < > 138 138  --  138 139 139 139  K 3.3* 3.5   < > 5.0 4.8  --  5.8* 5.4* 5.5* 6.0*  CL 105 106   < > 105 106  --  104 104 105 103  CO2 25 25   < > 23 23  --  24 22 25 25   GLUCOSE 124* 173*   < > 171* 141*  --  152* 167* 147* 133*  BUN 25* 24*   < > 33* 34*  --  43* 44* 49* 56*  CREATININE 1.60* 1.82*   < > 2.31* 1.96*  --  2.31* 2.39* 2.57* 2.63*  CALCIUM 7.4* 6.9*    < > 6.8* 6.8*  --  6.8* 6.8* 7.0* 6.9*  MG 1.4* 1.4*  --   --   --  1.9  --   --   --   --   PHOS 2.0* 3.8  --  4.9* 4.0 4.5  --   --   --   --    < > = values in this interval not displayed.   Estimated Creatinine Clearance: 29.8 mL/min (A) (by C-G formula based on SCr of 2.63 mg/dL (H)).   LIVER Recent Labs  Lab 04/06/19 0428 04/06/19 1159 04/06/19 1905 04/06/19 2316 04/07/19 0408 04/07/19 0600 04/08/19 0332 04/09/19 0500  AST  --  25  --   --   --   --  30 33  ALT  --  22  --   --   --   --  20 24  ALKPHOS  --  54  --   --   --   --  70 56  BILITOT  --  0.4  --   --   --   --  1.0 1.0  PROT  --  4.8*  --   --   --   --  5.2* 5.4*  ALBUMIN 2.9* 2.6*  --   --  2.7*  --  2.7* 2.9*  INR 1.8*  --  1.1 1.2  --  1.1 1.2 1.2     INFECTIOUS Recent Labs  Lab 04/09/19 0613  LATICACIDVEN 1.6     ENDOCRINE CBG (last 3)  Recent Labs    04/11/19 0619 04/11/19 1135 04/11/19 1304  GLUCAP 118* 132* 146*      ATTESTATION & SIGNATURE   STAFF NOTE: I, Dr Ann Lions have personally reviewed patient's available data, including medical history, events of note, physical examination and test results as part of my evaluation. I have discussed with resident/NP and other care providers such as pharmacist, RN and RRT.  In addition,  I personally evaluated patient and elicited key findings of   S: Date of admit 03/30/2019 with LOS 15 for today 04/11/2019 : John Barton is  S.p cardiac arrest that sounds vasovagal and high K mediated. High K thought to be due to resorption of blood. CPR for > 20 min per [facility]. Post CPR in ICU - > normotensive and following commands. Had aline placed   Noted -  still off IV heparin in setting of mechanical heart valve - was supposed to have it restarted today  O:  Blood pressure (!) 167/144, pulse 63, temperature 97.9 F (36.6 C), temperature source Oral, resp. rate (!) 9, height 5\' 10"  (1.778 m), weight 107.6 kg, SpO2 100 %.   Distended abd  nontender but on fent gtt Follows some commands through fent gtt PRVC 100% fio2 CTA bilaterally      IMAGING x24h  - image(s) personally visualized  -   highlighted in bold Dg Abd 1 View  Result Date: 04/11/2019 CLINICAL DATA:  Enteric tube placement. EXAM: ABDOMEN - 1 VIEW COMPARISON:  Abdominal x-ray dated April 06, 2019. FINDINGS: Enteric tube looped in the stomach. New mild central small bowel dilatation. Air and stool within the colon. No acute osseous abnormality. No radio-opaque calculi or other significant radiographic abnormality are seen. IMPRESSION: 1. Enteric tube within the stomach. 2. New mild central small bowel dilatation may reflect ileus. Electronically Signed   By: Titus Dubin M.D.   On: 04/11/2019 15:02   Dg Chest Port 1 View  Result Date: 04/11/2019 CLINICAL DATA:  Unresponsive, EXAM: PORTABLE CHEST 1 VIEW COMPARISON:  04/06/2019 FINDINGS: Endotracheal tube with the tip 2.5 cm above the carina. Bilateral diffuse mild interstitial thickening. No focal consolidation, pleural effusion or pneumothorax. Stable cardiomegaly. Prior median sternotomy. Right jugular central venous catheter with the tip projecting over the cavoatrial junction. IMPRESSION: 1. Bilateral mild interstitial thickening most concerning for mild interstitial edema. 2. Support lines and tubing in satisfactory position. Electronically Signed   By: Kathreen Devoid   On: 04/11/2019 13:47      A: PEA arrest  0 could be PE but could be high K and vasovagal - Too high risk for getting CTa. Needs to be on IV heparin anyways for articfical heart valve  Seems to have escape anoxic brain injury and shock   P: Full vent supporrt Sedation with fent gtt Get CT without contrast and repeat BMET, CBC, lactate -> if no rib fractures and no evidence of active bleed - > start Iv heparin gtt   Anti-infectives (From admission, onward)   Start     Dose/Rate Route Frequency Ordered Stop   04/07/19 1630  ceftaroline  (TEFLARO) 400 mg in sodium chloride 0.9 % 250 mL IVPB     400 mg 250 mL/hr over 60 Minutes Intravenous Every 8 hours 04/07/19 1604     04/07/19 1400  ceftaroline (TEFLARO) 400 mg in sodium chloride 0.9 % 250 mL IVPB  Status:  Discontinued     400 mg 250 mL/hr over 60 Minutes Intravenous Every 8 hours 04/07/19 0936 04/07/19 1604   04/05/19 2000  DAPTOmycin (CUBICIN) 800 mg in sodium chloride 0.9 % IVPB  Status:  Discontinued     800 mg 232 mL/hr over 30 Minutes Intravenous Daily 04/05/19 0918 04/07/19 0936   04/01/19 1000  cefTRIAXone (ROCEPHIN) 2 g in sodium chloride 0.9 % 100 mL IVPB  Status:  Discontinued     2 g 200 mL/hr over 30 Minutes Intravenous Every 24 hours 03/31/19 1205 04/07/19 0936   03/31/19 2000  ceFEPIme (MAXIPIME) 1 g in sodium chloride 0.9 % 100 mL IVPB  Status:  Discontinued     1 g 200 mL/hr over 30 Minutes Intravenous Every 24 hours 03/31/19 0925 03/31/19 1205   03/31/19 1600  DAPTOmycin (CUBICIN) 800 mg in sodium chloride 0.9 % IVPB  Status:  Discontinued     800 mg 232 mL/hr  over 30 Minutes Intravenous Every 48 hours 03/31/19 1209 04/05/19 0919   03/28/19 2200  vancomycin (VANCOCIN) 1,250 mg in sodium chloride 0.9 % 250 mL IVPB  Status:  Discontinued     1,250 mg 166.7 mL/hr over 90 Minutes Intravenous Every 24 hours 03/26/2019 2054 03/28/19 0824   03/28/19 2000  ceFEPIme (MAXIPIME) 2 g in sodium chloride 0.9 % 100 mL IVPB  Status:  Discontinued     2 g 200 mL/hr over 30 Minutes Intravenous Every 24 hours 03/28/19 0817 03/31/19 0925   03/28/19 0824  vancomycin variable dose per unstable renal function (pharmacist dosing)  Status:  Discontinued      Does not apply See admin instructions 03/28/19 0825 03/29/19 0958   03/28/19 0800  ceFEPIme (MAXIPIME) 2 g in sodium chloride 0.9 % 100 mL IVPB  Status:  Discontinued     2 g 200 mL/hr over 30 Minutes Intravenous Every 12 hours 04/09/2019 2054 03/28/19 0817   03/20/2019 1930  ceFEPIme (MAXIPIME) 2 g in sodium chloride 0.9 %  100 mL IVPB     2 g 200 mL/hr over 30 Minutes Intravenous  Once 04/08/2019 1921 03/22/2019 2055   04/13/2019 1930  metroNIDAZOLE (FLAGYL) IVPB 500 mg     500 mg 100 mL/hr over 60 Minutes Intravenous  Once 04/15/2019 1921 03/30/2019 2159   03/31/2019 1930  vancomycin (VANCOCIN) IVPB 1000 mg/200 mL premix  Status:  Discontinued     1,000 mg 200 mL/hr over 60 Minutes Intravenous  Once 04/13/2019 1921 04/09/2019 1925   04/02/2019 1930  vancomycin (VANCOCIN) 1,750 mg in sodium chloride 0.9 % 500 mL IVPB     1,750 mg 250 mL/hr over 120 Minutes Intravenous  Once 03/25/2019 1925 03/22/2019 2303       Rest per NP/medical resident whose note is outlined above and that I agree with  The patient is critically ill with multiple organ systems failure and requires high complexity decision making for assessment and support, frequent evaluation and titration of therapies, application of advanced monitoring technologies and extensive interpretation of multiple databases.   Critical Care Time devoted to patient care services described in this note is  30  Minutes. This time reflects time of care of this signee Dr Brand Males. This critical care time does not reflect procedure time, or teaching time or supervisory time of PA/NP/Med student/Med Resident etc but could involve care discussion time     Dr. Brand Males, M.D., [facility].C.P Pulmonary and Critical Care Medicine Staff Physician [facility] Pager: [phone], If no answer or between  15:00h - 7:00h: call [phone]  04/11/2019 4:38 PM

## 2019-04-11 NOTE — Progress Notes (Signed)
[facility] Progress Note Patient Name: John Barton DOB: June 10, 1946 MRN: [id number]   Date of Service  04/11/2019  HPI/Events of Note  Mg++ = 1.7, Ca++ = 6.8 which corrects to 7.84 (low) given albumin = 2.7 and Creatinine = 2.6.  eICU Interventions  Will replace Mg++ and Ca++.     Intervention Category Major Interventions: Electrolyte abnormality - evaluation and management  [name] 04/11/2019, 11:22 PM

## 2019-04-11 NOTE — Procedures (Signed)
Arterial Catheter Insertion Procedure Note [name] [id number] [birth year]  Procedure: Insertion of Arterial Catheter  Indications: Hypotension  Procedure Details Consent: Verbal, obtained from daughter Time Out: Verified patient identification, verified procedure, site/side was marked, verified correct patient position, special equipment/implants available, medications/allergies/relevent history reviewed, required imaging and test results available.  Time out: 1530  Maximum sterile technique was used including antiseptics, cap, gloves, gown, hand hygiene, mask and sheet. Skin prep: Chlorhexidine; local anesthetic administered 20 gauge catheter was inserted into right radial artery using the Seldinger technique. ULTRASOUND GUIDANCE USED: YES Evaluation Blood flow good; BP tracing good. Complications: No apparent complications.   [name] 04/11/2019

## 2019-04-11 NOTE — Progress Notes (Signed)
Patient was on the bedside commode, after returning back to the bed, he began to agonal breathing. Called rapid response after assessing for a pulse. Patient was pulseless, initiated code blue.

## 2019-04-11 NOTE — Progress Notes (Signed)
ANTICOAGULATION CONSULT NOTE - Follow Up Consult  Pharmacy Consult for Heparin Indication: Afib, mechanical AVR   Patient Measurements: Height: 5' 10.5" (179.1 cm) Weight: 237 lb 3.4 oz (107.6 kg) IBW/kg (Calculated) : 74.15 Heparin dosing weight 86 kg  Vital Signs: Temp: 97.9 F (36.6 C) (10/24 0349) Temp Source: Oral (10/24 0349) BP: 129/58 (10/24 0349) Pulse Rate: 56 (10/24 0349)  Labs: Recent Labs    04/09/19 0500  04/09/19 0759 04/09/19 1304 04/10/19 0435 04/11/19 0347  HGB 7.2*  --   --   --  7.1* 7.4*  HCT 23.2*  --   --   --  23.8* 24.5*  PLT 124*  --   --   --  135* 139*  LABPROT 14.7  --   --   --   --   --   INR 1.2  --   --   --   --   --   CREATININE  --    < > 2.31* 2.39* 2.57* 2.63*  TROPONINIHS 462*  --  431*  --   --   --    < > = values in this interval not displayed.    Estimated Creatinine Clearance: 30.1 mL/min (A) (by C-G formula based on SCr of 2.63 mg/dL (H)).  Assessment: 25 yom admitted 10/9 with INR of 2.4 on warfarin PTA (7 mg Tuesday and Thursday, 5 mg all other days per med rec). Patient has a mechanical aortic valve. Warfarin held on 10/13-10/15 and received 1x 5 mg dose of vitamin k on 10/15 so pt could get TEE.   Patient continued on IV heparin/warfarin until 10/18, when he had a RP bleed, and anticoagulation stopped and reversed.  Hgb now stable, pharmacy asked to resume IV heparin.  Goal of Therapy:  Heparin level lower end of therapeutic range (0.3-0.5) Monitor platelets by anticoagulation protocol: Yes  INR 2.5 to 3   Plan:  No bolus given recent bleeding. Start heparin at 1200 units/hr Check heparin level in 8 hrs Daily heparin level and CBC. F/u plans to resume warfarin eventually.  Marguerite Olea, [facility] Clinical Pharmacist Phone [phone]  04/11/2019 11:03 AM

## 2019-04-11 NOTE — Progress Notes (Signed)
  PROGRESS NOTE  Responded to code blue. Code team at bedside. Patient was on the bedside commode, received IV insulin with dextrose (tx for hyperkalemia. He had received IV lasix and lokelma earlier in the morning at 9am). He was going back into bed when he became unresponsive, pulse unable to be palpated, PEA arrest. Blood sugar 146. Patient intubated, stabilized with ROSC. Transferred to ICU, spoke with Dr. Chase Caller. Also updated Dr. Andres Labrum.     I called and updated daughter on today's events. She is planning to come to the hospital this afternoon.   IV heparin that was ordered this morning had not been started yet. Will discontinue for now. Repeat labs pending. CXR pending read.   Total critical care time: 48 minutes. Time 12:56pm-1:44pm Critical care time was exclusive of separately billable procedures and treating other patients. Critical care was necessary to treat or prevent imminent or life-threatening deterioration. Critical care was time spent personally by me on the following activities: development of treatment plan with patient and/or surrogate as well as nursing, discussions with consultants, evaluation of patient's response to treatment, examination of patient, obtaining history from patient or surrogate, ordering and performing treatments and interventions, ordering and review of laboratory studies, ordering and review of radiographic studies, pulse oximetry and re-evaluation of patient's condition.    Dessa Phi, DO [facility] 04/11/2019, 1:20 PM  Available via Epic secure chat 7am-7pm After these hours, please refer to coverage provider listed on [email or web address]

## 2019-04-11 NOTE — Progress Notes (Signed)
*  PRELIMINARY RESULTS* Echocardiogram 2D Echocardiogram has been performed.  John Barton 04/11/2019, 3:38 PM

## 2019-04-12 ENCOUNTER — Encounter (HOSPITAL_COMMUNITY): Payer: Self-pay | Admitting: Internal Medicine

## 2019-04-12 ENCOUNTER — Inpatient Hospital Stay (HOSPITAL_COMMUNITY): Payer: Medicare Other

## 2019-04-12 DIAGNOSIS — R0789 Other chest pain: Secondary | ICD-10-CM

## 2019-04-12 DIAGNOSIS — J9600 Acute respiratory failure, unspecified whether with hypoxia or hypercapnia: Secondary | ICD-10-CM

## 2019-04-12 DIAGNOSIS — R001 Bradycardia, unspecified: Secondary | ICD-10-CM | POA: Diagnosis not present

## 2019-04-12 LAB — CBC
HCT: 25.2 % — ABNORMAL LOW (ref 39.0–52.0)
Hemoglobin: 7.9 g/dL — ABNORMAL LOW (ref 13.0–17.0)
MCH: 29.7 pg (ref 26.0–34.0)
MCHC: 31.3 g/dL (ref 30.0–36.0)
MCV: 94.7 fL (ref 80.0–100.0)
Platelets: 142 10*3/uL — ABNORMAL LOW (ref 150–400)
RBC: 2.66 MIL/uL — ABNORMAL LOW (ref 4.22–5.81)
RDW: 18 % — ABNORMAL HIGH (ref 11.5–15.5)
WBC: 16 10*3/uL — ABNORMAL HIGH (ref 4.0–10.5)
nRBC: 0.6 % — ABNORMAL HIGH (ref 0.0–0.2)

## 2019-04-12 LAB — GLUCOSE, CAPILLARY
Glucose-Capillary: 106 mg/dL — ABNORMAL HIGH (ref 70–99)
Glucose-Capillary: 112 mg/dL — ABNORMAL HIGH (ref 70–99)
Glucose-Capillary: 140 mg/dL — ABNORMAL HIGH (ref 70–99)
Glucose-Capillary: 112 mg/dL — ABNORMAL HIGH (ref 70–99)
Glucose-Capillary: 165 mg/dL — ABNORMAL HIGH (ref 70–99)

## 2019-04-12 LAB — BASIC METABOLIC PANEL
Anion gap: 13 (ref 5–15)
Anion gap: 13 (ref 5–15)
BUN: 60 mg/dL — ABNORMAL HIGH (ref 8–23)
BUN: 55 mg/dL — ABNORMAL HIGH (ref 8–23)
CO2: 25 mmol/L (ref 22–32)
CO2: 25 mmol/L (ref 22–32)
Calcium: 7.1 mg/dL — ABNORMAL LOW (ref 8.9–10.3)
Calcium: 7.4 mg/dL — ABNORMAL LOW (ref 8.9–10.3)
Chloride: 102 mmol/L (ref 98–111)
Chloride: 104 mmol/L (ref 98–111)
Creatinine, Ser: 2.61 mg/dL — ABNORMAL HIGH (ref 0.61–1.24)
Creatinine, Ser: 2.41 mg/dL — ABNORMAL HIGH (ref 0.61–1.24)
GFR calc Af Amer: 27 mL/min — ABNORMAL LOW (ref >60)
GFR calc Af Amer: 29 mL/min — ABNORMAL LOW (ref >60)
GFR calc non Af Amer: 23 mL/min — ABNORMAL LOW (ref >60)
GFR calc non Af Amer: 25 mL/min — ABNORMAL LOW (ref >60)
Glucose, Bld: 125 mg/dL — ABNORMAL HIGH (ref 70–99)
Glucose, Bld: 113 mg/dL — ABNORMAL HIGH (ref 70–99)
Potassium: 4.7 mmol/L (ref 3.5–5.1)
Potassium: 4.6 mmol/L (ref 3.5–5.1)
Sodium: 140 mmol/L (ref 135–145)
Sodium: 142 mmol/L (ref 135–145)

## 2019-04-12 LAB — TROPONIN I (HIGH SENSITIVITY)
Troponin I (High Sensitivity): 271 ng/L (ref <18)
Troponin I (High Sensitivity): 239 ng/L (ref <18)
Troponin I (High Sensitivity): 263 ng/L (ref <18)
Troponin I (High Sensitivity): 264 ng/L (ref <18)

## 2019-04-12 LAB — PROTIME-INR
INR: 1.3 — ABNORMAL HIGH (ref 0.8–1.2)
Prothrombin Time: 16 seconds — ABNORMAL HIGH (ref 11.4–15.2)

## 2019-04-12 LAB — PARATHYROID HORMONE, INTACT (NO CA): PTH: 16 pg/mL (ref 15–65)

## 2019-04-12 LAB — LACTIC ACID, PLASMA: Lactic Acid, Venous: 1.2 mmol/L (ref 0.5–1.9)

## 2019-04-12 LAB — TRIGLYCERIDES: Triglycerides: 120 mg/dL (ref <150)

## 2019-04-12 MED ORDER — CHLORHEXIDINE GLUCONATE 0.12 % MT SOLN
15.0000 mL | Freq: Two times a day (BID) | OROMUCOSAL | Status: DC
  Administered 2019-04-12 – 2019-04-14 (×4): 15 mL via OROMUCOSAL
  Filled 2019-04-12: qty 15

## 2019-04-12 MED ORDER — MYCOPHENOLATE 200 MG/ML ORAL SUSPENSION
1000.0000 mg | Freq: Two times a day (BID) | ORAL | Status: DC
  Filled 2019-04-12: qty 20

## 2019-04-12 MED ORDER — FENTANYL CITRATE (PF) 100 MCG/2ML IJ SOLN
25.0000 ug | INTRAMUSCULAR | Status: DC | PRN
  Administered 2019-04-12: 100 ug via INTRAVENOUS
  Administered 2019-04-12: 50 ug via INTRAVENOUS
  Filled 2019-04-12 (×2): qty 2

## 2019-04-12 MED ORDER — PANTOPRAZOLE SODIUM 40 MG PO PACK
40.0000 mg | PACK | Freq: Every day | ORAL | Status: DC
  Administered 2019-04-12: 40 mg
  Filled 2019-04-12: qty 20

## 2019-04-12 MED ORDER — ASPIRIN 81 MG PO CHEW
81.0000 mg | CHEWABLE_TABLET | Freq: Every day | ORAL | Status: DC
  Administered 2019-04-12 (×2): 81 mg
  Filled 2019-04-12: qty 1

## 2019-04-12 MED ORDER — MYCOPHENOLATE MOFETIL 500 MG PO TABS
1000.0000 mg | ORAL_TABLET | Freq: Two times a day (BID) | ORAL | Status: DC
  Administered 2019-04-12 – 2019-04-13 (×3): 1000 mg via ORAL
  Filled 2019-04-12 (×4): qty 2

## 2019-04-12 MED ORDER — MYCOPHENOLATE MOFETIL 250 MG PO CAPS
1000.0000 mg | ORAL_CAPSULE | Freq: Two times a day (BID) | ORAL | Status: DC
  Filled 2019-04-12: qty 4

## 2019-04-12 MED ORDER — FENTANYL CITRATE (PF) 100 MCG/2ML IJ SOLN
12.5000 ug | INTRAMUSCULAR | Status: DC | PRN
  Administered 2019-04-12 – 2019-04-13 (×5): 25 ug via INTRAVENOUS
  Filled 2019-04-12 (×6): qty 2

## 2019-04-12 MED ORDER — VITAMIN D (ERGOCALCIFEROL) 1.25 MG (50000 UNIT) PO CAPS
50000.0000 [IU] | ORAL_CAPSULE | ORAL | Status: DC
  Filled 2019-04-12: qty 1

## 2019-04-12 MED ORDER — ATORVASTATIN CALCIUM 80 MG PO TABS
80.0000 mg | ORAL_TABLET | Freq: Every day | ORAL | Status: DC
  Administered 2019-04-12: 13:00:00 80 mg
  Filled 2019-04-12: qty 1

## 2019-04-12 MED ORDER — ORAL CARE MOUTH RINSE: 15.0000 mL | Freq: Two times a day (BID) | OROMUCOSAL | Status: DC

## 2019-04-12 MED ORDER — CALCITRIOL 1 MCG/ML PO SOLN
0.5000 ug | ORAL | Status: DC
  Filled 2019-04-12 (×3): qty 0.5

## 2019-04-12 MED ORDER — DOPAMINE-DEXTROSE 3.2-5 MG/ML-% IV SOLN
0.0000 ug/kg/min | INTRAVENOUS | Status: DC
  Administered 2019-04-12 (×2): 1 ug/kg/min via INTRAVENOUS
  Administered 2019-04-12: 5 ug/kg/min via INTRAVENOUS
  Filled 2019-04-12 (×2): qty 250

## 2019-04-12 MED ORDER — MIDAZOLAM HCL 2 MG/2ML IJ SOLN: 1.0000 mg | INTRAMUSCULAR | Status: DC | PRN

## 2019-04-12 MED ORDER — CALCITRIOL 1 MCG/ML PO SOLN: 0.5000 ug | ORAL | Status: DC

## 2019-04-12 MED FILL — Medication: Qty: 1 | Status: AC

## 2019-04-12 NOTE — Progress Notes (Signed)
250 mL Fentanyl wasted in stericycle with Audry Riles RN.

## 2019-04-12 NOTE — Consult Note (Addendum)
CONSULTATION NOTE   Patient Name: John Barton Date of Encounter: 04/12/2019 Cardiologist: No primary care provider on file.  Chief Complaint   Consult for cardiac arrest, bradycardia  Patient Profile   73 year old male with history of renal transplant, hypertension, coronary artery disease who presented with fever, chills and tremors and was thought to have low-grade sepsis as well as possible UTI and pneumonia.  Additionally was felt to possibly have an endocarditis.  We are now consulted after cardiac arrest for additional cardiac recommendations. He was additionally noted to have bradycardia, requiring dopamine overnight.  HPI   John Barton is a 73 y.o. male who is being seen today for the evaluation of cardiac arrest at the request of Dr. Chase Caller.  This is a 73 year old male with a history of renal transplant, hypertension, coronary artery disease, who presented with infective symptoms and low-grade sepsis and ultimately was thought to have an endocarditis. INR was supratherapeutic on admission and he was found to have a large RP bleed. Anticoagulation was held and he was given protamine reversal and multiple blood product transfusions. He was not seen by cardiology however did have a TEE on April 03, 2019 for bacteremia and presumed endocarditis by Dr. Gardiner Rhyme.  This did not show any vegetations and there was a mechanical aortic valve prosthesis which was functioning normally.  LVEF at the time was 45 to 50%.  He seemed to be progressing with regards to improvement from sepsis however developed a PEA arrest yesterday. Apparently he got up to use the restroom and when he returned his nurse found him agonally breathing. He had CPR, calcium, epinephrine and sodium bicarbonate and ultimately he was resuscitated with development of ? atrial fibrillation afterward.  An echo yesterday status post cardiac arrest showed stable LVEF 45 to 50% with normally functioning aortic valve  prosthesis.  The etiology of the cardiac arrest was thought to be possibly due to hyperkalemia.  The highest potassium I saw was 6.0 yesterday morning.  Creatinine is 2.63 at the time with a GFR of 26.  Potassium now is 4.6 with a magnesium of 1.7.  He also has history of COPD, prior CABG, type 2 diabetes, status post AICD and numerous other medical problems.  Today is also describing precordial chest pain. It also notable that he has had paroxysmal bradycardia overnight - he was placed on dopamine for this. Frequent PVC's and bigeminy were noted. It appears this is not a new issue - he had a loop recorder placed in the past, however, the battery ran out. In 2019, he wore a monitor for bradycardia and was noted to have a junctional rhythm with PVC's in bigeminy. Nothing else was done about it at that time. We were asked to evaluate him for chest pain and bradycardia.  Repeat EKG today personally reviewed shows no new ischemia.  PMHx   Past Medical History:  Diagnosis Date   Arthritis    Bifascicular block    First degree AV block, right bundle branch block left anterior fascicular block.  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 Jimmy Footman.  GFR stable at 55 ml/min (Cr 1.28) 07/16/17.  Neph f/u 08/08/17, Cr up to 1.71,  GFR 39 (in context of vol ovld & imp bladder emptying b/c out of flomax x 1 wk).  Cr  1.38 and GFR 50 ml/min as of 08/21/17 renal f/u.  Cr 1.27, GFR 56 on renal f/u 10/01/17. Cr 1.24, GFR 57 on 01/06/18. Cr 1.41, GFR 49 Jan 2020. Stab GFR 09/2018    COPD (chronic obstructive pulmonary disease) ([place])    Coronary artery disease    CABG 1995.  Prior inferior MI noted on Myoview 2010   Diabetes  mellitus without complication ([place]) 88/1103   New dx 10/15/16 by A1c criteria (6.6%)--recommended nutritionist referral/no meds at initial dx.   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 + Hx of a-fib as well.  Coumadin mgmt --nephrology   History of renal transplant 1988   Hyperlipidemia    Hypertension    ICD (implantable cardioverter-defibrillator) in place    Normal device check 01/28/16.   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  //  Echo 2/18: mild LVH, EF 45-50, diff HK, Gr 1 DD, severe MAC, mild Mitral Stenosis, normally functioning mechanical AVR (mean 12, peak 25).  Per cards, repeat echo planned for 2020.   Secondary hyperparathyroidism ([place])    parathyroidectomy done; subsequently has hypoparathyroidism, takes vit D   Sinus bradycardia    Syncope    He is s/p loop recorder insertion; as of Dr. Olin Pia f/u 01/16/16 he has had no further syncope and no significant arrhythmias.  He elected to keep the loop recorder in and was told to f/u 1 yr with Dr. Caryl Comes.   NORMAL DEVICE REMOTE TRANSMISSION--NO ATRIAL FIBRILLATION--as of 09/02/15.    Past Surgical History:  Procedure Laterality Date   AORTIC VALVE REPLACEMENT  1995   ST.[facility]ude mechanical valve   CARDIAC CATHETERIZATION  1995   CARDIAC EVENT MONITOR  07/2017   PVCs, sinus brady---no significant abnormal findings.   CORONARY ARTERY BYPASS GRAFT  1995   RCA andLAD   ELECTROPHYSIOLOGY STUDY N/A 09/11/2012   Procedure: ELECTROPHYSIOLOGY STUDY;  Surgeon: Deboraha Sprang, MD;  Location: [facility] CATH LAB;  Service: Cardiovascular;  Laterality: N/A;   IR FLUORO GUIDE CV LINE RIGHT  04/09/2019   IR US GUIDE VASC ACCESS RIGHT  04/09/2019   KIDNEY  TRANSPLANT  09/10/1996   (born w/1 kidney).  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  2018: EF 45-50%, normal functioning prosthetic AV, diffuse hypokinesis, grd I DD, mild mitral stenosis.  10/2017: EF 40-45%, global hypokinesis, grd II DD, mild MR, severe LAE, mild RV dysfxn, mild inc pulm press.    FAMHx   Family History  Problem Relation Age of Onset   Heart failure Father    Heart disease Father    Hyperlipidemia Father    Hypertension Father    Diabetes Father    Arthritis Mother    Heart disease Mother    Hyperlipidemia Mother  Hypertension Mother    Heart failure Mother    Heart disease Brother    Diabetes Daughter    Alcohol abuse Brother    Liver disease Brother        Liver transplant, ETOH    SOCHx    reports that he quit smoking about 4 years ago. His smoking use included cigars. He has never used smokeless tobacco. He reports current alcohol use of about 14.0 standard drinks of alcohol per week. He reports that he does not use drugs.  Outpatient Medications   No current facility-administered medications on file prior to encounter.    Current Outpatient Medications on File Prior to Encounter  Medication Sig Dispense Refill   allopurinol (ZYLOPRIM) 100 MG tablet Take 100-200 mg by mouth 2 (two) times daily. 1 tablet by mouth in the morning 2 tablets by mouth at night     aspirin EC 81 MG tablet Take 1 tablet (81 mg total) by mouth daily. 90 tablet 3   atorvastatin (LIPITOR) 80 MG tablet Take 1 tablet (80 mg total) by mouth daily. 90 tablet 3    calcitRIOL (ROCALTROL) 0.5 MCG capsule Take 0.5 mcg by mouth daily.      furosemide (LASIX) 40 MG tablet Take 40 mg by mouth daily.     lisinopril (PRINIVIL,ZESTRIL) 5 MG tablet Take 5 mg by mouth daily.     Multiple Vitamins-Minerals (OCUVITE PO) Take 1 tablet by mouth 2 (two) times a day.      mycophenolate (CELLCEPT) 500 MG tablet Take 1,000 mg by mouth 2 (two) times daily.      Naproxen Sodium (ALEVE PO) Take 1 tablet by mouth daily as needed (pain).     tamsulosin (FLOMAX) 0.4 MG CAPS capsule Take 0.4 mg by mouth daily.      warfarin (COUMADIN) 2 MG tablet Take 7 mg by mouth See admin instructions. Tuesday & Thursday patient takes a  total dose of 7 mg. Patient takes with 5 mg to equal total dose of 7 mg.     warfarin (COUMADIN) 5 MG tablet Take 5 mg by mouth See admin instructions. Monday,Wednesday,Friday,Saturday & Sunday patient takes 5 mg.  On Tuesday and Thursday patient takes 7 mg.  10    Inpatient Medications    Scheduled Meds:  aspirin  81 mg Per Tube Daily   atorvastatin  80 mg Per Tube Daily   [START ON 04-29-19] calcitRIOL  0.5 mcg Per Tube Once per day on Tue Thu Sat   And   calcitRIOL  0.5 mcg Per Tube Once per day on Sun Mon Wed Fri   chlorhexidine gluconate (MEDLINE KIT)  15 mL Mouth Rinse BID   Chlorhexidine Gluconate Cloth  6 each Topical Daily   influenza vaccine adjuvanted  0.5 mL Intramuscular Tomorrow-1000   insulin aspart  0-9 Units Subcutaneous TID WC   mouth rinse  15 mL Mouth Rinse 10 times per day   mycophenolate  1,000 mg Per Tube BID   pantoprazole sodium  40 mg Per Tube Daily   pneumococcal 23 valent vaccine  0.5 mL Intramuscular Tomorrow-1000   sodium chloride flush  10-40 mL Intracatheter Q12H   sodium chloride flush  10-40 mL Intracatheter Q12H   Vitamin D (Ergocalciferol)  50,000 Units Oral Q Sun    Continuous Infusions:  sodium chloride Stopped (04/12/19 1343)   calcium gluconate     ceFTAROline (TEFLARO) IV 250  mL/hr at 04/12/19 1343    PRN Meds: sodium chloride, fentaNYL (SUBLIMAZE) injection,  ipratropium-albuterol, lidocaine (PF), ondansetron **OR** ondansetron (ZOFRAN) IV, sodium chloride flush   ALLERGIES   Allergies  Allergen Reactions   Vancomycin     Acute renal failure and h/o renal transplant. DO NOT GIVE!    ROS   Pertinent items noted in HPI and remainder of comprehensive ROS otherwise negative.  Vitals   Vitals:   04/12/19 1300 04/12/19 1400 04/12/19 1500 04/12/19 1557  BP:      Pulse: (!) 52 73    Resp:      Temp:   97.9 F (36.6 C)   TempSrc:   Axillary   SpO2: 96% 96%  92%  Weight:      Height:        Intake/Output Summary (Last 24 hours) at 04/12/2019 1624 Last data filed at 04/12/2019 1600 Gross per 24 hour  Intake 1899.85 ml  Output 1500 ml  Net 399.85 ml   Filed Weights   04/10/19 0613 04/11/19 0436 04/12/19 0500  Weight: 108 kg 107.6 kg 101.8 kg    Physical Exam   General appearance: alert, no distress and intubated Neck: no carotid bruit, no JVD and thyroid not enlarged, symmetric, no tenderness/mass/nodules Lungs: clear to auscultation bilaterally Heart: regular rate and rhythm and reproducible POP over the left sternal border Abdomen: soft, non-tender; bowel sounds normal; no masses,  no organomegaly and obese Extremities: extremities normal, atraumatic, no cyanosis or edema Pulses: 2+ and symmetric Skin: Skin color, texture, turgor normal. No rashes or lesions Neurologic: Mental status: Alert, oriented, thought content appropriate, follows commands, able to write sentences Psych: Cannot assess  Labs   Results for orders placed or performed during the hospital encounter of 04/18/2019 (from the past 48 hour(s))  Glucose, capillary     Status: Abnormal   Collection Time: 04/10/19  9:07 PM  Result Value Ref Range   Glucose-Capillary 123 (H) 70 - 99 mg/dL  CBC     Status: Abnormal   Collection Time: 04/11/19  3:47 AM  Result Value Ref  Range   WBC 14.5 (H) 4.0 - 10.5 K/uL   RBC 2.49 (L) 4.22 - 5.81 MIL/uL   Hemoglobin 7.4 (L) 13.0 - 17.0 g/dL   HCT 24.5 (L) 39.0 - 52.0 %   MCV 98.4 80.0 - 100.0 fL   MCH 29.7 26.0 - 34.0 pg   MCHC 30.2 30.0 - 36.0 g/dL   RDW 17.5 (H) 11.5 - 15.5 %   Platelets 139 (L) 150 - 400 K/uL    Comment: Immature Platelet Fraction may be clinically indicated, consider ordering this additional test [id number]    nRBC 0.4 (H) 0.0 - 0.2 %    Comment: Performed at [facility], [address] [place], [place] [address]  Basic metabolic panel     Status: Abnormal   Collection Time: 04/11/19  3:47 AM  Result Value Ref Range   Sodium 139 135 - 145 mmol/L   Potassium 6.0 (H) 3.5 - 5.1 mmol/L   Chloride 103 98 - 111 mmol/L   CO2 25 22 - 32 mmol/L   Glucose, Bld 133 (H) 70 - 99 mg/dL   BUN 56 (H) 8 - 23 mg/dL   Creatinine, Ser 2.63 (H) 0.61 - 1.24 mg/dL   Calcium 6.9 (L) 8.9 - 10.3 mg/dL   GFR calc non Af Amer 23 (L) >60 mL/min   GFR calc Af Amer 26 (L) >60 mL/min   Anion gap 11 5 - 15    Comment: Performed at [facility], [address], [place], [place] [address]  Glucose, capillary     Status: Abnormal   Collection Time: 04/11/19  6:19 AM  Result Value Ref Range   Glucose-Capillary 118 (H) 70 - 99 mg/dL  Glucose, capillary     Status: Abnormal   Collection Time: 04/11/19 11:35 AM  Result Value Ref Range   Glucose-Capillary 132 (H) 70 - 99 mg/dL   Comment 1 Notify RN    Comment 2 Document in Chart   Glucose, capillary     Status: Abnormal   Collection Time: 04/11/19  1:04 PM  Result Value Ref Range   Glucose-Capillary 146 (H) 70 - 99 mg/dL  Troponin I (High Sensitivity)     Status: Abnormal   Collection Time: 04/11/19  1:22 PM  Result Value Ref Range   Troponin I (High Sensitivity) 243 (HH) <18 ng/L    Comment: CRITICAL RESULT CALLED TO, READ BACK BY AND VERIFIED WITH: E.COLLERAN,RN @ 1430 04/11/2019 WEBBERJ (NOTE) Elevated high sensitivity troponin I (hsTnI) values and  significant  changes across serial measurements may suggest ACS but many other  chronic and acute conditions are known to elevate hsTnI results.  Refer to the Links section for chest pain algorithms and additional  guidance. Performed at [facility], [place] [address] [place], [place] [address]   Hepatic function panel     Status: Abnormal   Collection Time: 04/11/19  1:22 PM  Result Value Ref Range   Total Protein 5.2 (L) 6.5 - 8.1 g/dL   Albumin 2.7 (L) 3.5 - 5.0 g/dL   AST 180 (H) 15 - 41 U/L   ALT 154 (H) 0 - 44 U/L   Alkaline Phosphatase 67 38 - 126 U/L   Total Bilirubin 1.6 (H) 0.3 - 1.2 mg/dL   Bilirubin, Direct 0.5 (H) 0.0 - 0.2 mg/dL   Indirect Bilirubin 1.1 (H) 0.3 - 0.9 mg/dL    Comment: Performed at [facility] [address] [place], [place] [address]  CBC     Status: Abnormal   Collection Time: 04/11/19  1:22 PM  Result Value Ref Range   WBC 16.1 (H) 4.0 - 10.5 K/uL   RBC 2.54 (L) 4.22 - 5.81 MIL/uL   Hemoglobin 7.6 (L) 13.0 - 17.0 g/dL   HCT 26.0 (L) 39.0 - 52.0 %   MCV 102.4 (H) 80.0 - 100.0 fL   MCH 29.9 26.0 - 34.0 pg   MCHC 29.2 (L) 30.0 - 36.0 g/dL   RDW 17.8 (H) 11.5 - 15.5 %   Platelets 146 (L) 150 - 400 K/uL   nRBC 1.2 (H) 0.0 - 0.2 %    Comment: Performed at [facility] [address] [place], [place] [address]  Basic metabolic panel     Status: Abnormal   Collection Time: 04/11/19  1:22 PM  Result Value Ref Range   Sodium 140 135 - 145 mmol/L   Potassium 4.9 3.5 - 5.1 mmol/L   Chloride 103 98 - 111 mmol/L   CO2 22 22 - 32 mmol/L   Glucose, Bld 286 (H) 70 - 99 mg/dL   BUN 59 (H) 8 - 23 mg/dL   Creatinine, Ser 2.76 (H) 0.61 - 1.24 mg/dL   Calcium 6.4 (LL) 8.9 - 10.3 mg/dL    Comment: CRITICAL RESULT CALLED TO, READ BACK BY AND VERIFIED WITH: E.COLLERAN,RN @ 1430 04/11/2019 WEBBERJ    GFR calc non Af Amer 21 (L) >60 mL/min   GFR calc  Af Amer 25 (L) >60 mL/min   Anion gap 15 5 - 15    Comment: Performed at [facility] [address] [place], [place] [address]  Lactic acid, plasma     Status: Abnormal   Collection Time: 04/11/19  2:58 PM  Result Value Ref Range   Lactic Acid, Venous 4.7 (HH) 0.5 - 1.9 mmol/L    Comment: CRITICAL RESULT CALLED TO, READ BACK BY AND VERIFIED WITH: K.[place],RN @ 1542 04/11/2019 [place] Performed at [facility], [place] [address] [place], [place] [address]   Magnesium     Status: None   Collection Time: 04/11/19  3:08 PM  Result Value Ref Range   Magnesium 2.0 1.7 - 2.4 mg/dL    Comment: Performed at [facility], [place] [address], [place], [place] [address]  Phosphorus     Status: Abnormal   Collection Time: 04/11/19  3:08 PM  Result Value Ref Range   Phosphorus 7.1 (H) 2.5 - 4.6 mg/dL    Comment: Performed at [facility] [address] [place], [place] [address]  VITAMIN D 25 Hydroxy (Vit-D Deficiency, Fractures)     Status: Abnormal   Collection Time: 04/11/19  3:08 PM  Result Value Ref Range   Vit D, 25-Hydroxy 8.41 (L) 30 - 100 ng/mL    Comment: (NOTE) Vitamin D deficiency has been defined by the Institute of Medicine  and an Endocrine Society practice guideline as a level of serum 25-OH  vitamin D less than 20 ng/mL (1,2). The Endocrine Society went on to  further define vitamin D insufficiency as a level between 21 and 29  ng/mL (2). 1. IOM (Institute of Medicine). 2010. Dietary reference intakes for  calcium and D. [place]: The [employer]. 2. Holick MF, Binkley [place], Bischoff-Ferrari HA, et al. Evaluation,  treatment, and prevention of vitamin D deficiency: an Endocrine  Society clinical practice guideline, JCEM. 2011 Jul; 96(7): 1911-30. Performed at [facility], [place] [address], [place], [place] [address]   CBC with Differential/Platelet     Status: Abnormal   Collection Time: 04/11/19  3:08 PM  Result Value Ref Range   WBC 16.9 (H) 4.0 - 10.5 K/uL   RBC 2.48 (L) 4.22 - 5.81 MIL/uL   Hemoglobin 7.5 (L)  13.0 - 17.0 g/dL   HCT 24.4 (L) 39.0 - 52.0 %   MCV 98.4 80.0 - 100.0 fL   MCH 30.2 26.0 - 34.0 pg   MCHC 30.7 30.0 - 36.0 g/dL   RDW 17.8 (H) 11.5 - 15.5 %   Platelets 121 (L) 150 - 400 K/uL   nRBC 0.7 (H) 0.0 - 0.2 %   Neutrophils Relative % 95 %   Neutro Abs 16.1 (H) 1.7 - 7.7 K/uL   Lymphocytes Relative 2 %   Lymphs Abs 0.3 (L) 0.7 - 4.0 K/uL   Monocytes Relative 3 %   Monocytes Absolute 0.5 0.1 - 1.0 K/uL   Eosinophils Relative 0 %   Eosinophils Absolute 0.0 0.0 - 0.5 K/uL   Basophils Relative 0 %   Basophils Absolute 0.0 0.0 - 0.1 K/uL   nRBC 1 (H) 0 /100 WBC   Abs Immature Granulocytes 0.00 0.00 - 0.07 K/uL   Polychromasia PRESENT     Comment: Performed at [facility], [place] [address], [place], [place] [address]  Glucose, capillary     Status: Abnormal   Collection Time: 04/11/19  4:04 PM  Result Value Ref Range   Glucose-Capillary 122 (H) 70 - 99 mg/dL  I-STAT 7, (LYTES, BLD GAS, ICA, H+H)     Status: Abnormal   Collection Time: 04/11/19  4:37 PM  Result Value Ref Range   pH, Arterial 7.383 7.350 - 7.450   pCO2 arterial 42.8 32.0 - 48.0 mmHg   pO2, Arterial 195.0 (H) 83.0 - 108.0 mmHg   Bicarbonate 25.5 20.0 - 28.0 mmol/L   TCO2 27 22 - 32 mmol/L   O2 Saturation 100.0 %   Sodium 139 135 - 145 mmol/L   Potassium 5.1 3.5 - 5.1 mmol/L   Calcium, Ion 0.97 (L) 1.15 - 1.40 mmol/L   HCT 21.0 (L) 39.0 - 52.0 %   Hemoglobin 7.1 (L) 13.0 - 17.0 g/dL   Patient temperature 98.6 F    Collection site ARTERIAL LINE    Drawn by Operator    Sample type ARTERIAL   Basic metabolic panel     Status: Abnormal   Collection Time: 04/11/19  5:14 PM  Result Value Ref Range   Sodium 141 135 - 145 mmol/L   Potassium 5.2 (H) 3.5 - 5.1 mmol/L   Chloride 105 98 - 111 mmol/L   CO2 23 22 - 32 mmol/L   Glucose, Bld 113 (H) 70 - 99 mg/dL   BUN 59 (H) 8 - 23 mg/dL   Creatinine, Ser 2.58 (H) 0.61 - 1.24 mg/dL   Calcium 7.0 (L) 8.9 - 10.3 mg/dL   GFR calc non Af Amer 23 (L) >60 mL/min    GFR calc Af Amer 27 (L) >60 mL/min   Anion gap 13 5 - 15    Comment: Performed at [facility], [address], [place], [place] [address]  CBC with Differential/Platelet     Status: Abnormal   Collection Time: 04/11/19  5:14 PM  Result Value Ref Range   WBC 15.3 (H) 4.0 - 10.5 K/uL   RBC 2.48 (L) 4.22 - 5.81 MIL/uL   Hemoglobin 7.4 (L) 13.0 - 17.0 g/dL   HCT 23.6 (L) 39.0 - 52.0 %   MCV 95.2 80.0 - 100.0 fL   MCH 29.8 26.0 - 34.0 pg   MCHC 31.4 30.0 - 36.0 g/dL   RDW 17.5 (H) 11.5 - 15.5 %   Platelets 132 (L) 150 - 400 K/uL   nRBC 0.7 (H) 0.0 - 0.2 %   Neutrophils Relative % 86 %   Neutro Abs 13.0 (H) 1.7 - 7.7 K/uL   Lymphocytes Relative 2 %   Lymphs Abs 0.4 (L) 0.7 - 4.0 K/uL   Monocytes Relative 7 %   Monocytes Absolute 1.0 0.1 - 1.0 K/uL   Eosinophils Relative 0 %   Eosinophils Absolute 0.1 0.0 - 0.5 K/uL   Basophils Relative 0 %   Basophils Absolute 0.0 0.0 - 0.1 K/uL   Immature Granulocytes 5 %   Abs Immature Granulocytes 0.79 (H) 0.00 - 0.07 K/uL    Comment: Performed at [facility] [address], [place], [place] [address]  Lactic acid, plasma     Status: None   Collection Time: 04/11/19  5:14 PM  Result Value Ref Range   Lactic Acid, Venous 1.2 0.5 - 1.9 mmol/L    Comment: Performed at [facility] [address] [place], [place] [address]  Troponin I (High Sensitivity)     Status: Abnormal   Collection Time: 04/11/19  5:14 PM  Result Value Ref Range   Troponin I (  High Sensitivity) 257 (HH) <18 ng/L    Comment: CRITICAL VALUE NOTED.  VALUE IS CONSISTENT WITH PREVIOUSLY REPORTED AND CALLED VALUE. (NOTE) Elevated high sensitivity troponin I (hsTnI) values and significant  changes across serial measurements may suggest ACS but many other  chronic and acute conditions are known to elevate hsTnI results.  Refer to the Links section for chest pain algorithms and additional  guidance. Performed at [facility], [place] [address],  [place], [place] [address]   Glucose, capillary     Status: None   Collection Time: 04/11/19  9:09 PM  Result Value Ref Range   Glucose-Capillary 95 70 - 99 mg/dL  Magnesium     Status: None   Collection Time: 04/11/19  9:44 PM  Result Value Ref Range   Magnesium 1.7 1.7 - 2.4 mg/dL    Comment: Performed at [facility], [place] [address], [place], [place] [address]  Basic metabolic panel     Status: Abnormal   Collection Time: 04/11/19  9:44 PM  Result Value Ref Range   Sodium 141 135 - 145 mmol/L   Potassium 4.8 3.5 - 5.1 mmol/L   Chloride 105 98 - 111 mmol/L   CO2 23 22 - 32 mmol/L   Glucose, Bld 96 70 - 99 mg/dL   BUN 60 (H) 8 - 23 mg/dL   Creatinine, Ser 2.60 (H) 0.61 - 1.24 mg/dL   Calcium 6.8 (L) 8.9 - 10.3 mg/dL   GFR calc non Af Amer 23 (L) >60 mL/min   GFR calc Af Amer 27 (L) >60 mL/min   Anion gap 13 5 - 15    Comment: Performed at [facility] [address], [place], [place] [address]  Lactic acid, plasma     Status: None   Collection Time: 04/12/19 12:00 AM  Result Value Ref Range   Lactic Acid, Venous 1.2 0.5 - 1.9 mmol/L    Comment: Performed at [facility] [address], [place], [place] [address]  Troponin I (High Sensitivity)     Status: Abnormal   Collection Time: 04/12/19  1:30 AM  Result Value Ref Range   Troponin I (High Sensitivity) 271 (HH) <18 ng/L    Comment: CRITICAL VALUE NOTED.  VALUE IS CONSISTENT WITH PREVIOUSLY REPORTED AND CALLED VALUE. (NOTE) Elevated high sensitivity troponin I (hsTnI) values and significant  changes across serial measurements may suggest ACS but many other  chronic and acute conditions are known to elevate hsTnI results.  Refer to the Links section for chest pain algorithms and additional  guidance. Performed at [facility], [place] [address] [place], [place] [address]   Basic metabolic panel     Status: Abnormal   Collection Time: 04/12/19  1:30 AM  Result Value Ref Range   Sodium 140 135 - 145  mmol/L   Potassium 4.7 3.5 - 5.1 mmol/L   Chloride 102 98 - 111 mmol/L   CO2 25 22 - 32 mmol/L   Glucose, Bld 125 (H) 70 - 99 mg/dL   BUN 60 (H) 8 - 23 mg/dL   Creatinine, Ser 2.61 (H) 0.61 - 1.24 mg/dL   Calcium 7.1 (L) 8.9 - 10.3 mg/dL   GFR calc non Af Amer 23 (L) >60 mL/min   GFR calc Af Amer 27 (L) >60 mL/min   Anion gap 13 5 - 15    Comment: Performed at [facility] [address], [place], [place] [address]  CBC  Status: Abnormal   Collection Time: 04/12/19  5:18 AM  Result Value Ref Range   WBC 16.0 (H) 4.0 - 10.5 K/uL   RBC 2.66 (L) 4.22 - 5.81 MIL/uL   Hemoglobin 7.9 (L) 13.0 - 17.0 g/dL   HCT 25.2 (L) 39.0 - 52.0 %   MCV 94.7 80.0 - 100.0 fL   MCH 29.7 26.0 - 34.0 pg   MCHC 31.3 30.0 - 36.0 g/dL   RDW 18.0 (H) 11.5 - 15.5 %   Platelets 142 (L) 150 - 400 K/uL   nRBC 0.6 (H) 0.0 - 0.2 %    Comment: Performed at [facility] [address] [place], [place] [address]  Basic metabolic panel     Status: Abnormal   Collection Time: 04/12/19  5:18 AM  Result Value Ref Range   Sodium 142 135 - 145 mmol/L   Potassium 4.6 3.5 - 5.1 mmol/L   Chloride 104 98 - 111 mmol/L   CO2 25 22 - 32 mmol/L   Glucose, Bld 113 (H) 70 - 99 mg/dL   BUN 55 (H) 8 - 23 mg/dL   Creatinine, Ser 2.41 (H) 0.61 - 1.24 mg/dL   Calcium 7.4 (L) 8.9 - 10.3 mg/dL   GFR calc non Af Amer 25 (L) >60 mL/min   GFR calc Af Amer 29 (L) >60 mL/min   Anion gap 13 5 - 15    Comment: Performed at [facility] [address], [place], [place] [address]  Triglycerides     Status: None   Collection Time: 04/12/19  5:18 AM  Result Value Ref Range   Triglycerides 120 <150 mg/dL    Comment: Performed at [facility] [address], [place], [place] [address]  Protime-INR     Status: Abnormal   Collection Time: 04/12/19  5:18 AM  Result Value Ref Range   Prothrombin Time 16.0 (H) 11.4 - 15.2 seconds   INR 1.3 (H) 0.8 - 1.2    Comment: (NOTE) INR goal varies based on device and  disease states. Performed at [facility], [place] [address] [place], [place] [address]   Glucose, capillary     Status: Abnormal   Collection Time: 04/12/19  6:57 AM  Result Value Ref Range   Glucose-Capillary 106 (H) 70 - 99 mg/dL  Glucose, capillary     Status: Abnormal   Collection Time: 04/12/19  8:25 AM  Result Value Ref Range   Glucose-Capillary 112 (H) 70 - 99 mg/dL  Troponin I (High Sensitivity)     Status: Abnormal   Collection Time: 04/12/19  8:47 AM  Result Value Ref Range   Troponin I (High Sensitivity) 239 (HH) <18 ng/L    Comment: CRITICAL VALUE NOTED.  VALUE IS CONSISTENT WITH PREVIOUSLY REPORTED AND CALLED VALUE. (NOTE) Elevated high sensitivity troponin I (hsTnI) values and significant  changes across serial measurements may suggest ACS but many other  chronic and acute conditions are known to elevate hsTnI results.  Refer to the Links section for chest pain algorithms and additional  guidance. Performed at [facility], [place] [address] [place], [place] [address]   Glucose, capillary     Status: Abnormal   Collection Time: 04/12/19 11:55 AM  Result Value Ref Range   Glucose-Capillary 140 (H) 70 - 99 mg/dL  Troponin I (High Sensitivity)     Status: Abnormal   Collection Time: 04/12/19  1:00 PM  Result Value Ref Range   Troponin  I (High Sensitivity) 263 (HH) <18 ng/L    Comment: CRITICAL VALUE NOTED.  VALUE IS CONSISTENT WITH PREVIOUSLY REPORTED AND CALLED VALUE. (NOTE) Elevated high sensitivity troponin I (hsTnI) values and significant  changes across serial measurements may suggest ACS but many other  chronic and acute conditions are known to elevate hsTnI results.  Refer to the Links section for chest pain algorithms and additional  guidance. Performed at [facility], [place] [address] [place], [place] [address]   Glucose, capillary     Status: Abnormal   Collection Time: 04/12/19  2:59 PM  Result Value Ref Range   Glucose-Capillary 112  (H) 70 - 99 mg/dL    ECG   Sinus rhythm with PACs and PVCs, bifascicular block, prolonged QTC 548 ms- Personally Reviewed  Telemetry   Sinus rhythm with PVCs (yesterday evening, had junctional bradycardia following periods of ventricular bigeminy) - Personally Reviewed  Radiology   Dg Abd 1 View  Result Date: 04/11/2019 CLINICAL DATA:  Enteric tube placement. EXAM: ABDOMEN - 1 VIEW COMPARISON:  Abdominal x-ray dated April 06, 2019. FINDINGS: Enteric tube looped in the stomach. New mild central small bowel dilatation. Air and stool within the colon. No acute osseous abnormality. No radio-opaque calculi or other significant radiographic abnormality are seen. IMPRESSION: 1. Enteric tube within the stomach. 2. New mild central small bowel dilatation may reflect ileus. Electronically Signed   By: Titus Dubin M.D.   On: 04/11/2019 15:02   Ct Chest Wo Contrast  Result Date: 04/12/2019 CLINICAL DATA:  Acute respiratory illness EXAM: CT CHEST WITHOUT CONTRAST TECHNIQUE: Multidetector CT imaging of the chest was performed following the standard protocol without IV contrast. COMPARISON:  Chest radiograph dated 04/11/19 FINDINGS: Cardiovascular: A right internal jugular central venous catheter tip terminates at the superior cavoatrial junction. The main pulmonary artery and the left and right pulmonary arteries are enlarged (main pulmonary artery measures 3.2 cm) and likely reflect pulmonary hypertension. Vascular calcifications are seen in the coronary arteries, aortic arch, and abdominal aorta. The heart is mildly enlarged. No pericardial effusion. Mediastinum/Nodes: Prominent paratracheal lymph nodes are likely reactive. No enlarged axillary lymph nodes. The thyroid appears normal. An endotracheal tube terminates in the upper thoracic trachea. Lungs/Pleura: There are moderate bilateral pleural effusions with associated atelectasis. There is no pneumothorax Upper Abdomen: An enteric tube terminates  near the pylorus. A large fluid collection/hematoma along the left abdominal wall is partially imaged. The right kidney is trophic. Abdominal ascites is seen. Musculoskeletal: There are acute fractures of the anterior right second and fourth and the anterior left fourth and fifth ribs. Median sternotomy wires are noted. IMPRESSION: 1. Moderate bilateral pleural effusions with associated atelectasis. 2. Mild cardiomegaly. 3. Enlarged pulmonary arteries, suggesting pulmonary hypertension. 4. Large fluid collection/hematoma along the left abdominal wall, partially imaged. 5. Acute fractures of the anterior right second and fourth and the anterior left fourth and fifth ribs. Aortic Atherosclerosis (ICD10-I70.0). Electronically Signed   By: Zerita Boers M.D.   On: 04/12/2019 15:14   Dg Chest Port 1 View  Result Date: 04/11/2019 CLINICAL DATA:  Unresponsive, EXAM: PORTABLE CHEST 1 VIEW COMPARISON:  04/06/2019 FINDINGS: Endotracheal tube with the tip 2.5 cm above the carina. Bilateral diffuse mild interstitial thickening. No focal consolidation, pleural effusion or pneumothorax. Stable cardiomegaly. Prior median sternotomy. Right jugular central venous catheter with the tip projecting over the cavoatrial junction. IMPRESSION: 1. Bilateral mild interstitial thickening most concerning for mild interstitial edema. 2. Support lines and tubing in satisfactory  position. Electronically Signed   By: Kathreen Devoid   On: 04/11/2019 13:47    Cardiac Studies   Procedure: 3D Echo, Transesophageal Echo, Color Doppler and Cardiac Doppler  Indications:     Bacteremia 790.7   History:         Patient has prior history of Echocardiogram examinations, most                  recent 03/28/2019. Prior CABG; sepsis. Aortic Valve: A                  mechanical aortic valve prosthesis valve is present in the                  aortic position.   Sonographer:     Johny Chess Referring Phys:  [place] Diagnosing Phys: Oswaldo Milian MD     PROCEDURE: Patients was under conscious sedation during this procedure. Anesthetic was administered intravenously by performing Physician: 27mg of Fentanyl, 4.013mof Versed. The transesophogeal probe was passed through the esophogus of the patient. The  patient developed no complications during the procedure.  IMPRESSIONS    1. No vegetation seen  2. Left ventricular ejection fraction, by visual estimation, is 45 to 50%. The left ventricle has mildly decreased function. There is mildly increased left ventricular hypertrophy.  3. Global right ventricle has normal systolic function.The right ventricular size is normal.  4. Severe mitral annular calcification.  5. The mitral valve is degenerative. Mild to moderate mitral valve regurgitation.  6. The tricuspid valve is normal in structure. Tricuspid valve regurgitation is mild.  7. Mechanical prosthesis in the aortic valve position. No paravalvular leak  8. The pulmonic valve was not well visualized. Pulmonic valve regurgitation is not visualized by color flow Doppler.  Impression   Principal Problem:   Endocarditis Active Problems:   H/O prosthetic aortic valve replacement   Hx of CABG   Renal transplant recipient   Seps[facility] ([facility]  Community acquired pneumonia   AKI (acute kidney injury) ([facility]  Fever   Abdominal discomfort   Cardiac arrest ([facility]  Junctional bradycardia   Recommendation   1. Mr. TuCastellananfortunately suffered a PEA arrest however I suspect it may have been due to hyperkalemia or possibly hypoxia.  A subsequent postarrest echo actually looks unchanged from his most recent TEE which was about a week ago.  LVEF has been preserved at 45 to 50%.  His mechanical aortic valve is working properly.  He is complaining of chest wall pain today which is clearly reproducible along the left sternal border.  His EKG today shows no acute ischemic changes.   Serial troponins were ordered and are flat at 271 and 239.  I do not suspect his pain is due to acute coronary syndrome rather likely related to CPR. CT scan performed today does show rib fractures. He had a large RP bleed during this admission, requiring blood products and reversal of anticoagulation - H/H has been stable, therefore, likely safe to resume IV heparin for mechanical aortic valve and monitor hemoglobin. As per the literature, we could safely be off anticoagulation for a mechanical AVR for 1 week. Finally, with regards to the bradycardia - there is confusion in the chart. He is listed as having an AICD, which he does not - just a loop recorder which is dead. He had monitoring last year for what sounds like a similar episode of bradycardia with  the same findings - junctional bradycardia, PVC's in bigeminy, however, it did not necessitate a pacer. This could be conduction disease or possibly vagally mediated, due to pain and PPV. Would wean dopamine off and monitor. Avoid AVN blocking medications. Given recent sepsis and bleeding, not an ideal candidate for PPM at this time.  Thanks for the consultation. Cardiology will follow with you.  Time Spent Directly with Patient:  I have spent a total of 60 minutes with the patient reviewing hospital notes, telemetry, EKGs, labs and examining the patient as well as establishing an assessment and plan that was discussed personally with the patient.  > 50% of time was spent in direct patient care.  Length of Stay:  LOS: 16 days   Pixie Casino, MD, [facility], [place] Director of the Advanced Lipid Disorders &  Cardiovascular Risk Reduction Clinic Diplomate of the American Board of Clinical Lipidology Attending Cardiologist  Direct Dial: [phone]   Fax: [phone]  Website:  [email or web address] 04/12/2019, 4:24 PM

## 2019-04-12 NOTE — Progress Notes (Signed)
ANTICOAGULATION CONSULT NOTE - Follow Up Consult  Pharmacy Consult for Heparin Indication: Afib, mechanical AVR   Patient Measurements: Height: 5\' 10"  (177.8 cm) Weight: 224 lb 6.9 oz (101.8 kg) IBW/kg (Calculated) : 73 Heparin dosing weight 86 kg  Vital Signs: Temp: 97.9 F (36.6 C) (10/25 1500) Temp Source: Axillary (10/25 1500) BP: 106/80 (10/25 1100) Pulse Rate: 72 (10/25 1200)  Labs: Recent Labs    04/11/19 1508 04/11/19 1637 04/11/19 1714 04/11/19 2144 04/12/19 0130 04/12/19 0518 04/12/19 0847 04/12/19 1300  HGB 7.5* 7.1* 7.4*  --   --  7.9*  --   --   HCT 24.4* 21.0* 23.6*  --   --  25.2*  --   --   PLT 121*  --  132*  --   --  142*  --   --   LABPROT  --   --   --   --   --  16.0*  --   --   INR  --   --   --   --   --  1.3*  --   --   CREATININE  --   --  2.58* 2.60* 2.61* 2.41*  --   --   TROPONINIHS  --   --  257*  --  271*  --  239* 263*    Estimated Creatinine Clearance: 31.7 mL/min (A) (by C-G formula based on SCr of 2.41 mg/dL (H)).  Assessment: 48 yom admitted 10/9 with INR of 2.4 on warfarin PTA (7 mg Tuesday and Thursday, 5 mg all other days per med rec). Patient has a mechanical aortic valve. Warfarin held on 10/13-10/15 and received 1x 5 mg dose of vitamin k on 10/15 so pt could get TEE.   Patient continued on IV heparin/warfarin until 10/18, when he had a RP bleed, and anticoagulation stopped and reversed. -CT showed Large fluid collection/hematoma along the left abdominal wall    Discussed with MD. Will continue to hold heparin    Goal of Therapy:  Heparin level lower end of therapeutic range (0.3-0.5) Monitor platelets by anticoagulation protocol: Yes  INR 2.5 to 3   Plan:  -Hold heparin  -Will follow patient progress  Hildred Laser, PharmD Clinical Pharmacist **Pharmacist phone directory can now be found on [email or web address] ([email or web address]).  Listed under [place].

## 2019-04-12 NOTE — Progress Notes (Signed)
Patient transported to CT & back on the vent with no complications.  Ashley Mariner RRT

## 2019-04-12 NOTE — Progress Notes (Signed)
   PM rounds  S: seen at bedside. Cards has stopped his dopamine. Doing well on SBT. Signals etubation desireu  O Looks well Oriented  CT chest - shows effusion and some associated consolidation. Also rib fractures   A/P Acut resp failure - extubate. Will do QHS bibpap  Rib frcture and mechanical heart valve - d/w Dr Debara Pickett - start IV heparin 04/13/19      SIGNATURE    Dr. Brand Males, M.D., F.C.C.P,  Pulmonary and Critical Care Medicine Staff Physician, [place] Director - Interstitial Lung Disease  Program  P[facility] at [place], [place], [address]  Pager: [phone], If no answer or between  15:00h - 7:00h: call [phone] Telephone: [phone]  4:26 PM 04/12/2019

## 2019-04-12 NOTE — Progress Notes (Signed)
Called back to see patient  S: patient with precordial pain post fent gtt stopping and starting SBT - new. Was not there yesterday  O EKG - nil acute QTc is > 585msec  A - chest pain - either brusing of chest wall from cPR or ischemic  P  - stop SBT - start PRVC  - cotninue prn fent gtt  - get ct chest wo contrast - cycle troponin  - await cardiology     SIGNATURE    Dr. Brand Males, M.D., F.C.C.P,  Pulmonary and Critical Care Medicine Staff Physician, [place] Director - Interstitial Lung Disease  Program  Pulmonary [facility] at [place], [place], [address]  Pager: [phone], If no answer or between  15:00h - 7:00h: call [phone] Telephone: [phone]  11:56 AM 04/12/2019

## 2019-04-12 NOTE — Progress Notes (Addendum)
[facility] Progress Note Patient Name: John Barton DOB: 05-Apr-1946 MRN: [id number]   Date of Service  04/12/2019  HPI/Events of Note  Bradycardia - HR = 37 to 53.  eICU Interventions  Will order: 1. D/C Scopolamine patch. 2. Dopamine IV infusion. Titrate to MAP ? 65 and HR > 60.  3. Check BMP STAT.     Intervention Category Major Interventions: Arrhythmia - evaluation and management  Sommer,Steven Eugene 04/12/2019, 12:36 AM

## 2019-04-12 NOTE — Progress Notes (Signed)
[facility]    NEPHROLOGY PROGRESS NOTE  SUBJECTIVE: Patient awake on the ventilator in the ICU when examined earlier this morning.  Episodes of bradycardia noted.  Cardiology consulted.  OBJECTIVE:  Vitals:   04/12/19 1153 04/12/19 1200  BP:    Pulse:  72  Resp:    Temp: 97.8 F (36.6 C)   SpO2:  95%    Intake/Output Summary (Last 24 hours) at 04/12/2019 1522 Last data filed at 04/12/2019 0800 Gross per 24 hour  Intake 2226.41 ml  Output 1700 ml  Net 526.41 ml      General: Awake NAD, no acute complaints HEENT: MMM [place] AT anicteric sclera, orally intubated Neck:  No JVD, no adenopathy CV:  Heart RRR  Lungs:  L/S CTA bilaterally Abd:  abd marked distention with normal BS GU:  Bladder non-palpable Extremities: +2 bilateral lower extremity edema Skin:  No skin rash  MEDICATIONS:  . aspirin  81 mg Per Tube Daily  . atorvastatin  80 mg Per Tube Daily  . [START ON 2019/04/25] calcitRIOL  0.5 mcg Per Tube Once per day on Tue Thu Sat   And  . calcitRIOL  0.5 mcg Per Tube Once per day on Sun Mon Wed Fri  . chlorhexidine gluconate (MEDLINE KIT)  15 mL Mouth Rinse BID  . Chlorhexidine Gluconate Cloth  6 each Topical Daily  . influenza vaccine adjuvanted  0.5 mL Intramuscular Tomorrow-1000  . insulin aspart  0-9 Units Subcutaneous TID WC  . mouth rinse  15 mL Mouth Rinse 10 times per day  . mycophenolate  1,000 mg Per Tube BID  . pantoprazole sodium  40 mg Per Tube Daily  . pneumococcal 23 valent vaccine  0.5 mL Intramuscular Tomorrow-1000  . sodium chloride flush  10-40 mL Intracatheter Q12H  . sodium chloride flush  10-40 mL Intracatheter Q12H       LABS:   CBC Latest Ref Rng & Units 04/12/2019 04/11/2019 04/11/2019  WBC 4.0 - 10.5 K/uL 16.0(H) 15.3(H) -  Hemoglobin 13.0 - 17.0 g/dL 7.9(L) 7.4(L) 7.1(L)  Hematocrit 39.0 - 52.0 % 25.2(L) 23.6(L) 21.0(L)  Platelets 150 - 400 K/uL 142(L) 132(L) -    CMP Latest Ref Rng & Units 04/12/2019 04/12/2019  04/11/2019  Glucose 70 - 99 mg/dL 113(H) 125(H) 96  BUN 8 - 23 mg/dL 55(H) 60(H) 60(H)  Creatinine 0.61 - 1.24 mg/dL 2.41(H) 2.61(H) 2.60(H)  Sodium 135 - 145 mmol/L 142 140 141  Potassium 3.5 - 5.1 mmol/L 4.6 4.7 4.8  Chloride 98 - 111 mmol/L 104 102 105  CO2 22 - 32 mmol/L 25 25 23   Calcium 8.9 - 10.3 mg/dL 7.4(L) 7.1(L) 6.8(L)  Total Protein 6.5 - 8.1 g/dL - - -  Total Bilirubin 0.3 - 1.2 mg/dL - - -  Alkaline Phos 38 - 126 U/L - - -  AST 15 - 41 U/L - - -  ALT 0 - 44 U/L - - -    Lab Results  Component Value Date   CALCIUM 7.4 (L) 04/12/2019   CAION 0.97 (L) 04/11/2019   PHOS 7.1 (H) 04/11/2019       Component Value Date/Time   COLORURINE AMBER (A) 04/10/2019 1600   APPEARANCEUR CLEAR 04/10/2019 1600   LABSPEC 1.019 04/10/2019 1600   PHURINE 5.0 04/10/2019 1600   GLUCOSEU NEGATIVE 04/10/2019 1600   HGBUR NEGATIVE 04/10/2019 1600   BILIRUBINUR NEGATIVE 04/10/2019 1600   BILIRUBINUR Negative 11/17/2013   KETONESUR NEGATIVE 04/10/2019 1600   PROTEINUR 30 (A)  04/10/2019 1600   UROBILINOGEN 0.2 12/04/2008 1835   NITRITE NEGATIVE 04/10/2019 1600   LEUKOCYTESUR NEGATIVE 04/10/2019 1600      Component Value Date/Time   PHART 7.383 04/11/2019 1637   PCO2ART 42.8 04/11/2019 1637   PO2ART 195.0 (H) 04/11/2019 1637   HCO3 25.5 04/11/2019 1637   TCO2 27 04/11/2019 1637   O2SAT 100.0 04/11/2019 1637       Component Value Date/Time   FERRITIN 293 04/17/2019 1912       ASSESSMENT/PLAN:    1. AKI/CKD stage 3 presumably due to ischemic ATN in setting of volume depletion and hypotension.  1.  Recent renal transplant ultrasound with no evidence of obstruction or increased resistive indices. 2. Renal function improving.  Excellent urine output off of diuretics on dopamine. 2. Sepsis with hypotension- blood cultures negative to date, TTE with oscillating density in the mitral valve and awaiting TEE negative for vegetation. 1. Status post dapto and ceftriaxone per  ID. 3. Diarrhea- resolved. 4. Hyperkalemia.  Likely due to resorption of blood in retroperitoneum.  S/p insulin, D50, and Lokelma.  Immediately post cardiac arrest potassium within normal limits.  No further episodes noted of potassium elevation noted.   5. Metabolic acidosis- improved.  Off sodium bicarbonate. 6. Hematuria-Foley 7. Renal transplant from sister only taking cellcept.changed to myfortic due to diarrhea but changed back to cellcept which is what he has been on for years. 8. Hypocalcemia.  Vitamin D level markedly low.  Will replete.  Continue calcitriol.  Being supplemented IV with calcium gluconate as needed.   9. Status post cardiac arrest.  Question respiratory event.  Monitor potassium closely. 10. Disposition- when creat stable, can follow up with Dr. Jimmy Footman above. Gaylene Brooks, DO, FACP

## 2019-04-12 NOTE — Progress Notes (Addendum)
NAME:  John Barton, MRN:  [id number], DOB:  Dec 19, 1945, LOS: 91 ADMISSION DATE:  04/10/2019, CONSULTATION DATE: 04/06/19 REFERRING MD:  Dr. 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    Admitted on 10/10, febrile, getting treated for presumed endocarditis.   Cultures negative, TEE eventually was able to be done and was negative for vegetations.    On coumadin at home, was supratheraputic, coumadin reversed, now was getting bridged back to ac with coumadin, had been on heparin infusion.   Developed hypotension with drop in H/H, CT abd done, large RP hematoma, heterogenous.     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   Cultures negative, TEE eventually was able to be done and was negative for vegetations.     10/19 - Transferred to ICU 10/19 with RP hemorhage ( On coumadin at home, was supratheraputic, coumadin reversed, now was getting bridged back to ac with coumadin, had been on heparin infusion.  Developed hypotension with drop in H/H, CT abd done, large RP hematoma, heterogenous.  )   10/20 - Oliguria overnight>> given Lasix 40 per E Link MD>> + 5.8 L Creatinine up  trending from 1.82 to 2.30 overnight   HGB drop from 11 to 9.8 overnight Poor intake States his abdomen still hurts but is better than it was yesterday from pain perspective Per  nursing it is less tight and distended than yesterday  HGB Stabilized after 6 u prbc and 2 u ffp given rapidly. Reversed with protamine, also given Kcentra and vitamin K   04/08/2019  - Presumed endocarditis -since he is high risk, ID is recommending long-term ceftaroline with end date 05/15/2019. He is s/p 4 units PRBC. Off pressors. Not on vent. No delirium. Has pain and gets prn fentanyl - Pulse ox 100% on 2L [place]. Per RN has tendency to drop pulse ox when he gets fentanyl 80mcg prn. He refuses percocet. This is for flank pain. He refuses percocet. Not on anticoagulation yet. hgb 7.8 (was 8.2 yesterday). Transferred out of ICU 10/21    10/24: In Hospital cardiac arrest. Patient was sitting in chair, nurse went to assist back to bed. After standing, patient with agonal respirations and then became unresponsive. CODE BLUE was called. He received 2 amps calcium gluconate, 1 amp bicarb and 2 amps epi.  Intubated in the unit.  Thought to be due to high K (frfom RP blood resoprtion) despite K Rx  Consults:  ID Nephrology Cardiology  Procedures:  10/19 Cordis RIJ 10/19 R radial art line  Significant Diagnostic Tests:   TEE 03/25/2019 1. No vegetation seen  2. Left ventricular ejection fraction, by visual estimation, is 45 to 50%. The left ventricle has mildly decreased function. There is mildly increased left ventricular hypertrophy.  3. Global right ventricle has normal systolic function.The right ventricular size is normal.  4. Severe  mitral annular calcification.  5. The mitral valve is degenerative. Mild to moderate mitral valve regurgitation.  6. The tricuspid valve is normal in structure. Tricuspid valve regurgitation is mild.  7. Mechanical prosthesis in the aortic valve position. No paravalvular leak  8. The pulmonic valve was not well visualized. Pulmonic valve regurgitation is not visualized by color flow Doppler.  Micro Data:  10/9 Bl cx neg  Antimicrobials:   Ceftriaxone  10/14 - current Daptomycin 10/13 --> current  Cefepime 10/9-10/13  Interim history/subjective:   10/25  - On fent gtt 154mcg -> follows commands, Normal mental status. On PRVC.   Per RN - ectopy last night.. Overnight brady with HR 50s (was 30s) and scopalamine stoppd and diprivan stooped and dopamine started. Did have mag and calcium corrected. Currently HR 50s with SBP 150s. Still not on heparin gtt. CT chest pending for rib fracture rule out. No active bled. Renal holding off diuresis. K normalized. Creat better at 2.4  ECHO ef 45% and slight educed  Objective   Blood pressure 114/86, pulse (!) 34, temperature 98.1 F (36.7 C), temperature source Oral, resp. rate 12, height 5\' 10"  (1.778 m), weight 101.8 kg, SpO2 99 %.    Vent Mode: PRVC FiO2 (%):  [40 %-100 %] 40 % Set Rate:  [18 bmp] 18 bmp Vt Set:  [580 mL] 580 mL PEEP:  [8 cmH20] 8 cmH20 Plateau Pressure:  [20 cmH20-25 cmH20] 23 cmH20   Intake/Output Summary (Last 24 hours) at 04/12/2019 1042 Last data filed at 04/12/2019 0700 Gross per 24 hour  Intake 2226.41 ml  Output 1850 ml  Net 376.41 ml   Filed Weights   04/10/19 0613 04/11/19 0436 04/12/19 0500  Weight: 108 kg 107.6 kg 101.8 kg    General Appearance:  Looks criticall ill on vent. Awake  Head:  Normocephalic, without obvious abnormality, atraumatic Eyes:  PERRL - yes, conjunctiva/corneas - muddy     Ears:  Normal external ear canals, both ears Nose:  G tube - no Throat:  ETT TUBE - yesy , OG tube - yes Neck:  Supple,  No enlargement/tenderness/nodules Lungs: Clear to auscultation bilaterally, Ventilator   Synchrony - yes on PRVC Heart:  S1 and S2 normal, no murmur, CVP - no.  Pressors - dopamine Abdomen:  Soft, no masses, no organomegaly Genitalia / Rectal:  Not done Extremities:  Extremities- intact Skin:  ntact in exposed areas . Sacral area - not examined Neurologic:  Sedation - fent gtt -> RASS - +1 . Moves all 4s - yes . CAM-ICU - neg . Orientation -  follows command      Resolved Hospital Problem list    Assessment & Plan:  Admit problem 04/09/2019 of presumed endocarditis  (TEE 10/16 no endocarditis)   - 04/12/2019 - no fever  Plan  - ID recommended long-term ceftaroline with end date 05/15/2019 - CCM might need to recall ID 04/13/2019 to reevaluate the premise of endocarditis   RP  Hemorrhage 04/06/2019  In setting of anticoagulation for baseline mech aortic valve   - 04/12/2019 - no active bleed. Not on systemic anticoagulation since 04/06/2019  PLan  - get ct chest wo contrast and depending on results start IV heparin    In-house cardiac arrest 04/11/2019 -  presumably vaso vagal and high K in setting low EF 45% + Long QTc 581msec (seeon 10/21 and 10/24)   04/12/2019 - no hyperkalemia  Plan  - cards consult - evalaute Qtc   Acute hypoxic respiratory  failure - -Intubated 10/24  04/12/2019 - meets SBT criteria for acute resp failure  Plan - dc fent gtt - do fent prn and versed prn     Baseline - Combined systolic and diastolic heart failure CAD s/p CABG 1995 - Echo 10/10, EF 45 to 50%, unchanged. - post arrest echo 10/24 - similar  Plan -Asa/statin - cards consult Dr Debara Pickett called    Baseline - Mechanical aortic valve, prosthetic   04/12/2019  - now 4-5 days without systemic anticoagulation in setting of RP bleed. . Plan was for to restart 04/11/2019 but then had cardiac arrest. No evidence of activ bleeding  Plan  = ct chest and if no rib fracture - start IV heparin gtt 04/12/2019  Baseline - Sinus bradycardia  - s/p loop recorder Long QTc 04/11/2019 and ? baseline   04/12/2019 - having bradycardia post arrest . On dopamine. overnight bradycardia and ectopy. Now on dopamine 75mcg wit HR 50s ans bsbp 150s. No evidence of RV strain on post arrest echo. EKG yesterday with long QTc    Plan  - cards consult = get ekg   AKI, s/p kidney transplant. - on cellcept Solitary Kidney CKD stage  IIIb - -baseline cr: 1.3  Plan: -Cellcept  - renal following (holding off lasix)   Abdominal distention 10/24/ - possible ileus on XR  04/12/2019 -abd soft  Plan:  -KUB -NGT to LIWS - hold off TF     BPH  Plan  - Hold Flomax.  Type 2 diabetes mellitus - A1c 6.6 in 2018 Plan: -SSI:   Best practice:  Diet: NPO Pain/Anxiety/Delirium protocol (if indicated):Fentanyl drip with PRN versed VAP protocol (if indicated): Yes DVT prophylaxis: SCDs.  GI prophylaxis: PPI Glucose control: CBG Q 4, Mobility: BR  Code Status: Full Family Communication: Daughter at bedside and updated 04/11/2019    ATTESTATION & SIGNATURE   The patient CLESTER NOLE is critically ill with multiple organ systems failure and requires high complexity decision making for assessment and support, frequent evaluation and titration of therapies, application of advanced monitoring technologies and extensive interpretation of multiple databases.   Critical Care Time devoted to patient care services described in this note is  45  Minutes. This time reflects time of care of this signee Dr Brand Males. This critical care time does not reflect procedure time, or teaching time or supervisory time of PA/NP/Med student/Med Resident etc but could involve care discussion time     Dr. Brand Males, M.D., [facility].C.P Pulmonary and Critical Care Medicine Staff Physician [place] [facility] Pager: [phone], If no answer or between  15:00h - 7:00h: call [phone]  04/12/2019 11:18 AM    LABS   Results for EMMA, DELLAPORTA (MRN [id number]) as of 04/12/2019 10:45  Ref. Range 04/09/2019 07:59 04/11/2019 13:22 04/11/2019 17:14 04/12/2019 01:30 04/12/2019 08:47  Troponin I (High Sensitivity) Latest Ref Range: <18 ng/L 431 (HH) 243 (HH) 257 (HH) 271 (HH) 239 (HH)   PULMONARY Recent Labs  Lab 04/11/19 1637  PHART 7.383  PCO2ART 42.8  PO2ART 195.0*  HCO3  25.5  TCO2 27  O2SAT 100.0    CBC Recent Labs  Lab 04/11/19 1508 04/11/19 1637 04/11/19 1714 04/12/19 0518  HGB 7.5* 7.1* 7.4* 7.9*  HCT 24.4* 21.0* 23.6* 25.2*  WBC 16.9*  --  15.3* 16.0*  PLT 121*  --  132* 142*    COAGULATION Recent Labs  Lab 04/06/19 2316 04/07/19  0600 04/08/19 0332 04/09/19 0500 04/12/19 0518  INR 1.2 1.1 1.2 1.2 1.3*    CARDIAC  No results for input(s): TROPONINI in the last 168 hours. No results for input(s): PROBNP in the last 168 hours.   CHEMISTRY Recent Labs  Lab 04/06/19 0428  04/07/19 0408 04/08/19 0332 04/09/19 0500  04/11/19 1322 04/11/19 1508 04/11/19 1637 04/11/19 1714 04/11/19 2144 04/12/19 0130 04/12/19 0518  NA 141   < > 138 138  --    < > 140  --  139 141 141 140 142  K 3.5   < > 5.0 4.8  --    < > 4.9  --  5.1 5.2* 4.8 4.7 4.6  CL 106   < > 105 106  --    < > 103  --   --  105 105 102 104  CO2 25   < > 23 23  --    < > 22  --   --  23 23 25 25   GLUCOSE 173*   < > 171* 141*  --    < > 286*  --   --  113* 96 125* 113*  BUN 24*   < > 33* 34*  --    < > 59*  --   --  59* 60* 60* 55*  CREATININE 1.82*   < > 2.31* 1.96*  --    < > 2.76*  --   --  2.58* 2.60* 2.61* 2.41*  CALCIUM 6.9*   < > 6.8* 6.8*  --    < > 6.4*  --   --  7.0* 6.8* 7.1* 7.4*  MG 1.4*  --   --   --  1.9  --   --  2.0  --   --  1.7  --   --   PHOS 3.8  --  4.9* 4.0 4.5  --   --  7.1*  --   --   --   --   --    < > = values in this interval not displayed.   Estimated Creatinine Clearance: 31.7 mL/min (A) (by C-G formula based on SCr of 2.41 mg/dL (H)).   LIVER Recent Labs  Lab 04/06/19 1159  04/06/19 2316 04/07/19 0408 04/07/19 0600 04/08/19 0332 04/09/19 0500 04/11/19 1322 04/12/19 0518  AST 25  --   --   --   --  30 33 180*  --   ALT 22  --   --   --   --  20 24 154*  --   ALKPHOS 54  --   --   --   --  70 56 67  --   BILITOT 0.4  --   --   --   --  1.0 1.0 1.6*  --   PROT 4.8*  --   --   --   --  5.2* 5.4* 5.2*  --   ALBUMIN 2.6*  --    --  2.7*  --  2.7* 2.9* 2.7*  --   INR  --    < > 1.2  --  1.1 1.2 1.2  --  1.3*   < > = values in this interval not displayed.     INFECTIOUS Recent Labs  Lab 04/11/19 1458 04/11/19 1714 04/12/19 0000  LATICACIDVEN 4.7* 1.2 1.2     ENDOCRINE CBG (last 3)  Recent Labs    04/11/19 2109 04/12/19 0657 04/12/19 0825  GLUCAP 95 106* 112*

## 2019-04-12 NOTE — Progress Notes (Signed)
RT placed pt on Servo I in NIV/BIPAP mode with settings of 15/5 and FIO2 60%. Pt respiratory status is stable at this time on NIV. RT will continue to monitor.

## 2019-04-12 NOTE — Procedures (Signed)
Extubation Procedure Note  Patient Details:   Name: John Barton DOB: November 27, 1945 MRN: [id number]   Airway Documentation:    Vent end date: 04/12/19 Vent end time: 1628   Evaluation  O2 sats: transiently fell during during procedure and currently acceptable Complications: No apparent complications Patient did tolerate procedure well. Bilateral Breath Sounds: Clear, Diminished   Patient extubated to 4L [place]. SpO2 dropped to 86%, O2 increased to 6L. Patient able to cough & speak post extubation. IS instructed 750x5.  Kathie Dike 04/12/2019, 4:35 PM

## 2019-04-13 ENCOUNTER — Inpatient Hospital Stay (HOSPITAL_COMMUNITY): Payer: Medicare Other

## 2019-04-13 DIAGNOSIS — R001 Bradycardia, unspecified: Secondary | ICD-10-CM

## 2019-04-13 DIAGNOSIS — I4891 Unspecified atrial fibrillation: Secondary | ICD-10-CM

## 2019-04-13 LAB — PHOSPHORUS: Phosphorus: 5.4 mg/dL — ABNORMAL HIGH (ref 2.5–4.6)

## 2019-04-13 LAB — BASIC METABOLIC PANEL
Anion gap: 11 (ref 5–15)
BUN: 55 mg/dL — ABNORMAL HIGH (ref 8–23)
CO2: 27 mmol/L (ref 22–32)
Calcium: 6.8 mg/dL — ABNORMAL LOW (ref 8.9–10.3)
Chloride: 103 mmol/L (ref 98–111)
Creatinine, Ser: 2.2 mg/dL — ABNORMAL HIGH (ref 0.61–1.24)
GFR calc Af Amer: 33 mL/min — ABNORMAL LOW (ref >60)
GFR calc non Af Amer: 28 mL/min — ABNORMAL LOW (ref >60)
Glucose, Bld: 108 mg/dL — ABNORMAL HIGH (ref 70–99)
Potassium: 5 mmol/L (ref 3.5–5.1)
Sodium: 141 mmol/L (ref 135–145)

## 2019-04-13 LAB — CBC
HCT: 24.7 % — ABNORMAL LOW (ref 39.0–52.0)
Hemoglobin: 7.6 g/dL — ABNORMAL LOW (ref 13.0–17.0)
MCH: 29.9 pg (ref 26.0–34.0)
MCHC: 30.8 g/dL (ref 30.0–36.0)
MCV: 97.2 fL (ref 80.0–100.0)
Platelets: 136 10*3/uL — ABNORMAL LOW (ref 150–400)
RBC: 2.54 MIL/uL — ABNORMAL LOW (ref 4.22–5.81)
RDW: 18.2 % — ABNORMAL HIGH (ref 11.5–15.5)
WBC: 13.7 10*3/uL — ABNORMAL HIGH (ref 4.0–10.5)
nRBC: 0.3 % — ABNORMAL HIGH (ref 0.0–0.2)

## 2019-04-13 LAB — GLUCOSE, CAPILLARY
Glucose-Capillary: 96 mg/dL (ref 70–99)
Glucose-Capillary: 149 mg/dL — ABNORMAL HIGH (ref 70–99)
Glucose-Capillary: 112 mg/dL — ABNORMAL HIGH (ref 70–99)
Glucose-Capillary: 144 mg/dL — ABNORMAL HIGH (ref 70–99)

## 2019-04-13 LAB — HEMOGLOBIN AND HEMATOCRIT, BLOOD
HCT: 27.3 % — ABNORMAL LOW (ref 39.0–52.0)
Hemoglobin: 8.2 g/dL — ABNORMAL LOW (ref 13.0–17.0)

## 2019-04-13 LAB — HEPARIN LEVEL (UNFRACTIONATED): Heparin Unfractionated: 0.14 IU/mL — ABNORMAL LOW (ref 0.30–0.70)

## 2019-04-13 LAB — TROPONIN I (HIGH SENSITIVITY): Troponin I (High Sensitivity): 246 ng/L (ref <18)

## 2019-04-13 LAB — LACTIC ACID, PLASMA: Lactic Acid, Venous: 0.9 mmol/L (ref 0.5–1.9)

## 2019-04-13 LAB — MAGNESIUM: Magnesium: 1.9 mg/dL (ref 1.7–2.4)

## 2019-04-13 MED ORDER — ASPIRIN 81 MG PO CHEW
81.0000 mg | CHEWABLE_TABLET | Freq: Every day | ORAL | Status: DC
  Administered 2019-04-13: 11:00:00 81 mg via ORAL
  Filled 2019-04-13: qty 1

## 2019-04-13 MED ORDER — ATORVASTATIN CALCIUM 80 MG PO TABS
80.0000 mg | ORAL_TABLET | Freq: Every day | ORAL | Status: DC
  Administered 2019-04-13: 11:00:00 80 mg via ORAL
  Filled 2019-04-13: qty 1

## 2019-04-13 MED ORDER — HEPARIN (PORCINE) 25000 UT/250ML-% IV SOLN
1100.0000 [IU]/h | INTRAVENOUS | Status: DC
  Administered 2019-04-13: 1000 [IU]/h via INTRAVENOUS
  Filled 2019-04-13 (×2): qty 250

## 2019-04-13 MED ORDER — CALCITRIOL 0.25 MCG PO CAPS
0.5000 ug | ORAL_CAPSULE | Freq: Every day | ORAL | Status: DC
  Administered 2019-04-13: 0.5 ug via ORAL
  Filled 2019-04-13: qty 2

## 2019-04-13 MED ORDER — PANTOPRAZOLE SODIUM 40 MG PO TBEC
40.0000 mg | DELAYED_RELEASE_TABLET | Freq: Every day | ORAL | Status: DC
  Administered 2019-04-13: 40 mg via ORAL
  Filled 2019-04-13: qty 1

## 2019-04-13 NOTE — Progress Notes (Signed)
SLP Cancellation Note  Patient Details Name: John Barton MRN: [id number] DOB: [birth year]   Cancelled treatment:       Reason Eval/Treat Not Completed: Other (comment) Order received for swallow evaluation. Per RN, pt passed a [facility] swallow screen and plan per MD is to start PO diet. SLP eval no longer wanted. Please reorder SLP if needed, otherwise will defer evaluation at this time.   Venita Sheffield Deondria Puryear 04/13/2019, 10:05 AM  Pollyann Glen, M.A. [place] Acute Environmental education officer [phone] Office [phone]

## 2019-04-13 NOTE — Progress Notes (Signed)
Pt has been hypotensive and SpO2 has been less than 90%. After changing to face mask, SpO2 is greater than 90%. Pt is transferring to room 21. Call report to 2 Heart RN.

## 2019-04-13 NOTE — Progress Notes (Signed)
[facility]    NEPHROLOGY PROGRESS NOTE  SUBJECTIVE: Patient seen and examined this morning in ICU prior to transfer.  No acute complaints.  Ongoing abdominal discomfort.  Awake and interactive.  Interval events noted.  OBJECTIVE:  Vitals:   04/13/19 1300 04/13/19 1311  BP: (!) 77/52 (!) 80/59  Pulse: (!) 50 (!) 54  Resp:    Temp:    SpO2:      Intake/Output Summary (Last 24 hours) at 04/13/2019 1325 Last data filed at 04/13/2019 0500 Gross per 24 hour  Intake 1334.26 ml  Output -  Net 1334.26 ml      General: Awake NAD, no acute complaints HEENT: MMM [place] AT anicteric sclera, orally intubated Neck:  No JVD, no adenopathy CV:  Heart RRR  Lungs:  L/S CTA bilaterally Abd:  abd marked distention with normal BS GU:  Bladder non-palpable Extremities: +2 bilateral lower extremity edema Skin:  No skin rash  MEDICATIONS:  . aspirin  81 mg Oral Daily  . atorvastatin  80 mg Oral Daily  . calcitRIOL  0.5 mcg Oral Daily  . chlorhexidine  15 mL Mouth Rinse BID  . Chlorhexidine Gluconate Cloth  6 each Topical Daily  . influenza vaccine adjuvanted  0.5 mL Intramuscular Tomorrow-1000  . insulin aspart  0-9 Units Subcutaneous TID WC  . mouth rinse  15 mL Mouth Rinse q12n4p  . mycophenolate  1,000 mg Oral BID  . pantoprazole  40 mg Oral Daily  . pneumococcal 23 valent vaccine  0.5 mL Intramuscular Tomorrow-1000  . sodium chloride flush  10-40 mL Intracatheter Q12H  . sodium chloride flush  10-40 mL Intracatheter Q12H  . Vitamin D (Ergocalciferol)  50,000 Units Oral Q Sun       LABS:   CBC Latest Ref Rng & Units 04/13/2019 04/12/2019 04/11/2019  WBC 4.0 - 10.5 K/uL 13.7(H) 16.0(H) 15.3(H)  Hemoglobin 13.0 - 17.0 g/dL 7.6(L) 7.9(L) 7.4(L)  Hematocrit 39.0 - 52.0 % 24.7(L) 25.2(L) 23.6(L)  Platelets 150 - 400 K/uL 136(L) 142(L) 132(L)    CMP Latest Ref Rng & Units 04/13/2019 04/12/2019 04/12/2019  Glucose 70 - 99 mg/dL 108(H) 113(H) 125(H)  BUN 8 - 23 mg/dL  55(H) 55(H) 60(H)  Creatinine 0.61 - 1.24 mg/dL 2.20(H) 2.41(H) 2.61(H)  Sodium 135 - 145 mmol/L 141 142 140  Potassium 3.5 - 5.1 mmol/L 5.0 4.6 4.7  Chloride 98 - 111 mmol/L 103 104 102  CO2 22 - 32 mmol/L 27 25 25   Calcium 8.9 - 10.3 mg/dL 6.8(L) 7.4(L) 7.1(L)  Total Protein 6.5 - 8.1 g/dL - - -  Total Bilirubin 0.3 - 1.2 mg/dL - - -  Alkaline Phos 38 - 126 U/L - - -  AST 15 - 41 U/L - - -  ALT 0 - 44 U/L - - -    Lab Results  Component Value Date   PTH 16 04/11/2019   CALCIUM 6.8 (L) 04/13/2019   CAION 0.97 (L) 04/11/2019   PHOS 5.4 (H) 04/13/2019       Component Value Date/Time   COLORURINE AMBER (A) 04/10/2019 1600   APPEARANCEUR CLEAR 04/10/2019 1600   LABSPEC 1.019 04/10/2019 1600   PHURINE 5.0 04/10/2019 1600   GLUCOSEU NEGATIVE 04/10/2019 1600   HGBUR NEGATIVE 04/10/2019 1600   BILIRUBINUR NEGATIVE 04/10/2019 1600   BILIRUBINUR Negative 11/17/2013   KETONESUR NEGATIVE 04/10/2019 1600   PROTEINUR 30 (A) 04/10/2019 1600   UROBILINOGEN 0.2 12/04/2008 1835   NITRITE NEGATIVE 04/10/2019 1600   LEUKOCYTESUR NEGATIVE  04/10/2019 1600      Component Value Date/Time   PHART 7.383 04/11/2019 1637   PCO2ART 42.8 04/11/2019 1637   PO2ART 195.0 (H) 04/11/2019 1637   HCO3 25.5 04/11/2019 1637   TCO2 27 04/11/2019 1637   O2SAT 100.0 04/11/2019 1637       Component Value Date/Time   FERRITIN 293 03/19/2019 1912       ASSESSMENT/PLAN:    1. AKI/CKD stage 3 presumably due to ischemic ATN in setting of volume depletion and hypotension.  1.  Recent renal transplant ultrasound with no evidence of obstruction or increased resistive indices. 2. Renal function improving.  Excellent urine output off of diuretics on dopamine. 2. Sepsis with hypotension- blood cultures negative to date, TTE with oscillating density in the mitral valve and awaiting TEE negative for vegetation. 1. Status post dapto and ceftriaxone per ID. 3. Diarrhea- resolved. 4. Hyperkalemia.  Likely due to  resorption of blood in retroperitoneum.  S/p insulin, D50, and Lokelma.  Immediately post cardiac arrest potassium within normal limits so not clear that this was the etiology of the arrest.  No further episodes noted of potassium elevation noted.  Continue to monitor closely, particularly if a heparin drip is to be restarted.   5. Metabolic acidosis- improved.  Off sodium bicarbonate. 6. Hematuria-Foley 7. Renal transplant from sister only taking cellcept.changed to myfortic due to diarrhea but changed back to cellcept which is what he has been on for years. 8. Hypocalcemia.  Vitamin D level markedly low.  Will replete.  Continue calcitriol.  Being supplemented IV with calcium gluconate as needed.   9. Status post cardiac arrest.  Question respiratory event.  Monitor potassium closely. 10. Disposition- when creat stable, can follow up with Dr. Jimmy Footman above. Gaylene Brooks, DO, FACP

## 2019-04-13 NOTE — Progress Notes (Signed)
NAME:  John Barton, MRN:  [id number], DOB:  1946-06-15, LOS: 44 ADMISSION DATE:  04/09/2019, CONSULTATION DATE: 04/06/19 REFERRING MD:  Dr. 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    Admitted on 10/10, febrile, getting treated for presumed endocarditis.   Cultures negative, TEE eventually was able to be done and was negative for vegetations.   Develop RP hemorrhage while on anticoagulation.  Had a cardiac arrest on 10/24 which is presumed secondary to hypokalemia from blood resorption. 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   Cultures negative, TEE eventually was able to be done and was negative for vegetations.    10/19 - Transferred to ICU 10/19 with RP hemorhage ( On coumadin at home, was supratheraputic, coumadin reversed, now was getting bridged back to ac with coumadin, had been on heparin infusion.  Developed hypotension with drop in H/H, CT abd done, large RP hematoma, heterogenous.  ) HGB Stabilized after 6 u prbc and 2 u ffp given rapidly. Reversed with protamine, also given Kcentra and vitamin K  04/08/2019  - Presumed endocarditis -since he is high risk, ID is recommending long-term ceftaroline with end date 05/15/2019. Transferred out of ICU 10/21  10/24: In Hospital cardiac arrest.Thought to be due to high K (frfom RP blood resoprtion)  despite K Rx  10/26-transfer out of ICU  Consults:  ID Nephrology Cardiology  Procedures:  10/19 Cordis RIJ > 10/22 10/22 > right IJ tunneled PICC 10/24 > R radial art line  Significant Diagnostic Tests:   TEE 04/18/2019 1. No vegetation seen  2. Left ventricular ejection fraction, by visual estimation, is 45 to 50%. The left ventricle has mildly decreased function. There is mildly increased left ventricular hypertrophy.  3. Global right ventricle has normal systolic function.The right ventricular size is normal.  4. Severe mitral annular calcification.  5. The mitral valve is degenerative. Mild to moderate mitral valve regurgitation.  6. The tricuspid valve is normal in structure. Tricuspid valve regurgitation is mild.  7. Mechanical prosthesis in the aortic valve position. No paravalvular leak  8. The pulmonic valve was not well visualized. Pulmonic valve regurgitation is not visualized by color flow Doppler.  CT chest 10/25-moderate bilateral pleural effusion, mild cardiomegaly, enlarged pulmonary artery.  Acute fractures of ribs.  I have reviewed the images personally  Micro Data:  10/9 Bl cx neg  Antimicrobials:  Ceftriaxone 10/14 > 10/20 Daptomycin 10/13 > 10/20 Cefepime 10/9-10/13  Ceftaroline 10/20 >>  Interim history/subjective:  Extubated yesterday with no issues Maintained on BiPAP overnight Complains of chest pain today morning.  Otherwise in no distress.  Objective   Blood pressure (!) 120/52, pulse 72, temperature (!) 97 F (36.1 C), temperature source Oral, resp. rate 15, height 5\' 10"  (1.778 m), weight 102.6 kg, SpO2 91 %.    Vent Mode: BIPAP;PCV FiO2 (%):  [40 %-60 %]  45 % Set Rate:  [12 bmp-18 bmp] 12 bmp Vt Set:  [580 mL] 580 mL PEEP:  [5 cmH20] 5 cmH20 Pressure Support:  [5 cmH20-10 cmH20] 10 cmH20 Plateau Pressure:  [17 cmH20] 17 cmH20   Intake/Output Summary (Last 24 hours) at 04/13/2019 0946 Last data filed at 04/13/2019 0500 Gross per 24  hour  Intake 1614.08 ml  Output -  Net 1614.08 ml   Filed Weights   04/11/19 0436 04/12/19 0500 04/13/19 0404  Weight: 107.6 kg 101.8 kg 102.6 kg   Blood pressure (!) 120/52, pulse 72, temperature (!) 97 F (36.1 C), temperature source Oral, resp. rate 15, height 5\' 10"  (1.778 m), weight 102.6 kg, SpO2 91 %. Gen:      No acute distress  HEENT:  EOMI, sclera anicteric Neck:     No masses; no thyromegaly Lungs:    Clear to auscultation bilaterally; normal respiratory effort CV:         Regular rate and rhythm; no murmurs Abd:      + bowel sounds; soft, non-tender; no palpable masses, no distension Ext:    No edema; adequate peripheral perfusion Skin:      Warm and dry; no rash Neuro: alert and oriented x 3  Resolved Hospital Problem list    Assessment & Plan:  Presumed endocarditis  (TEE 10/16 no endocarditis) Plan Continue ceftriaxone perID.  End date 11/27  RP  Hemorrhage 04/06/2019  In setting of anticoagulation for baseline mech aortic valve PLan Hemoglobin has remained stable We will need anticoagulation for his mechanical valve Discussed with pharmacy.  Will start targeting low range of anticoagulation Follow hemoglobin closely.   In-house cardiac arrest 04/11/2019 -  presumably vaso vagal and high K in setting low EF 45% + Long QTc 5102msec (seeon 10/21 and 10/24) Plan Telemetry monitoring  Acute hypoxic respiratory failure Plan Stable post extubation Wean down O2 as tolerated.  Combined systolic and diastolic heart failure CAD s/p CABG 1995 Mechanical aortic valve. - Echo 10/10, EF 45 to 50%, unchanged. - post arrest echo 10/24 - similar Plan Cardiology on board Continue aspirin, Lipitor Starting heparin.  AKI, s/p kidney transplant. - on cellcept Solitary Kidney CKD stage IIIb - -baseline cr: 1.3 Plan: Continue CellCept Monitor urine output and creatinine  BPH  Plan  Holding Flomax  Type 2 diabetes mellitus - A1c 6.6 in 2018 Plan: SSI  coverage.  We will transfer to progressive care and [facility] service.  PCCM off 10/27  Best practice:  Diet: Start p.o. diet Pain/Anxiety/Delirium protocol (if indicated): NA VAP protocol (if indicated): NA DVT prophylaxis: SCDs, heparin drip GI prophylaxis: PPI Glucose control: CBG Q 4, Mobility: BR  Code Status: Full Family Communication: Patient updated  Marshell Garfinkel MD [facility] Pager [phone] If no answer call 336 [phone] 04/13/2019, 9:47 AM

## 2019-04-13 NOTE — Progress Notes (Signed)
   Called to the bedside to see John Barton - he is noted to be somnolent - BP 75/52, HR 50 with infrequent PVC's, sinus brady. Was quite stable this morning - Heparin was started in the ICU for his AVR. D/w Dr. Vaughan Browner with PCCM, will send STAT H/H. Transfer back to ICU - consider restarting dopamine. Will ask EP as to their opinion regarding recurrent bradycardia and hypotension.  Pixie Casino, MD, [facility], [place] Director of the [facility] Lipid [facility] &  Cardiovascular Risk Reduction Clinic Diplomate of the American Board of Clinical Lipidology Attending Cardiologist  Direct Dial: [phone]  Fax: [phone]  Website:  [email or web address]

## 2019-04-13 NOTE — Progress Notes (Signed)
PT Cancellation Note  Patient Details Name: John Barton MRN: [id number] DOB: [birth year]   Cancelled Treatment:    Reason Eval/Treat Not Completed: Medical issues which prohibited therapy; noted breathing and BP/HR issues.  Will check back another day.   Reginia Naas 04/13/2019, 2:51 PM  Magda Kiel, [place] [phone] 04/13/2019

## 2019-04-13 NOTE — Progress Notes (Addendum)
Progress Note  Patient Name: John Barton Date of Encounter: 04/13/2019  Primary Cardiologist: No primary care provider on file.   Subjective  O/N Events: Stable troponemia. Weaned off dopamine overnight.\  He was examined and evaluated at bedside this AM. He was observed resting comfortably watching TV. Denies any chest pain, palpitations, dypsnea, light-headedness.  Inpatient Medications    Scheduled Meds:  aspirin  81 mg Per Tube Daily   atorvastatin  80 mg Per Tube Daily   [START ON 2019-05-02] calcitRIOL  0.5 mcg Per Tube Once per day on Tue Thu Sat   And   calcitRIOL  0.5 mcg Per Tube Once per day on Sun Mon Wed Fri   chlorhexidine  15 mL Mouth Rinse BID   Chlorhexidine Gluconate Cloth  6 each Topical Daily   influenza vaccine adjuvanted  0.5 mL Intramuscular Tomorrow-1000   insulin aspart  0-9 Units Subcutaneous TID WC   mouth rinse  15 mL Mouth Rinse q12n4p   mycophenolate  1,000 mg Oral BID   pantoprazole sodium  40 mg Per Tube Daily   pneumococcal 23 valent vaccine  0.5 mL Intramuscular Tomorrow-1000   sodium chloride flush  10-40 mL Intracatheter Q12H   sodium chloride flush  10-40 mL Intracatheter Q12H   Vitamin D (Ergocalciferol)  50,000 Units Oral Q Sun   Continuous Infusions:  sodium chloride Stopped (04/12/19 1343)   calcium gluconate     ceFTAROline (TEFLARO) IV Stopped (04/13/19 0459)   PRN Meds: sodium chloride, fentaNYL (SUBLIMAZE) injection, ipratropium-albuterol, lidocaine (PF), ondansetron **OR** ondansetron (ZOFRAN) IV, sodium chloride flush   Vital Signs    Vitals:   04/13/19 0300 04/13/19 0400 04/13/19 0404 04/13/19 0417  BP:    (!) 120/52  Pulse: 63 64  63  Resp:  20  16  Temp:   (!) 97.4 F (36.3 C)   TempSrc:   Axillary   SpO2: 98% 99%  99%  Weight:   102.6 kg   Height:        Intake/Output Summary (Last 24 hours) at 04/13/2019 0715 Last data filed at 04/13/2019 0500 Gross per 24 hour  Intake 1673.04 ml    Output 50 ml  Net 1623.04 ml   Filed Weights   04/11/19 0436 04/12/19 0500 04/13/19 0404  Weight: 107.6 kg 101.8 kg 102.6 kg    Telemetry    Normal sinus w/ intermittent bradycardia - Personally Reviewed  ECG    No ST changes. Sinus rhythm, left axis, fascicular block, - Personally Reviewed  Physical Exam   GEN: No acute distress.  HEENT: CPAP machine in place Neck: No JVD Cardiac: RRR, no murmurs, rubs, or gallops.  Respiratory: Clear to auscultation bilaterally. GI: Soft, nontender, non-distended  MS: Trace pitting edema; No deformity. Neuro:  Nonfocal  Psych: Normal affect   Labs    Chemistry Recent Labs  Lab 04/08/19 0332 04/09/19 0500  04/11/19 1322  04/12/19 0130 04/12/19 0518 04/13/19 0353  NA 138  --    < > 140   < > 140 142 141  K 4.8  --    < > 4.9   < > 4.7 4.6 5.0  CL 106  --    < > 103   < > 102 104 103  CO2 23  --    < > 22   < > 25 25 27   GLUCOSE 141*  --    < > 286*   < > 125* 113* 108*  BUN 34*  --    < >  59*   < > 60* 55* 55*  CREATININE 1.96*  --    < > 2.76*   < > 2.61* 2.41* 2.20*  CALCIUM 6.8*  --    < > 6.4*   < > 7.1* 7.4* 6.8*  PROT 5.2* 5.4*  --  5.2*  --   --   --   --   ALBUMIN 2.7* 2.9*  --  2.7*  --   --   --   --   AST 30 33  --  180*  --   --   --   --   ALT 20 24  --  154*  --   --   --   --   ALKPHOS 70 56  --  67  --   --   --   --   BILITOT 1.0 1.0  --  1.6*  --   --   --   --   GFRNONAA 32*  --    < > 21*   < > 23* 25* 28*  GFRAA 38*  --    < > 25*   < > 27* 29* 33*  ANIONGAP 9  --    < > 15   < > 13 13 11    < > = values in this interval not displayed.     Hematology Recent Labs  Lab 04/11/19 1714 04/12/19 0518 04/13/19 0353  WBC 15.3* 16.0* 13.7*  RBC 2.48* 2.66* 2.54*  HGB 7.4* 7.9* 7.6*  HCT 23.6* 25.2* 24.7*  MCV 95.2 94.7 97.2  MCH 29.8 29.7 29.9  MCHC 31.4 31.3 30.8  RDW 17.5* 18.0* 18.2*  PLT 132* 142* 136*    Cardiac EnzymesNo results for input(s): TROPONINI in the last 168 hours. No results for  input(s): TROPIPOC in the last 168 hours.   BNPNo results for input(s): BNP, PROBNP in the last 168 hours.   DDimer No results for input(s): DDIMER in the last 168 hours.   Radiology    Dg Abd 1 View  Result Date: 04/11/2019 CLINICAL DATA:  Enteric tube placement. EXAM: ABDOMEN - 1 VIEW COMPARISON:  Abdominal x-ray dated April 06, 2019. FINDINGS: Enteric tube looped in the stomach. New mild central small bowel dilatation. Air and stool within the colon. No acute osseous abnormality. No radio-opaque calculi or other significant radiographic abnormality are seen. IMPRESSION: 1. Enteric tube within the stomach. 2. New mild central small bowel dilatation may reflect ileus. Electronically Signed   By: Titus Dubin M.D.   On: 04/11/2019 15:02   Ct Chest Wo Contrast  Result Date: 04/12/2019 CLINICAL DATA:  Acute respiratory illness EXAM: CT CHEST WITHOUT CONTRAST TECHNIQUE: Multidetector CT imaging of the chest was performed following the standard protocol without IV contrast. COMPARISON:  Chest radiograph dated 04/11/19 FINDINGS: Cardiovascular: A right internal jugular central venous catheter tip terminates at the superior cavoatrial junction. The main pulmonary artery and the left and right pulmonary arteries are enlarged (main pulmonary artery measures 3.2 cm) and likely reflect pulmonary hypertension. Vascular calcifications are seen in the coronary arteries, aortic arch, and abdominal aorta. The heart is mildly enlarged. No pericardial effusion. Mediastinum/Nodes: Prominent paratracheal lymph nodes are likely reactive. No enlarged axillary lymph nodes. The thyroid appears normal. An endotracheal tube terminates in the upper thoracic trachea. Lungs/Pleura: There are moderate bilateral pleural effusions with associated atelectasis. There is no pneumothorax Upper Abdomen: An enteric tube terminates near the pylorus. A large fluid collection/hematoma along the left abdominal wall is  partially imaged.  The right kidney is trophic. Abdominal ascites is seen. Musculoskeletal: There are acute fractures of the anterior right second and fourth and the anterior left fourth and fifth ribs. Median sternotomy wires are noted. IMPRESSION: 1. Moderate bilateral pleural effusions with associated atelectasis. 2. Mild cardiomegaly. 3. Enlarged pulmonary arteries, suggesting pulmonary hypertension. 4. Large fluid collection/hematoma along the left abdominal wall, partially imaged. 5. Acute fractures of the anterior right second and fourth and the anterior left fourth and fifth ribs. Aortic Atherosclerosis (ICD10-I70.0). Electronically Signed   By: Zerita Boers M.D.   On: 04/12/2019 15:14   Dg Chest Port 1 View  Result Date: 04/11/2019 CLINICAL DATA:  Unresponsive, EXAM: PORTABLE CHEST 1 VIEW COMPARISON:  04/06/2019 FINDINGS: Endotracheal tube with the tip 2.5 cm above the carina. Bilateral diffuse mild interstitial thickening. No focal consolidation, pleural effusion or pneumothorax. Stable cardiomegaly. Prior median sternotomy. Right jugular central venous catheter with the tip projecting over the cavoatrial junction. IMPRESSION: 1. Bilateral mild interstitial thickening most concerning for mild interstitial edema. 2. Support lines and tubing in satisfactory position. Electronically Signed   By: Kathreen Devoid   On: 04/11/2019 13:47    Cardiac Studies   04/11/19 TTE IMPRESSIONS  1. Left ventricular ejection fraction, by visual estimation, is 45 to 50%. The left ventricle has mildly decreased function. There is not assessed.  2. Global right ventricle was not well visualized.The right ventricular size is normal. Right vetricular wall thickness was not assessed.  3. Left atrial size was mildly dilated.  4. Right atrial size was normal.  5. Severe mitral annular calcification.  6. The mitral valve is degenerative. Trace mitral valve regurgitation.  7. The tricuspid valve is not well visualized. Tricuspid valve  regurgitation is trivial.  8. Aortic valve regurgitation was not visualized by color flow Doppler.  9. Mechanical aortic prosthesis. Functioning normally. 10. The pulmonic valve was not well visualized. Pulmonic valve regurgitation is not visualized by color flow Doppler. 11. The inferior vena cava is dilated in size with <50% respiratory variability, suggesting right atrial pressure of 15 mmHg.  Patient Profile     73 y.o. 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 Cardiology consult after cardiac arrest.  Assessment & Plan   Post-Arrest Care PEA arrest - ROSC after 10 mins. No change in Echo post. Flat troponin ~240. Rib fractures found post arrest. No ischemic changes on EKG - Can stop trending troponins - Monitor vitals - Telemetry  Junctional Bradycardia AM EKG showing continuing bradycardia with PVCs. Weaned off dopamine. - Avoid AVN blocking meds - C/w telemetry - Replete K/Mag as appropriate - Need to monitor rhythm after acute illness resolves  Sepsis Blood culture negative to date. Currently on ceftaroline. Endocarditis r/o w/ TEE. Leukocytosis improving wbc 16->13.7. Not requiring pressors - Treat per PCCM  Mechanical Aortic Valve Development of retroperitoneal bleed after supratherapeutic INR on admission. On coumadin at home due to mechanical valve. Anemia stable. 7.4->7.9->7.6. Off anticoagulation since 10/20 - Need to restart anticoagulation (preferably max time off anticoag: 7 days) - Trend CBC, INR   For questions or updates, please contact [place] Please consult [email or web address] for contact info under Cardiology/STEMI.     Signed, Gilberto Better, MD PGY-2, [facility] Pager: [phone] 04/13/2019, 7:15 AM    Attending attestation to follow

## 2019-04-13 NOTE — Progress Notes (Signed)
ANTICOAGULATION CONSULT NOTE - Follow Up Consult  Pharmacy Consult for Heparin Indication: Afib, mechanical AVR   Patient Measurements: Height: 5\' 10"  (177.8 cm) Weight: 226 lb 3.1 oz (102.6 kg) IBW/kg (Calculated) : 73 Heparin dosing weight 86 kg  Vital Signs: Temp: 97.7 F (36.5 C) (10/26 1558) Temp Source: Oral (10/26 1558) BP: 74/53 (10/26 1900) Pulse Rate: 55 (10/26 1900)  Labs: Recent Labs    04/11/19 1714  04/12/19 0130 04/12/19 0518  04/12/19 1300 04/12/19 1953 04/13/19 0353 04/13/19 1321 04/13/19 1823  HGB 7.4*  --   --  7.9*  --   --   --  7.6* 8.2*  --   HCT 23.6*  --   --  25.2*  --   --   --  24.7* 27.3*  --   PLT 132*  --   --  142*  --   --   --  136*  --   --   LABPROT  --   --   --  16.0*  --   --   --   --   --   --   INR  --   --   --  1.3*  --   --   --   --   --   --   HEPARINUNFRC  --   --   --   --   --   --   --   --   --  0.14*  CREATININE 2.58*   < > 2.61* 2.41*  --   --   --  2.20*  --   --   TROPONINIHS 257*  --  271*  --    < > 263* 264* 246*  --   --    < > = values in this interval not displayed.    Estimated Creatinine Clearance: 34.8 mL/min (A) (by C-G formula based on SCr of 2.2 mg/dL (H)).  Assessment: 51 yom admitted 10/9 with INR of 2.4 on warfarin PTA (7 mg Tuesday and Thursday, 5 mg all other days per med rec). Patient has a mechanical aortic valve. Warfarin held on 10/13-10/15 and received 1x 5 mg dose of vitamin k on 10/15 so pt could get TEE.   Patient continued on IV heparin/warfarin until 10/18, when he had a RP bleed, and anticoagulation stopped and reversed.  CT showed Large fluid collection/hematoma along the left abdominal wall on 10/26 (stable). Has been off anticoagulation since 10/20. Hgb 7.6, plt 136. No new s/sx of bleeding. No infusion issues.   Initial hep lvl after restart 0.14  Goal of Therapy:  Heparin level lower end of therapeutic range (0.3-0.5) Monitor platelets by anticoagulation protocol: Yes  INR  2.5 to 3   Plan:  Slowly increase heparin to 1100 units/hr Next check 0200  Levester Fresh, PharmD, BCPS, BCCCP Clinical Pharmacist [phone]  Please check AMION for all [place] numbers  04/13/2019 7:18 PM

## 2019-04-13 NOTE — Progress Notes (Signed)
OT Cancellation Note  Patient Details Name: John Barton MRN: [id number] DOB: [birth year]   Cancelled Treatment:    Reason Eval/Treat Not Completed: Medical issues which prohibited therapy, per nursing on non re-breather, hypotensive, and plans to transfer back to ICU.  Will follow and see when medically stable and appropriate.   Delight Stare, OT Acute Rehabilitation Services Pager [phone] Office [phone]   Delight Stare 04/13/2019, 2:13 PM

## 2019-04-13 NOTE — Progress Notes (Signed)
Patient refuse NIV tonight. Patient states that he does not use one at home. Patient is clinically stable at this time no distress or complications noted.

## 2019-04-13 NOTE — Plan of Care (Signed)

## 2019-04-13 NOTE — Significant Event (Signed)
Rapid Response Event Note  Overview: Time Called: [id number] Arrival Time: [id number] Event Type: Hypotension  Initial Focused Assessment: Per staff upon arrival from [facility] patient was hypotensive and bradycardic.  Shortly after arrival he also became hypoxic and less responsive.  Upon my arrival he is on a NRB O2 sats 97% SB 50s  BP 83/54  RR 24 He is tired but easy to arouse  Interventions: IS 250, Cough and deep breath: complains of significant pain in his chest with inspiration because of "rib fracture" BP 70-80s  SB 55  RR 24  O2 sat 95% on 15L HFNC Spoke with Dr Donnella Sham ordered  Plan of Care (if not transferred):  Event Summary: Name of Physician Notified: Dr Vaughan Browner at    Name of Consulting Physician Notified: CCM at 1200  Outcome: Transferred (Comment)  Event End Time: [place]  Raliegh Ip

## 2019-04-13 NOTE — Progress Notes (Signed)
Oxygen sats are 82%. Non-re breather placed on pt. Respiratory at bedside. Pt still somnolent. Awakens to pain.

## 2019-04-13 NOTE — Progress Notes (Signed)
CRITICAL VALUE ALERT  Critical Value:  Calcium 6.8   Date & Time Notied:  04/13/19 @ 0510  Provider Notified: Elink (CCM)  Orders Received/Actions taken: No new orders at this time

## 2019-04-13 NOTE — Progress Notes (Signed)
ANTICOAGULATION CONSULT NOTE - Follow Up Consult  Pharmacy Consult for Heparin Indication: Afib, mechanical AVR   Patient Measurements: Height: 5\' 10"  (177.8 cm) Weight: 226 lb 3.1 oz (102.6 kg) IBW/kg (Calculated) : 73 Heparin dosing weight 86 kg  Vital Signs: Temp: 97 F (36.1 C) (10/26 0815) Temp Source: Oral (10/26 0815) BP: 120/52 (10/26 0417) Pulse Rate: 63 (10/26 0417)  Labs: Recent Labs    04/11/19 1714  04/12/19 0130 04/12/19 0518  04/12/19 1300 04/12/19 1953 04/13/19 0353  HGB 7.4*  --   --  7.9*  --   --   --  7.6*  HCT 23.6*  --   --  25.2*  --   --   --  24.7*  PLT 132*  --   --  142*  --   --   --  136*  LABPROT  --   --   --  16.0*  --   --   --   --   INR  --   --   --  1.3*  --   --   --   --   CREATININE 2.58*   < > 2.61* 2.41*  --   --   --  2.20*  TROPONINIHS 257*  --  271*  --    < > 263* 264* 246*   < > = values in this interval not displayed.    Estimated Creatinine Clearance: 34.8 mL/min (A) (by C-G formula based on SCr of 2.2 mg/dL (H)).  Assessment: 65 yom admitted 10/9 with INR of 2.4 on warfarin PTA (7 mg Tuesday and Thursday, 5 mg all other days per med rec). Patient has a mechanical aortic valve. Warfarin held on 10/13-10/15 and received 1x 5 mg dose of vitamin k on 10/15 so pt could get TEE.   Patient continued on IV heparin/warfarin until 10/18, when he had a RP bleed, and anticoagulation stopped and reversed.  CT showed Large fluid collection/hematoma along the left abdominal wall on 10/26 (stable). Has been off anticoagulation since 10/20. Hgb 7.6, plt 136. No new s/sx of bleeding. No infusion issues.   Goal of Therapy:  Heparin level lower end of therapeutic range (0.3-0.5) Monitor platelets by anticoagulation protocol: Yes  INR 2.5 to 3   Plan:  -Start heparin infusion at 1000 units/hr without bolus -Obtain heparin level in 8 hr -Monitor daily HL, CBC, and for s/sx of bleeding  -F/u warfarin restart   Antonietta Jewel, PharmD,  BCCCP Clinical Pharmacist  Phone: [phone]  Please check AMION for all [place] phone numbers After 10:00 PM, call [place] [phone]

## 2019-04-13 NOTE — Progress Notes (Signed)
PCCM progress note  Called to the bedside.  Patient got transferred out of ICU today morning as he was stable and look good.   But has become bradycardic and hypotensive since arrival to progressive care unit Appears more somnolent  Heparin drip started at 10:00 AM today morning Given instability we will transfer him back to to heart for close monitoring Check stat H&H Consider dopamine drip as he responded to this over the weekend  Discussed with Dr. Debara Pickett, cardiology.  May need EP evaluation at some point for recurrent bradycardia  The patient is critically ill with multiple organ system failure and requires high complexity decision making for assessment and support, frequent evaluation and titration of therapies, advanced monitoring, review of radiographic studies and interpretation of complex data.   Critical Care Time devoted to patient care services, exclusive of separately billable procedures, described in this note is 35 minutes.   Marshell Garfinkel MD [facility] Pager [phone] If no answer call [phone] 04/13/2019, 1:24 PM

## 2019-04-14 ENCOUNTER — Inpatient Hospital Stay (HOSPITAL_COMMUNITY): Payer: Medicare Other

## 2019-04-14 DIAGNOSIS — I38 Endocarditis, valve unspecified: Secondary | ICD-10-CM

## 2019-04-14 DIAGNOSIS — I495 Sick sinus syndrome: Secondary | ICD-10-CM

## 2019-04-14 DIAGNOSIS — J9600 Acute respiratory failure, unspecified whether with hypoxia or hypercapnia: Secondary | ICD-10-CM

## 2019-04-14 LAB — GLUCOSE, CAPILLARY
Glucose-Capillary: 130 mg/dL — ABNORMAL HIGH (ref 70–99)
Glucose-Capillary: 119 mg/dL — ABNORMAL HIGH (ref 70–99)

## 2019-04-14 LAB — POCT I-STAT 7, (LYTES, BLD GAS, ICA,H+H)
Bicarbonate: 27.1 mmol/L (ref 20.0–28.0)
Calcium, Ion: 0.97 mmol/L — ABNORMAL LOW (ref 1.15–1.40)
HCT: 24 % — ABNORMAL LOW (ref 39.0–52.0)
Hemoglobin: 8.2 g/dL — ABNORMAL LOW (ref 13.0–17.0)
O2 Saturation: 82 %
Potassium: 5.7 mmol/L — ABNORMAL HIGH (ref 3.5–5.1)
Sodium: 138 mmol/L (ref 135–145)
TCO2: 29 mmol/L (ref 22–32)
pCO2 arterial: 60 mmHg — ABNORMAL HIGH (ref 32.0–48.0)
pH, Arterial: 7.263 — ABNORMAL LOW (ref 7.350–7.450)
pO2, Arterial: 54 mmHg — ABNORMAL LOW (ref 83.0–108.0)

## 2019-04-14 LAB — BASIC METABOLIC PANEL
Anion gap: 13 (ref 5–15)
Anion gap: 14 (ref 5–15)
BUN: 59 mg/dL — ABNORMAL HIGH (ref 8–23)
BUN: 62 mg/dL — ABNORMAL HIGH (ref 8–23)
CO2: 24 mmol/L (ref 22–32)
CO2: 25 mmol/L (ref 22–32)
Calcium: 6.8 mg/dL — ABNORMAL LOW (ref 8.9–10.3)
Calcium: 7 mg/dL — ABNORMAL LOW (ref 8.9–10.3)
Chloride: 102 mmol/L (ref 98–111)
Chloride: 102 mmol/L (ref 98–111)
Creatinine, Ser: 3.23 mg/dL — ABNORMAL HIGH (ref 0.61–1.24)
Creatinine, Ser: 3.33 mg/dL — ABNORMAL HIGH (ref 0.61–1.24)
GFR calc Af Amer: 21 mL/min — ABNORMAL LOW (ref >60)
GFR calc Af Amer: 20 mL/min — ABNORMAL LOW (ref >60)
GFR calc non Af Amer: 18 mL/min — ABNORMAL LOW (ref >60)
GFR calc non Af Amer: 17 mL/min — ABNORMAL LOW (ref >60)
Glucose, Bld: 139 mg/dL — ABNORMAL HIGH (ref 70–99)
Glucose, Bld: 135 mg/dL — ABNORMAL HIGH (ref 70–99)
Potassium: 6.5 mmol/L (ref 3.5–5.1)
Potassium: 5.9 mmol/L — ABNORMAL HIGH (ref 3.5–5.1)
Sodium: 139 mmol/L (ref 135–145)
Sodium: 141 mmol/L (ref 135–145)

## 2019-04-14 LAB — CBC
HCT: 26.1 % — ABNORMAL LOW (ref 39.0–52.0)
Hemoglobin: 7.6 g/dL — ABNORMAL LOW (ref 13.0–17.0)
MCH: 29.1 pg (ref 26.0–34.0)
MCHC: 29.1 g/dL — ABNORMAL LOW (ref 30.0–36.0)
MCV: 100 fL (ref 80.0–100.0)
Platelets: 116 10*3/uL — ABNORMAL LOW (ref 150–400)
RBC: 2.61 MIL/uL — ABNORMAL LOW (ref 4.22–5.81)
RDW: 18.5 % — ABNORMAL HIGH (ref 11.5–15.5)
WBC: 13.4 10*3/uL — ABNORMAL HIGH (ref 4.0–10.5)
nRBC: 1 % — ABNORMAL HIGH (ref 0.0–0.2)

## 2019-04-14 LAB — PHOSPHORUS: Phosphorus: 7.9 mg/dL — ABNORMAL HIGH (ref 2.5–4.6)

## 2019-04-14 LAB — MAGNESIUM: Magnesium: 2 mg/dL (ref 1.7–2.4)

## 2019-04-14 LAB — HEPARIN LEVEL (UNFRACTIONATED)
Heparin Unfractionated: 0.35 IU/mL (ref 0.30–0.70)
Heparin Unfractionated: 0.22 IU/mL — ABNORMAL LOW (ref 0.30–0.70)

## 2019-04-14 MED ORDER — FUROSEMIDE 10 MG/ML IJ SOLN
40.0000 mg | Freq: Once | INTRAMUSCULAR | Status: AC
  Administered 2019-04-14: 40 mg via INTRAVENOUS
  Filled 2019-04-14: qty 4

## 2019-04-14 MED ORDER — PRISMASOL BGK 4/2.5 32-4-2.5 MEQ/L IV SOLN: INTRAVENOUS | Status: DC

## 2019-04-14 MED ORDER — ACETAMINOPHEN 325 MG PO TABS: 650.0000 mg | ORAL_TABLET | Freq: Four times a day (QID) | ORAL | Status: DC | PRN

## 2019-04-14 MED ORDER — POLYVINYL ALCOHOL 1.4 % OP SOLN
1.0000 [drp] | Freq: Four times a day (QID) | OPHTHALMIC | Status: DC | PRN
  Filled 2019-04-14: qty 15

## 2019-04-14 MED ORDER — DOPAMINE-DEXTROSE 3.2-5 MG/ML-% IV SOLN
2.0000 ug/kg/min | INTRAVENOUS | Status: DC
  Administered 2019-04-14: 3 ug/kg/min via INTRAVENOUS

## 2019-04-14 MED ORDER — DIPHENHYDRAMINE HCL 50 MG/ML IJ SOLN: 25.0000 mg | INTRAMUSCULAR | Status: DC | PRN

## 2019-04-14 MED ORDER — LORAZEPAM 2 MG/ML IJ SOLN: 2.0000 mg | INTRAMUSCULAR | Status: DC | PRN

## 2019-04-14 MED ORDER — ACETAMINOPHEN 650 MG RE SUPP: 650.0000 mg | Freq: Four times a day (QID) | RECTAL | Status: DC | PRN

## 2019-04-14 MED ORDER — PRISMASOL BGK 4/2.5 32-4-2.5 MEQ/L REPLACEMENT SOLN: Status: DC

## 2019-04-14 MED ORDER — CHLORHEXIDINE GLUCONATE 0.12 % MT SOLN: 15.0000 mL | Freq: Two times a day (BID) | OROMUCOSAL | Status: DC

## 2019-04-14 MED ORDER — GLYCOPYRROLATE 1 MG PO TABS
1.0000 mg | ORAL_TABLET | ORAL | Status: DC | PRN
  Filled 2019-04-14: qty 1

## 2019-04-14 MED ORDER — ORAL CARE MOUTH RINSE: 15.0000 mL | Freq: Two times a day (BID) | OROMUCOSAL | Status: DC

## 2019-04-14 MED ORDER — FUROSEMIDE 10 MG/ML IJ SOLN: 60.0000 mg | Freq: Two times a day (BID) | INTRAMUSCULAR | Status: DC

## 2019-04-14 MED ORDER — INSULIN ASPART 100 UNIT/ML IV SOLN
10.0000 [IU] | Freq: Once | INTRAVENOUS | Status: AC
  Administered 2019-04-14: 10 [IU] via INTRAVENOUS

## 2019-04-14 MED ORDER — NOREPINEPHRINE 4 MG/250ML-% IV SOLN
0.0000 ug/min | INTRAVENOUS | Status: DC
  Administered 2019-04-14: 10:00:00 2 ug/min via INTRAVENOUS
  Filled 2019-04-14: qty 250

## 2019-04-14 MED ORDER — GLYCOPYRROLATE 0.2 MG/ML IJ SOLN: 0.2000 mg | INTRAMUSCULAR | Status: DC | PRN

## 2019-04-14 MED ORDER — MORPHINE 100MG IN NS 100ML (1MG/ML) PREMIX INFUSION
0.0000 mg/h | INTRAVENOUS | Status: DC
  Administered 2019-04-14: 5 mg/h via INTRAVENOUS
  Filled 2019-04-14: qty 100

## 2019-04-14 MED ORDER — CALCIUM GLUCONATE-NACL 2-0.675 GM/100ML-% IV SOLN
2.0000 g | Freq: Once | INTRAVENOUS | Status: AC
  Administered 2019-04-14: 06:00:00 2000 mg via INTRAVENOUS
  Filled 2019-04-14: qty 100

## 2019-04-14 MED ORDER — HEPARIN SODIUM (PORCINE) 1000 UNIT/ML DIALYSIS: 1000.0000 [IU] | INTRAMUSCULAR | Status: DC | PRN

## 2019-04-14 MED ORDER — DOPAMINE-DEXTROSE 3.2-5 MG/ML-% IV SOLN
INTRAVENOUS | Status: AC
  Filled 2019-04-14: qty 250

## 2019-04-14 MED ORDER — MORPHINE SULFATE (PF) 2 MG/ML IV SOLN: 2.0000 mg | INTRAVENOUS | Status: DC | PRN

## 2019-04-14 MED ORDER — DEXTROSE 50 % IV SOLN
1.0000 | Freq: Once | INTRAVENOUS | Status: AC
  Administered 2019-04-14: 06:00:00 50 mL via INTRAVENOUS
  Filled 2019-04-14: qty 50

## 2019-04-14 MED ORDER — SODIUM BICARBONATE 8.4 % IV SOLN
50.0000 meq | Freq: Once | INTRAVENOUS | Status: AC
  Administered 2019-04-14: 06:00:00 50 meq via INTRAVENOUS

## 2019-04-14 MED ORDER — MORPHINE SULFATE (PF) 2 MG/ML IV SOLN
2.0000 mg | INTRAVENOUS | Status: DC | PRN
  Administered 2019-04-14: 18:00:00 4 mg via INTRAVENOUS
  Administered 2019-04-14: 2 mg via INTRAVENOUS
  Filled 2019-04-14: qty 2
  Filled 2019-04-14: qty 1

## 2019-04-14 MED ORDER — GLYCOPYRROLATE 0.2 MG/ML IJ SOLN
0.2000 mg | INTRAMUSCULAR | Status: DC | PRN
  Filled 2019-04-14: qty 1

## 2019-04-14 MED ORDER — MORPHINE BOLUS VIA INFUSION
5.0000 mg | INTRAVENOUS | Status: DC | PRN
  Filled 2019-04-14: qty 5

## 2019-04-14 MED ORDER — SODIUM CHLORIDE 0.9 % FOR CRRT: INTRAVENOUS_CENTRAL | Status: DC | PRN

## 2019-04-17 ENCOUNTER — Telehealth: Payer: Self-pay

## 2019-04-17 NOTE — Telephone Encounter (Signed)
Received dc from [employer].  DC is for cremation and a patient of Doctor Mannam.  DC will be taken to Pulmonary Unit for signature.  On 04/17/2019 Received signed dc back from Doctor Mannam.  I called the funeral home to let them know the dc was faxed to the funeral home per their request.

## 2019-04-17 NOTE — Telephone Encounter (Signed)
Received dc from [employer].  DC is for cremation and a patient of Doctor Mannam.   DC will be taken to Pulmonary Unit for signature.

## 2019-04-19 NOTE — Progress Notes (Signed)
NAME:  John Barton, MRN:  [id number], DOB:  Sep 07, 1945, LOS: 59 ADMISSION DATE:  04/17/2019, CONSULTATION DATE: 04/06/19 REFERRING MD:  Dr. 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    Admitted on 10/10, febrile, getting treated for presumed endocarditis.   Cultures negative, TEE eventually was able to be done and was negative for vegetations.   Develop RP hemorrhage while on anticoagulation.  Had a cardiac arrest on 10/24 which is presumed secondary to hyperkalemia from blood resorption. Extubated 10/25.    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   Cultures negative, TEE eventually was able to be done and was negative for vegetations.    10/19 - Transferred to ICU 10/19 with RP hemorhage ( On coumadin at home, was supratheraputic, coumadin reversed, now was getting bridged back to ac with coumadin, had been on heparin infusion.  Developed hypotension with drop in H/H, CT abd done, large RP hematoma, heterogenous.  ) HGB Stabilized after 6 u prbc and 2 u ffp given rapidly. Reversed with protamine, also given Kcentra and vitamin K  04/08/2019  - Presumed endocarditis -since he is high risk, ID is recommending long-term ceftaroline with end date 05/15/2019. Transferred out of ICU 10/21  10/24: In Hospital cardiac arrest.Thought to be due to high K (frfom RP blood  resoprtion) despite K Rx  10/26-transfer out of ICU then came back hypotensive and bradycardic on low dose dopamine   Consults:  ID Nephrology Cardiology  Procedures:  10/19 Cordis RIJ > 10/22 10/22 > right IJ tunneled PICC 10/24 > R radial art line > 10/25 10/24 ETT >> 10/25  Significant Diagnostic Tests:  10/10 TTE >>   1. Left ventricular ejection fraction, by visual estimation, is 45 to 50%. The left ventricle has mildly decreased function. Mildly increased left ventricular size. There is moderately increased left ventricular hypertrophy.  2. Elevated mean left atrial pressure.  3. Left ventricular diastolic function could not be evaluated pattern of LV diastolic filling.  4. Global right ventricle has normal systolic function.The right ventricular size is normal.  5. Left atrial size was moderately dilated.  6. Right atrial size was normal.  7. Severe mitral annular calcification.  8. The mitral valve is abnormal. No evidence of mitral valve regurgitation. No evidence of mitral stenosis.  9. The tricuspid valve is normal in structure. Tricuspid valve regurgitation is trivial. 10. Aortic valve regurgitation was not visualized by color flow Doppler. 11. The pulmonic valve was not well visualized. Pulmonic valve regurgitation is not visualized by color flow Doppler. 12. The inferior vena cava is dilated in size with >50% respiratory variability, suggesting right atrial pressure of 8 mmHg. 13. Technically difficult; mild global reduction in LV systolic function; moderate LVH; mild LVE; s/p AVR with mean gradient of 18 mmHg and no AI; severe MAC; possible oscillating density associated with MV; suggest TEE to  further assess; moderate LAE.  TEE 03/22/2019 1. No vegetation seen  2. Left ventricular ejection fraction, by visual estimation, is 45 to 50%. The left ventricle has mildly decreased function. There is mildly increased left ventricular hypertrophy.  3. Global right ventricle has  normal systolic function.The right ventricular size is normal.  4. Severe mitral annular calcification.  5. The mitral valve is degenerative. Mild to moderate mitral valve regurgitation.  6. The tricuspid valve is normal in structure. Tricuspid valve regurgitation is mild.  7. Mechanical prosthesis in the aortic valve position. No paravalvular leak  8. The pulmonic valve was not well visualized. Pulmonic valve regurgitation is not visualized by color flow Doppler.  10/24 TTE >> 1. Left ventricular ejection fraction, by visual estimation, is 45 to 50%. The left ventricle has mildly decreased function. There is not assessed.  2. Global right ventricle was not well visualized.The right ventricular size is normal. Right vetricular wall thickness was not assessed.  3. Left atrial size was mildly dilated.  4. Right atrial size was normal.  5. Severe mitral annular calcification.  6. The mitral valve is degenerative. Trace mitral valve regurgitation.  7. The tricuspid valve is not well visualized. Tricuspid valve regurgitation is trivial.  8. Aortic valve regurgitation was not visualized by color flow Doppler.  9. Mechanical aortic prosthesis. Functioning normally. 10. The pulmonic valve was not well visualized. Pulmonic valve regurgitation is not visualized by color flow Doppler. 11. The inferior vena cava is dilated in size with <50% respiratory variability, suggesting right atrial pressure of 15 mmHg.  CT chest 10/25-moderate bilateral pleural effusion, mild cardiomegaly, enlarged pulmonary artery.  Acute fractures of ribs.  Micro Data:  10/9 SARS CoV2 >> neg 10/9 BCx 2 >> neg  10/10 MRSA PCR >> neg 10/11 Cdiff >> neg 10/09 UC >> insignificant growth 10/11 GI panel >> neg  10/27 BC x 2 >> 10/27 UC >>  Antimicrobials:  Ceftriaxone 10/14 > 10/20 Daptomycin 10/13 > 10/20 Cefepime 10/9-10/13  Ceftaroline 10/20 >>   (11/27 scheduled end date)  Interim history/subjective:  Worsening  overnight, requiring BiPAP for pulmonary edema tmax 98.2 On dopamine 2 mcg/min Remains on heparin gtt No UOP despite lasix    Objective   Blood pressure (!) 144/67, pulse 67, temperature 98.2 F (36.8 C), temperature source Oral, resp. rate 19, height _0  (1.778 m), weight 106 kg, SpO2 98 %.    Vent Mode: BIPAP;PCV FiO2 (%):  [100 %] 100 % Set Rate:  [16 bmp] 16 bmp PEEP:  [8 cmH20] 8 cmH20   Intake/Output Summary (Last 24 hours) at 2019-05-01 0902 Last data filed at 05/01/2019 0830 Gross per 24 hour  Intake 800.16 ml  Output 175 ml  Net 625.16 ml   Filed Weights   04/12/19 0500 04/13/19 0404 05/01/19 0233  Weight: 101.8 kg 102.6 kg 106 kg   Blood pressure (!) 120/52, pulse 72, temperature (!) 97 F (36.1 C), temperature source Oral, resp. rate 15, height _1  (1.778 m), weight 102.6 kg, SpO2 97 %.  General:  Elderly male comfortable appearing on BiPAP HEENT: full face mask- leaks  Neuro:  Will awaken to voice, f/c, MAE CV: rr, ir,  PULM:  Non labored on BiPAP- getting good TV >600, clear anteriorly, diminished in bases  GI: abd taunt but not firm, does not appear tender, +bs Extremities: warm/dry,+2-3 LE edema, +1-2 upper extremity edema  Skin: no rashes   Resolved Hospital Problem list    Assessment & Plan:  Acute hypoxic respiratory failure Acute pulmonary edema Rib fx from prior CPR P:  Mr Mannam clarified [place] with patient and his daughter who discussed with her aunt (patient's sister).  Patient is now a DNR / DNI given his poor overall prognosis is very guarded and family wishes to focus on comfort. No augmentation of care, likely to transition to full comfort care if further decompensates (suspect he will) Supplemental O2 for comfort No BiPAP/ intubation  Morphine PRN pain/ dyspnea  Presumed endocarditis  (TEE 10/16 no endocarditis) P:  Continuing ceftaroline (end date 11/27)  RP Hemorrhage 04/06/2019  In setting of anticoagulation for baseline mech  aortic valve P:  H/H stable  In-house cardiac arrest 04/11/2019 -  presumably vaso vagal and high K in setting low EF 45% + Long QTc 51mec (seen 10/21 and 10/24) P:  Likely to de-compensate, will continue current therapy for now with no escalation of care  Combined systolic and diastolic heart failure CAD s/p CABG 1995 Mechanical aortic valve Bradycardia/ hypotension - Echo 10/24 - EF 45 to 50%, unchanged. P:  Cards/ EP following  Will not restart heparin gtt Continue aspirin, Lipitor Lasix per Cards    Worsening AKI, s/p kidney transplant. - on cellcept Solitary Kidney CKD stage IIIb - -baseline cr: 1.3 P: Worsening sCr/ oliguric with hyperkalemia s/p chemical stabilization and pulmonary edema with acute on chronic heart failure.  Would do CRRT however GOC have changed and will continue current course of treatment with lasix only Continue CellCept Monitor urine output and creatinine May need I/O for comfort  BPH  P: Holding Flomax  Type 2 diabetes mellitus P:  SSI sensitive - glucose controlled    Best practice:  Diet: NPO Pain/Anxiety/Delirium protocol (if indicated): NA VAP protocol (if indicated): NA DVT prophylaxis: SCDs GI prophylaxis: PPI Glucose control: CBG Q 4 Mobility: BR  Code Status: DNR/ DNI, per conversations by Dr. MVaughan Brownerwith patient and daughter Family Communication: see above  CCT 40 mins  BKennieth Rad MSN, AGACNP-BC [facility] 110/30/2020 9:02 AM

## 2019-04-19 NOTE — Progress Notes (Signed)
Progress Note  Patient Name: John Barton Date of Encounter: 05-10-2019  Primary Cardiologist: No primary care provider on file.   Subjective  O/N Events: Hypotension and hypoxic event requiring transfer back to [place]. Continues to be hypoxic w/ CXR showing pulm edema. Hyperkalemia treated w/ insulin & lasix. Restarted on dopamine.  He was examined and evaluated at bedside this AM. He was observed in respiratory distress with RT at bedside. He mentions no complaints this am but appear somnolent.  Inpatient Medications    Scheduled Meds: . aspirin  81 mg Oral Daily  . atorvastatin  80 mg Oral Daily  . calcitRIOL  0.5 mcg Oral Daily  . chlorhexidine  15 mL Mouth Rinse BID  . Chlorhexidine Gluconate Cloth  6 each Topical Daily  . influenza vaccine adjuvanted  0.5 mL Intramuscular Tomorrow-1000  . insulin aspart  0-9 Units Subcutaneous TID WC  . mouth rinse  15 mL Mouth Rinse q12n4p  . mycophenolate  1,000 mg Oral BID  . pantoprazole  40 mg Oral Daily  . pneumococcal 23 valent vaccine  0.5 mL Intramuscular Tomorrow-1000  . sodium chloride flush  10-40 mL Intracatheter Q12H  . sodium chloride flush  10-40 mL Intracatheter Q12H  . Vitamin D (Ergocalciferol)  50,000 Units Oral Q Sun   Continuous Infusions: . sodium chloride Stopped (04/12/19 1343)  . calcium gluconate 2,000 mg (May 10, 2019 0610)  . ceFTAROline (TEFLARO) IV Stopped (05-10-19 0052)  . DOPamine 4 mcg/kg/min (05/10/19 0600)  . heparin 1,100 Units/hr (05-10-2019 0600)   PRN Meds: sodium chloride, fentaNYL (SUBLIMAZE) injection, ipratropium-albuterol, lidocaine (PF), ondansetron **OR** ondansetron (ZOFRAN) IV, sodium chloride flush   Vital Signs    Vitals:   May 10, 2019 0530 2019-05-10 0545 05-10-2019 0600 May 10, 2019 0615  BP: 128/70 115/62 126/70 137/62  Pulse: 65 64 65 (!) 58  Resp: 18 20 17 15   Temp:      TempSrc:      SpO2: 98% 97% 97% 95%  Weight:      Height:        Intake/Output Summary (Last 24 hours) at  05-10-19 0708 Last data filed at 05/10/2019 0610 Gross per 24 hour  Intake 707.88 ml  Output 175 ml  Net 532.88 ml   Filed Weights   04/12/19 0500 04/13/19 0404 2019-05-10 0233  Weight: 101.8 kg 102.6 kg 106 kg    Telemetry    Sinus bradycardia - Personally Reviewed  ECG   Am tracing pending  - Personally Reviewed  Physical Exam   Gen: Well-developed, well nourished, appear agitated, somnolent HEENT: NCAT head, hearing intact, EOMI, Re-breather in place Neck: supple, ROM intact, no JVD CV: Bradycardic, S1, S2 normal Pulm: Coarse breath sounds with rales Abd: Soft, BS+, NTND Extm: ROM intact, Peripheral pulses intact, 1+ pitting edema Skin: Dry, Warm, normal turgor  Neuro: Agitated  Labs    Chemistry Recent Labs  Lab 04/08/19 0332 04/09/19 0500  04/11/19 1322  04/12/19 0518 04/13/19 0353 10-May-2019 0230  NA 138  --    < > 140   < > 142 141 139  K 4.8  --    < > 4.9   < > 4.6 5.0 6.5*  CL 106  --    < > 103   < > 104 103 102  CO2 23  --    < > 22   < > 25 27 24   GLUCOSE 141*  --    < > 286*   < > 113* 108* 139*  BUN 34*  --    < > 59*   < > 55* 55* 59*  CREATININE 1.96*  --    < > 2.76*   < > 2.41* 2.20* 3.23*  CALCIUM 6.8*  --    < > 6.4*   < > 7.4* 6.8* 6.8*  PROT 5.2* 5.4*  --  5.2*  --   --   --   --   ALBUMIN 2.7* 2.9*  --  2.7*  --   --   --   --   AST 30 33  --  180*  --   --   --   --   ALT 20 24  --  154*  --   --   --   --   ALKPHOS 70 56  --  67  --   --   --   --   BILITOT 1.0 1.0  --  1.6*  --   --   --   --   GFRNONAA 32*  --    < > 21*   < > 25* 28* 18*  GFRAA 38*  --    < > 25*   < > 29* 33* 21*  ANIONGAP 9  --    < > 15   < > 13 11 13    < > = values in this interval not displayed.     Hematology Recent Labs  Lab 04/12/19 0518 04/13/19 0353 04/13/19 1321 May 08, 2019 0230  WBC 16.0* 13.7*  --  13.4*  RBC 2.66* 2.54*  --  2.61*  HGB 7.9* 7.6* 8.2* 7.6*  HCT 25.2* 24.7* 27.3* 26.1*  MCV 94.7 97.2  --  100.0  MCH 29.7 29.9  --  29.1   MCHC 31.3 30.8  --  29.1*  RDW 18.0* 18.2*  --  18.5*  PLT 142* 136*  --  116*    Cardiac EnzymesNo results for input(s): TROPONINI in the last 168 hours. No results for input(s): TROPIPOC in the last 168 hours.   BNPNo results for input(s): BNP, PROBNP in the last 168 hours.   DDimer No results for input(s): DDIMER in the last 168 hours.   Radiology    Ct Chest Wo Contrast  Result Date: 04/12/2019 CLINICAL DATA:  Acute respiratory illness EXAM: CT CHEST WITHOUT CONTRAST TECHNIQUE: Multidetector CT imaging of the chest was performed following the standard protocol without IV contrast. COMPARISON:  Chest radiograph dated 04/11/19 FINDINGS: Cardiovascular: A right internal jugular central venous catheter tip terminates at the superior cavoatrial junction. The main pulmonary artery and the left and right pulmonary arteries are enlarged (main pulmonary artery measures 3.2 cm) and likely reflect pulmonary hypertension. Vascular calcifications are seen in the coronary arteries, aortic arch, and abdominal aorta. The heart is mildly enlarged. No pericardial effusion. Mediastinum/Nodes: Prominent paratracheal lymph nodes are likely reactive. No enlarged axillary lymph nodes. The thyroid appears normal. An endotracheal tube terminates in the upper thoracic trachea. Lungs/Pleura: There are moderate bilateral pleural effusions with associated atelectasis. There is no pneumothorax Upper Abdomen: An enteric tube terminates near the pylorus. A large fluid collection/hematoma along the left abdominal wall is partially imaged. The right kidney is trophic. Abdominal ascites is seen. Musculoskeletal: There are acute fractures of the anterior right second and fourth and the anterior left fourth and fifth ribs. Median sternotomy wires are noted. IMPRESSION: 1. Moderate bilateral pleural effusions with associated atelectasis. 2. Mild cardiomegaly. 3. Enlarged pulmonary arteries, suggesting pulmonary hypertension. 4.  Large fluid  collection/hematoma along the left abdominal wall, partially imaged. 5. Acute fractures of the anterior right second and fourth and the anterior left fourth and fifth ribs. Aortic Atherosclerosis (ICD10-I70.0). Electronically Signed   By: Zerita Boers M.D.   On: 04/12/2019 15:14   Dg Chest Port 1 View  Result Date: 04-28-2019 CLINICAL DATA:  Hypoxia EXAM: PORTABLE CHEST 1 VIEW COMPARISON:  Pad yesterday FINDINGS: Tunneled catheter on the right with tip at the right atrium. Stable enlarged heart and vascular pedicle widening. CABG. Implantable loop recorder. Low volume chest with hazy density at both bases, stable. No pneumothorax. IMPRESSION: Stable opacification of the lower chest from multi segment atelectasis and layering pleural effusions by recent CT. Electronically Signed   By: Monte Fantasia M.D.   On: 28-Apr-2019 04:40   Dg Chest Port 1 View  Result Date: 04/13/2019 CLINICAL DATA:  Acute respiratory failure EXAM: PORTABLE CHEST 1 VIEW COMPARISON:  04/11/2019 FINDINGS: Prior CABG. Cardiomegaly. Vascular congestion. Layering bilateral effusions with bibasilar atelectasis. No acute bony abnormality. IMPRESSION: Cardiomegaly with vascular congestion. Layering bilateral effusions with bibasilar atelectasis. Electronically Signed   By: Rolm Baptise M.D.   On: 04/13/2019 16:38    Cardiac Studies   04/11/19 TTE IMPRESSIONS  1. Left ventricular ejection fraction, by visual estimation, is 45 to 50%. The left ventricle has mildly decreased function. There is not assessed.  2. Global right ventricle was not well visualized.The right ventricular size is normal. Right vetricular wall thickness was not assessed.  3. Left atrial size was mildly dilated.  4. Right atrial size was normal.  5. Severe mitral annular calcification.  6. The mitral valve is degenerative. Trace mitral valve regurgitation.  7. The tricuspid valve is not well visualized. Tricuspid valve regurgitation is trivial.   8. Aortic valve regurgitation was not visualized by color flow Doppler.  9. Mechanical aortic prosthesis. Functioning normally. 10. The pulmonic valve was not well visualized. Pulmonic valve regurgitation is not visualized by color flow Doppler. 11. The inferior vena cava is dilated in size with <50% respiratory variability, suggesting right atrial pressure of 15 mmHg.  Patient Profile     73 y.o. 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 Cardiology consult after cardiac arrest.  Assessment & Plan   Post-Arrest Care Acute on chronic systolic heart failure PEA arrest - ROSC after 10 mins. No change in Echo post. Flat troponin ~240. Rib fractures found post arrest. No ischemic changes on EKG. Echo w/ mildly reduced ejection fraction at 45-50%. Received 40mg  Lasix overnight w/o significant output - Start IV Furosemide 60mg  BID - Trend renal fx - Replete K/Mag as appropriate - Monitor vitals - Telemetry - Daily weights, I&Os  Junctional Bradycardia Continues to have bradycardia on telemetry with hypotension. Resumed on dopamine gtt overnight. - Avoid AVN blocking meds - C/w telemetry - Replete K/Mag as appropriate - EP consult  Sepsis Blood culture negative to date. Currently on ceftaroline. Endocarditis r/o w/ TEE. Leukocytosis improving wbc 16->13.7->13.4. -  Managed per primary  Mechanical Aortic Valve Development of retroperitoneal bleed after supratherapeutic INR on admission. On coumadin at home due to mechanical valve. Anemia stable. 7.4->7.9->7.6->7.6 Off anticoagulation since 10/20, Resumed yesterday 10/26 - C/w heparin - Trend CBC, INR - No evidence of acute bleed at the moment, stop heparin if acutely worsening hgb   For questions or updates, please contact [place] Please consult [email or web address] for  contact info under Cardiology/STEMI.     Signed, Gilberto Better, MD  PGY-2, [facility] Pager: [phone] 05-11-19, 7:08 AM    Attending attestation to follow

## 2019-04-19 NOTE — Progress Notes (Signed)
PCCM Interval Note  Daughter at bedside.  Patient complains of ongoing pain, mostly in his shoulder.  Daughter would like to focus on comfort only.  Discussed transitioning to full comfort care with both patient and daughter.  Patient agrees with daughter.  Ongoing emotional support offered.   P: Full DNR/ DNI  Will stop dopamine (at 2 mcg/kg/min) and levophed (at 2 mcg/ min)- MAP is 84 D/c all abx/ labs/ non comfort medicines Start morphine gtt with prn ativan and robinul  Consider transfer to medical floor in am if patient does not pass tonight     Additional CCT 20 mins  Kennieth Rad, MSN, AGACNP-BC [facility] 20-Apr-2019, 5:38 PM

## 2019-04-19 NOTE — Progress Notes (Signed)
[facility] ROUNDING NOTE   Subjective:   This is a 73 year old gentleman with a history of prior renal transplant 1998 followed by Dr. Jimmy Footman at [facility].  Baseline serum creatinine appears to be about 1.3.  He has a history of hypertension, coronary disease status post CABG 1995.  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  Presented 04/10/2019 with chills tremor.  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  TEE was negative.  He developed a complicated course with retroperitoneal bleed 04/06/2019 requiring transfusion and interruption of anticoagulation.  During this admission he had episodes of sinus bradycardia and an in-hospital cardiac arrest 04/11/2019 with CODE BLUE called.  He was transferred to the unit for symptomatic bradycardia and respiratory distress 04/13/2019.  Blood pressure 91/52 pulse 38 temperature 98.2 O2 sats 98% FiO2 100% BiPAP.  Urine output 950 cc 04/12/2019, 175 cc 04/13/2019.  Sodium 141 potassium 5.7 chloride 102 CO2 25 BUN 60 creatinine 3.33 glucose 102 calcium 7 phosphorus 7.9 magnesium 2.0 WBC 13.4 hemoglobin 8.2 platelets 116  Aspirin 81 mg daily, atorvastatin 80 mg daily, Calcitrol 0.5 mcg daily, Lasix 60 mg twice daily IV, CellCept 1 g twice daily, Protonix 40 mg daily, IV calcium gluconate 2 g IV, ceftaroline 400 mg every 8 hours, dopamine V, Levophed IV    Objective:  Vital signs in last 24 hours:  Temp:  [96.7 F (35.9 C)-98.2 F (36.8 C)] 98.2 F (36.8 C) (10/27 0800) Pulse Rate:  [32-97] 59 (10/27 0900) Resp:  [12-26] 18 (10/27 0900) BP: (73-144)/(45-122) 114/62 (10/27 0900) SpO2:  [81 %-100 %] 100 % (10/27 0900) FiO2 (%):  [100 %] 100 % (10/27 0800) Weight:  [106 kg] 106 kg (10/27 0233)  Weight change: 3.4 kg Filed Weights   04/12/19 0500 04/13/19 0404 Apr 28, 2019 0233  Weight: 101.8 kg 102.6 kg 106 kg    Intake/Output: I/O last 3 completed  shifts: In: 1300 [I.V.:243.4; IV Piggyback:1056.6] Out: 175 [Urine:175]   Intake/Output this shift:  No intake/output data recorded.    HEENT: MMM [place] AT anicteric sclera, orally intubated Neck:  No JVD, no adenopathy CV:  Heart RRR  Lungs:  L/S CTA bilaterally Abd:  abd marked distention with normal BS GU:  Bladder non-palpable Extremities: +2 bilateral lower extremity edema Skin:  No skin rash   Basic Metabolic Panel: Recent Labs  Lab 04/08/19 0332 04/09/19 0500  04/11/19 1508  04/11/19 2144 04/12/19 0130 04/12/19 0518 04/13/19 0353 04-28-2019 0230 04-28-2019 0755 04-28-19 0803  NA 138  --    < >  --    < > 141 140 142 141 139 141 138  K 4.8  --    < >  --    < > 4.8 4.7 4.6 5.0 6.5* 5.9* 5.7*  CL 106  --    < >  --    < > 105 102 104 103 102 102  --   CO2 23  --    < >  --    < > 23 25 25 27 24 25   --   GLUCOSE 141*  --    < >  --    < > 96 125* 113* 108* 139* 135*  --   BUN 34*  --    < >  --    < > 60* 60* 55* 55* 59* 62*  --   CREATININE 1.96*  --    < >  --    < >  2.60* 2.61* 2.41* 2.20* 3.23* 3.33*  --   CALCIUM 6.8*  --    < >  --    < > 6.8* 7.1* 7.4* 6.8* 6.8* 7.0*  --   MG  --  1.9  --  2.0  --  1.7  --   --  1.9 2.0  --   --   PHOS 4.0 4.5  --  7.1*  --   --   --   --  5.4* 7.9*  --   --    < > = values in this interval not displayed.    Liver Function Tests: Recent Labs  Lab 04/08/19 0332 04/09/19 0500 04/11/19 1322  AST 30 33 180*  ALT 20 24 154*  ALKPHOS 70 56 67  BILITOT 1.0 1.0 1.6*  PROT 5.2* 5.4* 5.2*  ALBUMIN 2.7* 2.9* 2.7*   No results for input(s): LIPASE, AMYLASE in the last 168 hours. No results for input(s): AMMONIA in the last 168 hours.  CBC: Recent Labs  Lab 04/11/19 1508  04/11/19 1714 04/12/19 0518 04/13/19 0353 04/13/19 1321 May 03, 2019 0230 2019/05/03 0803  WBC 16.9*  --  15.3* 16.0* 13.7*  --  13.4*  --   NEUTROABS 16.1*  --  13.0*  --   --   --   --   --   HGB 7.5*   < > 7.4* 7.9* 7.6* 8.2* 7.6* 8.2*  HCT 24.4*   < >  23.6* 25.2* 24.7* 27.3* 26.1* 24.0*  MCV 98.4  --  95.2 94.7 97.2  --  100.0  --   PLT 121*  --  132* 142* 136*  --  116*  --    < > = values in this interval not displayed.    Cardiac Enzymes: No results for input(s): CKTOTAL, CKMB, CKMBINDEX, TROPONINI in the last 168 hours.  BNP: Invalid input(s): POCBNP  CBG: Recent Labs  Lab 04/13/19 0649 04/13/19 1125 04/13/19 1600 04/13/19 2151 2019/05/03 0816  GLUCAP 96 149* 112* 144* 130*    Microbiology: Results for orders placed or performed during the hospital encounter of 04/01/2019  SARS CORONAVIRUS 2 (TAT 6-24 HRS) Nasopharyngeal Nasopharyngeal Swab     Status: None   Collection Time: 03/23/2019  6:23 PM   Specimen: Nasopharyngeal Swab  Result Value Ref Range Status   SARS Coronavirus 2 NEGATIVE NEGATIVE Final    Comment: (NOTE) SARS-CoV-2 target nucleic acids are NOT DETECTED. The SARS-CoV-2 RNA is generally detectable in upper and lower respiratory specimens during the acute phase of infection. Negative results do not preclude SARS-CoV-2 infection, do not rule out co-infections with other pathogens, and should not be used as the sole basis for treatment or other patient management decisions. Negative results must be combined with clinical observations, patient history, and epidemiological information. The expected result is Negative. Fact Sheet for Patients: SugarRoll.be Fact Sheet for Healthcare Providers: https://www.woods-mathews.com/ This test is not yet approved or cleared by the [place] FDA and  has been authorized for detection and/or diagnosis of SARS-CoV-2 by FDA under an Emergency Use Authorization (EUA). This EUA will remain  in effect (meaning this test can be used) for the duration of the COVID-19 declaration under Section 56 4(b)(1) of the Act, 21 U.S.C. section 360bbb-3(b)(1), unless the authorization is terminated or revoked sooner. Performed at [facility], [place] [address], [place], [place] [address]   Culture, blood (routine x 2)     Status: None   Collection Time: 04/02/2019  6:47  PM   Specimen: BLOOD RIGHT ARM  Result Value Ref Range Status   Specimen Description BLOOD RIGHT ARM  Final   Special Requests   Final    BOTTLES DRAWN AEROBIC AND ANAEROBIC Blood Culture adequate volume   Culture   Final    NO GROWTH 6 DAYS Performed at [facility], [address], [place], [place] [address]    Report Status 04/02/2019 FINAL  Final  Culture, blood (routine x 2)     Status: None   Collection Time: 03/29/2019  7:00 PM   Specimen: BLOOD RIGHT WRIST  Result Value Ref Range Status   Specimen Description BLOOD RIGHT WRIST  Final   Special Requests   Final    BOTTLES DRAWN AEROBIC AND ANAEROBIC Blood Culture results may not be optimal due to an excessive volume of blood received in culture bottles   Culture   Final    NO GROWTH 6 DAYS Performed at [facility], [place] [address], [place], [place] [address]    Report Status 04/02/2019 FINAL  Final  Urine culture     Status: Abnormal   Collection Time: 04/08/2019 11:15 PM   Specimen: Urine  Result Value Ref Range Status   Specimen Description URINE, RANDOM  Final   Special Requests NONE  Final   Culture (A)  Final    <10,000 COLONIES/mL INSIGNIFICANT GROWTH Performed at [facility], [place] [address], [place], [place] [address]    Report Status 03/29/2019 FINAL  Final  MRSA PCR Screening     Status: None   Collection Time: 03/28/19  8:34 AM   Specimen: Nasal Mucosa; Nasopharyngeal  Result Value Ref Range Status   MRSA by PCR NEGATIVE NEGATIVE Final    Comment:        The GeneXpert MRSA Assay (FDA approved for NASAL specimens only), is one component of a comprehensive MRSA colonization surveillance program. It is not intended to diagnose MRSA infection nor to guide or monitor treatment for MRSA infections. Performed at [facility], [place] [address],  [place], [place] [address]   C difficile quick scan w PCR reflex     Status: None   Collection Time: 03/29/19  2:47 AM   Specimen: STOOL  Result Value Ref Range Status   C Diff antigen NEGATIVE NEGATIVE Final   C Diff toxin NEGATIVE NEGATIVE Final   C Diff interpretation No C. difficile detected.  Final    Comment: Performed at [facility], [place] [address] [place], [place] [address]  GI pathogen panel by PCR, stool     Status: None   Collection Time: 03/29/19  8:17 AM   Specimen: Stool  Result Value Ref Range Status   Plesiomonas shigelloides NOT PERFORMED  Final    Comment: Test not performed   Yersinia enterocolitica NOT PERFORMED  Final    Comment: Test not performed   Vibrio NOT PERFORMED  Final    Comment: Test not performed   Enteropathogenic E coli NOT PERFORMED  Final    Comment: Test not performed   E coli (ETEC) LT/ST NOT PERFORMED  Final    Comment: Test not performed   E coli 0157 by PCR NOT PERFORMED  Final    Comment: Test not performed   Cryptosporidium by PCR NOT PERFORMED  Final    Comment: Test not performed   Entamoeba histolytica NOT PERFORMED  Final    Comment: Test not performed   Adenovirus F 40/41  NOT PERFORMED  Final    Comment: Test not performed   Norovirus GI/GII NOT PERFORMED  Final    Comment: Test not performed   Sapovirus NOT PERFORMED  Final    Comment: (NOTE) Test not performed Performed At: [facility] [place], [place] [id number] Rush Farmer MD [id number]    Vibrio cholerae NOT PERFORMED  Final    Comment: Test not performed   Campylobacter by PCR [id number]  Final    Comment: (NOTE) The specimen submitted does not meet the laboratory's criteria for acceptability. Refer to [employer] [facility] for specimen acceptability criteria.      Required: ParaPak Orange      Received: Sterile Container      Mayra Neer was notified 03/30/2019.    Salmonella by PCR NOT PERFORMED  Final    Comment: Test not  performed   E coli (STEC) NOT PERFORMED  Final    Comment: Test not performed   Enteroaggregative E coli NOT PERFORMED  Final    Comment: Test not performed   Shigella by PCR NOT PERFORMED  Final    Comment: Test not performed   Cyclospora cayetanensis NOT PERFORMED  Final    Comment: Test not performed   Astrovirus NOT PERFORMED  Final    Comment: Test not performed   G lamblia by PCR NOT PERFORMED  Final    Comment: Test not performed   Rotavirus A by PCR NOT PERFORMED  Final    Comment: Test not performed CALLED TO L.KING,RN AT 1732 03/30/19 BY L.PITT  Performed at [facility], [place] [address] [place], [place] [address]     Coagulation Studies: Recent Labs    04/12/19 0518  LABPROT 16.0*  INR 1.3*    Urinalysis: No results for input(s): COLORURINE, LABSPEC, PHURINE, GLUCOSEU, HGBUR, BILIRUBINUR, KETONESUR, PROTEINUR, UROBILINOGEN, NITRITE, LEUKOCYTESUR in the last 72 hours.  Invalid input(s): APPERANCEUR    Imaging: Ct Chest Wo Contrast  Result Date: 04/12/2019 CLINICAL DATA:  Acute respiratory illness EXAM: CT CHEST WITHOUT CONTRAST TECHNIQUE: Multidetector CT imaging of the chest was performed following the standard protocol without IV contrast. COMPARISON:  Chest radiograph dated 04/11/19 FINDINGS: Cardiovascular: A right internal jugular central venous catheter tip terminates at the superior cavoatrial junction. The main pulmonary artery and the left and right pulmonary arteries are enlarged (main pulmonary artery measures 3.2 cm) and likely reflect pulmonary hypertension. Vascular calcifications are seen in the coronary arteries, aortic arch, and abdominal aorta. The heart is mildly enlarged. No pericardial effusion. Mediastinum/Nodes: Prominent paratracheal lymph nodes are likely reactive. No enlarged axillary lymph nodes. The thyroid appears normal. An endotracheal tube terminates in the upper thoracic trachea. Lungs/Pleura: There are moderate bilateral pleural  effusions with associated atelectasis. There is no pneumothorax Upper Abdomen: An enteric tube terminates near the pylorus. A large fluid collection/hematoma along the left abdominal wall is partially imaged. The right kidney is trophic. Abdominal ascites is seen. Musculoskeletal: There are acute fractures of the anterior right second and fourth and the anterior left fourth and fifth ribs. Median sternotomy wires are noted. IMPRESSION: 1. Moderate bilateral pleural effusions with associated atelectasis. 2. Mild cardiomegaly. 3. Enlarged pulmonary arteries, suggesting pulmonary hypertension. 4. Large fluid collection/hematoma along the left abdominal wall, partially imaged. 5. Acute fractures of the anterior right second and fourth and the anterior left fourth and fifth ribs. Aortic Atherosclerosis (ICD10-I70.0). Electronically Signed   By: Zerita Boers M.D.   On: 04/12/2019 15:14   Dg  Chest Port 1 View  Result Date: Apr 16, 2019 CLINICAL DATA:  Hypoxia EXAM: PORTABLE CHEST 1 VIEW COMPARISON:  Pad yesterday FINDINGS: Tunneled catheter on the right with tip at the right atrium. Stable enlarged heart and vascular pedicle widening. CABG. Implantable loop recorder. Low volume chest with hazy density at both bases, stable. No pneumothorax. IMPRESSION: Stable opacification of the lower chest from multi segment atelectasis and layering pleural effusions by recent CT. Electronically Signed   By: Monte Fantasia M.D.   On: 03/30/2019 04:40   Dg Chest Port 1 View  Result Date: 04/13/2019 CLINICAL DATA:  Acute respiratory failure EXAM: PORTABLE CHEST 1 VIEW COMPARISON:  04/11/2019 FINDINGS: Prior CABG. Cardiomegaly. Vascular congestion. Layering bilateral effusions with bibasilar atelectasis. No acute bony abnormality. IMPRESSION: Cardiomegaly with vascular congestion. Layering bilateral effusions with bibasilar atelectasis. Electronically Signed   By: Rolm Baptise M.D.   On: 04/13/2019 16:38     Medications:   .  sodium chloride Stopped (04/12/19 1343)  . ceFTAROline (TEFLARO) IV 400 mg (04/17/2019 0825)  . heparin 1,100 Units/hr (04/15/2019 0700)  . norepinephrine (LEVOPHED) Adult infusion     . aspirin  81 mg Oral Daily  . atorvastatin  80 mg Oral Daily  . calcitRIOL  0.5 mcg Oral Daily  . chlorhexidine  15 mL Mouth Rinse BID  . Chlorhexidine Gluconate Cloth  6 each Topical Daily  . furosemide  60 mg Intravenous BID  . influenza vaccine adjuvanted  0.5 mL Intramuscular Tomorrow-1000  . insulin aspart  0-9 Units Subcutaneous TID WC  . mouth rinse  15 mL Mouth Rinse q12n4p  . mycophenolate  1,000 mg Oral BID  . pantoprazole  40 mg Oral Daily  . pneumococcal 23 valent vaccine  0.5 mL Intramuscular Tomorrow-1000  . sodium chloride flush  10-40 mL Intracatheter Q12H  . sodium chloride flush  10-40 mL Intracatheter Q12H  . Vitamin D (Ergocalciferol)  50,000 Units Oral Q Sun   sodium chloride, fentaNYL (SUBLIMAZE) injection, ipratropium-albuterol, lidocaine (PF), ondansetron **OR** ondansetron (ZOFRAN) IV, sodium chloride flush  Assessment/ Plan:  1. AKI/CKD stage 3 presumably due to ischemic ATN in setting of volume depletion and hypotension. Now with symptomatic bradycardia and acute respiratory distress requiring intubation 1.  Recent renal transplant ultrasound with no evidence of obstruction or increased resistive indices. 2. Renal function initially improved since admission with a peak creatinine of 6 on 04/01/2019.  A nadir creatinine of 1.6 on 04/05/2019.  Since then his creatinine has slowly risen.  He is now decompensated hemodynamically with symptomatic bradycardia volume overload and acute respiratory distress requiring intubation. 3. Discussed case with critical care medicine will start patient on CRRT for acute renal failure with respiratory distress and hyperkalemia 2. Sepsis with hypotension- blood cultures negative to date, TTE with oscillating density in the mitral valve and awaiting  TEEnegative for vegetation. 1. Status post dapto and ceftriaxone per ID. 3. Diarrhea-resolved. 4. Hyperkalemia.  Will place patient on CRRT 5. Metabolic acidosis- improved.    6. Hematuria-Foley 7. Renal transplant from sister only taking cellcept.changed to myfortic due to diarrhea but changed back to cellcept which is what he has been on for years. 8. Hypocalcemia.  Vitamin D level markedly low.  Will replete.  Continue calcitriol.  Being supplemented IV with calcium gluconate as needed.   9. Status post cardiac arrest.  Question respiratory event.  Monitor potassium closely.     LOS: [place] @TODAY @10 :09 AM

## 2019-04-19 NOTE — Progress Notes (Signed)
Pt transferred to the mourge @ 23:13 by RN and NT.

## 2019-04-19 NOTE — Consult Note (Addendum)
ELECTROPHYSIOLOGY CONSULT NOTE    Patient ID: John Barton MRN: [id number], DOB/AGE: [birth year] 73 y.o.  Admit date: 04/02/2019 Date of Consult: 2019/05/13  Primary Physician: Tammi Sou, MD Primary Cardiologist: Doran Clay, NP Electrophysiologist: Caryl Comes  Patient Profile: John Barton is a 73 y.o. male with a history of prior kidney transplant, hypertension, CAD, prior mechanical AVR who presented on 04/07/2019 with chills and tremors.  He has had a very prolonged hospital course.  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 TEE was negative. He also developed retroperitoneal bleed requiring transfusion and interruption of anticoagulation. Also during this admission, he has had periods of sinus bradycardia/junctional rhythm as well as PEA arrest. He is being seen today for the evaluation of bradycardia at the request of Dr Debara Pickett.  HPI:  John Barton is a 73 y.o. male with the above past medical history.  He has been followed closely by Dr Caryl Comes and has been noted to have bradycardia and PVC's in the past. He has undergone evaluation with ILR and holter monitors.  He was not felt to be symptomatic with bradycardia and pacing was not pursued.  Yesterday, he was transferred out of the unit to the floor. On arrival, he developed hypotension and sinus bradycardia as well as respiratory distress and was transferred back to the unit. Overnight, his heart rates have been in the 50's and 60's on dopamine.  He has had worsening respiratory and renal function and is currently on bipap.  On exam, he is complaining of abdominal pain.  He is awake and alert.  Echo 04/11/19 demonstrated EF 45-50%, severe mitral annual calcification, normal mechanical aortic valve.   Past Medical History:  Diagnosis Date   Arthritis    Bifascicular block    First degree AV block, right bundle branch block left anterior fascicular block.  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 Jimmy Footman.  GFR stable at 55 ml/min (Cr 1.28) 07/16/17.  Neph f/u 08/08/17, Cr up to 1.71, GFR 39 (in context of vol ovld & imp bladder emptying b/c out of flomax x 1 wk).  Cr  1.38 and GFR 50 ml/min as of 08/21/17 renal f/u.  Cr 1.27, GFR 56 on renal f/u 10/01/17. Cr 1.24, GFR 57 on 01/06/18. Cr 1.41, GFR 49 Jan 2020. Stab GFR 09/2018    COPD (chronic obstructive pulmonary disease) ([place])    Coronary artery disease    CABG 1995.  Prior inferior MI noted on Myoview 2010   Diabetes mellitus without complication ([place]) 22/0254   New dx 10/15/16 by A1c criteria (6.6%)--recommended nutritionist referral/no meds at initial dx.   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 + Hx of a-fib as well.  Coumadin mgmt --nephrology   History of renal transplant 1988   Hyperlipidemia    Hypertension    Ischemic cardiomyopathy    Ischemic heart disease and mild depressed LV and RV function + DD and mild pulm HTN   Post-transplant erythrocytosis    S/P aortic valve replacement 1995   mechanical (for bicuspid aortic valve)--coumadin managed by nephrology.  //  Echo 2/18: mild LVH, EF 45-50, diff HK, Gr 1 DD, severe MAC, mild Mitral Stenosis, normally functioning mechanical AVR (mean 12, peak 25).  Per cards, repeat echo planned for 2020.   Secondary hyperparathyroidism ([place])    parathyroidectomy done; subsequently has hypoparathyroidism, takes vit D   Sinus bradycardia    Syncope    He is s/p loop recorder insertion; as of Dr. Olin Pia f/u 01/16/16 he has had no further syncope and no significant arrhythmias.  He elected to keep  the loop recorder in and was told to f/u 1 yr with Dr. Caryl Comes.   NORMAL DEVICE REMOTE TRANSMISSION--NO ATRIAL FIBRILLATION--as of 09/02/15.     Surgical History:  Past Surgical History:  Procedure Laterality Date   AORTIC VALVE REPLACEMENT  1995   ST.Jude mechanical valve   CARDIAC CATHETERIZATION  1995   CARDIAC EVENT MONITOR  07/2017   PVCs, sinus brady---no significant abnormal findings.   CORONARY ARTERY BYPASS GRAFT  1995   RCA andLAD   ELECTROPHYSIOLOGY STUDY N/A 09/11/2012   Procedure: ELECTROPHYSIOLOGY STUDY;  Surgeon: Deboraha Sprang, MD;  Location: [facility] CATH LAB;  Service: Cardiovascular;  Laterality: N/A;   IR FLUORO GUIDE CV LINE RIGHT  04/09/2019   IR US GUIDE VASC ACCESS RIGHT  04/09/2019   KIDNEY TRANSPLANT  09/10/1996   (born w/1 kidney).  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  2018: EF 45-50%, normal functioning prosthetic AV, diffuse hypokinesis, grd I DD, mild mitral stenosis.  10/2017: EF 40-45%, global hypokinesis, grd II DD, mild MR, severe LAE, mild RV dysfxn, mild inc pulm press.     Medications Prior to Admission  Medication Sig Dispense Refill Last Dose   allopurinol (ZYLOPRIM) 100 MG tablet Take 100-200 mg by mouth 2 (two) times daily. 1 tablet by mouth in the morning 2 tablets by mouth at night   04/02/2019 at Unknown time   aspirin EC 81 MG tablet Take 1 tablet (81 mg total) by mouth daily. 90 tablet 3 03/26/2019 at Unknown time   atorvastatin (LIPITOR) 80 MG tablet Take 1 tablet (80 mg total) by mouth daily. 90 tablet 3 04/05/2019 at Unknown time   calcitRIOL  (ROCALTROL) 0.5 MCG capsule Take 0.5 mcg by mouth daily.    03/31/2019 at Unknown time   furosemide (LASIX) 40 MG tablet Take 40 mg by mouth daily.   03/20/2019 at Unknown time   lisinopril (PRINIVIL,ZESTRIL) 5 MG tablet Take 5 mg by mouth daily.   03/20/2019 at Unknown time   Multiple Vitamins-Minerals (OCUVITE PO) Take 1 tablet by mouth 2 (two) times a day.    04/04/2019 at Unknown time   mycophenolate (CELLCEPT) 500 MG tablet Take 1,000 mg by mouth 2 (two) times daily.    04/17/2019 at Unknown time   Naproxen Sodium (ALEVE PO) Take 1 tablet by mouth daily as needed (pain).   04/02/2019 at Unknown time   tamsulosin (FLOMAX) 0.4 MG CAPS capsule Take 0.4 mg by mouth daily.    03/20/2019 at Unknown time   warfarin (COUMADIN) 2 MG tablet Take 7 mg by mouth See admin instructions. Tuesday & Thursday patient takes a  total dose of 7  mg. Patient takes with 5 mg to equal total dose of 7 mg.   03/26/2019 at 5p   warfarin (COUMADIN) 5 MG tablet Take 5 mg by mouth See admin instructions. Monday,Wednesday,Friday,Saturday & Sunday patient takes 5 mg.  On Tuesday and Thursday patient takes 7 mg.  10 03/25/2019    Inpatient Medications:   aspirin  81 mg Oral Daily   atorvastatin  80 mg Oral Daily   calcitRIOL  0.5 mcg Oral Daily   chlorhexidine  15 mL Mouth Rinse BID   Chlorhexidine Gluconate Cloth  6 each Topical Daily   influenza vaccine adjuvanted  0.5 mL Intramuscular Tomorrow-1000   insulin aspart  0-9 Units Subcutaneous TID WC   mouth rinse  15 mL Mouth Rinse q12n4p   mycophenolate  1,000 mg Oral BID   pantoprazole  40 mg Oral Daily   pneumococcal 23 valent vaccine  0.5 mL Intramuscular Tomorrow-1000   sodium chloride flush  10-40 mL Intracatheter Q12H   sodium chloride flush  10-40 mL Intracatheter Q12H   Vitamin D (Ergocalciferol)  50,000 Units Oral Q Sun    Allergies:  Allergies  Allergen Reactions   Vancomycin     Acute renal failure and h/o renal transplant. DO NOT GIVE!      Social History   Socioeconomic History   Marital status: Divorced    Spouse name: Not on file   Number of children: 1   Years of education: Not on file   Highest education level: Not on file  Occupational History   Occupation: retired  Scientist, product/process development strain: Not on file   Food insecurity    Worry: Not on file    Inability: Not on file   Transportation needs    Medical: Not on file    Non-medical: Not on file  Tobacco Use   Smoking status: Former Smoker    Types: Cigars    Quit date: 05/29/2014    Years since quitting: 4.8   Smokeless tobacco: Never Used   Tobacco comment: quit cigarettes '06  Substance and Sexual Activity   Alcohol use: Yes    Alcohol/week: 14.0 standard drinks    Types: 14 Cans of beer per week    Comment: 2 beers/daily   Drug use: No   Sexual activity: Not Currently  Lifestyle   Physical activity    Days per week: Not on file    Minutes per session: Not on file   Stress: Not on file  Relationships   Social connections    Talks on phone: Not on file    Gets together: Not on file    Attends religious service: Not on file    Active member of club or organization: Not on file    Attends meetings of clubs or organizations: Not on file    Relationship status: Not on file   Intimate partner violence    Fear of current or ex partner: Not on file    Emotionally abused: Not on file    Physically abused: Not on file    Forced sexual activity: Not on file  Other Topics Concern   Not on file  Social History Narrative   Mr. Magowan lives alone. He is retired. He has 1 grown daughter.     Family History  Problem Relation Age of Onset   Heart failure Father    Heart disease Father    Hyperlipidemia Father    Hypertension Father    Diabetes Father  Arthritis Mother    Heart disease Mother    Hyperlipidemia Mother    Hypertension Mother    Heart failure Mother    Heart disease Brother     Diabetes Daughter    Alcohol abuse Brother    Liver disease Brother        Liver transplant, ETOH     Review of Systems: All other systems reviewed and are otherwise negative except as noted above.  Physical Exam: Vitals:   Apr 21, 2019 0615 21-Apr-2019 0700 2019-04-21 0800 04/21/19 0900  BP: 137/62 (!) 115/94 (!) 144/67 114/62  Pulse: (!) 58 61 67 (!) 59  Resp: _0 Temp:   98.2 F (36.8 C)   TempSrc:   Oral   SpO2: 95% 97% 98% 100%  Weight:      Height:        GEN- The patient is elderly and chronically ill appearing, alert and oriented x 3 today.   HEENT: normocephalic, atraumatic; sclera clear, conjunctiva pink; hearing intact; oropharynx clear; neck supple Lungs- +bipap Heart- Regular rate and rhythm  GI- distended, tender, decreased bowel sounds Extremities- no clubbing, cyanosis, 2+BLE edema MS- no significant deformity or atrophy Skin- warm and dry, no rash or lesion Psych- euthymic mood, full affect Neuro- strength and sensation are intact  Labs:   Lab Results  Component Value Date   WBC 13.4 (H) 21-Apr-2019   HGB 8.2 (L) 2019/04/21   HCT 24.0 (L) 04-21-2019   MCV 100.0 2019/04/21   PLT 116 (L) 21-Apr-2019    Recent Labs  Lab 04/11/19 1322  April 21, 2019 0755 April 21, 2019 0803  NA 140   < > 141 138  K 4.9   < > 5.9* 5.7*  CL 103   < > 102  --   CO2 22   < > 25  --   BUN 59*   < > 62*  --   CREATININE 2.76*   < > 3.33*  --   CALCIUM 6.4*   < > 7.0*  --   PROT 5.2*  --   --   --   BILITOT 1.6*  --   --   --   ALKPHOS 67  --   --   --   ALT 154*  --   --   --   AST 180*  --   --   --   GLUCOSE 286*   < > 135*  --    < > = values in this interval not displayed.      Radiology/Studies: Ct Abdomen Pelvis Wo Contrast  Result Date: 04/06/2019 CLINICAL DATA:  Acute abdominal pain and left flank pain. EXAM: CT ABDOMEN AND PELVIS WITHOUT CONTRAST TECHNIQUE: Multidetector CT imaging of the abdomen and pelvis was performed following the standard protocol without  IV contrast. COMPARISON:  CT scan dated 03/20/2019 FINDINGS: Other: There is a new left-sided large retroperitoneal inhomogeneous fluid collection measuring 22 x 11 x 23 cm extending from the level of the left hemidiaphragm to the left superior iliac crest. In addition, there is an inhomogeneous fluid collection in the left iliacus muscle measuring 9 x 3 x 3 cm extending to the level of the inguinal ligament. These fluid collections could represent subacute hematomas or abscesses. Lower chest: Extensive aortic atherosclerosis and coronary artery calcifications. New small left pleural effusion and tiny right effusion. Slight bibasilar atelectasis, new on the right. Hepatobiliary: Liver parenchyma is normal. Tiny stones in the otherwise normal gallbladder, unchanged. No biliary ductal  dilatation. Pancreas: Normal. Spleen: Normal. Adrenals/Urinary Tract: Adrenal glands are normal. Chronic severely atrophic right kidney. Left kidney is been removed. There is a transplant kidney in the left lower quadrant with no hydronephrosis. There is a 22 mm stone in the bladder, unchanged. Stomach/Bowel: No acute abnormality of the bowel. Sigmoid diverticulosis. There is a small amount of fluid in the left pericolic gutter. 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 Extensive aortic atherosclerosis. No adenopathy. Reproductive: Prostate is unremarkable. Musculoskeletal: There is soft tissue stranding around the muscles of the lateral and posterolateral aspects of the left anterior abdominal wall adjacent to the extensive new fluid collections in the left side of the abdomen. There is new subcutaneous edema in the left flank and lateral aspect of both hips, left greater than right. IMPRESSION: 1. New large inhomogeneous fluid collections in the left side of the abdomen and pelvis as described above. These could represent subacute hematomas or abscesses. 2. New subcutaneous edema in the left flank  and lateral aspect of both hips, left greater than right. 3. 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 Electronically Signed   By: Lorriane Shire M.D.   On: 04/06/2019 12:48   Dg Abd 1 View  Result Date: 04/11/2019 CLINICAL DATA:  Enteric tube placement. EXAM: ABDOMEN - 1 VIEW COMPARISON:  Abdominal x-ray dated April 06, 2019. FINDINGS: Enteric tube looped in the stomach. New mild central small bowel dilatation. Air and stool within the colon. No acute osseous abnormality. No radio-opaque calculi or other significant radiographic abnormality are seen. IMPRESSION: 1. Enteric tube within the stomach. 2. New mild central small bowel dilatation may reflect ileus. Electronically Signed   By: Titus Dubin M.D.   On: 04/11/2019 15:02   Dg Abd 1 View  Result Date: 04/06/2019 CLINICAL DATA:  Left-sided abdominal pain and distention. EXAM: ABDOMEN - 1 VIEW COMPARISON:  None. FINDINGS: Gaseous distension of the stomach. Bowel loops are otherwise unremarkable. No findings to suggest bowel obstruction. No radio-opaque calculi or other significant radiographic abnormality are seen. IMPRESSION: Nonobstructive bowel gas pattern. Gaseous distension of the stomach, nonspecific. Electronically Signed   By: Kerby Moors M.D.   On: 04/06/2019 18:56   Ct Chest Wo Contrast  Result Date: 04/12/2019 CLINICAL DATA:  Acute respiratory illness EXAM: CT CHEST WITHOUT CONTRAST TECHNIQUE: Multidetector CT imaging of the chest was performed following the standard protocol without IV contrast. COMPARISON:  Chest radiograph dated 04/11/19 FINDINGS: Cardiovascular: A right internal jugular central venous catheter tip terminates at the superior cavoatrial junction. The main pulmonary artery and the left and right pulmonary arteries are enlarged (main pulmonary artery measures 3.2 cm) and likely reflect pulmonary hypertension. Vascular calcifications are seen in the coronary arteries, aortic arch, and abdominal  aorta. The heart is mildly enlarged. No pericardial effusion. Mediastinum/Nodes: Prominent paratracheal lymph nodes are likely reactive. No enlarged axillary lymph nodes. The thyroid appears normal. An endotracheal tube terminates in the upper thoracic trachea. Lungs/Pleura: There are moderate bilateral pleural effusions with associated atelectasis. There is no pneumothorax Upper Abdomen: An enteric tube terminates near the pylorus. A large fluid collection/hematoma along the left abdominal wall is partially imaged. The right kidney is trophic. Abdominal ascites is seen. Musculoskeletal: There are acute fractures of the anterior right second and fourth and the anterior left fourth and fifth ribs. Median sternotomy wires are noted. IMPRESSION: 1. Moderate bilateral pleural effusions with associated atelectasis. 2. Mild cardiomegaly. 3. Enlarged pulmonary arteries, suggesting pulmonary hypertension. 4. Large fluid  collection/hematoma along the left abdominal wall, partially imaged. 5. Acute fractures of the anterior right second and fourth and the anterior left fourth and fifth ribs. Aortic Atherosclerosis (ICD10-I70.0). Electronically Signed   By: Zerita Boers M.D.   On: 04/12/2019 15:14   US Renal Transplant W/doppler  Result Date: 04/09/2019 CLINICAL DATA:  Acute kidney injury. LEFT transplant kidney in March 1998. EXAM: ULTRASOUND OF RENAL TRANSPLANT WITH RENAL DOPPLER ULTRASOUND TECHNIQUE: Ultrasound examination of the renal transplant was performed with gray-scale, color and duplex doppler evaluation. COMPARISON:  CT of the abdomen and pelvis on 04/06/2019 FINDINGS: Transplant kidney location: LEFT iliac fossa Transplant Kidney: Renal measurements: 12.7 x 6.5 x 5.2 centimeter = volume: 222.62m. Normal in size and parenchymal echogenicity. No evidence of mass or hydronephrosis. No peri-transplant fluid collection seen. Color flow in the main renal artery:  Present Color flow in the main renal vein:  Present  Duplex Doppler Evaluation: Main Renal Artery Resistive Index: 0.96 Venous waveform in main renal vein:  Present Intrarenal resistive index in upper pole:  0.84 (normal 0.6-0.8; equivocal 0.8-0.9; abnormal >= 0.9) Intrarenal resistive index in lower pole: 0.90 (normal 0.6-0.8; equivocal 0.8-0.9; abnormal >= 0.9) Bladder: Normal for degree of bladder distention. Other findings:  None. IMPRESSION: 1. Normal ultrasound appearance of the transplant kidney in the LEFT iliac fossa. 2. No hydronephrosis. 3. Resistive indices are equivocal. Electronically Signed   By: ENolon NationsM.D.   On: 04/09/2019 19:04   Ct Abdomen Pelvis W Contrast  Result Date: 03/29/2019 CLINICAL DATA:  Fever.  No abdominal complaints. EXAM: CT ABDOMEN AND PELVIS WITH CONTRAST TECHNIQUE: Multidetector CT imaging of the abdomen and pelvis was performed using the standard protocol following bolus administration of intravenous contrast. CONTRAST:  83mOMNIPAQUE IOHEXOL 300 MG/ML  SOLN COMPARISON:  None. FINDINGS: Lower chest: No acute abnormality. Bibasilar atelectasis/scarring. Dense coronary, aortic valve, and mitral valve calcifications. Hepatobiliary: No focal liver abnormality. Tiny gallstones. No gallbladder wall thickening or biliary dilatation. Pancreas: Unremarkable. No pancreatic ductal dilatation or surrounding inflammatory changes. Spleen: Normal in size without focal abnormality. Adrenals/Urinary Tract: The adrenal glands are unremarkable. Absent left kidney. Severely atrophic right kidney. Left lower quadrant transplant kidney demonstrates mild perinephric stranding but is otherwise unremarkable. No hydronephrosis. 1.8 cm bladder calculus. Small left lateral bladder diverticulum. No bladder wall thickening. Stomach/Bowel: Stomach is within normal limits. Appendix appears normal. No evidence of bowel wall thickening, distention, or inflammatory changes. Vascular/Lymphatic: 5.1 cm infrarenal abdominal aortic aneurysm. Extensive  aortoiliac and branch vessel atherosclerotic calcification. No enlarged abdominal or pelvic lymph nodes. Reproductive: Borderline prostatomegaly with coarse central calcifications. Other: Small fat containing umbilical hernia. No free fluid or pneumoperitoneum. Musculoskeletal: No acute or significant osseous findings. Chronic distal sacral fracture centrally at S4-L5. IMPRESSION: 1.  No acute intra-abdominal process. 2. 5.1 cm infrarenal abdominal aortic aneurysm. Recommend followup by abdomen and pelvis CTA in 3-6 months, and vascular surgery referral/consultation if not already obtained. This recommendation follows ACR consensus guidelines: White Paper of the ACR Incidental Findings Committee II on Vascular Findings. J Am Coll Radiol 2013; 10:789-794. Aortic aneurysm NOS (ICD10-I71.9) 3. Left lower quadrant renal transplant with mild perinephric stranding, nonspecific. No hydronephrosis. 4. 1.8 cm bladder calculus. 5. Cholelithiasis. 6.  Aortic atherosclerosis (ICD10-I70.0). Electronically Signed   By: WiTitus Dubin.D.   On: 04/02/2019 22:28   Ir Fluoro Guide Cv Line Right  Result Date: 04/10/2019 INDICATION: Endocarditis, access for long-term antibiotics EXAM: TUNNELED PICC LINE WITH ULTRASOUND AND FLUOROSCOPIC GUIDANCE MEDICATIONS: 1% lidocaine local.  The antibiotic was given in an appropriate time interval prior to skin puncture. ANESTHESIA/SEDATION: Moderate Sedation Time:  None. The patient was continuously monitored during the procedure by the interventional radiology nurse under my direct supervision. FLUOROSCOPY TIME:  Fluoroscopy Time: 0 minutes 12 seconds (1 mGy). COMPLICATIONS: None immediate. PROCEDURE: Informed written consent was obtained from the patient after a discussion of the risks, benefits, and alternatives to treatment. Questions regarding the procedure were encouraged and answered. The right neck and chest were prepped with chlorhexidine in a sterile fashion, and a sterile drape  was applied covering the operative field. Maximum barrier sterile technique with sterile gowns and gloves were used for the procedure. A timeout was performed prior to the initiation of the procedure. After creating a small venotomy incision, a micropuncture kit was utilized to access the right internal jugular vein under direct, real-time ultrasound guidance after the overlying soft tissues were anesthetized with 1% lidocaine with epinephrine. Ultrasound image documentation was performed. The microwire was kinked to measure appropriate catheter length. The micropuncture sheath was exchanged for a peel-away sheath over a guidewire. A 5 French dual lumen tunneled PICC measuring 27 cm was tunneled in a retrograde fashion from the anterior chest wall to the venotomy incision. The catheter was then placed through the peel-away sheath with tip ultimately positioned at the superior caval-atrial junction. Final catheter positioning was confirmed and documented with a spot radiographic image. The catheter aspirates and flushes normally. The catheter was flushed with appropriate volume heparin dwells. The catheter exit site was secured with a 0-Prolene retention suture. The venotomy incision was closed with Dermabond. Dressings were applied. The patient tolerated the procedure well without immediate post procedural complication. FINDINGS: After catheter placement, the tip lies within the superior cavoatrial junction. The catheter aspirates and flushes normally and is ready for immediate use. IMPRESSION: Successful placement of 27cm dual lumen tunneled PICC catheter via the right internal jugular vein with tip terminating at the superior caval atrial junction. The catheter is ready for immediate use. Electronically Signed   By: Jerilynn Mages.  Shick M.D.   On: 04/10/2019 08:04   Ir US Guide Vasc Access Right  Result Date: 04/10/2019 INDICATION: Endocarditis, access for long-term antibiotics EXAM: TUNNELED PICC LINE WITH ULTRASOUND  AND FLUOROSCOPIC GUIDANCE MEDICATIONS: 1% lidocaine local. The antibiotic was given in an appropriate time interval prior to skin puncture. ANESTHESIA/SEDATION: Moderate Sedation Time:  None. The patient was continuously monitored during the procedure by the interventional radiology nurse under my direct supervision. FLUOROSCOPY TIME:  Fluoroscopy Time: 0 minutes 12 seconds (1 mGy). COMPLICATIONS: None immediate. PROCEDURE: Informed written consent was obtained from the patient after a discussion of the risks, benefits, and alternatives to treatment. Questions regarding the procedure were encouraged and answered. The right neck and chest were prepped with chlorhexidine in a sterile fashion, and a sterile drape was applied covering the operative field. Maximum barrier sterile technique with sterile gowns and gloves were used for the procedure. A timeout was performed prior to the initiation of the procedure. After creating a small venotomy incision, a micropuncture kit was utilized to access the right internal jugular vein under direct, real-time ultrasound guidance after the overlying soft tissues were anesthetized with 1% lidocaine with epinephrine. Ultrasound image documentation was performed. The microwire was kinked to measure appropriate catheter length. The micropuncture sheath was exchanged for a peel-away sheath over a guidewire. A 5 French dual lumen tunneled PICC measuring 27 cm was tunneled in a retrograde fashion from the anterior chest wall  to the venotomy incision. The catheter was then placed through the peel-away sheath with tip ultimately positioned at the superior caval-atrial junction. Final catheter positioning was confirmed and documented with a spot radiographic image. The catheter aspirates and flushes normally. The catheter was flushed with appropriate volume heparin dwells. The catheter exit site was secured with a 0-Prolene retention suture. The venotomy incision was closed with Dermabond.  Dressings were applied. The patient tolerated the procedure well without immediate post procedural complication. FINDINGS: After catheter placement, the tip lies within the superior cavoatrial junction. The catheter aspirates and flushes normally and is ready for immediate use. IMPRESSION: Successful placement of 27cm dual lumen tunneled PICC catheter via the right internal jugular vein with tip terminating at the superior caval atrial junction. The catheter is ready for immediate use. Electronically Signed   By: Jerilynn Mages.  Shick M.D.   On: 04/10/2019 08:04   Dg Chest Port 1 View  Result Date: 10-May-2019 CLINICAL DATA:  Hypoxia EXAM: PORTABLE CHEST 1 VIEW COMPARISON:  Pad yesterday FINDINGS: Tunneled catheter on the right with tip at the right atrium. Stable enlarged heart and vascular pedicle widening. CABG. Implantable loop recorder. Low volume chest with hazy density at both bases, stable. No pneumothorax. IMPRESSION: Stable opacification of the lower chest from multi segment atelectasis and layering pleural effusions by recent CT. Electronically Signed   By: Monte Fantasia M.D.   On: 10-May-2019 04:40   Dg Chest Port 1 View  Result Date: 04/13/2019 CLINICAL DATA:  Acute respiratory failure EXAM: PORTABLE CHEST 1 VIEW COMPARISON:  04/11/2019 FINDINGS: Prior CABG. Cardiomegaly. Vascular congestion. Layering bilateral effusions with bibasilar atelectasis. No acute bony abnormality. IMPRESSION: Cardiomegaly with vascular congestion. Layering bilateral effusions with bibasilar atelectasis. Electronically Signed   By: Rolm Baptise M.D.   On: 04/13/2019 16:38   Dg Chest Port 1 View  Result Date: 04/11/2019 CLINICAL DATA:  Unresponsive, EXAM: PORTABLE CHEST 1 VIEW COMPARISON:  04/06/2019 FINDINGS: Endotracheal tube with the tip 2.5 cm above the carina. Bilateral diffuse mild interstitial thickening. No focal consolidation, pleural effusion or pneumothorax. Stable cardiomegaly. Prior median sternotomy. Right  jugular central venous catheter with the tip projecting over the cavoatrial junction. IMPRESSION: 1. Bilateral mild interstitial thickening most concerning for mild interstitial edema. 2. Support lines and tubing in satisfactory position. Electronically Signed   By: Kathreen Devoid   On: 04/11/2019 13:47   Dg Chest Port 1 View  Result Date: 04/06/2019 CLINICAL DATA:  Line placement EXAM: PORTABLE CHEST 1 VIEW COMPARISON:  04/07/2019 FINDINGS: Interval placement of a right neck vascular sheath, tip projecting in the vicinity of the right brachiocephalic vein. Otherwise unchanged AP portable examination with cardiomegaly status post median sternotomy and left basilar scarring or atelectasis. No acute appearing airspace opacity. IMPRESSION: 1. Interval placement of a right neck vascular sheath, tip projecting in the vicinity of the right brachiocephalic vein. 2. Otherwise unchanged AP portable examination with cardiomegaly status post median sternotomy and left basilar scarring or atelectasis. No acute appearing airspace opacity. Electronically Signed   By: Eddie Candle M.D.   On: 04/06/2019 14:27   Dg Chest Portable 1 View  Result Date: 03/25/2019 CLINICAL DATA:  Fever. EXAM: PORTABLE CHEST 1 VIEW COMPARISON:  May 06, 2018 FINDINGS: Cardiomediastinal silhouette is normal. Mediastinal contours appear intact. Calcific atherosclerotic disease of the aorta. Post CABG postsurgical changes. Streaky airspace opacities in the left lung base. Osseous structures are without acute abnormality. Soft tissues are grossly normal. IMPRESSION: 1. Streaky airspace opacities in the left  lung base may represent atelectasis or infectious airspace consolidation. 2. Calcific atherosclerotic disease of the aorta. Electronically Signed   By: Fidela Salisbury M.D.   On: 04/02/2019 18:49    XFQ:HKUVJ rhythm, RBBB, LAFB, intermittent junctional escape beats (personally reviewed)  TELEMETRY: sinus rhythm, sinus brady, PVC's,  intermittent junctional rhythm (personally reviewed)  Assessment/Plan: 1.  Sinus brady/junctional rhythm The patient has longstanding sinus node dysfunction which has been asymptomatic as an outpatient.  He has had recurrent bradycardia this admission associated with hypotension. It is unclear that bradycardia is the cause of his hypotension.   He has significant abdomina pain and distention this morning - question if bradycardia is a vagal response. His infectious risks is quite high for consideration of pacemaker implant even if we thought it would benefit his clinical picture. 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   2.  Abdominal pain ?illeus on last KUB Will repeat this am  3.  Acute on chronic systolic heart failure Remains volume overloaded Management per cardiology team  4.  Mechanical aortic valve Retroperitoneal bleed this admission Hgb stable for last several days   5.  AKI Plan CRRT today per renal   Will have Dr Caryl Comes see him tomorrow who knows him well.   For questions or updates, please contact [place] Please consult [email or web address] for contact info under Cardiology/STEMI.  Signed, Chanetta Marshall, NP 2019-04-22 9:24 AM   EP Attending  Patient seen and examined. Discussed with Dr. Debara Pickett. The patient is well known to the EP service with documented sinus node dysfunction who has not been thought to be symptomatic in the past when he wore an ILR. He was admitted with symptoms and signs of endocarditis but no positive cultures. He has worsening renal insufficiency and failure to thrive and has been noted to have nocturnal bradycardia into the 30's and daytime into the 40's and has also had a PEA arrest and is currently on dopamine. He has a distended abdomen on exam and appears to be chronically ill. I currently do not see compelling evidence that a PPM would be of benefit and certainly not long term  benefit. In addition, there are ongoing talks about the continuation of dialysis support and whether most appropriate for comfort care. As his condition declines he may well develop worsening bradycardia but I do not think pacing either temporary or permanent likely to change his outcome. Dr. Renaldo Reel who knows the patient well will see and make additional rec's tomorrow. With abdominal distention might consider an NG tube to suction.  Mikle Bosworth.D.

## 2019-04-19 NOTE — Progress Notes (Signed)
40 ml Morphine gtt wasted with with Jorene Minors, RN

## 2019-04-19 NOTE — Progress Notes (Signed)
Evaluated pt at bedside before 5am.   Had just been NT suctioned.  No resp distress, requiring NRB at 100% sattign in high 90s.  CXR - B edema, trial of lasix now.  Available for intubation if needed in the interim, but will try to hold off if possible and try to improve him more with diuresis.

## 2019-04-19 NOTE — Progress Notes (Signed)
[facility] Progress Note Patient Name: John Barton DOB: April 11, 1946 MRN: [id number]   Date of Service  04-30-2019  HPI/Events of Note  Hypoxia - Increased O2 requirement, increased WOB and poor cough. Now on 100% NRBM with sat = 90%. Concern that he is headed for intubation.   eICU Interventions  Will order: 1. NT suction PRN. 2. Portable CXR STAT. 3. Will ask ground team to evaluate him at bedside.      Intervention Category Major Interventions: Hypoxemia - evaluation and management  Sommer,Steven Eugene April 30, 2019, 4:04 AM

## 2019-04-19 NOTE — Progress Notes (Signed)
PCCM progress note:  Evaluated pt at the bedside with PCCM attending MD due increased oxygen requirement and concern for respiratory decompensation.  Pt currently on NRBM 100% with tachypnea and rhonchi, though awake and alert and able to answer questions.  Oxygen sats>92%, Has rib fx's and increased secretions.  CXR consistent with volume overload, creatinine up-trending and minimal UOP.  Pt at risk for intubation, secretions too copious for Bipap.  Potassium 6.5.  P: -try to avoid intubation with Lasix 40mg  IVx1 and continue NRBM -treat hyperkalemia with insulin, bicarb, calcium gluconate and obtain EKG, repeat K this morning -NT suction, chest PT -at risk for intubation, monitor closely

## 2019-04-19 NOTE — Progress Notes (Addendum)
Pt T.O.D. 2034. Pt's daughter and husband at bedside.  No hearts sounds heard upon auscultation per 2 RNs (self and Anastasio Auerbach., RN). CCM MD notified.CDS notified- reference number: [id number]. Pt's family has gathered all Pts belongings from room.

## 2019-04-19 NOTE — Progress Notes (Signed)
Patient is having signs of respiratory compromise in the setting of refractory hypoxemia and increase oxygen demands. Patient is currently placed on NIV for respiratory decompensation. Goal is to establish and maintain good ventilation and oxygenation via utilization of NIV using appropriate settings to meet systemic and myocardial oxygen demands to assure adequate tissue perfusion/oxygenation. Patient is alert and oriented at this time but appears to be a little sluggish and fatigue. Patient clinical presentation Is concerning for airway protection possibly in the need of intubation. On arrival to patient room, patient SATs were in the low 80's on a NRB.   Arlenis Blaydes L. Jennette Kettle, RRT, RCP

## 2019-04-19 NOTE — Progress Notes (Signed)
OT Cancellation Note  Patient Details Name: John Barton MRN: [id number] DOB: 12/30/1945   Cancelled Treatment:    Reason Eval/Treat Not Completed: Patient not medically ready.  RN reports transitioning patient to comfort care. OT will sign off.  Please re-consult as needed.  Delight Stare, OT Acute Rehabilitation Services Pager [phone] Office [phone]   Delight Stare May 05, 2019, 10:58 AM

## 2019-04-19 NOTE — Progress Notes (Signed)
PT Cancellation Note  Patient Details Name: John Barton MRN: [id number] DOB: 1945/10/07   Cancelled Treatment:    Reason Eval/Treat Not Completed: Medical issues which prohibited therapy. Per discussion with RN pt with tenuous respiratory status at this time on 100% non-rebreather. Pt also becoming agitated with any attempts to mobilize extremities at this time. PT will attempt to follow up as time allows and when pt is more medically stable.  Zenaida Niece, PT, DPT Acute Rehabilitation Pager: [phone]    Zenaida Niece 04-17-2019, 9:24 AM

## 2019-04-19 NOTE — Progress Notes (Addendum)
Notified Elink MD and Cardiology MD [facility]- Pt oliguric, bradycardic (HR 45-50 bpm) & hypotensive. Verbal orders given to restart Dopamine gtt.   @0400 - Pt increased WOB, O2 Sats 88-90% on 100% NRB Mask. E link & CCM ground team notified by RN. See new orders. RN also notified CCM team of Pt critical -K level (6.5) this AM. E link notified of Hgb 7.6.

## 2019-04-19 NOTE — Plan of Care (Signed)
°  Problem: Coping: °Goal: Level of anxiety will decrease °Outcome: Progressing °  °

## 2019-04-19 NOTE — Progress Notes (Addendum)
ANTICOAGULATION CONSULT NOTE  Pharmacy Consult for Heparin Indication: Afib, mechanical AVR   Patient Measurements: Height: 5\' 10"  (177.8 cm) Weight: 233 lb 11 oz (106 kg) IBW/kg (Calculated) : 73 Heparin dosing weight 86 kg  Vital Signs: Temp: 97.5 F (36.4 C) (10/26 2339) Temp Source: Oral (10/26 2339) BP: 85/70 (10/27 0345) Pulse Rate: 56 (10/27 0345)  Labs: Recent Labs    04/12/19 0130 04/12/19 0518  04/12/19 1300 04/12/19 1953 04/13/19 0353 04/13/19 1321 04/13/19 1823 16-Apr-2019 0230  HGB  --  7.9*  --   --   --  7.6* 8.2*  --  7.6*  HCT  --  25.2*  --   --   --  24.7* 27.3*  --  26.1*  PLT  --  142*  --   --   --  136*  --   --  PENDING  LABPROT  --  16.0*  --   --   --   --   --   --   --   INR  --  1.3*  --   --   --   --   --   --   --   HEPARINUNFRC  --   --   --   --   --   --   --  0.14* 0.35  CREATININE 2.61* 2.41*  --   --   --  2.20*  --   --   --   TROPONINIHS 271*  --    < > 263* 264* 246*  --   --   --    < > = values in this interval not displayed.    Estimated Creatinine Clearance: 35.4 mL/min (A) (by C-G formula based on SCr of 2.2 mg/dL (H)).  Assessment: 73 y.o. male with h/o AVR and Afib, Coumadin on hold, for heparin  Goal of Therapy:  Heparin level 0.3-0.5 Monitor platelets by anticoagulation protocol: Yes   Plan:  Continue Heparin at current rate   Phillis Knack, PharmD, BCPS  Apr 16, 2019 4:03 AM

## 2019-04-19 DEATH — deceased

## 2019-04-21 NOTE — Telephone Encounter (Signed)
Received original D/C signed, funeral home notified for pick up.

## 2019-05-11 ENCOUNTER — Inpatient Hospital Stay: Payer: Medicare Other | Admitting: Family

## 2019-05-19 NOTE — Discharge Summary (Signed)
Physician Death Summary  Patient ID: John Barton MRN: [id number] DOB/AGE: 10/06/1945 73 y.o.  Admit date: 03/19/2019 Discharge date: 04-23-2019  Admission Diagnoses: Abdominal pain  Discharge Diagnoses:  Acute respiratory failure pulmonary edema Acute renal failure with hyperkalemia Cardiac arrest secondary to hyperkalemia Cardiogenic shock, mechanical valve on anticoagulation RP bleed while on anticoagulation  Discharged Condition: Deceased  Hospital Course:  73 y.o.malewith PMH significant for HTN, HLD, DM2, COPD, not on home oxygen, CAD s/p CABG [id number], combined systolic and diastolic CHF (EF 40 to [name] in May 2019), sinus bradycardia s/p ICD in place mechanical aortic valve on Coumadin since 1995, renal transplantation 1998.    03/29/2019 - admit - Admitted on 10/10, febrile, getting treated for presumed endocarditis.   Cultures negative, TEE eventually was able to be done and was negative for vegetations.    10/19 - Transferred to ICU 10/19 with RP hemorhage ( On coumadin at home, was supratheraputic, coumadin reversed, now was getting bridged back to ac with coumadin, had been on heparin infusion.  Developed hypotension with drop in H/H, CT abd done, large RP hematoma, heterogenous.  ) HGB Stabilized after 6 u prbc and 2 u ffp given rapidly. Reversed with protamine, also given Kcentra and vitamin K  04/08/2019-Presumed endocarditis -since he is high risk, ID is recommending long-term ceftaroline with end date 05/15/2019. Transferred out of ICU 10/21  10/24: In Hospital cardiac arrest.Thought to be due to high K (frfom RP blood resoprtion) despite K Rx  10/26-transfer out of ICU then came back hypotensive and bradycardic on low dose dopamine   Had extensive discussion with daughter over telephone and at bedside.  Explained that he is deteriorating and will need to go on life support reviewed continue current level of care.To continue full support we would need to  intubate him and initiation CRRT for volume removal and correction of hyperkalemia with central line, arterial line placement. Given his chronic illnesses and prolonged stay with recent cardiac arrest his prognosis for meaningful recovery is very poor.  [name] felt that patient and family would not want to continue aggressive therapy and requested comfort measures.  He was terminally extubated later in the day and passed away shortly thereafter.  SignedMarshell Garfinkel 04/21/2019, 5:49 PM

## 2020-02-02 IMAGING — DX DG ABDOMEN 1V
1 series · 1 of 1 positions shown · non-contrast
Comparison: None.

CLINICAL DATA: Left-sided abdominal pain and distention.

EXAM:
ABDOMEN - 1 VIEW

[abdomen]
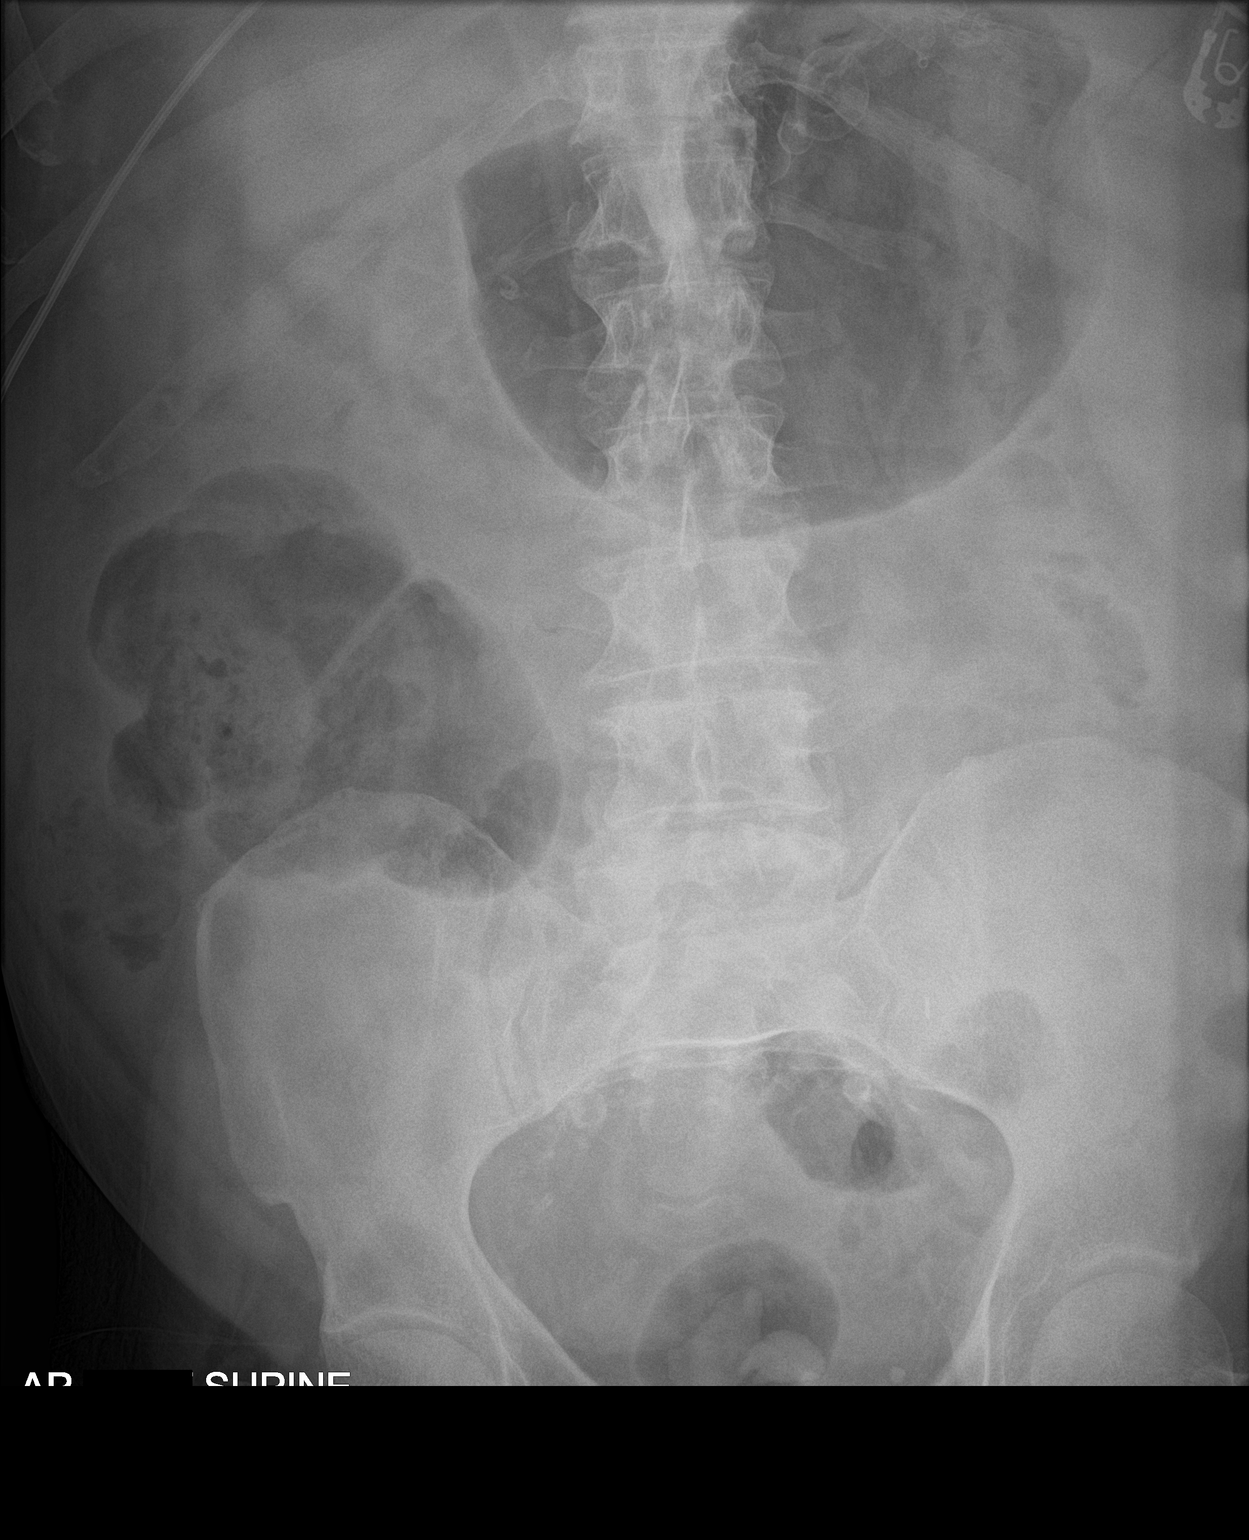

[1 of 1 positions shown; findings below may reference images not displayed]

FINDINGS: Gaseous distension of the stomach. Bowel loops are otherwise
unremarkable. No findings to suggest bowel obstruction. No
radio-opaque calculi or other significant radiographic abnormality
are seen.
IMPRESSION: Nonobstructive bowel gas pattern.

Gaseous distension of the stomach, nonspecific.
